# Patient Record
Sex: Female | Born: 1980 | Race: White | Hispanic: No | Marital: Married | Smoking: Never smoker
Health system: Southern US, Community
[De-identification: ages and names within clinical notes are randomized; demographics above are authoritative.]

## PROBLEM LIST (undated history)

## (undated) DIAGNOSIS — Z9289 Personal history of other medical treatment: Secondary | ICD-10-CM

## (undated) DIAGNOSIS — E785 Hyperlipidemia, unspecified: Secondary | ICD-10-CM

## (undated) DIAGNOSIS — L309 Dermatitis, unspecified: Secondary | ICD-10-CM

## (undated) DIAGNOSIS — F419 Anxiety disorder, unspecified: Secondary | ICD-10-CM

## (undated) DIAGNOSIS — R21 Rash and other nonspecific skin eruption: Secondary | ICD-10-CM

## (undated) DIAGNOSIS — D219 Benign neoplasm of connective and other soft tissue, unspecified: Secondary | ICD-10-CM

## (undated) DIAGNOSIS — R000 Tachycardia, unspecified: Secondary | ICD-10-CM

## (undated) DIAGNOSIS — R0602 Shortness of breath: Secondary | ICD-10-CM

## (undated) DIAGNOSIS — N979 Female infertility, unspecified: Secondary | ICD-10-CM

## (undated) DIAGNOSIS — R5383 Other fatigue: Secondary | ICD-10-CM

## (undated) DIAGNOSIS — E559 Vitamin D deficiency, unspecified: Secondary | ICD-10-CM

## (undated) DIAGNOSIS — K219 Gastro-esophageal reflux disease without esophagitis: Secondary | ICD-10-CM

## (undated) DIAGNOSIS — L853 Xerosis cutis: Secondary | ICD-10-CM

## (undated) DIAGNOSIS — H04123 Dry eye syndrome of bilateral lacrimal glands: Secondary | ICD-10-CM

## (undated) DIAGNOSIS — R002 Palpitations: Secondary | ICD-10-CM

## (undated) DIAGNOSIS — R062 Wheezing: Secondary | ICD-10-CM

## (undated) DIAGNOSIS — R7303 Prediabetes: Secondary | ICD-10-CM

## (undated) DIAGNOSIS — R12 Heartburn: Secondary | ICD-10-CM

## (undated) DIAGNOSIS — E119 Type 2 diabetes mellitus without complications: Secondary | ICD-10-CM

## (undated) DIAGNOSIS — F439 Reaction to severe stress, unspecified: Secondary | ICD-10-CM

## (undated) DIAGNOSIS — I10 Essential (primary) hypertension: Secondary | ICD-10-CM

## (undated) DIAGNOSIS — J45909 Unspecified asthma, uncomplicated: Secondary | ICD-10-CM

## (undated) HISTORY — DX: Tachycardia, unspecified: R00.0

## (undated) HISTORY — DX: Vitamin D deficiency, unspecified: E55.9

## (undated) HISTORY — DX: Benign neoplasm of connective and other soft tissue, unspecified: D21.9

## (undated) HISTORY — DX: Rash and other nonspecific skin eruption: R21

## (undated) HISTORY — DX: Dermatitis, unspecified: L30.9

## (undated) HISTORY — DX: Reaction to severe stress, unspecified: F43.9

## (undated) HISTORY — DX: Essential (primary) hypertension: I10

## (undated) HISTORY — DX: Dry eye syndrome of bilateral lacrimal glands: H04.123

## (undated) HISTORY — DX: Heartburn: R12

## (undated) HISTORY — DX: Wheezing: R06.2

## (undated) HISTORY — DX: Palpitations: R00.2

## (undated) HISTORY — DX: Shortness of breath: R06.02

## (undated) HISTORY — DX: Type 2 diabetes mellitus without complications: E11.9

## (undated) HISTORY — DX: Hyperlipidemia, unspecified: E78.5

## (undated) HISTORY — DX: Xerosis cutis: L85.3

## (undated) HISTORY — DX: Female infertility, unspecified: N97.9

## (undated) HISTORY — DX: Unspecified asthma, uncomplicated: J45.909

## (undated) HISTORY — DX: Other fatigue: R53.83

## (undated) HISTORY — DX: Anxiety disorder, unspecified: F41.9

---

## 2004-06-05 HISTORY — PX: EYE SURGERY: SHX253

## 2015-03-03 ENCOUNTER — Telehealth: Payer: Self-pay | Admitting: Behavioral Health

## 2015-03-03 ENCOUNTER — Telehealth: Payer: Self-pay

## 2015-03-03 NOTE — Telephone Encounter (Signed)
Unable to reach patient at time of Pre-Visit Call.  Left message for patient to return call when available.    

## 2015-03-03 NOTE — Telephone Encounter (Signed)
Unable to reach patient for Pre-visit information.

## 2015-03-04 ENCOUNTER — Ambulatory Visit (INDEPENDENT_AMBULATORY_CARE_PROVIDER_SITE_OTHER): Payer: BC Managed Care – PPO | Admitting: Family Medicine

## 2015-03-04 ENCOUNTER — Encounter: Payer: Self-pay | Admitting: Family Medicine

## 2015-03-04 ENCOUNTER — Telehealth: Payer: Self-pay | Admitting: Family Medicine

## 2015-03-04 VITALS — BP 122/88 | HR 100 | Temp 98.3°F | Ht 62.0 in | Wt 275.4 lb

## 2015-03-04 DIAGNOSIS — L309 Dermatitis, unspecified: Secondary | ICD-10-CM | POA: Insufficient documentation

## 2015-03-04 DIAGNOSIS — Z0000 Encounter for general adult medical examination without abnormal findings: Secondary | ICD-10-CM

## 2015-03-04 LAB — POCT URINALYSIS DIPSTICK
Bilirubin, UA: NEGATIVE
GLUCOSE UA: NEGATIVE
Ketones, UA: NEGATIVE
Leukocytes, UA: NEGATIVE
NITRITE UA: NEGATIVE
PH UA: 6.5
PROTEIN UA: NEGATIVE
RBC UA: NEGATIVE
Spec Grav, UA: 1.02
UROBILINOGEN UA: 0.2

## 2015-03-04 MED ORDER — NORGESTIMATE-ETH ESTRADIOL 0.25-35 MG-MCG PO TABS: 1.0000 | ORAL_TABLET | Freq: Every day | ORAL | Status: DC

## 2015-03-04 MED ORDER — PAROXETINE HCL 10 MG PO TABS: 10.0000 mg | ORAL_TABLET | Freq: Every day | ORAL | Status: DC

## 2015-03-04 MED ORDER — TRIAMCINOLONE ACETONIDE 0.1 % EX CREA: 1.0000 "application " | TOPICAL_CREAM | Freq: Two times a day (BID) | CUTANEOUS | Status: DC

## 2015-03-04 MED ORDER — ALBUTEROL SULFATE 108 (90 BASE) MCG/ACT IN AEPB: 2.0000 | INHALATION_SPRAY | Freq: Four times a day (QID) | RESPIRATORY_TRACT | Status: DC | PRN

## 2015-03-04 NOTE — Telephone Encounter (Signed)
Pt said she forgot to tell you she is taking OTC Vitamin D supplement. Takes 1/day. She didn't remember the mg. She started taking a couple years ago based on previous labs.

## 2015-03-04 NOTE — Progress Notes (Signed)
Subjective:     Jeanne [name] is a 34 y.o. female and is here for a comprehensive physical exam. The patient reports no problems.  Social History   Social History  . Marital Status: Married    Spouse Name: N/A  . Number of Children: N/A  . Years of Education: N/A   Occupational History  . Not on file.   Social History Main Topics  . Smoking status: Never Smoker   . Smokeless tobacco: Not on file  . Alcohol Use: No  . Drug Use: No  . Sexual Activity: Not on file   Other Topics Concern  . Not on file   Social History Narrative  . No narrative on file   Health Maintenance  Topic Date Due  . HIV Screening  11/20/1997  . TETANUS/TDAP  11/20/2001  . INFLUENZA VACCINE  01/04/2015  . PAP SMEAR  06/05/2016    The following portions of the patient's history were reviewed and updated as appropriate:  She  has no past medical history on file. She  does not have any pertinent problems on file. She  has past surgical history that includes Eye surgery (Bilateral, 2006). Her family history includes Cancer in her maternal grandfather, maternal grandmother, and paternal grandfather; Deep vein thrombosis in her mother; Fibroids in her mother; Thyroid disease in her mother. She  reports that she has never smoked. She does not have any smokeless tobacco history on file. She reports that she does not drink alcohol or use illicit drugs. She has a current medication list which includes the following prescription(s): albuterol sulfate, cholecalciferol, norgestimate-ethinyl estradiol, paroxetine, and triamcinolone cream. No current outpatient prescriptions on file prior to visit.   No current facility-administered medications on file prior to visit.   She has No Known Allergies..  Review of Systems Review of Systems  Constitutional: Negative for activity change, appetite change and fatigue.  HENT: Negative for hearing loss, congestion, tinnitus and ear discharge.  dentist q53m Eyes:  Negative for visual disturbance (see optho q1y -- vision corrected to 20/20 with glasses).  Respiratory: Negative for cough, chest tightness and shortness of breath.   Cardiovascular: Negative for chest pain, palpitations and leg swelling.  Gastrointestinal: Negative for abdominal pain, diarrhea, constipation and abdominal distention.  Genitourinary: Negative for urgency, frequency, decreased urine volume and difficulty urinating.  Musculoskeletal: Negative for back pain, arthralgias and gait problem.  Skin: Negative for color change, pallor and rash.  Neurological: Negative for dizziness, light-headedness, numbness and headaches.  Hematological: Negative for adenopathy. Does not bruise/bleed easily.  Psychiatric/Behavioral: Negative for suicidal ideas, confusion, sleep disturbance, self-injury, dysphoric mood, decreased concentration and agitation.       Objective:    BP 122/88 mmHg  Pulse 100  Temp(Src) 98.3 F (36.8 C) (Oral)  Ht 5\' 2"  (1.575 m)  Wt 275 lb 6.4 oz (124.921 kg)  BMI 50.36 kg/m2  SpO2 98%  LMP 02/24/2015 General appearance: alert, cooperative, appears stated age and no distress Head: Normocephalic, without obvious abnormality, atraumatic Eyes: conjunctivae/corneas clear. PERRL, EOM's intact. Fundi benign. Ears: normal TM's and external ear canals both ears Nose: Nares normal. Septum midline. Mucosa normal. No drainage or sinus tenderness. Throat: lips, mucosa, and tongue normal; teeth and gums normal Neck: no adenopathy, no carotid bruit, no JVD, supple, symmetrical, trachea midline and thyroid not enlarged, symmetric, no tenderness/mass/nodules Back: symmetric, no curvature. ROM normal. No CVA tenderness. Lungs: clear to auscultation bilaterally Breasts: normal appearance, no masses or tenderness Heart: regular rate and rhythm, S1,  S2 normal, no murmur, click, rub or gallop Abdomen: soft, non-tender; bowel sounds normal; no masses,  no organomegaly Pelvic:  cervix normal in appearance, external genitalia normal, no adnexal masses or tenderness, no cervical motion tenderness, rectovaginal septum normal, uterus normal size, shape, and consistency and vagina normal without discharge Extremities: extremities normal, atraumatic, no cyanosis or edema Pulses: 2+ and symmetric Skin: Skin color, texture, turgor normal. No rashes or lesions Lymph nodes: Cervical, supraclavicular, and axillary nodes normal. Neurologic: Alert and oriented X 3, normal strength and tone. Normal symmetric reflexes. Normal coordination and gait Psych-- no depression, noanxiety      Assessment:    Healthy female exam.      Plan:    ghm utd Check labs See After Visit Summary for Counseling Recommendations    1. Preventative health care See above - CBC with Differential/Platelet - Comprehensive metabolic panel - Lipid panel - TSH - POCT urinalysis dipstick

## 2015-03-04 NOTE — Telephone Encounter (Signed)
Vitamin D added to medication list.

## 2015-03-04 NOTE — Progress Notes (Signed)
Pre visit review using our clinic review tool, if applicable. No additional management support is needed unless otherwise documented below in the visit note. 

## 2015-03-04 NOTE — Patient Instructions (Signed)
Preventive Care for Adults A healthy lifestyle and preventive care can promote health and wellness. Preventive health guidelines for women include the following key practices.  A routine yearly physical is a good way to check with your health care provider about your health and preventive screening. It is a chance to share any concerns and updates on your health and to receive a thorough exam.  Visit your dentist for a routine exam and preventive care every 6 months. Brush your teeth twice a day and floss once a day. Good oral hygiene prevents tooth decay and gum disease.  The frequency of eye exams is based on your age, health, family medical history, use of contact lenses, and other factors. Follow your health care provider's recommendations for frequency of eye exams.  Eat a healthy diet. Foods like vegetables, fruits, whole grains, low-fat dairy products, and lean protein foods contain the nutrients you need without too many calories. Decrease your intake of foods high in solid fats, added sugars, and salt. Eat the right amount of calories for you.Get information about a proper diet from your health care provider, if necessary.  Regular physical exercise is one of the most important things you can do for your health. Most adults should get at least 150 minutes of moderate-intensity exercise (any activity that increases your heart rate and causes you to sweat) each week. In addition, most adults need muscle-strengthening exercises on 2 or more days a week.  Maintain a healthy weight. The body mass index (BMI) is a screening tool to identify possible weight problems. It provides an estimate of body fat based on height and weight. Your health care provider can find your BMI and can help you achieve or maintain a healthy weight.For adults 20 years and older:  A BMI below 18.5 is considered underweight.  A BMI of 18.5 to 24.9 is normal.  A BMI of 25 to 29.9 is considered overweight.  A BMI of  30 and above is considered obese.  Maintain normal blood lipids and cholesterol levels by exercising and minimizing your intake of saturated fat. Eat a balanced diet with plenty of fruit and vegetables. Blood tests for lipids and cholesterol should begin at age 76 and be repeated every 5 years. If your lipid or cholesterol levels are high, you are over 50, or you are at high risk for heart disease, you may need your cholesterol levels checked more frequently.Ongoing high lipid and cholesterol levels should be treated with medicines if diet and exercise are not working.  If you smoke, find out from your health care provider how to quit. If you do not use tobacco, do not start.  Lung cancer screening is recommended for adults aged 22-80 years who are at high risk for developing lung cancer because of a history of smoking. A yearly low-dose CT scan of the lungs is recommended for people who have at least a 30-pack-year history of smoking and are a current smoker or have quit within the past 15 years. A pack year of smoking is smoking an average of 1 pack of cigarettes a day for 1 year (for example: 1 pack a day for 30 years or 2 packs a day for 15 years). Yearly screening should continue until the smoker has stopped smoking for at least 15 years. Yearly screening should be stopped for people who develop a health problem that would prevent them from having lung cancer treatment.  If you are pregnant, do not drink alcohol. If you are breastfeeding,  be very cautious about drinking alcohol. If you are not pregnant and choose to drink alcohol, do not have more than 1 drink per day. One drink is considered to be 12 ounces (355 mL) of beer, 5 ounces (148 mL) of wine, or 1.5 ounces (44 mL) of liquor.  Avoid use of street drugs. Do not share needles with anyone. Ask for help if you need support or instructions about stopping the use of drugs.  High blood pressure causes heart disease and increases the risk of  stroke. Your blood pressure should be checked at least every 1 to 2 years. Ongoing high blood pressure should be treated with medicines if weight loss and exercise do not work.  If you are 75-52 years old, ask your health care provider if you should take aspirin to prevent strokes.  Diabetes screening involves taking a blood sample to check your fasting blood sugar level. This should be done once every 3 years, after age 15, if you are within normal weight and without risk factors for diabetes. Testing should be considered at a younger age or be carried out more frequently if you are overweight and have at least 1 risk factor for diabetes.  Breast cancer screening is essential preventive care for women. You should practice "breast self-awareness." This means understanding the normal appearance and feel of your breasts and may include breast self-examination. Any changes detected, no matter how small, should be reported to a health care provider. Women in their 58s and 30s should have a clinical breast exam (CBE) by a health care provider as part of a regular health exam every 1 to 3 years. After age 16, women should have a CBE every year. Starting at age 53, women should consider having a mammogram (breast X-ray test) every year. Women who have a family history of breast cancer should talk to their health care provider about genetic screening. Women at a high risk of breast cancer should talk to their health care providers about having an MRI and a mammogram every year.  Breast cancer gene (BRCA)-related cancer risk assessment is recommended for women who have family members with BRCA-related cancers. BRCA-related cancers include breast, ovarian, tubal, and peritoneal cancers. Having family members with these cancers may be associated with an increased risk for harmful changes (mutations) in the breast cancer genes BRCA1 and BRCA2. Results of the assessment will determine the need for genetic counseling and  BRCA1 and BRCA2 testing.  Routine pelvic exams to screen for cancer are no longer recommended for nonpregnant women who are considered low risk for cancer of the pelvic organs (ovaries, uterus, and vagina) and who do not have symptoms. Ask your health care provider if a screening pelvic exam is right for you.  If you have had past treatment for cervical cancer or a condition that could lead to cancer, you need Pap tests and screening for cancer for at least 20 years after your treatment. If Pap tests have been discontinued, your risk factors (such as having a new sexual partner) need to be reassessed to determine if screening should be resumed. Some women have medical problems that increase the chance of getting cervical cancer. In these cases, your health care provider may recommend more frequent screening and Pap tests.  The HPV test is an additional test that may be used for cervical cancer screening. The HPV test looks for the virus that can cause the cell changes on the cervix. The cells collected during the Pap test can be  tested for HPV. The HPV test could be used to screen women aged 30 years and older, and should be used in women of any age who have unclear Pap test results. After the age of 30, women should have HPV testing at the same frequency as a Pap test.  Colorectal cancer can be detected and often prevented. Most routine colorectal cancer screening begins at the age of 50 years and continues through age 75 years. However, your health care provider may recommend screening at an earlier age if you have risk factors for colon cancer. On a yearly basis, your health care provider may provide home test kits to check for hidden blood in the stool. Use of a small camera at the end of a tube, to directly examine the colon (sigmoidoscopy or colonoscopy), can detect the earliest forms of colorectal cancer. Talk to your health care provider about this at age 50, when routine screening begins. Direct  exam of the colon should be repeated every 5-10 years through age 75 years, unless early forms of pre-cancerous polyps or small growths are found.  People who are at an increased risk for hepatitis B should be screened for this virus. You are considered at high risk for hepatitis B if:  You were born in a country where hepatitis B occurs often. Talk with your health care provider about which countries are considered high risk.  Your parents were born in a high-risk country and you have not received a shot to protect against hepatitis B (hepatitis B vaccine).  You have HIV or AIDS.  You use needles to inject street drugs.  You live with, or have sex with, someone who has hepatitis B.  You get hemodialysis treatment.  You take certain medicines for conditions like cancer, organ transplantation, and autoimmune conditions.  Hepatitis C blood testing is recommended for all people born from 1945 through 1965 and any individual with known risks for hepatitis C.  Practice safe sex. Use condoms and avoid high-risk sexual practices to reduce the spread of sexually transmitted infections (STIs). STIs include gonorrhea, chlamydia, syphilis, trichomonas, herpes, HPV, and human immunodeficiency virus (HIV). Herpes, HIV, and HPV are viral illnesses that have no cure. They can result in disability, cancer, and death.  You should be screened for sexually transmitted illnesses (STIs) including gonorrhea and chlamydia if:  You are sexually active and are younger than 24 years.  You are older than 24 years and your health care provider tells you that you are at risk for this type of infection.  Your sexual activity has changed since you were last screened and you are at an increased risk for chlamydia or gonorrhea. Ask your health care provider if you are at risk.  If you are at risk of being infected with HIV, it is recommended that you take a prescription medicine daily to prevent HIV infection. This is  called preexposure prophylaxis (PrEP). You are considered at risk if:  You are a heterosexual woman, are sexually active, and are at increased risk for HIV infection.  You take drugs by injection.  You are sexually active with a partner who has HIV.  Talk with your health care provider about whether you are at high risk of being infected with HIV. If you choose to begin PrEP, you should first be tested for HIV. You should then be tested every 3 months for as long as you are taking PrEP.  Osteoporosis is a disease in which the bones lose minerals and strength   with aging. This can result in serious bone fractures or breaks. The risk of osteoporosis can be identified using a bone density scan. Women ages 65 years and over and women at risk for fractures or osteoporosis should discuss screening with their health care providers. Ask your health care provider whether you should take a calcium supplement or vitamin D to reduce the rate of osteoporosis.  Menopause can be associated with physical symptoms and risks. Hormone replacement therapy is available to decrease symptoms and risks. You should talk to your health care provider about whether hormone replacement therapy is right for you.  Use sunscreen. Apply sunscreen liberally and repeatedly throughout the day. You should seek shade when your shadow is shorter than you. Protect yourself by wearing long sleeves, pants, a wide-brimmed hat, and sunglasses year round, whenever you are outdoors.  Once a month, do a whole body skin exam, using a mirror to look at the skin on your back. Tell your health care provider of new moles, moles that have irregular borders, moles that are larger than a pencil eraser, or moles that have changed in shape or color.  Stay current with required vaccines (immunizations).  Influenza vaccine. All adults should be immunized every year.  Tetanus, diphtheria, and acellular pertussis (Td, Tdap) vaccine. Pregnant women should  receive 1 dose of Tdap vaccine during each pregnancy. The dose should be obtained regardless of the length of time since the last dose. Immunization is preferred during the 27th-36th week of gestation. An adult who has not previously received Tdap or who does not know her vaccine status should receive 1 dose of Tdap. This initial dose should be followed by tetanus and diphtheria toxoids (Td) booster doses every 10 years. Adults with an unknown or incomplete history of completing a 3-dose immunization series with Td-containing vaccines should begin or complete a primary immunization series including a Tdap dose. Adults should receive a Td booster every 10 years.  Varicella vaccine. An adult without evidence of immunity to varicella should receive 2 doses or a second dose if she has previously received 1 dose. Pregnant females who do not have evidence of immunity should receive the first dose after pregnancy. This first dose should be obtained before leaving the health care facility. The second dose should be obtained 4-8 weeks after the first dose.  Human papillomavirus (HPV) vaccine. Females aged 13-26 years who have not received the vaccine previously should obtain the 3-dose series. The vaccine is not recommended for use in pregnant females. However, pregnancy testing is not needed before receiving a dose. If a female is found to be pregnant after receiving a dose, no treatment is needed. In that case, the remaining doses should be delayed until after the pregnancy. Immunization is recommended for any person with an immunocompromised condition through the age of 26 years if she did not get any or all doses earlier. During the 3-dose series, the second dose should be obtained 4-8 weeks after the first dose. The third dose should be obtained 24 weeks after the first dose and 16 weeks after the second dose.  Zoster vaccine. One dose is recommended for adults aged 60 years or older unless certain conditions are  present.  Measles, mumps, and rubella (MMR) vaccine. Adults born before 1957 generally are considered immune to measles and mumps. Adults born in 1957 or later should have 1 or more doses of MMR vaccine unless there is a contraindication to the vaccine or there is laboratory evidence of immunity to   each of the three diseases. A routine second dose of MMR vaccine should be obtained at least 28 days after the first dose for students attending postsecondary schools, health care workers, or international travelers. People who received inactivated measles vaccine or an unknown type of measles vaccine during 1963-1967 should receive 2 doses of MMR vaccine. People who received inactivated mumps vaccine or an unknown type of mumps vaccine before 1979 and are at high risk for mumps infection should consider immunization with 2 doses of MMR vaccine. For females of childbearing age, rubella immunity should be determined. If there is no evidence of immunity, females who are not pregnant should be vaccinated. If there is no evidence of immunity, females who are pregnant should delay immunization until after pregnancy. Unvaccinated health care workers born before 1957 who lack laboratory evidence of measles, mumps, or rubella immunity or laboratory confirmation of disease should consider measles and mumps immunization with 2 doses of MMR vaccine or rubella immunization with 1 dose of MMR vaccine.  Pneumococcal 13-valent conjugate (PCV13) vaccine. When indicated, a person who is uncertain of her immunization history and has no record of immunization should receive the PCV13 vaccine. An adult aged 19 years or older who has certain medical conditions and has not been previously immunized should receive 1 dose of PCV13 vaccine. This PCV13 should be followed with a dose of pneumococcal polysaccharide (PPSV23) vaccine. The PPSV23 vaccine dose should be obtained at least 8 weeks after the dose of PCV13 vaccine. An adult aged 19  years or older who has certain medical conditions and previously received 1 or more doses of PPSV23 vaccine should receive 1 dose of PCV13. The PCV13 vaccine dose should be obtained 1 or more years after the last PPSV23 vaccine dose.  Pneumococcal polysaccharide (PPSV23) vaccine. When PCV13 is also indicated, PCV13 should be obtained first. All adults aged 65 years and older should be immunized. An adult younger than age 65 years who has certain medical conditions should be immunized. Any person who resides in a nursing home or long-term care facility should be immunized. An adult smoker should be immunized. People with an immunocompromised condition and certain other conditions should receive both PCV13 and PPSV23 vaccines. People with human immunodeficiency virus (HIV) infection should be immunized as soon as possible after diagnosis. Immunization during chemotherapy or radiation therapy should be avoided. Routine use of PPSV23 vaccine is not recommended for American Indians, [place] Natives, or people younger than 65 years unless there are medical conditions that require PPSV23 vaccine. When indicated, people who have unknown immunization and have no record of immunization should receive PPSV23 vaccine. One-time revaccination 5 years after the first dose of PPSV23 is recommended for people aged 19-64 years who have chronic kidney failure, nephrotic syndrome, asplenia, or immunocompromised conditions. People who received 1-2 doses of PPSV23 before age 65 years should receive another dose of PPSV23 vaccine at age 65 years or later if at least 5 years have passed since the previous dose. Doses of PPSV23 are not needed for people immunized with PPSV23 at or after age 65 years.  Meningococcal vaccine. Adults with asplenia or persistent complement component deficiencies should receive 2 doses of quadrivalent meningococcal conjugate (MenACWY-D) vaccine. The doses should be obtained at least 2 months apart.  Microbiologists working with certain meningococcal bacteria, military recruits, people at risk during an outbreak, and people who travel to or live in countries with a high rate of meningitis should be immunized. A first-year college student up through age   21 years who is living in a residence hall should receive a dose if she did not receive a dose on or after her 16th birthday. Adults who have certain high-risk conditions should receive one or more doses of vaccine.  Hepatitis A vaccine. Adults who wish to be protected from this disease, have certain high-risk conditions, work with hepatitis A-infected animals, work in hepatitis A research labs, or travel to or work in countries with a high rate of hepatitis A should be immunized. Adults who were previously unvaccinated and who anticipate close contact with an international adoptee during the first 60 days after arrival in the [place] from a country with a high rate of hepatitis A should be immunized.  Hepatitis B vaccine. Adults who wish to be protected from this disease, have certain high-risk conditions, may be exposed to blood or other infectious body fluids, are household contacts or sex partners of hepatitis B positive people, are clients or workers in certain care facilities, or travel to or work in countries with a high rate of hepatitis B should be immunized.  Haemophilus influenzae type b (Hib) vaccine. A previously unvaccinated person with asplenia or sickle cell disease or having a scheduled splenectomy should receive 1 dose of Hib vaccine. Regardless of previous immunization, a recipient of a hematopoietic stem cell transplant should receive a 3-dose series 6-12 months after her successful transplant. Hib vaccine is not recommended for adults with HIV infection. Preventive Services / Frequency Ages 64 to 68 years  Blood pressure check.** / Every 1 to 2 years.  Lipid and cholesterol check.** / Every 5 years beginning at age  22.  Clinical breast exam.** / Every 3 years for women in their 88s and 53s.  BRCA-related cancer risk assessment.** / For women who have family members with a BRCA-related cancer (breast, ovarian, tubal, or peritoneal cancers).  Pap test.** / Every 2 years from ages 90 through 51. Every 3 years starting at age 21 through age 56 or 3 with a history of 3 consecutive normal Pap tests.  HPV screening.** / Every 3 years from ages 24 through ages 1 to 46 with a history of 3 consecutive normal Pap tests.  Hepatitis C blood test.** / For any individual with known risks for hepatitis C.  Skin self-exam. / Monthly.  Influenza vaccine. / Every year.  Tetanus, diphtheria, and acellular pertussis (Tdap, Td) vaccine.** / Consult your health care provider. Pregnant women should receive 1 dose of Tdap vaccine during each pregnancy. 1 dose of Td every 10 years.  Varicella vaccine.** / Consult your health care provider. Pregnant females who do not have evidence of immunity should receive the first dose after pregnancy.  HPV vaccine. / 3 doses over 6 months, if 72 and younger. The vaccine is not recommended for use in pregnant females. However, pregnancy testing is not needed before receiving a dose.  Measles, mumps, rubella (MMR) vaccine.** / You need at least 1 dose of MMR if you were born in 1957 or later. You may also need a 2nd dose. For females of childbearing age, rubella immunity should be determined. If there is no evidence of immunity, females who are not pregnant should be vaccinated. If there is no evidence of immunity, females who are pregnant should delay immunization until after pregnancy.  Pneumococcal 13-valent conjugate (PCV13) vaccine.** / Consult your health care provider.  Pneumococcal polysaccharide (PPSV23) vaccine.** / 1 to 2 doses if you smoke cigarettes or if you have certain conditions.  Meningococcal vaccine.** /  1 dose if you are age 19 to 21 years and a first-year college  student living in a residence hall, or have one of several medical conditions, you need to get vaccinated against meningococcal disease. You may also need additional booster doses.  Hepatitis A vaccine.** / Consult your health care provider.  Hepatitis B vaccine.** / Consult your health care provider.  Haemophilus influenzae type b (Hib) vaccine.** / Consult your health care provider. Ages 40 to 64 years  Blood pressure check.** / Every 1 to 2 years.  Lipid and cholesterol check.** / Every 5 years beginning at age 20 years.  Lung cancer screening. / Every year if you are aged 55-80 years and have a 30-pack-year history of smoking and currently smoke or have quit within the past 15 years. Yearly screening is stopped once you have quit smoking for at least 15 years or develop a health problem that would prevent you from having lung cancer treatment.  Clinical breast exam.** / Every year after age 40 years.  BRCA-related cancer risk assessment.** / For women who have family members with a BRCA-related cancer (breast, ovarian, tubal, or peritoneal cancers).  Mammogram.** / Every year beginning at age 40 years and continuing for as long as you are in good health. Consult with your health care provider.  Pap test.** / Every 3 years starting at age 30 years through age 65 or 70 years with a history of 3 consecutive normal Pap tests.  HPV screening.** / Every 3 years from ages 30 years through ages 65 to 70 years with a history of 3 consecutive normal Pap tests.  Fecal occult blood test (FOBT) of stool. / Every year beginning at age 50 years and continuing until age 75 years. You may not need to do this test if you get a colonoscopy every 10 years.  Flexible sigmoidoscopy or colonoscopy.** / Every 5 years for a flexible sigmoidoscopy or every 10 years for a colonoscopy beginning at age 50 years and continuing until age 75 years.  Hepatitis C blood test.** / For all people born from 1945 through  1965 and any individual with known risks for hepatitis C.  Skin self-exam. / Monthly.  Influenza vaccine. / Every year.  Tetanus, diphtheria, and acellular pertussis (Tdap/Td) vaccine.** / Consult your health care provider. Pregnant women should receive 1 dose of Tdap vaccine during each pregnancy. 1 dose of Td every 10 years.  Varicella vaccine.** / Consult your health care provider. Pregnant females who do not have evidence of immunity should receive the first dose after pregnancy.  Zoster vaccine.** / 1 dose for adults aged 60 years or older.  Measles, mumps, rubella (MMR) vaccine.** / You need at least 1 dose of MMR if you were born in 1957 or later. You may also need a 2nd dose. For females of childbearing age, rubella immunity should be determined. If there is no evidence of immunity, females who are not pregnant should be vaccinated. If there is no evidence of immunity, females who are pregnant should delay immunization until after pregnancy.  Pneumococcal 13-valent conjugate (PCV13) vaccine.** / Consult your health care provider.  Pneumococcal polysaccharide (PPSV23) vaccine.** / 1 to 2 doses if you smoke cigarettes or if you have certain conditions.  Meningococcal vaccine.** / Consult your health care provider.  Hepatitis A vaccine.** / Consult your health care provider.  Hepatitis B vaccine.** / Consult your health care provider.  Haemophilus influenzae type b (Hib) vaccine.** / Consult your health care provider. Ages 65   years and over  Blood pressure check.** / Every 1 to 2 years.  Lipid and cholesterol check.** / Every 5 years beginning at age 22 years.  Lung cancer screening. / Every year if you are aged 73-80 years and have a 30-pack-year history of smoking and currently smoke or have quit within the past 15 years. Yearly screening is stopped once you have quit smoking for at least 15 years or develop a health problem that would prevent you from having lung cancer  treatment.  Clinical breast exam.** / Every year after age 4 years.  BRCA-related cancer risk assessment.** / For women who have family members with a BRCA-related cancer (breast, ovarian, tubal, or peritoneal cancers).  Mammogram.** / Every year beginning at age 40 years and continuing for as long as you are in good health. Consult with your health care provider.  Pap test.** / Every 3 years starting at age 9 years through age 34 or 91 years with 3 consecutive normal Pap tests. Testing can be stopped between 65 and 70 years with 3 consecutive normal Pap tests and no abnormal Pap or HPV tests in the past 10 years.  HPV screening.** / Every 3 years from ages 57 years through ages 64 or 45 years with a history of 3 consecutive normal Pap tests. Testing can be stopped between 65 and 70 years with 3 consecutive normal Pap tests and no abnormal Pap or HPV tests in the past 10 years.  Fecal occult blood test (FOBT) of stool. / Every year beginning at age 15 years and continuing until age 17 years. You may not need to do this test if you get a colonoscopy every 10 years.  Flexible sigmoidoscopy or colonoscopy.** / Every 5 years for a flexible sigmoidoscopy or every 10 years for a colonoscopy beginning at age 86 years and continuing until age 71 years.  Hepatitis C blood test.** / For all people born from [birth year] through 1963 and any individual with known risks for hepatitis C.  Osteoporosis screening.** / A one-time screening for women ages 83 years and over and women at risk for fractures or osteoporosis.  Skin self-exam. / Monthly.  Influenza vaccine. / Every year.  Tetanus, diphtheria, and acellular pertussis (Tdap/Td) vaccine.** / 1 dose of Td every 10 years.  Varicella vaccine.** / Consult your health care provider.  Zoster vaccine.** / 1 dose for adults aged 61 years or older.  Pneumococcal 13-valent conjugate (PCV13) vaccine.** / Consult your health care provider.  Pneumococcal  polysaccharide (PPSV23) vaccine.** / 1 dose for all adults aged 28 years and older.  Meningococcal vaccine.** / Consult your health care provider.  Hepatitis A vaccine.** / Consult your health care provider.  Hepatitis B vaccine.** / Consult your health care provider.  Haemophilus influenzae type b (Hib) vaccine.** / Consult your health care provider. ** Family history and personal history of risk and conditions may change your health care provider's recommendations. Document Released: 07/18/2001 Document Revised: 10/06/2013 Document Reviewed: 10/17/2010 [facility] Patient Information 2015 [place], [place]. This information is not intended to replace advice given to you by your health care provider. Make sure you discuss any questions you have with your health care provider.

## 2015-03-05 LAB — LIPID PANEL
Cholesterol: 204 mg/dL — ABNORMAL HIGH (ref 0–200)
HDL: 58.4 mg/dL (ref >39.00)
LDL Cholesterol: 129 mg/dL — ABNORMAL HIGH (ref 0–99)
NONHDL: 145.38
Total CHOL/HDL Ratio: 3
Triglycerides: 83 mg/dL (ref 0.0–149.0)
VLDL: 16.6 mg/dL (ref 0.0–40.0)

## 2015-03-05 LAB — COMPREHENSIVE METABOLIC PANEL
ALK PHOS: 66 U/L (ref 39–117)
ALT: 13 U/L (ref 0–35)
AST: 14 U/L (ref 0–37)
Albumin: 3.9 g/dL (ref 3.5–5.2)
BUN: 9 mg/dL (ref 6–23)
CO2: 24 meq/L (ref 19–32)
Calcium: 9.1 mg/dL (ref 8.4–10.5)
Chloride: 104 mEq/L (ref 96–112)
Creatinine, Ser: 0.61 mg/dL (ref 0.40–1.20)
GFR: 120.59 mL/min (ref >60.00)
GLUCOSE: 75 mg/dL (ref 70–99)
POTASSIUM: 3.9 meq/L (ref 3.5–5.1)
Sodium: 138 mEq/L (ref 135–145)
TOTAL PROTEIN: 7 g/dL (ref 6.0–8.3)
Total Bilirubin: 0.4 mg/dL (ref 0.2–1.2)

## 2015-03-05 LAB — CBC WITH DIFFERENTIAL/PLATELET
BASOS PCT: 0.8 % (ref 0.0–3.0)
Basophils Absolute: 0.1 10*3/uL (ref 0.0–0.1)
EOS PCT: 6.2 % — AB (ref 0.0–5.0)
Eosinophils Absolute: 0.6 10*3/uL (ref 0.0–0.7)
HCT: 42.4 % (ref 36.0–46.0)
Hemoglobin: 14.3 g/dL (ref 12.0–15.0)
LYMPHS ABS: 2.9 10*3/uL (ref 0.7–4.0)
Lymphocytes Relative: 32.8 % (ref 12.0–46.0)
MCHC: 33.6 g/dL (ref 30.0–36.0)
MCV: 93.1 fl (ref 78.0–100.0)
MONOS PCT: 5.7 % (ref 3.0–12.0)
Monocytes Absolute: 0.5 10*3/uL (ref 0.1–1.0)
NEUTROS ABS: 4.9 10*3/uL (ref 1.4–7.7)
NEUTROS PCT: 54.5 % (ref 43.0–77.0)
PLATELETS: 360 10*3/uL (ref 150.0–400.0)
RBC: 4.56 Mil/uL (ref 3.87–5.11)
RDW: 13.1 % (ref 11.5–15.5)
WBC: 8.9 10*3/uL (ref 4.0–10.5)

## 2015-03-05 LAB — TSH: TSH: 2.62 u[IU]/mL (ref 0.35–4.50)

## 2015-06-08 ENCOUNTER — Encounter: Payer: Self-pay | Admitting: Family Medicine

## 2015-06-09 ENCOUNTER — Other Ambulatory Visit: Payer: Self-pay | Admitting: Family Medicine

## 2015-06-09 DIAGNOSIS — J4520 Mild intermittent asthma, uncomplicated: Secondary | ICD-10-CM

## 2015-06-09 MED ORDER — ALBUTEROL SULFATE HFA 108 (90 BASE) MCG/ACT IN AERS: 2.0000 | INHALATION_SPRAY | Freq: Four times a day (QID) | RESPIRATORY_TRACT | Status: DC | PRN

## 2015-06-09 NOTE — Telephone Encounter (Signed)
Please advise if it is ok to switch.      KP 

## 2015-06-21 ENCOUNTER — Telehealth: Payer: Self-pay | Admitting: Family Medicine

## 2015-06-21 ENCOUNTER — Encounter: Payer: Self-pay | Admitting: Medical

## 2015-06-21 ENCOUNTER — Ambulatory Visit (INDEPENDENT_AMBULATORY_CARE_PROVIDER_SITE_OTHER): Payer: BC Managed Care – PPO | Admitting: Medical

## 2015-06-21 VITALS — BP 124/86 | HR 78 | Temp 98.1°F | Ht 62.0 in | Wt 288.0 lb

## 2015-06-21 DIAGNOSIS — R05 Cough: Secondary | ICD-10-CM

## 2015-06-21 DIAGNOSIS — J4531 Mild persistent asthma with (acute) exacerbation: Secondary | ICD-10-CM

## 2015-06-21 DIAGNOSIS — R059 Cough, unspecified: Secondary | ICD-10-CM

## 2015-06-21 MED ORDER — BECLOMETHASONE DIPROPIONATE 40 MCG/ACT IN AERS: 2.0000 | INHALATION_SPRAY | Freq: Two times a day (BID) | RESPIRATORY_TRACT | Status: DC

## 2015-06-21 MED ORDER — METHYLPREDNISOLONE ACETATE 40 MG/ML IJ SUSP
40.0000 mg | Freq: Once | INTRAMUSCULAR | Status: AC
  Administered 2015-06-21: 40 mg via INTRAMUSCULAR

## 2015-06-21 MED ORDER — PREDNISONE 20 MG PO TABS: ORAL_TABLET | ORAL | Status: DC

## 2015-06-21 NOTE — Telephone Encounter (Signed)
Patient called in stating that she was having problems breathing. Transferred to [facility] @ [phone]

## 2015-06-21 NOTE — Progress Notes (Signed)
Pre visit review using our clinic review tool, if applicable. No additional management support is needed unless otherwise documented below in the visit note. 

## 2015-06-21 NOTE — Patient Instructions (Addendum)
For recent asthma flare we gave you depomedrol 40 mg im and 3 day taper oral prednisone.   Start qvar inhaler and use albuterol inhaler if needed.  If you get infectious type symptoms this week notify me and would call in azithromycin antibiotic.  I would consider that cat exposure may be factor in asthma flare.   Follow up in 3 wks or as needed.

## 2015-06-21 NOTE — Progress Notes (Signed)
Subjective:    Patient ID: Jeanne [name], female    DOB: June 21, 1980, 35 y.o.   MRN: [id number]  HPI  Pt in dry cough. More at night. Pt states past 3-4 weeks a lot more coughing and wheezing. Using albuterol 3-4 times a day. Maybe even 4-5 times. Recently mild faint mucous when she coughs. She thinks she does not need antibiotic.  In the fall pt was just using albuterol just one time at night.  No fever, no chills or sweats.   LMP- currently.  Pt states first time every wheezed was couple of years ago.   Recently pt has no sneezing or itching eyes.  Allergies to cat. Now in small apartment.     Review of Systems  Constitutional: Negative for fever, chills and fatigue.  HENT: Positive for congestion. Negative for ear discharge, facial swelling, postnasal drip, rhinorrhea, sinus pressure, sneezing and sore throat.   Respiratory: Positive for cough, shortness of breath and wheezing. Negative for chest tightness.   Cardiovascular: Negative for chest pain and palpitations.  Gastrointestinal: Negative for abdominal pain.  Neurological: Negative for dizziness and headaches.  Hematological: Negative for adenopathy. Does not bruise/bleed easily.  Psychiatric/Behavioral: Negative for behavioral problems and confusion.    No past medical history on file.  Social History   Social History  . Marital Status: Married    Spouse Name: N/A  . Number of Children: N/A  . Years of Education: N/A   Occupational History  .  [employer]    4 th grade   Social History Main Topics  . Smoking status: Never Smoker   . Smokeless tobacco: Not on file  . Alcohol Use: No  . Drug Use: No  . Sexual Activity:    Partners: Male   Other Topics Concern  . Not on file   Social History Narrative   Exercise--- walk in summer--- its been difficult during school year    Past Surgical History  Procedure Laterality Date  . Eye surgery Bilateral 2006    Family History  Problem  Relation Age of Onset  . Thyroid disease Mother   . Fibroids Mother   . Deep vein thrombosis Mother   . Cancer Maternal Grandmother     liver   . Cancer Maternal Grandfather     pancreatic  . Cancer Paternal Grandfather     prostate    No Known Allergies  Current Outpatient Prescriptions on File Prior to Visit  Medication Sig Dispense Refill  . albuterol (PROAIR HFA) 108 (90 Base) MCG/ACT inhaler Inhale 2 puffs into the lungs every 6 (six) hours as needed for wheezing or shortness of breath. 1 Inhaler 5  . Albuterol Sulfate (PROAIR RESPICLICK) 123XX123 (90 BASE) MCG/ACT AEPB Inhale 2 puffs into the lungs 4 (four) times daily as needed. 1 each 3  . Cholecalciferol (VITAMIN D PO) Take 1 tablet by mouth daily.    . norgestimate-ethinyl estradiol (ORTHO-CYCLEN,SPRINTEC,PREVIFEM) 0.25-35 MG-MCG tablet Take 1 tablet by mouth daily. 1 Package 11  . PARoxetine (PAXIL) 10 MG tablet Take 1 tablet (10 mg total) by mouth daily. 30 tablet 5  . triamcinolone cream (KENALOG) 0.1 % Apply 1 application topically 2 (two) times daily. 15 g 3   No current facility-administered medications on file prior to visit.    BP 124/86 mmHg  Pulse 78  Temp(Src) 98.1 F (36.7 C) (Oral)  Ht 5\' 2"  (1.575 m)  Wt 288 lb (130.636 kg)  BMI 52.66 kg/m2  SpO2 96%  Objective:   Physical Exam  General  Mental Status - Alert. General Appearance - Well groomed. Not in acute distress.  Skin Rashes- No Rashes.  HEENT Head- Normal. Ear Auditory Canal - Left- Normal. Right - Normal.Tympanic Membrane- Left- Normal. Right- Normal. Eye Sclera/Conjunctiva- Left- Normal. Right- Normal. Nose & Sinuses Nasal Mucosa- Left-   Mild Boggy and Congested. Right-  Mild Boggy and  Congested.Bilateral no  maxillary and no  frontal sinus pressure. Mouth & Throat Lips: Upper Lip- Normal: no dryness, cracking, pallor, cyanosis, or vesicular eruption. Lower Lip-Normal: no dryness, cracking, pallor, cyanosis or vesicular  eruption. Buccal Mucosa- Bilateral- No Aphthous ulcers. Oropharynx- No Discharge or Erythema. Tonsils: Characteristics- Bilateral- No Erythema or Congestion. Size/Enlargement- Bilateral- No enlargement. Discharge- bilateral-None.  Neck Neck- Supple. No Masses.   Chest and Lung Exam Auscultation: Breath Sounds:- even and unlabored. Mild shallow respirations.  Cardiovascular Auscultation:Rythm- Regular, rate and rhythm. Murmurs & Other Heart Sounds:Ausculatation of the heart reveal- No Murmurs.  Lymphatic Head & Neck General Head & Neck Lymphatics: Bilateral: Description- No Localized lymphadenopathy.       Assessment & Plan:  For recent asthma flare we gave you depomedrol 40 mg im and 3 day taper oral prednisone.   Start qvar inhaler and use albuterol inhaler if needed.  If you get infectious type symptoms this week notify me and would call in azithromycin antibiotic.  I would consider that cat exposure may be factor in asthma flare.    Follow up in 3 wks or as needed.

## 2015-06-29 ENCOUNTER — Encounter: Payer: Self-pay | Admitting: Medical

## 2015-07-12 ENCOUNTER — Ambulatory Visit: Payer: BC Managed Care – PPO | Admitting: Family Medicine

## 2015-07-12 ENCOUNTER — Telehealth: Payer: Self-pay | Admitting: Family Medicine

## 2015-07-12 NOTE — Telephone Encounter (Signed)
No charge---  Block 4 15

## 2015-07-12 NOTE — Telephone Encounter (Signed)
Patient LVM cancelling 4:15pm appointment for today due to work conflict. Charge or no charge

## 2015-07-23 ENCOUNTER — Other Ambulatory Visit: Payer: Self-pay | Admitting: Family Medicine

## 2015-07-23 ENCOUNTER — Encounter: Payer: Self-pay | Admitting: Family Medicine

## 2015-07-23 DIAGNOSIS — H10023 Other mucopurulent conjunctivitis, bilateral: Secondary | ICD-10-CM

## 2015-07-23 MED ORDER — MOXIFLOXACIN HCL 0.5 % OP SOLN: 1.0000 [drp] | Freq: Three times a day (TID) | OPHTHALMIC | Status: DC

## 2015-07-23 NOTE — Telephone Encounter (Signed)
Patient was seen 06/21/15 by Percell Miller with Asthma. Please advise    KP

## 2015-07-23 NOTE — Telephone Encounter (Signed)
Patient calling back checking on her my chart message

## 2015-09-30 ENCOUNTER — Encounter: Payer: Self-pay | Admitting: Medical

## 2015-09-30 ENCOUNTER — Ambulatory Visit (INDEPENDENT_AMBULATORY_CARE_PROVIDER_SITE_OTHER): Payer: BC Managed Care – PPO | Admitting: Medical

## 2015-09-30 VITALS — BP 122/84 | HR 90 | Temp 98.1°F | Ht 62.0 in | Wt 285.0 lb

## 2015-09-30 DIAGNOSIS — H109 Unspecified conjunctivitis: Secondary | ICD-10-CM

## 2015-09-30 DIAGNOSIS — H10023 Other mucopurulent conjunctivitis, bilateral: Secondary | ICD-10-CM | POA: Diagnosis not present

## 2015-09-30 MED ORDER — MOXIFLOXACIN HCL 0.5 % OP SOLN: 1.0000 [drp] | Freq: Three times a day (TID) | OPHTHALMIC | Status: DC

## 2015-09-30 NOTE — Progress Notes (Signed)
Pre visit review using our clinic review tool, if applicable. No additional management support is needed unless otherwise documented below in the visit note. 

## 2015-09-30 NOTE — Progress Notes (Signed)
Subjective:    Patient ID: Jeanne [name], female    DOB: 1980/08/18, 35 y.o.   MRN: [id number]  HPI  Pt woke up at 2:30 in morning. She had matting to rt eye. Lids stuck together. Rt eye red this morning . 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 Pt has had pink eye several times through out her life. Pt has no eye pain. No visual changes.   Pt does not wear contacts.  No eye trauma.  Pt states took while to clean the matting/dc from her eye   Review of Systems  Eyes: Positive for discharge and redness. Negative for photophobia and visual disturbance.  Respiratory: Negative for cough.   Cardiovascular: Negative for chest pain and palpitations.   No past medical history on file.   Social History   Social History  . Marital Status: Married    Spouse Name: N/A  . Number of Children: N/A  . Years of Education: N/A   Occupational History  .  [name]    4 th grade   Social History Main Topics  . Smoking status: Never Smoker   . Smokeless tobacco: Not on file  . Alcohol Use: No  . Drug Use: No  . Sexual Activity:    Partners: Male   Other Topics Concern  . Not on file   Social History Narrative   Exercise--- walk in summer--- its been difficult during school year    Past Surgical History  Procedure Laterality Date  . Eye surgery Bilateral 2006    Family History  Problem Relation Age of Onset  . Thyroid disease Mother   . Fibroids Mother   . Deep vein thrombosis Mother   . Cancer Maternal Grandmother     liver   . Cancer Maternal Grandfather     pancreatic  . Cancer Paternal Grandfather     prostate    No Known Allergies  Current Outpatient Prescriptions on File Prior to Visit  Medication Sig Dispense Refill  . albuterol (PROAIR HFA) 108 (90 Base) MCG/ACT inhaler Inhale 2 puffs into the lungs every 6 (six) hours as needed for wheezing or shortness of breath. 1 Inhaler 5  . Albuterol Sulfate (PROAIR RESPICLICK) 123XX123 (90 BASE) MCG/ACT AEPB Inhale 2 puffs  into the lungs 4 (four) times daily as needed. 1 each 3  . beclomethasone (QVAR) 40 MCG/ACT inhaler Inhale 2 puffs into the lungs 2 (two) times daily. 1 Inhaler 1  . Cholecalciferol (VITAMIN D PO) Take 1 tablet by mouth daily.    [name] moxifloxacin (VIGAMOX) 0.5 % ophthalmic solution Place 1 drop into both eyes 3 (three) times daily. 3 mL 0  . norgestimate-ethinyl estradiol (ORTHO-CYCLEN,SPRINTEC,PREVIFEM) 0.25-35 MG-MCG tablet Take 1 tablet by mouth daily. 1 Package 11  . PARoxetine (PAXIL) 10 MG tablet Take 1 tablet (10 mg total) by mouth daily. 30 tablet 5  . triamcinolone cream (KENALOG) 0.1 % Apply 1 application topically 2 (two) times daily. 15 g 3   No current facility-administered medications on file prior to visit.    BP 122/84 mmHg  Pulse 90  Temp(Src) 98.1 F (36.7 C) (Oral)  Ht 5\' 2"  (1.575 m)  Wt 285 lb (129.275 kg)  BMI 52.11 kg/m2  SpO2 98%  LMP 09/09/2015       Objective:   Physical Exam   General  Mental Status - Alert. General Appearance - Well groomed. Not in acute distress.  Skin Rashes- No Rashes.  Eye Sclera/Conjunctiva- Left- injected red conjunctiva.(no discharge presently) Right-  Normal.  Neck Neck- Supple. No Masses.   Chest and Lung Exam Auscultation: Breath Sounds:-Clear even and unlabored.  Cardiovascular Auscultation:Rythm- Regular, rate and rhythm. Murmurs & Other Heart Sounds:Ausculatation of the heart reveal- No Murmurs.  Lymphatic Head & Neck General Head & Neck Lymphatics: Bilateral: Description- No Localized lymphadenopathy.      Assessment & Plan:  You do appear to have conjunctivitis by exam and by your history of thick matting. Will rx vigamox. If eye worsens by tomorrow then would try to get you in with optometrist before the weekend.  Work not until this coming Monday. If eye clear then can return to work.  Follow up in 4-5 days or as needed  Jeanne [name], Jeanne [name], Jeanne [name]

## 2015-09-30 NOTE — Patient Instructions (Signed)
You do appear to have conjunctivitis by exam and by your history of thick matting. Will rx vigamox. If eye worsens by tomorrow then would try to get you in with optometrist before the weekend.  Work not until this coming Monday. If eye clear then can return to work.  Follow up in 4-5 days or as needed

## 2015-10-08 ENCOUNTER — Other Ambulatory Visit: Payer: Self-pay | Admitting: Family Medicine

## 2016-01-07 ENCOUNTER — Telehealth: Payer: Self-pay | Admitting: Family Medicine

## 2016-01-07 NOTE — Telephone Encounter (Signed)
Pt has appt w/ Mackie Pai, PA-C on Monday 01/10/16.

## 2016-01-07 NOTE — Telephone Encounter (Signed)
[facility]  Patient Name: Jeanne [name]  DOB: 1980-07-21    Initial Comment Caller states she was unable to get filling at dentist due to high bp, 161/108, wants to know if she needs to be seen immediately or if appt can wait   Nurse Assessment  Nurse: Harlow Mares, RN, Suanne Marker Date/Time (Eastern Time): 01/07/2016 1:51:39 PM  Confirm and document reason for call. If symptomatic, describe symptoms. You must click the next button to save text entered. ---Caller states she was unable to get filling at dentist due to high bp, 161/108, wants to know if she needs to be seen immediately or if appt can wait. Reports that she used to take something for her bp and she stopped taking. Reports that she has no symptoms today but had headaches last week.  Has the patient traveled out of the country within the last 30 days? ---Not Applicable  Does the patient have any new or worsening symptoms? ---Yes  Will a triage be completed? ---Yes  Related visit to physician within the last 2 weeks? ---No  Does the PT have any chronic conditions? (i.e. diabetes, asthma, etc.) ---Yes  List chronic conditions. ---HTN, asthma  Is the patient pregnant or possibly pregnant? (Ask all females between the ages of 27-55) ---No  Is this a behavioral health or substance abuse call? ---No     Guidelines    Guideline Title Affirmed Question Affirmed Notes  High Blood Pressure [1] BP ? 140/90 AND [2] not taking BP medications    Final Disposition User   See PCP within [place], RN, Suanne Marker    Comments  Caller scheduled for MD appt on 01/10/16 at 10am with Mackie Pai, PA at the [facility] office. Caller voiced understanding.   Referrals  REFERRED TO PCP OFFICE   Disagree/Comply: Comply

## 2016-01-10 ENCOUNTER — Encounter: Payer: Self-pay | Admitting: Medical

## 2016-01-10 ENCOUNTER — Ambulatory Visit (INDEPENDENT_AMBULATORY_CARE_PROVIDER_SITE_OTHER): Payer: BC Managed Care – PPO | Admitting: Medical

## 2016-01-10 VITALS — BP 130/86 | HR 77 | Temp 98.3°F | Ht 62.0 in | Wt 289.4 lb

## 2016-01-10 DIAGNOSIS — I10 Essential (primary) hypertension: Secondary | ICD-10-CM

## 2016-01-10 LAB — COMPREHENSIVE METABOLIC PANEL
ALBUMIN: 3.8 g/dL (ref 3.5–5.2)
ALK PHOS: 62 U/L (ref 39–117)
ALT: 13 U/L (ref 0–35)
AST: 13 U/L (ref 0–37)
BUN: 10 mg/dL (ref 6–23)
CALCIUM: 9.3 mg/dL (ref 8.4–10.5)
CHLORIDE: 105 meq/L (ref 96–112)
CO2: 25 mEq/L (ref 19–32)
Creatinine, Ser: 0.61 mg/dL (ref 0.40–1.20)
GFR: 119.96 mL/min (ref >60.00)
Glucose, Bld: 100 mg/dL — ABNORMAL HIGH (ref 70–99)
POTASSIUM: 4 meq/L (ref 3.5–5.1)
SODIUM: 138 meq/L (ref 135–145)
TOTAL PROTEIN: 7 g/dL (ref 6.0–8.3)
Total Bilirubin: 0.3 mg/dL (ref 0.2–1.2)

## 2016-01-10 LAB — CBC WITH DIFFERENTIAL/PLATELET
BASOS ABS: 0.1 10*3/uL (ref 0.0–0.1)
Basophils Relative: 0.6 % (ref 0.0–3.0)
EOS ABS: 0.8 10*3/uL — AB (ref 0.0–0.7)
Eosinophils Relative: 8.3 % — ABNORMAL HIGH (ref 0.0–5.0)
HCT: 42.1 % (ref 36.0–46.0)
Hemoglobin: 14.5 g/dL (ref 12.0–15.0)
LYMPHS ABS: 2.5 10*3/uL (ref 0.7–4.0)
Lymphocytes Relative: 25.8 % (ref 12.0–46.0)
MCHC: 34.3 g/dL (ref 30.0–36.0)
MCV: 90.5 fl (ref 78.0–100.0)
MONO ABS: 0.6 10*3/uL (ref 0.1–1.0)
MONOS PCT: 5.9 % (ref 3.0–12.0)
NEUTROS ABS: 5.9 10*3/uL (ref 1.4–7.7)
NEUTROS PCT: 59.4 % (ref 43.0–77.0)
PLATELETS: 310 10*3/uL (ref 150.0–400.0)
RBC: 4.65 Mil/uL (ref 3.87–5.11)
RDW: 13.4 % (ref 11.5–15.5)
WBC: 9.9 10*3/uL (ref 4.0–10.5)

## 2016-01-10 MED ORDER — HYDROCHLOROTHIAZIDE 12.5 MG PO TABS: 12.5000 mg | ORAL_TABLET | Freq: Every day | ORAL | 1 refills | Status: DC

## 2016-01-10 NOTE — Progress Notes (Signed)
   Subjective:    Patient ID: Jeanne [name], female    DOB: 1980-10-24, 35 y.o.   MRN: [id number]  HPI  Pt in for follow up on her bp check. Pt states on bp check from dentist she had mild high bp check(was about 130/90). But then at dentist before filling was 173/117. Then later 161/108.   In the past pt bp was high in 2010. Pt states in past with stress reduction and weight loss her bp came down and she got off med. But recently a lot of stress at work. And her weight has gone back up over the years.  LMP- about 2 weeks. (pt is on ocp)  Pt was with different doctor in [place].  Pt just 2 weeks ago. Started exercising and joined gym. She is eating  Better recently.   Review of Systems  Constitutional: Negative for chills, fatigue and fever.  HENT: Negative for congestion and ear discharge.   Respiratory: Negative for cough, choking, shortness of breath and wheezing.   Cardiovascular: Negative for chest pain and palpitations.  Gastrointestinal: Negative for abdominal pain.  Skin: Negative for rash.  Allergic/Immunologic: Negative for environmental allergies and immunocompromised state.  Neurological: Negative for dizziness and headaches.  Hematological: Negative for adenopathy. Does not bruise/bleed easily.  Psychiatric/Behavioral: Negative for behavioral problems and confusion.       Objective:   Physical Exam  General Mental Status- Alert. General Appearance- Not in acute distress.   Skin General: Color- Normal Color. Moisture- Normal Moisture.  Neck Carotid Arteries- Normal color. Moisture- Normal Moisture. No carotid bruits. No JVD.  Chest and Lung Exam Auscultation: Breath Sounds:-Normal.  Cardiovascular Auscultation:Rythm- Regular. Murmurs & Other Heart Sounds:Auscultation of the heart reveals- No Murmurs.   Neurologic Cranial Nerve exam:- CN III-XII intact(No nystagmus), symmetric smile. Drift Test:- No drift. Finger to Nose:- Normal/Intact Strength:- 5/5  equal and symmetric strength both upper and lower extremities.      Assessment & Plan:  Your bp is borderline today but you have hx of htn. Recent highs at dentist.   Please get cbc and cmp today.  Get otc wrist monitor and check bp daily. If your readings are over 140/90 then start hctz.  You just started diet and exercise program. Reminder low salt diet as well.  Follow up in 2 weeks or as needed K[place]anda [place], Percell Miller, [employer]

## 2016-01-10 NOTE — Patient Instructions (Addendum)
Your bp is borderline today but you have hx of htn. Recent highs at dentist.   Please get cbc and cmp today.  Get otc wrist monitor and check bp daily. If your readings are over 140/90 then start hctz.  You just started diet and exercise program. Reminder low salt diet as well.  Follow up in 2 weeks or as needed  DASH Eating Plan DASH stands for "Dietary Approaches to Stop Hypertension." The DASH eating plan is a healthy eating plan that has been shown to reduce high blood pressure (hypertension). Additional health benefits may include reducing the risk of type 2 diabetes mellitus, heart disease, and stroke. The DASH eating plan may also help with weight loss. WHAT DO I NEED TO KNOW ABOUT THE DASH EATING PLAN? For the DASH eating plan, you will follow these general guidelines:  Choose foods with a percent daily value for sodium of less than 5% (as listed on the food label).  Use salt-free seasonings or herbs instead of table salt or sea salt.  Check with your health care provider or pharmacist before using salt substitutes.  Eat lower-sodium products, often labeled as "lower sodium" or "no salt added."  Eat fresh foods.  Eat more vegetables, fruits, and low-fat dairy products.  Choose whole grains. Look for the word "whole" as the first word in the ingredient list.  Choose fish and skinless chicken or [place] more often than red meat. Limit fish, poultry, and meat to 6 oz (170 g) each day.  Limit sweets, desserts, sugars, and sugary drinks.  Choose heart-healthy fats.  Limit cheese to 1 oz (28 g) per day.  Eat more home-cooked food and less restaurant, buffet, and fast food.  Limit fried foods.  Cook foods using methods other than frying.  Limit canned vegetables. If you do use them, rinse them well to decrease the sodium.  When eating at a restaurant, ask that your food be prepared with less salt, or no salt if possible. WHAT FOODS CAN I EAT? Seek help from a dietitian  for individual calorie needs. Grains Whole grain or whole wheat bread. Brown rice. Whole grain or whole wheat pasta. Quinoa, bulgur, and whole grain cereals. Low-sodium cereals. Corn or whole wheat flour tortillas. Whole grain cornbread. Whole grain crackers. Low-sodium crackers. Vegetables Fresh or frozen vegetables (raw, steamed, roasted, or grilled). Low-sodium or reduced-sodium tomato and vegetable juices. Low-sodium or reduced-sodium tomato sauce and paste. Low-sodium or reduced-sodium canned vegetables.  Fruits All fresh, canned (in natural juice), or frozen fruits. Meat and Other Protein Products Ground beef (85% or leaner), grass-fed beef, or beef trimmed of fat. Skinless chicken or [place]. Ground chicken or [place]. Pork trimmed of fat. All fish and seafood. Eggs. Dried beans, peas, or lentils. Unsalted nuts and seeds. Unsalted canned beans. Dairy Low-fat dairy products, such as skim or 1% milk, 2% or reduced-fat cheeses, low-fat ricotta or cottage cheese, or plain low-fat yogurt. Low-sodium or reduced-sodium cheeses. Fats and Oils Tub margarines without trans fats. Light or reduced-fat mayonnaise and salad dressings (reduced sodium). Avocado. Safflower, olive, or canola oils. Natural peanut or almond butter. Other Unsalted popcorn and pretzels. The items listed above may not be a complete list of recommended foods or beverages. Contact your dietitian for more options. WHAT FOODS ARE NOT RECOMMENDED? Grains White bread. White pasta. White rice. Refined cornbread. Bagels and croissants. Crackers that contain trans fat. Vegetables Creamed or fried vegetables. Vegetables in a cheese sauce. Regular canned vegetables. Regular canned tomato sauce and paste.  Regular tomato and vegetable juices. Fruits Dried fruits. Canned fruit in light or heavy syrup. Fruit juice. Meat and Other Protein Products Fatty cuts of meat. Ribs, chicken wings, bacon, sausage, bologna, salami, chitterlings, fatback,  hot dogs, bratwurst, and packaged luncheon meats. Salted nuts and seeds. Canned beans with salt. Dairy Whole or 2% milk, cream, half-and-half, and cream cheese. Whole-fat or sweetened yogurt. Full-fat cheeses or blue cheese. Nondairy creamers and whipped toppings. Processed cheese, cheese spreads, or cheese curds. Condiments Onion and garlic salt, seasoned salt, table salt, and sea salt. Canned and packaged gravies. Worcestershire sauce. Tartar sauce. Barbecue sauce. Teriyaki sauce. Soy sauce, including reduced sodium. Steak sauce. Fish sauce. Oyster sauce. Cocktail sauce. Horseradish. Ketchup and mustard. Meat flavorings and tenderizers. Bouillon cubes. Hot sauce. Tabasco sauce. Marinades. Taco seasonings. Relishes. Fats and Oils Butter, stick margarine, lard, shortening, ghee, and bacon fat. Coconut, palm kernel, or palm oils. Regular salad dressings. Other Pickles and olives. Salted popcorn and pretzels. The items listed above may not be a complete list of foods and beverages to avoid. Contact your dietitian for more information. WHERE CAN I FIND MORE INFORMATION? National Heart, Lung, and Blood Institute: travelstabloid.com   This information is not intended to replace advice given to you by your health care provider. Make sure you discuss any questions you have with your health care provider.   Document Released: 05/11/2011 Document Revised: 06/12/2014 Document Reviewed: 03/26/2013 Elsevier Interactive Patient Education [employer].

## 2016-01-10 NOTE — Progress Notes (Signed)
Pre visit review using our clinic review tool, if applicable. No additional management support is needed unless otherwise documented below in the visit note./[facility]SM  

## 2016-01-11 NOTE — Progress Notes (Signed)
Pt has seen results on MyChart and message also sent for patient to call back if any questions.

## 2016-01-25 ENCOUNTER — Ambulatory Visit (INDEPENDENT_AMBULATORY_CARE_PROVIDER_SITE_OTHER): Payer: BC Managed Care – PPO | Admitting: Family Medicine

## 2016-01-25 ENCOUNTER — Encounter: Payer: Self-pay | Admitting: Family Medicine

## 2016-01-25 VITALS — BP 140/90 | HR 87 | Temp 99.0°F | Wt 289.0 lb

## 2016-01-25 DIAGNOSIS — I10 Essential (primary) hypertension: Secondary | ICD-10-CM | POA: Insufficient documentation

## 2016-01-25 MED ORDER — HYDROCHLOROTHIAZIDE 25 MG PO TABS: 25.0000 mg | ORAL_TABLET | Freq: Every day | ORAL | 11 refills | Status: DC

## 2016-01-25 MED ORDER — TRIAMCINOLONE ACETONIDE 0.1 % EX CREA: 1.0000 "application " | TOPICAL_CREAM | Freq: Two times a day (BID) | CUTANEOUS | 3 refills | Status: DC

## 2016-01-25 NOTE — Patient Instructions (Signed)
Hypertension Hypertension, commonly called high blood pressure, is when the force of blood pumping through your arteries is too strong. Your arteries are the blood vessels that carry blood from your heart throughout your body. A blood pressure reading consists of a higher number over a lower number, such as 110/72. The higher number (systolic) is the pressure inside your arteries when your heart pumps. The lower number (diastolic) is the pressure inside your arteries when your heart relaxes. Ideally you want your blood pressure below 120/80. Hypertension forces your heart to work harder to pump blood. Your arteries may become narrow or stiff. Having untreated or uncontrolled hypertension can cause heart attack, stroke, kidney disease, and other problems. RISK FACTORS Some risk factors for high blood pressure are controllable. Others are not.  Risk factors you cannot control include:   Race. You may be at higher risk if you are African American.  Age. Risk increases with age.  Gender. Men are at higher risk than women before age 45 years. After age 65, women are at higher risk than men. Risk factors you can control include:  Not getting enough exercise or physical activity.  Being overweight.  Getting too much fat, sugar, calories, or salt in your diet.  Drinking too much alcohol. SIGNS AND SYMPTOMS Hypertension does not usually cause signs or symptoms. Extremely high blood pressure (hypertensive crisis) may cause headache, anxiety, shortness of breath, and nosebleed. DIAGNOSIS To check if you have hypertension, your health care provider will measure your blood pressure while you are seated, with your arm held at the level of your heart. It should be measured at least twice using the same arm. Certain conditions can cause a difference in blood pressure between your right and left arms. A blood pressure reading that is higher than normal on one occasion does not mean that you need treatment. If  it is not clear whether you have high blood pressure, you may be asked to return on a different day to have your blood pressure checked again. Or, you may be asked to monitor your blood pressure at home for 1 or more weeks. TREATMENT Treating high blood pressure includes making lifestyle changes and possibly taking medicine. Living a healthy lifestyle can help lower high blood pressure. You may need to change some of your habits. Lifestyle changes may include:  Following the DASH diet. This diet is high in fruits, vegetables, and whole grains. It is low in salt, red meat, and added sugars.  Keep your sodium intake below 2,300 mg per day.  Getting at least 30-45 minutes of aerobic exercise at least 4 times per week.  Losing weight if necessary.  Not smoking.  Limiting alcoholic beverages.  Learning ways to reduce stress. Your health care provider may prescribe medicine if lifestyle changes are not enough to get your blood pressure under control, and if one of the following is true:  You are 18-59 years of age and your systolic blood pressure is above 140.  You are 60 years of age or older, and your systolic blood pressure is above 150.  Your diastolic blood pressure is above 90.  You have diabetes, and your systolic blood pressure is over 140 or your diastolic blood pressure is over 90.  You have kidney disease and your blood pressure is above 140/90.  You have heart disease and your blood pressure is above 140/90. Your personal target blood pressure may vary depending on your medical conditions, your age, and other factors. HOME CARE INSTRUCTIONS    Have your blood pressure rechecked as directed by your health care provider.   Take medicines only as directed by your health care provider. Follow the directions carefully. Blood pressure medicines must be taken as prescribed. The medicine does not work as well when you skip doses. Skipping doses also puts you at risk for  problems.  Do not smoke.   Monitor your blood pressure at home as directed by your health care provider. SEEK MEDICAL CARE IF:   You think you are having a reaction to medicines taken.  You have recurrent headaches or feel dizzy.  You have swelling in your ankles.  You have trouble with your vision. SEEK IMMEDIATE MEDICAL CARE IF:  You develop a severe headache or confusion.  You have unusual weakness, numbness, or feel faint.  You have severe chest or abdominal pain.  You vomit repeatedly.  You have trouble breathing. MAKE SURE YOU:   Understand these instructions.  Will watch your condition.  Will get help right away if you are not doing well or get worse.   This information is not intended to replace advice given to you by your health care provider. Make sure you discuss any questions you have with your health care provider.   Document Released: 05/22/2005 Document Revised: 10/06/2014 Document Reviewed: 03/14/2013 Elsevier Interactive Patient Education 2016 [employer]  

## 2016-01-25 NOTE — Progress Notes (Signed)
Patient ID: Jeanne [name], female    DOB: Oct 25, 1980  Age: 35 y.o. MRN: [id number]    Subjective:  Subjective  HPI Jeanne [name] presents for f/u bp-- she started hctz 1 week ago.    Review of Systems  Constitutional: Negative for appetite change, diaphoresis, fatigue and unexpected weight change.  Eyes: Negative for pain, redness and visual disturbance.  Respiratory: Negative for cough, chest tightness, shortness of breath and wheezing.   Cardiovascular: Negative for chest pain, palpitations and leg swelling.  Endocrine: Negative for cold intolerance, heat intolerance, polydipsia, polyphagia and polyuria.  Genitourinary: Negative for difficulty urinating, dysuria and frequency.  Neurological: Negative for dizziness, light-headedness, numbness and headaches.    History No past medical history on file.  She has a past surgical history that includes Eye surgery (Bilateral, 2006).   Her family history includes Cancer in her maternal grandfather, maternal grandmother, and paternal grandfather; Deep vein thrombosis in her mother; Fibroids in her mother; Thyroid disease in her mother.She reports that she has never smoked. She does not have any smokeless tobacco history on file. She reports that she does not drink alcohol or use drugs.  Current Outpatient Prescriptions on File Prior to Visit  Medication Sig Dispense Refill  . albuterol (PROAIR HFA) 108 (90 Base) MCG/ACT inhaler Inhale 2 puffs into the lungs every 6 (six) hours as needed for wheezing or shortness of breath. 1 Inhaler 5  . Albuterol Sulfate (PROAIR RESPICLICK) 123XX123 (90 BASE) MCG/ACT AEPB Inhale 2 puffs into the lungs 4 (four) times daily as needed. 1 each 3  . Cholecalciferol (VITAMIN D PO) Take 1 tablet by mouth daily.    [name] moxifloxacin (VIGAMOX) 0.5 % ophthalmic solution Place 1 drop into both eyes 3 (three) times daily. 3 mL 0  . norgestimate-ethinyl estradiol (ORTHO-CYCLEN,SPRINTEC,PREVIFEM) 0.25-35 MG-MCG tablet Take 1  tablet by mouth daily. 1 Package 11  . PARoxetine (PAXIL) 10 MG tablet TAKE 1 TABLET (10 MG TOTAL) BY MOUTH DAILY. 30 tablet 3   No current facility-administered medications on file prior to visit.      Objective:  Objective  Physical Exam  Constitutional: She is oriented to person, place, and time. She appears well-developed and well-nourished.  HENT:  Head: Normocephalic and atraumatic.  Eyes: Conjunctivae and EOM are normal.  Neck: Normal range of motion. Neck supple. No JVD present. Carotid bruit is not present. No thyromegaly present.  Cardiovascular: Normal rate, regular rhythm and normal heart sounds.   No murmur heard. Pulmonary/Chest: Effort normal and breath sounds normal. No respiratory distress. She has no wheezes. She has no rales. She exhibits no tenderness.  Musculoskeletal: She exhibits no edema.  Neurological: She is alert and oriented to person, place, and time.  Psychiatric: She has a normal mood and affect.  Nursing note and vitals reviewed.  BP 140/90 (BP Location: Left Arm, Patient Position: Sitting, Cuff Size: Large)   Pulse 87   Temp 99 F (37.2 C) (Oral)   Wt 289 lb (131.1 kg)   LMP 12/27/2015   SpO2 96%   BMI 52.86 kg/m  Wt Readings from Last 3 Encounters:  01/25/16 289 lb (131.1 kg)  01/10/16 289 lb 6.4 oz (131.3 kg)  09/30/15 285 lb (129.3 kg)     Lab Results  Component Value Date   WBC 9.9 01/10/2016   HGB 14.5 01/10/2016   HCT 42.1 01/10/2016   PLT 310.0 01/10/2016   GLUCOSE 100 (H) 01/10/2016   CHOL 204 (H) 03/04/2015   TRIG 83.0 03/04/2015  HDL 58.40 03/04/2015   LDLCALC 129 (H) 03/04/2015   ALT 13 01/10/2016   AST 13 01/10/2016   NA 138 01/10/2016   K 4.0 01/10/2016   CL 105 01/10/2016   CREATININE 0.61 01/10/2016   BUN 10 01/10/2016   CO2 25 01/10/2016   TSH 2.62 03/04/2015    Patient was never admitted.   Assessment & Plan:  Plan  I have discontinued Jeanne [name]'s beclomethasone and hydrochlorothiazide. I am also  having her start on hydrochlorothiazide. Additionally, I am having her maintain her norgestimate-ethinyl estradiol, Albuterol Sulfate, Cholecalciferol (VITAMIN D PO), albuterol, moxifloxacin, PARoxetine, and triamcinolone cream.  Meds ordered this encounter  Medications  . triamcinolone cream (KENALOG) 0.1 %    Sig: Apply 1 application topically 2 (two) times daily.    Dispense:  15 g    Refill:  3  . hydrochlorothiazide (HYDRODIURIL) 25 MG tablet    Sig: Take 1 tablet (25 mg total) by mouth daily. 1 po qd prn    Dispense:  30 tablet    Refill:  11    Problem List Items Addressed This Visit    None    Visit Diagnoses    Essential hypertension    -  Primary   Relevant Medications   hydrochlorothiazide (HYDRODIURIL) 25 MG tablet   Other Relevant Orders   Basic metabolic panel    dash diet ,  rto 3-4 weeks  Follow-up: Return in about 3 weeks (around 02/15/2016) for hypertension.  Ann Held, DO

## 2016-01-25 NOTE — Progress Notes (Signed)
Pre visit review using our clinic review tool, if applicable. No additional management support is needed unless otherwise documented below in the visit note. 

## 2016-02-12 ENCOUNTER — Other Ambulatory Visit: Payer: Self-pay | Admitting: Family Medicine

## 2016-02-15 ENCOUNTER — Other Ambulatory Visit: Payer: Self-pay

## 2016-02-15 ENCOUNTER — Ambulatory Visit: Payer: Self-pay | Admitting: Family Medicine

## 2016-02-16 ENCOUNTER — Encounter: Payer: Self-pay | Admitting: Family Medicine

## 2016-03-06 ENCOUNTER — Encounter: Payer: Self-pay | Admitting: Family Medicine

## 2016-03-16 ENCOUNTER — Telehealth: Payer: Self-pay | Admitting: Family Medicine

## 2016-03-16 NOTE — Telephone Encounter (Signed)
Patient received a no show letter for 02/15/16. 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 Charge or no charge?

## 2016-03-17 NOTE — Telephone Encounter (Signed)
No charge. 

## 2016-03-17 NOTE — Telephone Encounter (Signed)
Patient aware.

## 2016-03-17 NOTE — Telephone Encounter (Signed)
Please advise      KP 

## 2016-03-22 ENCOUNTER — Other Ambulatory Visit: Payer: Self-pay | Admitting: Family Medicine

## 2016-03-31 ENCOUNTER — Other Ambulatory Visit (HOSPITAL_COMMUNITY)
Admission: RE | Admit: 2016-03-31 | Discharge: 2016-03-31 | Disposition: A | Discharge status: Home / Self Care | Payer: BC Managed Care – PPO | Source: Ambulatory Visit | Attending: Family Medicine | Admitting: Family Medicine

## 2016-03-31 ENCOUNTER — Encounter: Payer: Self-pay | Admitting: Family Medicine

## 2016-03-31 ENCOUNTER — Ambulatory Visit (INDEPENDENT_AMBULATORY_CARE_PROVIDER_SITE_OTHER): Payer: BC Managed Care – PPO | Admitting: Family Medicine

## 2016-03-31 VITALS — BP 130/78 | HR 96 | Temp 98.5°F | Resp 16 | Ht 62.0 in | Wt 276.6 lb

## 2016-03-31 DIAGNOSIS — Z0000 Encounter for general adult medical examination without abnormal findings: Secondary | ICD-10-CM

## 2016-03-31 DIAGNOSIS — Z01419 Encounter for gynecological examination (general) (routine) without abnormal findings: Secondary | ICD-10-CM | POA: Diagnosis not present

## 2016-03-31 NOTE — Patient Instructions (Signed)
Preventive Care for Adults, Female A healthy lifestyle and preventive care can promote health and wellness. Preventive health guidelines for women include the following key practices.  A routine yearly physical is a good way to check with your health care provider about your health and preventive screening. It is a chance to share any concerns and updates on your health and to receive a thorough exam.  Visit your dentist for a routine exam and preventive care every 6 months. Brush your teeth twice a day and floss once a day. Good oral hygiene prevents tooth decay and gum disease.  The frequency of eye exams is based on your age, health, family medical history, use of contact lenses, and other factors. Follow your health care provider's recommendations for frequency of eye exams.  Eat a healthy diet. Foods like vegetables, fruits, whole grains, low-fat dairy products, and lean protein foods contain the nutrients you need without too many calories. Decrease your intake of foods high in solid fats, added sugars, and salt. Eat the right amount of calories for you.Get information about a proper diet from your health care provider, if necessary.  Regular physical exercise is one of the most important things you can do for your health. Most adults should get at least 150 minutes of moderate-intensity exercise (any activity that increases your heart rate and causes you to sweat) each week. In addition, most adults need muscle-strengthening exercises on 2 or more days a week.  Maintain a healthy weight. The body mass index (BMI) is a screening tool to identify possible weight problems. It provides an estimate of body fat based on height and weight. Your health care provider can find your BMI and can help you achieve or maintain a healthy weight.For adults 20 years and older:  A BMI below 18.5 is considered underweight.  A BMI of 18.5 to 24.9 is normal.  A BMI of 25 to 29.9 is considered overweight.  A  BMI of 30 and above is considered obese.  Maintain normal blood lipids and cholesterol levels by exercising and minimizing your intake of saturated fat. Eat a balanced diet with plenty of fruit and vegetables. Blood tests for lipids and cholesterol should begin at age 45 and be repeated every 5 years. If your lipid or cholesterol levels are high, you are over 50, or you are at high risk for heart disease, you may need your cholesterol levels checked more frequently.Ongoing high lipid and cholesterol levels should be treated with medicines if diet and exercise are not working.  If you smoke, find out from your health care provider how to quit. If you do not use tobacco, do not start.  Lung cancer screening is recommended for adults aged 45-80 years who are at high risk for developing lung cancer because of a history of smoking. A yearly low-dose CT scan of the lungs is recommended for people who have at least a 30-pack-year history of smoking and are a current smoker or have quit within the past 15 years. A pack year of smoking is smoking an average of 1 pack of cigarettes a day for 1 year (for example: 1 pack a day for 30 years or 2 packs a day for 15 years). Yearly screening should continue until the smoker has stopped smoking for at least 15 years. Yearly screening should be stopped for people who develop a health problem that would prevent them from having lung cancer treatment.  If you are pregnant, do not drink alcohol. If you are  breastfeeding, be very cautious about drinking alcohol. If you are not pregnant and choose to drink alcohol, do not have more than 1 drink per day. One drink is considered to be 12 ounces (355 mL) of beer, 5 ounces (148 mL) of wine, or 1.5 ounces (44 mL) of liquor.  Avoid use of street drugs. Do not share needles with anyone. Ask for help if you need support or instructions about stopping the use of drugs.  High blood pressure causes heart disease and increases the risk  of stroke. Your blood pressure should be checked at least every 1 to 2 years. Ongoing high blood pressure should be treated with medicines if weight loss and exercise do not work.  If you are 55-79 years old, ask your health care provider if you should take aspirin to prevent strokes.  Diabetes screening is done by taking a blood sample to check your blood glucose level after you have not eaten for a certain period of time (fasting). If you are not overweight and you do not have risk factors for diabetes, you should be screened once every 3 years starting at age 45. If you are overweight or obese and you are 40-70 years of age, you should be screened for diabetes every year as part of your cardiovascular risk assessment.  Breast cancer screening is essential preventive care for women. You should practice "breast self-awareness." This means understanding the normal appearance and feel of your breasts and may include breast self-examination. Any changes detected, no matter how small, should be reported to a health care provider. Women in their 20s and 30s should have a clinical breast exam (CBE) by a health care provider as part of a regular health exam every 1 to 3 years. After age 40, women should have a CBE every year. Starting at age 40, women should consider having a mammogram (breast X-ray test) every year. Women who have a family history of breast cancer should talk to their health care provider about genetic screening. Women at a high risk of breast cancer should talk to their health care providers about having an MRI and a mammogram every year.  Breast cancer gene (BRCA)-related cancer risk assessment is recommended for women who have family members with BRCA-related cancers. BRCA-related cancers include breast, ovarian, tubal, and peritoneal cancers. Having family members with these cancers may be associated with an increased risk for harmful changes (mutations) in the breast cancer genes BRCA1 and  BRCA2. Results of the assessment will determine the need for genetic counseling and BRCA1 and BRCA2 testing.  Your health care provider may recommend that you be screened regularly for cancer of the pelvic organs (ovaries, uterus, and vagina). This screening involves a pelvic examination, including checking for microscopic changes to the surface of your cervix (Pap test). You may be encouraged to have this screening done every 3 years, beginning at age 21.  For women ages 30-65, health care providers may recommend pelvic exams and Pap testing every 3 years, or they may recommend the Pap and pelvic exam, combined with testing for human papilloma virus (HPV), every 5 years. Some types of HPV increase your risk of cervical cancer. Testing for HPV may also be done on women of any age with unclear Pap test results.  Other health care providers may not recommend any screening for nonpregnant women who are considered low risk for pelvic cancer and who do not have symptoms. Ask your health care provider if a screening pelvic exam is right for   you.  If you have had past treatment for cervical cancer or a condition that could lead to cancer, you need Pap tests and screening for cancer for at least 20 years after your treatment. If Pap tests have been discontinued, your risk factors (such as having a new sexual partner) need to be reassessed to determine if screening should resume. Some women have medical problems that increase the chance of getting cervical cancer. In these cases, your health care provider may recommend more frequent screening and Pap tests.  Colorectal cancer can be detected and often prevented. Most routine colorectal cancer screening begins at the age of 50 years and continues through age 75 years. However, your health care provider may recommend screening at an earlier age if you have risk factors for colon cancer. On a yearly basis, your health care provider may provide home test kits to check  for hidden blood in the stool. Use of a small camera at the end of a tube, to directly examine the colon (sigmoidoscopy or colonoscopy), can detect the earliest forms of colorectal cancer. Talk to your health care provider about this at age 50, when routine screening begins. Direct exam of the colon should be repeated every 5-10 years through age 75 years, unless early forms of precancerous polyps or small growths are found.  People who are at an increased risk for hepatitis B should be screened for this virus. You are considered at high risk for hepatitis B if:  You were born in a country where hepatitis B occurs often. Talk with your health care provider about which countries are considered high risk.  Your parents were born in a high-risk country and you have not received a shot to protect against hepatitis B (hepatitis B vaccine).  You have HIV or AIDS.  You use needles to inject street drugs.  You live with, or have sex with, someone who has hepatitis B.  You get hemodialysis treatment.  You take certain medicines for conditions like cancer, organ transplantation, and autoimmune conditions.  Hepatitis C blood testing is recommended for all people born from 1945 through 1965 and any individual with known risks for hepatitis C.  Practice safe sex. Use condoms and avoid high-risk sexual practices to reduce the spread of sexually transmitted infections (STIs). STIs include gonorrhea, chlamydia, syphilis, trichomonas, herpes, HPV, and human immunodeficiency virus (HIV). Herpes, HIV, and HPV are viral illnesses that have no cure. They can result in disability, cancer, and death.  You should be screened for sexually transmitted illnesses (STIs) including gonorrhea and chlamydia if:  You are sexually active and are younger than 24 years.  You are older than 24 years and your health care provider tells you that you are at risk for this type of infection.  Your sexual activity has changed  since you were last screened and you are at an increased risk for chlamydia or gonorrhea. Ask your health care provider if you are at risk.  If you are at risk of being infected with HIV, it is recommended that you take a prescription medicine daily to prevent HIV infection. This is called preexposure prophylaxis (PrEP). You are considered at risk if:  You are sexually active and do not regularly use condoms or know the HIV status of your partner(s).  You take drugs by injection.  You are sexually active with a partner who has HIV.  Talk with your health care provider about whether you are at high risk of being infected with HIV. If   you choose to begin PrEP, you should first be tested for HIV. You should then be tested every 3 months for as long as you are taking PrEP.  Osteoporosis is a disease in which the bones lose minerals and strength with aging. This can result in serious bone fractures or breaks. The risk of osteoporosis can be identified using a bone density scan. Women ages 67 years and over and women at risk for fractures or osteoporosis should discuss screening with their health care providers. Ask your health care provider whether you should take a calcium supplement or vitamin D to reduce the rate of osteoporosis.  Menopause can be associated with physical symptoms and risks. Hormone replacement therapy is available to decrease symptoms and risks. You should talk to your health care provider about whether hormone replacement therapy is right for you.  Use sunscreen. Apply sunscreen liberally and repeatedly throughout the day. You should seek shade when your shadow is shorter than you. Protect yourself by wearing long sleeves, pants, a wide-brimmed hat, and sunglasses year round, whenever you are outdoors.  Once a month, do a whole body skin exam, using a mirror to look at the skin on your back. Tell your health care provider of new moles, moles that have irregular borders, moles that  are larger than a pencil eraser, or moles that have changed in shape or color.  Stay current with required vaccines (immunizations).  Influenza vaccine. All adults should be immunized every year.  Tetanus, diphtheria, and acellular pertussis (Td, Tdap) vaccine. Pregnant women should receive 1 dose of Tdap vaccine during each pregnancy. The dose should be obtained regardless of the length of time since the last dose. Immunization is preferred during the 27th-36th week of gestation. An adult who has not previously received Tdap or who does not know her vaccine status should receive 1 dose of Tdap. This initial dose should be followed by tetanus and diphtheria toxoids (Td) booster doses every 10 years. Adults with an unknown or incomplete history of completing a 3-dose immunization series with Td-containing vaccines should begin or complete a primary immunization series including a Tdap dose. Adults should receive a Td booster every 10 years.  Varicella vaccine. An adult without evidence of immunity to varicella should receive 2 doses or a second dose if she has previously received 1 dose. Pregnant females who do not have evidence of immunity should receive the first dose after pregnancy. This first dose should be obtained before leaving the health care facility. The second dose should be obtained 4-8 weeks after the first dose.  Human papillomavirus (HPV) vaccine. Females aged 13-26 years who have not received the vaccine previously should obtain the 3-dose series. The vaccine is not recommended for use in pregnant females. However, pregnancy testing is not needed before receiving a dose. If a female is found to be pregnant after receiving a dose, no treatment is needed. In that case, the remaining doses should be delayed until after the pregnancy. Immunization is recommended for any person with an immunocompromised condition through the age of 61 years if she did not get any or all doses earlier. During the  3-dose series, the second dose should be obtained 4-8 weeks after the first dose. The third dose should be obtained 24 weeks after the first dose and 16 weeks after the second dose.  Zoster vaccine. One dose is recommended for adults aged 30 years or older unless certain conditions are present.  Measles, mumps, and rubella (MMR) vaccine. Adults born  before 1957 generally are considered immune to measles and mumps. Adults born in 1957 or later should have 1 or more doses of MMR vaccine unless there is a contraindication to the vaccine or there is laboratory evidence of immunity to each of the three diseases. A routine second dose of MMR vaccine should be obtained at least 28 days after the first dose for students attending postsecondary schools, health care workers, or international travelers. People who received inactivated measles vaccine or an unknown type of measles vaccine during 1963-1967 should receive 2 doses of MMR vaccine. People who received inactivated mumps vaccine or an unknown type of mumps vaccine before 1979 and are at high risk for mumps infection should consider immunization with 2 doses of MMR vaccine. For females of childbearing age, rubella immunity should be determined. If there is no evidence of immunity, females who are not pregnant should be vaccinated. If there is no evidence of immunity, females who are pregnant should delay immunization until after pregnancy. Unvaccinated health care workers born before 1957 who lack laboratory evidence of measles, mumps, or rubella immunity or laboratory confirmation of disease should consider measles and mumps immunization with 2 doses of MMR vaccine or rubella immunization with 1 dose of MMR vaccine.  Pneumococcal 13-valent conjugate (PCV13) vaccine. When indicated, a person who is uncertain of his immunization history and has no record of immunization should receive the PCV13 vaccine. All adults 65 years of age and older should receive this  vaccine. An adult aged 19 years or older who has certain medical conditions and has not been previously immunized should receive 1 dose of PCV13 vaccine. This PCV13 should be followed with a dose of pneumococcal polysaccharide (PPSV23) vaccine. Adults who are at high risk for pneumococcal disease should obtain the PPSV23 vaccine at least 8 weeks after the dose of PCV13 vaccine. Adults older than 33 years of age who have normal immune system function should obtain the PPSV23 vaccine dose at least 1 year after the dose of PCV13 vaccine.  Pneumococcal polysaccharide (PPSV23) vaccine. When PCV13 is also indicated, PCV13 should be obtained first. All adults aged 65 years and older should be immunized. An adult younger than age 65 years who has certain medical conditions should be immunized. Any person who resides in a nursing home or long-term care facility should be immunized. An adult smoker should be immunized. People with an immunocompromised condition and certain other conditions should receive both PCV13 and PPSV23 vaccines. People with human immunodeficiency virus (HIV) infection should be immunized as soon as possible after diagnosis. Immunization during chemotherapy or radiation therapy should be avoided. Routine use of PPSV23 vaccine is not recommended for American Indians, [place] Natives, or people younger than 65 years unless there are medical conditions that require PPSV23 vaccine. When indicated, people who have unknown immunization and have no record of immunization should receive PPSV23 vaccine. One-time revaccination 5 years after the first dose of PPSV23 is recommended for people aged 19-64 years who have chronic kidney failure, nephrotic syndrome, asplenia, or immunocompromised conditions. People who received 1-2 doses of PPSV23 before age 65 years should receive another dose of PPSV23 vaccine at age 65 years or later if at least 5 years have passed since the previous dose. Doses of PPSV23 are not  needed for people immunized with PPSV23 at or after age 65 years.  Meningococcal vaccine. Adults with asplenia or persistent complement component deficiencies should receive 2 doses of quadrivalent meningococcal conjugate (MenACWY-D) vaccine. The doses should be obtained   at least 2 months apart. Microbiologists working with certain meningococcal bacteria, [place] recruits, people at risk during an outbreak, and people who travel to or live in countries with a high rate of meningitis should be immunized. A first-year college student up through age 34 years who is living in a residence hall should receive a dose if she did not receive a dose on or after her 16th birthday. Adults who have certain high-risk conditions should receive one or more doses of vaccine.  Hepatitis A vaccine. Adults who wish to be protected from this disease, have certain high-risk conditions, work with hepatitis A-infected animals, work in hepatitis A research labs, or travel to or work in countries with a high rate of hepatitis A should be immunized. Adults who were previously unvaccinated and who anticipate close contact with an international adoptee during the first 60 days after arrival in the [place] from a country with a high rate of hepatitis A should be immunized.  Hepatitis B vaccine. Adults who wish to be protected from this disease, have certain high-risk conditions, may be exposed to blood or other infectious body fluids, are household contacts or sex partners of hepatitis B positive people, are clients or workers in certain care facilities, or travel to or work in countries with a high rate of hepatitis B should be immunized.  Haemophilus influenzae type b (Hib) vaccine. A previously unvaccinated person with asplenia or sickle cell disease or having a scheduled splenectomy should receive 1 dose of Hib vaccine. Regardless of previous immunization, a recipient of a hematopoietic stem cell transplant should receive a  3-dose series 6-12 months after her successful transplant. Hib vaccine is not recommended for adults with HIV infection. Preventive Services / Frequency Ages 35 to 4 years  Blood pressure check.** / Every 3-5 years.  Lipid and cholesterol check.** / Every 5 years beginning at age 60.  Clinical breast exam.** / Every 3 years for women in their 71s and 10s.  BRCA-related cancer risk assessment.** / For women who have family members with a BRCA-related cancer (breast, ovarian, tubal, or peritoneal cancers).  Pap test.** / Every 2 years from ages 76 through 26. Every 3 years starting at age 61 through age 76 or 93 with a history of 3 consecutive normal Pap tests.  HPV screening.** / Every 3 years from ages 37 through ages 60 to 51 with a history of 3 consecutive normal Pap tests.  Hepatitis C blood test.** / For any individual with known risks for hepatitis C.  Skin self-exam. / Monthly.  Influenza vaccine. / Every year.  Tetanus, diphtheria, and acellular pertussis (Tdap, Td) vaccine.** / Consult your health care provider. Pregnant women should receive 1 dose of Tdap vaccine during each pregnancy. 1 dose of Td every 10 years.  Varicella vaccine.** / Consult your health care provider. Pregnant females who do not have evidence of immunity should receive the first dose after pregnancy.  HPV vaccine. / 3 doses over 6 months, if 93 and younger. The vaccine is not recommended for use in pregnant females. However, pregnancy testing is not needed before receiving a dose.  Measles, mumps, rubella (MMR) vaccine.** / You need at least 1 dose of MMR if you were born in 1957 or later. You may also need a 2nd dose. For females of childbearing age, rubella immunity should be determined. If there is no evidence of immunity, females who are not pregnant should be vaccinated. If there is no evidence of immunity, females who are  pregnant should delay immunization until after pregnancy.  Pneumococcal  13-valent conjugate (PCV13) vaccine.** / Consult your health care provider.  Pneumococcal polysaccharide (PPSV23) vaccine.** / 1 to 2 doses if you smoke cigarettes or if you have certain conditions.  Meningococcal vaccine.** / 1 dose if you are age 68 to 8 years and a Market researcher living in a residence hall, or have one of several medical conditions, you need to get vaccinated against meningococcal disease. You may also need additional booster doses.  Hepatitis A vaccine.** / Consult your health care provider.  Hepatitis B vaccine.** / Consult your health care provider.  Haemophilus influenzae type b (Hib) vaccine.** / Consult your health care provider. Ages 7 to 53 years  Blood pressure check.** / Every year.  Lipid and cholesterol check.** / Every 5 years beginning at age 25 years.  Lung cancer screening. / Every year if you are aged 11-80 years and have a 30-pack-year history of smoking and currently smoke or have quit within the past 15 years. Yearly screening is stopped once you have quit smoking for at least 15 years or develop a health problem that would prevent you from having lung cancer treatment.  Clinical breast exam.** / Every year after age 48 years.  BRCA-related cancer risk assessment.** / For women who have family members with a BRCA-related cancer (breast, ovarian, tubal, or peritoneal cancers).  Mammogram.** / Every year beginning at age 41 years and continuing for as long as you are in good health. Consult with your health care provider.  Pap test.** / Every 3 years starting at age 65 years through age 37 or 70 years with a history of 3 consecutive normal Pap tests.  HPV screening.** / Every 3 years from ages 72 years through ages 60 to 40 years with a history of 3 consecutive normal Pap tests.  Fecal occult blood test (FOBT) of stool. / Every year beginning at age 21 years and continuing until age 5 years. You may not need to do this test if you get  a colonoscopy every 10 years.  Flexible sigmoidoscopy or colonoscopy.** / Every 5 years for a flexible sigmoidoscopy or every 10 years for a colonoscopy beginning at age 35 years and continuing until age 48 years.  Hepatitis C blood test.** / For all people born from [birth year] through 1965 and any individual with known risks for hepatitis C.  Skin self-exam. / Monthly.  Influenza vaccine. / Every year.  Tetanus, diphtheria, and acellular pertussis (Tdap/Td) vaccine.** / Consult your health care provider. Pregnant women should receive 1 dose of Tdap vaccine during each pregnancy. 1 dose of Td every 10 years.  Varicella vaccine.** / Consult your health care provider. Pregnant females who do not have evidence of immunity should receive the first dose after pregnancy.  Zoster vaccine.** / 1 dose for adults aged 30 years or older.  Measles, mumps, rubella (MMR) vaccine.** / You need at least 1 dose of MMR if you were born in 1957 or later. You may also need a second dose. For females of childbearing age, rubella immunity should be determined. If there is no evidence of immunity, females who are not pregnant should be vaccinated. If there is no evidence of immunity, females who are pregnant should delay immunization until after pregnancy.  Pneumococcal 13-valent conjugate (PCV13) vaccine.** / Consult your health care provider.  Pneumococcal polysaccharide (PPSV23) vaccine.** / 1 to 2 doses if you smoke cigarettes or if you have certain conditions.  Meningococcal vaccine.** /  Consult your health care provider.  Hepatitis A vaccine.** / Consult your health care provider.  Hepatitis B vaccine.** / Consult your health care provider.  Haemophilus influenzae type b (Hib) vaccine.** / Consult your health care provider. Ages 64 years and over  Blood pressure check.** / Every year.  Lipid and cholesterol check.** / Every 5 years beginning at age 23 years.  Lung cancer screening. / Every year if you  are aged 16-80 years and have a 30-pack-year history of smoking and currently smoke or have quit within the past 15 years. Yearly screening is stopped once you have quit smoking for at least 15 years or develop a health problem that would prevent you from having lung cancer treatment.  Clinical breast exam.** / Every year after age 74 years.  BRCA-related cancer risk assessment.** / For women who have family members with a BRCA-related cancer (breast, ovarian, tubal, or peritoneal cancers).  Mammogram.** / Every year beginning at age 44 years and continuing for as long as you are in good health. Consult with your health care provider.  Pap test.** / Every 3 years starting at age 58 years through age 22 or 39 years with 3 consecutive normal Pap tests. Testing can be stopped between 65 and 70 years with 3 consecutive normal Pap tests and no abnormal Pap or HPV tests in the past 10 years.  HPV screening.** / Every 3 years from ages 64 years through ages 70 or 61 years with a history of 3 consecutive normal Pap tests. Testing can be stopped between 65 and 70 years with 3 consecutive normal Pap tests and no abnormal Pap or HPV tests in the past 10 years.  Fecal occult blood test (FOBT) of stool. / Every year beginning at age 40 years and continuing until age 27 years. You may not need to do this test if you get a colonoscopy every 10 years.  Flexible sigmoidoscopy or colonoscopy.** / Every 5 years for a flexible sigmoidoscopy or every 10 years for a colonoscopy beginning at age 7 years and continuing until age 32 years.  Hepatitis C blood test.** / For all people born from [birth year] through 1963 and any individual with known risks for hepatitis C.  Osteoporosis screening.** / A one-time screening for women ages 30 years and over and women at risk for fractures or osteoporosis.  Skin self-exam. / Monthly.  Influenza vaccine. / Every year.  Tetanus, diphtheria, and acellular pertussis (Tdap/Td)  vaccine.** / 1 dose of Td every 10 years.  Varicella vaccine.** / Consult your health care provider.  Zoster vaccine.** / 1 dose for adults aged 35 years or older.  Pneumococcal 13-valent conjugate (PCV13) vaccine.** / Consult your health care provider.  Pneumococcal polysaccharide (PPSV23) vaccine.** / 1 dose for all adults aged 46 years and older.  Meningococcal vaccine.** / Consult your health care provider.  Hepatitis A vaccine.** / Consult your health care provider.  Hepatitis B vaccine.** / Consult your health care provider.  Haemophilus influenzae type b (Hib) vaccine.** / Consult your health care provider. ** Family history and personal history of risk and conditions may change your health care provider's recommendations.   This information is not intended to replace advice given to you by your health care provider. Make sure you discuss any questions you have with your health care provider.   Document Released: 07/18/2001 Document Revised: 06/12/2014 Document Reviewed: 10/17/2010 Elsevier Interactive Patient Education [employer].

## 2016-03-31 NOTE — Progress Notes (Signed)
Pre visit review using our clinic review tool, if applicable. No additional management support is needed unless otherwise documented below in the visit note. 

## 2016-03-31 NOTE — Progress Notes (Signed)
Subjective:     Jeanne [name] is a 35 y.o. female and is here for a comprehensive physical exam. The patient reports no problems.  Social History   Social History  . Marital status: Married    Spouse name: N/A  . Number of children: N/A  . Years of education: N/A   Occupational History  .  [employer]    4 th grade   Social History Main Topics  . Smoking status: Never Smoker  . Smokeless tobacco: Not on file  . Alcohol use No  . Drug use: No  . Sexual activity: Yes    Partners: Male   Other Topics Concern  . Not on file   Social History Narrative   Exercise--- walk in summer--- its been difficult during school year   Health Maintenance  Topic Date Due  . HIV Screening  11/20/1997  . PAP SMEAR  06/05/2016  . TETANUS/TDAP  10/20/2024  . INFLUENZA VACCINE  Addressed    The following portions of the patient's history were reviewed and updated as appropriate:  She  has no past medical history on file. She  does not have any pertinent problems on file. She  has a past surgical history that includes Eye surgery (Bilateral, 2006). Her family history includes Cancer in her maternal grandfather, maternal grandmother, and paternal grandfather; Deep vein thrombosis in her mother; Fibroids in her mother; Thyroid disease in her mother. She  reports that she has never smoked. She has never used smokeless tobacco. She reports that she does not drink alcohol or use drugs. She has a current medication list which includes the following prescription(s): albuterol, albuterol sulfate, cholecalciferol, hydrochlorothiazide, moxifloxacin, potassium, sprintec 28, triamcinolone cream, paroxetine, and sertraline. Current Outpatient Prescriptions on File Prior to Visit  Medication Sig Dispense Refill  . albuterol (PROAIR HFA) 108 (90 Base) MCG/ACT inhaler Inhale 2 puffs into the lungs every 6 (six) hours as needed for wheezing or shortness of breath. 1 Inhaler 5  . Albuterol Sulfate  (PROAIR RESPICLICK) 123XX123 (90 BASE) MCG/ACT AEPB Inhale 2 puffs into the lungs 4 (four) times daily as needed. 1 each 3  . Cholecalciferol (VITAMIN D PO) Take 1 tablet by mouth daily.    . hydrochlorothiazide (HYDRODIURIL) 25 MG tablet Take 1 tablet (25 mg total) by mouth daily. 1 po qd prn 30 tablet 11  . moxifloxacin (VIGAMOX) 0.5 % ophthalmic solution Place 1 drop into both eyes 3 (three) times daily. 3 mL 0  . SPRINTEC 28 0.25-35 MG-MCG tablet TAKE 1 TABLET BY MOUTH DAILY. 28 tablet 11  . triamcinolone cream (KENALOG) 0.1 % Apply 1 application topically 2 (two) times daily. 15 g 3  . PARoxetine (PAXIL) 10 MG tablet TAKE 1 TABLET (10 MG TOTAL) BY MOUTH DAILY. (Patient not taking: Reported on 03/31/2016) 30 tablet 5   No current facility-administered medications on file prior to visit.    She has No Known Allergies..  Review of Systems Review of Systems  Constitutional: Negative for activity change, appetite change and fatigue.  HENT: Negative for hearing loss, congestion, tinnitus and ear discharge.  dentist q18m Eyes: Negative for visual disturbance  Respiratory: Negative for cough, chest tightness and shortness of breath.   Cardiovascular: Negative for chest pain, palpitations and leg swelling.  Gastrointestinal: Negative for abdominal pain, diarrhea, constipation and abdominal distention.  Genitourinary: Negative for urgency, frequency, decreased urine volume and difficulty urinating.  Musculoskeletal: Negative for back pain, arthralgias and gait problem.  Skin: Negative for color change,  pallor and rash.  Neurological: Negative for dizziness, light-headedness, numbness and headaches.  Hematological: Negative for adenopathy. Does not bruise/bleed easily.  Psychiatric/Behavioral: Negative for suicidal ideas, confusion, sleep disturbance, self-injury, dysphoric mood, decreased concentration and agitation.       Objective:    BP 130/78 (BP Location: Left Arm, Patient Position:  Sitting, Cuff Size: Large)   Pulse 96   Temp 98.5 F (36.9 C) (Oral)   Resp 16   Ht 5\' 2"  (1.575 m)   Wt 276 lb 9.6 oz (125.5 kg)   LMP 03/17/2016 (Approximate)   SpO2 95%   BMI 50.59 kg/m  General appearance: alert, cooperative, appears stated age and no distress Head: Normocephalic, without obvious abnormality, atraumatic Eyes: conjunctivae/corneas clear. PERRL, EOM's intact. Fundi benign. Ears: normal TM's and external ear canals both ears Nose: Nares normal. Septum midline. Mucosa normal. No drainage or sinus tenderness. Throat: lips, mucosa, and tongue normal; teeth and gums normal Neck: no adenopathy, no carotid bruit, no JVD, supple, symmetrical, trachea midline and thyroid not enlarged, symmetric, no tenderness/mass/nodules Back: symmetric, no curvature. ROM normal. No CVA tenderness. Lungs: clear to auscultation bilaterally Breasts: normal appearance, no masses or tenderness Heart: regular rate and rhythm, S1, S2 normal, no murmur, click, rub or gallop Abdomen: soft, non-tender; bowel sounds normal; no masses,  no organomegaly Pelvic: cervix normal in appearance, external genitalia normal, no adnexal masses or tenderness, no cervical motion tenderness, rectovaginal septum normal, uterus normal size, shape, and consistency, vagina normal without discharge and pap done Extremities: extremities normal, atraumatic, no cyanosis or edema Pulses: 2+ and symmetric Skin: Skin color, texture, turgor normal. No rashes or lesions Lymph nodes: Cervical, supraclavicular, and axillary nodes normal. Neurologic: Alert and oriented X 3, normal strength and tone. Normal symmetric reflexes. Normal coordination and gait    Assessment:    Healthy female exam.      Plan:    ghm utd Check labs See After Visit Summary for Counseling Recommendations    1. Preventative health care See above - Comprehensive metabolic panel - Lipid panel - POCT urinalysis dipstick - TSH - CBC with  Differential/Platelet - Cytology - PAP

## 2016-04-01 ENCOUNTER — Encounter: Payer: Self-pay | Admitting: Family Medicine

## 2016-04-03 ENCOUNTER — Other Ambulatory Visit (INDEPENDENT_AMBULATORY_CARE_PROVIDER_SITE_OTHER): Payer: BC Managed Care – PPO

## 2016-04-03 DIAGNOSIS — I10 Essential (primary) hypertension: Secondary | ICD-10-CM | POA: Diagnosis not present

## 2016-04-03 LAB — BASIC METABOLIC PANEL
BUN: 14 mg/dL (ref 6–23)
CHLORIDE: 104 meq/L (ref 96–112)
CO2: 25 meq/L (ref 19–32)
CREATININE: 0.65 mg/dL (ref 0.40–1.20)
Calcium: 9.2 mg/dL (ref 8.4–10.5)
GFR: 111.32 mL/min (ref >60.00)
Glucose, Bld: 104 mg/dL — ABNORMAL HIGH (ref 70–99)
POTASSIUM: 3.6 meq/L (ref 3.5–5.1)
Sodium: 139 mEq/L (ref 135–145)

## 2016-04-05 LAB — CYTOLOGY - PAP
Adequacy: ABSENT
Diagnosis: NEGATIVE

## 2016-04-13 ENCOUNTER — Encounter: Payer: Self-pay | Admitting: Family Medicine

## 2016-04-21 ENCOUNTER — Ambulatory Visit: Payer: Self-pay | Admitting: Family Medicine

## 2016-04-21 ENCOUNTER — Telehealth: Payer: Self-pay | Admitting: *Deleted

## 2016-04-21 NOTE — Telephone Encounter (Signed)
Called and [facility] @ 8:43am [phone]) asking the pt to RTC regarding medications.//AB/CMA

## 2016-04-21 NOTE — Telephone Encounter (Signed)
Spoke with the pt and informed her that we received a letter from [facility] stating that she was taking both Paxil and Zoloft.  So Dr. Etter Sjogren wanted to make sure that she was not taking both of them.   Pt stated that she was not taking both the Paxil and Zoloft.  She stated that she went to see a therapist and he want her to stop the Paxil and start the Zoloft.//AB/CMA

## 2016-06-29 ENCOUNTER — Ambulatory Visit: Payer: Self-pay | Admitting: Medical

## 2016-07-24 ENCOUNTER — Encounter: Payer: Self-pay | Admitting: *Deleted

## 2016-07-24 NOTE — Telephone Encounter (Signed)
Opened in error

## 2016-07-25 ENCOUNTER — Ambulatory Visit (INDEPENDENT_AMBULATORY_CARE_PROVIDER_SITE_OTHER): Payer: BC Managed Care – PPO | Admitting: Family Medicine

## 2016-07-25 ENCOUNTER — Encounter: Payer: Self-pay | Admitting: Family Medicine

## 2016-07-25 DIAGNOSIS — I10 Essential (primary) hypertension: Secondary | ICD-10-CM | POA: Diagnosis not present

## 2016-07-25 NOTE — Assessment & Plan Note (Signed)
Controlled today off meds F/u 2-3 weeks  Watch salt

## 2016-07-25 NOTE — Progress Notes (Signed)
Pre visit review using our clinic review tool, if applicable. No additional management support is needed unless otherwise documented below in the visit note. 

## 2016-07-25 NOTE — Patient Instructions (Signed)
DASH Eating Plan DASH stands for "Dietary Approaches to Stop Hypertension." The DASH eating plan is a healthy eating plan that has been shown to reduce high blood pressure (hypertension). Additional health benefits may include reducing the risk of type 2 diabetes mellitus, heart disease, and stroke. The DASH eating plan may also help with weight loss. What do I need to know about the DASH eating plan? For the DASH eating plan, you will follow these general guidelines:  Choose foods with less than 150 milligrams of sodium per serving (as listed on the food label).  Use salt-free seasonings or herbs instead of table salt or sea salt.  Check with your health care provider or pharmacist before using salt substitutes.  Eat lower-sodium products. These are often labeled as "low-sodium" or "no salt added."  Eat fresh foods. Avoid eating a lot of canned foods.  Eat more vegetables, fruits, and low-fat dairy products.  Choose whole grains. Look for the word "whole" as the first word in the ingredient list.  Choose fish and skinless chicken or turkey more often than red meat. Limit fish, poultry, and meat to 6 oz (170 g) each day.  Limit sweets, desserts, sugars, and sugary drinks.  Choose heart-healthy fats.  Eat more home-cooked food and less restaurant, buffet, and fast food.  Limit fried foods.  Do not fry foods. Cook foods using methods such as baking, boiling, grilling, and broiling instead.  When eating at a restaurant, ask that your food be prepared with less salt, or no salt if possible. What foods can I eat? Seek help from a dietitian for individual calorie needs. Grains  Whole grain or whole wheat bread. Brown rice. Whole grain or whole wheat pasta. Quinoa, bulgur, and whole grain cereals. Low-sodium cereals. Corn or whole wheat flour tortillas. Whole grain cornbread. Whole grain crackers. Low-sodium crackers. Vegetables  Fresh or frozen vegetables (raw, steamed, roasted, or  grilled). Low-sodium or reduced-sodium tomato and vegetable juices. Low-sodium or reduced-sodium tomato sauce and paste. Low-sodium or reduced-sodium canned vegetables. Fruits  All fresh, canned (in natural juice), or frozen fruits. Meat and Other Protein Products  Ground beef (85% or leaner), grass-fed beef, or beef trimmed of fat. Skinless chicken or turkey. Ground chicken or turkey. Pork trimmed of fat. All fish and seafood. Eggs. Dried beans, peas, or lentils. Unsalted nuts and seeds. Unsalted canned beans. Dairy  Low-fat dairy products, such as skim or 1% milk, 2% or reduced-fat cheeses, low-fat ricotta or cottage cheese, or plain low-fat yogurt. Low-sodium or reduced-sodium cheeses. Fats and Oils  Tub margarines without trans fats. Light or reduced-fat mayonnaise and salad dressings (reduced sodium). Avocado. Safflower, olive, or canola oils. Natural peanut or almond butter. Other  Unsalted popcorn and pretzels. The items listed above may not be a complete list of recommended foods or beverages. Contact your dietitian for more options.  What foods are not recommended? Grains  White bread. White pasta. White rice. Refined cornbread. Bagels and croissants. Crackers that contain trans fat. Vegetables  Creamed or fried vegetables. Vegetables in a cheese sauce. Regular canned vegetables. Regular canned tomato sauce and paste. Regular tomato and vegetable juices. Fruits  Canned fruit in light or heavy syrup. Fruit juice. Meat and Other Protein Products  Fatty cuts of meat. Ribs, chicken wings, bacon, sausage, bologna, salami, chitterlings, fatback, hot dogs, bratwurst, and packaged luncheon meats. Salted nuts and seeds. Canned beans with salt. Dairy  Whole or 2% milk, cream, half-and-half, and cream cheese. Whole-fat or sweetened yogurt. Full-fat cheeses   or blue cheese. Nondairy creamers and whipped toppings. Processed cheese, cheese spreads, or cheese curds. Condiments  Onion and garlic  salt, seasoned salt, table salt, and sea salt. Canned and packaged gravies. Worcestershire sauce. Tartar sauce. Barbecue sauce. Teriyaki sauce. Soy sauce, including reduced sodium. Steak sauce. Fish sauce. Oyster sauce. Cocktail sauce. Horseradish. Ketchup and mustard. Meat flavorings and tenderizers. Bouillon cubes. Hot sauce. Tabasco sauce. Marinades. Taco seasonings. Relishes. Fats and Oils  Butter, stick margarine, lard, shortening, ghee, and bacon fat. Coconut, palm kernel, or palm oils. Regular salad dressings. Other  Pickles and olives. Salted popcorn and pretzels. The items listed above may not be a complete list of foods and beverages to avoid. Contact your dietitian for more information.  Where can I find more information? National Heart, Lung, and Blood Institute: www.nhlbi.nih.gov/health/health-topics/topics/dash/ This information is not intended to replace advice given to you by your health care provider. Make sure you discuss any questions you have with your health care provider. Document Released: 05/11/2011 Document Revised: 10/28/2015 Document Reviewed: 03/26/2013 Elsevier Interactive Patient Education  2017 Elsevier Inc.  

## 2016-07-25 NOTE — Progress Notes (Signed)
Subjective:    Patient ID: Jeanne [name], female    DOB: 07/17/1980, 36 y.o.   MRN: [id number]  Chief Complaint  Patient presents with  . Hypertension    had high blood pressure at dentist yesterday.    HPI Patient is in today for acute visit for hypertension.  Had a high blood pressure at dentist yesterday.  Also been having a headache for the past 2 weeks.  Thinks it may be related to work.  They usually start in the afternoon. Pt went to UC with fever and sore throat and bp was high and then she went to the dentist yesterday and it was 170/119.   No past medical history on file.  Past Surgical History:  Procedure Laterality Date  . EYE SURGERY Bilateral 2006    Family History  Problem Relation Age of Onset  . Thyroid disease Mother   . Fibroids Mother   . Deep vein thrombosis Mother   . Cancer Maternal Grandmother     liver   . Cancer Maternal Grandfather     pancreatic  . Cancer Paternal Grandfather     prostate    Social History   Social History  . Marital status: Married    Spouse name: N/A  . Number of children: N/A  . Years of education: N/A   Occupational History  .  [employer]    4 th grade   Social History Main Topics  . Smoking status: Never Smoker  . Smokeless tobacco: Never Used  . Alcohol use No  . Drug use: No  . Sexual activity: Yes    Partners: Male   Other Topics Concern  . Not on file   Social History Narrative   Exercise--- walk in summer--- its been difficult during school year    Outpatient Medications Prior to Visit  Medication Sig Dispense Refill  . albuterol (PROAIR HFA) 108 (90 Base) MCG/ACT inhaler Inhale 2 puffs into the lungs every 6 (six) hours as needed for wheezing or shortness of breath. 1 Inhaler 5  . Albuterol Sulfate (PROAIR RESPICLICK) 123XX123 (90 BASE) MCG/ACT AEPB Inhale 2 puffs into the lungs 4 (four) times daily as needed. 1 each 3  . Cholecalciferol (VITAMIN D PO) Take 1 tablet by mouth daily.      [name] moxifloxacin (VIGAMOX) 0.5 % ophthalmic solution Place 1 drop into both eyes 3 (three) times daily. 3 mL 0  . Potassium 99 MG TABS Take 99 mg by mouth daily.    . sertraline (ZOLOFT) 50 MG tablet Take 75 mg by mouth daily.    [name] triamcinolone cream (KENALOG) 0.1 % Apply 1 application topically 2 (two) times daily. 15 g 3  . hydrochlorothiazide (HYDRODIURIL) 25 MG tablet Take 1 tablet (25 mg total) by mouth daily. 1 po qd prn 30 tablet 11  . SPRINTEC 28 0.25-35 MG-MCG tablet TAKE 1 TABLET BY MOUTH DAILY. 28 tablet 11   No facility-administered medications prior to visit.     No Known Allergies  Review of Systems  Constitutional: Negative for chills, fever and malaise/fatigue.  HENT: Negative for congestion and hearing loss.   Eyes: Negative for discharge.  Respiratory: Negative for cough, sputum production and shortness of breath.   Cardiovascular: Negative for chest pain, palpitations and leg swelling.  Gastrointestinal: Negative for abdominal pain, blood in stool, constipation, diarrhea, heartburn, nausea and vomiting.  Genitourinary: Negative for dysuria, frequency, hematuria and urgency.  Musculoskeletal: Negative for back pain, falls and myalgias.  Skin:  Negative for rash.  Neurological: Positive for headaches. Negative for dizziness, sensory change, loss of consciousness and weakness.  Endo/Heme/Allergies: Negative for environmental allergies. Does not bruise/bleed easily.  Psychiatric/Behavioral: Negative for depression and suicidal ideas. The patient is not nervous/anxious and does not have insomnia.        Objective:    Physical Exam  Constitutional: She is oriented to person, place, and time. She appears well-developed and well-nourished.  HENT:  Head: Normocephalic and atraumatic.  Eyes: Conjunctivae and EOM are normal.  Neck: Normal range of motion. Neck supple. No JVD present. Carotid bruit is not present. No thyromegaly present.  Cardiovascular: Normal rate,  regular rhythm and normal heart sounds.   No murmur heard. Pulmonary/Chest: Effort normal and breath sounds normal. No respiratory distress. She has no wheezes. She has no rales. She exhibits no tenderness.  Musculoskeletal: She exhibits no edema.  Neurological: She is alert and oriented to person, place, and time.  Psychiatric: She has a normal mood and affect.  Nursing note reviewed.   BP 120/82   Pulse 98   Temp 98.2 F (36.8 C) (Oral)   Resp 16   Ht 5\' 2"  (1.575 m)   Wt 274 lb 12.8 oz (124.6 kg)   LMP 07/05/2016   SpO2 96%   BMI 50.26 kg/m  Wt Readings from Last 3 Encounters:  07/25/16 274 lb 12.8 oz (124.6 kg)  03/31/16 276 lb 9.6 oz (125.5 kg)  01/25/16 289 lb (131.1 kg)     Lab Results  Component Value Date   WBC 9.9 01/10/2016   HGB 14.5 01/10/2016   HCT 42.1 01/10/2016   PLT 310.0 01/10/2016   GLUCOSE 104 (H) 04/03/2016   CHOL 204 (H) 03/04/2015   TRIG 83.0 03/04/2015   HDL 58.40 03/04/2015   LDLCALC 129 (H) 03/04/2015   ALT 13 01/10/2016   AST 13 01/10/2016   NA 139 04/03/2016   K 3.6 04/03/2016   CL 104 04/03/2016   CREATININE 0.65 04/03/2016   BUN 14 04/03/2016   CO2 25 04/03/2016   TSH 2.62 03/04/2015    Lab Results  Component Value Date   TSH 2.62 03/04/2015   Lab Results  Component Value Date   WBC 9.9 01/10/2016   HGB 14.5 01/10/2016   HCT 42.1 01/10/2016   MCV 90.5 01/10/2016   PLT 310.0 01/10/2016   Lab Results  Component Value Date   NA 139 04/03/2016   K 3.6 04/03/2016   CO2 25 04/03/2016   GLUCOSE 104 (H) 04/03/2016   BUN 14 04/03/2016   CREATININE 0.65 04/03/2016   BILITOT 0.3 01/10/2016   ALKPHOS 62 01/10/2016   AST 13 01/10/2016   ALT 13 01/10/2016   PROT 7.0 01/10/2016   ALBUMIN 3.8 01/10/2016   CALCIUM 9.2 04/03/2016   GFR 111.32 04/03/2016   Lab Results  Component Value Date   CHOL 204 (H) 03/04/2015   Lab Results  Component Value Date   HDL 58.40 03/04/2015   Lab Results  Component Value Date   LDLCALC  129 (H) 03/04/2015   Lab Results  Component Value Date   TRIG 83.0 03/04/2015   Lab Results  Component Value Date   CHOLHDL 3 03/04/2015   No results found for: HGBA1C     Assessment & Plan:   Problem List Items Addressed This Visit      Unprioritized   HTN (hypertension)    Controlled today off meds F/u 2-3 weeks  Watch salt  I have discontinued Ms. Mizuno's hydrochlorothiazide and SPRINTEC 28. I am also having her maintain her Albuterol Sulfate, Cholecalciferol (VITAMIN D PO), albuterol, moxifloxacin, triamcinolone cream, sertraline, and Potassium.  No orders of the defined types were placed in this encounter.   CMA served as Education administrator during this visit. History, Physical and Plan performed by medical provider. Documentation and orders reviewed and attested to.  Ann Held, DO

## 2016-07-31 ENCOUNTER — Ambulatory Visit: Payer: Self-pay | Admitting: Family Medicine

## 2016-08-17 ENCOUNTER — Encounter: Payer: Self-pay | Admitting: Family Medicine

## 2016-08-17 ENCOUNTER — Telehealth: Payer: Self-pay | Admitting: Family Medicine

## 2016-08-17 ENCOUNTER — Ambulatory Visit (INDEPENDENT_AMBULATORY_CARE_PROVIDER_SITE_OTHER): Payer: BC Managed Care – PPO | Admitting: Family Medicine

## 2016-08-17 VITALS — BP 112/82 | HR 108 | Temp 99.0°F | Ht 62.0 in | Wt 277.5 lb

## 2016-08-17 DIAGNOSIS — R000 Tachycardia, unspecified: Secondary | ICD-10-CM | POA: Diagnosis not present

## 2016-08-17 MED ORDER — METOPROLOL SUCCINATE ER 25 MG PO TB24: 25.0000 mg | ORAL_TABLET | Freq: Every day | ORAL | 3 refills | Status: DC

## 2016-08-17 NOTE — Progress Notes (Signed)
Pre visit review using our clinic review tool, if applicable. No additional management support is needed unless otherwise documented below in the visit note. 

## 2016-08-17 NOTE — Progress Notes (Signed)
Subjective:  I acted as a Education administrator for Dr. Royden Purl, LPN    Patient ID: Jeanne [name], female    DOB: 29-Jun-1980, 36 y.o.   MRN: [id number]  Chief Complaint  Patient presents with  . Follow-up    Blood pressure    Hypertension  This is a chronic problem. The current episode started more than 1 year ago. The problem is controlled. Pertinent negatives include no blurred vision, chest pain, headaches or palpitations.    Patient is in today for blood pressure check due to having a high blood pressure reading at the dentist about 1 month ago. Patient report she came into the office the day after her dentist visit and had stable blood pressure reading x 3.  Patient Care Team: Ann Held, DO as PCP - General (Family Medicine)   No past medical history on file.  Past Surgical History:  Procedure Laterality Date  . EYE SURGERY Bilateral 2006    Family History  Problem Relation Age of Onset  . Thyroid disease Mother   . Fibroids Mother   . Deep vein thrombosis Mother   . Cancer Maternal Grandmother     liver   . Cancer Maternal Grandfather     pancreatic  . Cancer Paternal Grandfather     prostate    Social History   Social History  . Marital status: Married    Spouse name: N/A  . Number of children: N/A  . Years of education: N/A   Occupational History  .  [employer]    4 th grade   Social History Main Topics  . Smoking status: Never Smoker  . Smokeless tobacco: Never Used  . Alcohol use No  . Drug use: No  . Sexual activity: Yes    Partners: Male   Other Topics Concern  . Not on file   Social History Narrative   Exercise--- walk in summer--- its been difficult during school year    Outpatient Medications Prior to Visit  Medication Sig Dispense Refill  . albuterol (PROAIR HFA) 108 (90 Base) MCG/ACT inhaler Inhale 2 puffs into the lungs every 6 (six) hours as needed for wheezing or shortness of breath. 1 Inhaler 5  . Albuterol  Sulfate (PROAIR RESPICLICK) 782 (90 BASE) MCG/ACT AEPB Inhale 2 puffs into the lungs 4 (four) times daily as needed. 1 each 3  . Cholecalciferol (VITAMIN D PO) Take 1 tablet by mouth daily.    [name] moxifloxacin (VIGAMOX) 0.5 % ophthalmic solution Place 1 drop into both eyes 3 (three) times daily. 3 mL 0  . Potassium 99 MG TABS Take 99 mg by mouth daily.    . sertraline (ZOLOFT) 50 MG tablet Take 75 mg by mouth daily.    [name] triamcinolone cream (KENALOG) 0.1 % Apply 1 application topically 2 (two) times daily. 15 g 3   No facility-administered medications prior to visit.     No Known Allergies  Review of Systems  Constitutional: Negative for fever.  HENT: Negative for congestion.   Eyes: Negative for blurred vision.  Respiratory: Negative for cough.   Cardiovascular: Negative for chest pain and palpitations.  Gastrointestinal: Negative for vomiting.  Musculoskeletal: Negative for back pain.  Skin: Negative for rash.  Neurological: Negative for loss of consciousness and headaches.       Objective:    Physical Exam  Constitutional: She is oriented to person, place, and time. She appears well-developed and well-nourished. No distress.  HENT:  Head: Normocephalic  and atraumatic.  Eyes: Conjunctivae are normal. Pupils are equal, round, and reactive to light.  Neck: Normal range of motion. No thyromegaly present.  Cardiovascular: Normal rate and regular rhythm.   Pulmonary/Chest: Effort normal and breath sounds normal. She has no wheezes.  Abdominal: Soft. Bowel sounds are normal. There is no tenderness.  Musculoskeletal: Normal range of motion. She exhibits no edema or deformity.  Neurological: She is alert and oriented to person, place, and time.  Skin: Skin is warm and dry. She is not diaphoretic.  Psychiatric: She has a normal mood and affect.  ekg --- sinus tachy  BP 112/82 (BP Location: Left Arm, Patient Position: Sitting, Cuff Size: Large)   Pulse (!) 108   Temp 99 F (37.2 C)  (Oral)   Ht 5\' 2"  (1.575 m)   Wt 277 lb 8 oz (125.9 kg)   SpO2 95%   BMI 50.76 kg/m  Wt Readings from Last 3 Encounters:  08/17/16 277 lb 8 oz (125.9 kg)  07/25/16 274 lb 12.8 oz (124.6 kg)  03/31/16 276 lb 9.6 oz (125.5 kg)      Immunization History  Administered Date(s) Administered  . Influenza-Unspecified 02/27/2016    Health Maintenance  Topic Date Due  . HIV Screening  11/20/1997  . PAP SMEAR  03/31/2017  . TETANUS/TDAP  10/20/2024  . INFLUENZA VACCINE  Addressed    Lab Results  Component Value Date   WBC 9.9 01/10/2016   HGB 14.5 01/10/2016   HCT 42.1 01/10/2016   PLT 310.0 01/10/2016   GLUCOSE 104 (H) 04/03/2016   CHOL 204 (H) 03/04/2015   TRIG 83.0 03/04/2015   HDL 58.40 03/04/2015   LDLCALC 129 (H) 03/04/2015   ALT 13 01/10/2016   AST 13 01/10/2016   NA 139 04/03/2016   K 3.6 04/03/2016   CL 104 04/03/2016   CREATININE 0.65 04/03/2016   BUN 14 04/03/2016   CO2 25 04/03/2016   TSH 2.62 03/04/2015    Lab Results  Component Value Date   TSH 2.62 03/04/2015   Lab Results  Component Value Date   WBC 9.9 01/10/2016   HGB 14.5 01/10/2016   HCT 42.1 01/10/2016   MCV 90.5 01/10/2016   PLT 310.0 01/10/2016   Lab Results  Component Value Date   NA 139 04/03/2016   K 3.6 04/03/2016   CO2 25 04/03/2016   GLUCOSE 104 (H) 04/03/2016   BUN 14 04/03/2016   CREATININE 0.65 04/03/2016   BILITOT 0.3 01/10/2016   ALKPHOS 62 01/10/2016   AST 13 01/10/2016   ALT 13 01/10/2016   PROT 7.0 01/10/2016   ALBUMIN 3.8 01/10/2016   CALCIUM 9.2 04/03/2016   GFR 111.32 04/03/2016   Lab Results  Component Value Date   CHOL 204 (H) 03/04/2015   Lab Results  Component Value Date   HDL 58.40 03/04/2015   Lab Results  Component Value Date   LDLCALC 129 (H) 03/04/2015   Lab Results  Component Value Date   TRIG 83.0 03/04/2015   Lab Results  Component Value Date   CHOLHDL 3 03/04/2015   No results found for: HGBA1C       Assessment & Plan:    Problem List Items Addressed This Visit    None    Visit Diagnoses    Tachycardia    -  Primary   Relevant Medications   metoprolol succinate (TOPROL-XL) 25 MG 24 hr tablet   Other Relevant Orders   EKG 12-Lead (Completed)   CBC  with Differential/Platelet   Comprehensive metabolic panel   T3, free   T4, free   TSH   ECHOCARDIOGRAM COMPLETE   Cardiac event monitor    if any chest pain or sob --- rto or go to ER   I am having Ms. Gronewold start on metoprolol succinate. I am also having her maintain her Albuterol Sulfate, Cholecalciferol (VITAMIN D PO), albuterol, moxifloxacin, triamcinolone cream, sertraline, and Potassium.  Meds ordered this encounter  Medications  . metoprolol succinate (TOPROL-XL) 25 MG 24 hr tablet    Sig: Take 1 tablet (25 mg total) by mouth daily.    Dispense:  90 tablet    Refill:  3    CMA served as scribe during this visit. History, Physical and Plan performed by medical provider. Documentation and orders reviewed and attested to.  Ann Held, DO   Patient ID: Jeanne [name], female   DOB: September 15, 1980, 36 y.o.   MRN: [id number]

## 2016-08-17 NOTE — Telephone Encounter (Signed)
Patient request a call @ [phone] when it is decided if she needs an [place].

## 2016-08-17 NOTE — Patient Instructions (Signed)

## 2016-08-18 LAB — COMPREHENSIVE METABOLIC PANEL
ALT: 26 U/L (ref 0–35)
AST: 23 U/L (ref 0–37)
Albumin: 4.3 g/dL (ref 3.5–5.2)
Alkaline Phosphatase: 79 U/L (ref 39–117)
BUN: 11 mg/dL (ref 6–23)
CALCIUM: 9.6 mg/dL (ref 8.4–10.5)
CHLORIDE: 100 meq/L (ref 96–112)
CO2: 23 meq/L (ref 19–32)
Creatinine, Ser: 0.59 mg/dL (ref 0.40–1.20)
GFR: 124.2 mL/min (ref >60.00)
GLUCOSE: 84 mg/dL (ref 70–99)
Potassium: 3.8 mEq/L (ref 3.5–5.1)
SODIUM: 133 meq/L — AB (ref 135–145)
Total Bilirubin: 0.5 mg/dL (ref 0.2–1.2)
Total Protein: 7.3 g/dL (ref 6.0–8.3)

## 2016-08-18 LAB — CBC WITH DIFFERENTIAL/PLATELET
BASOS PCT: 1.2 % (ref 0.0–3.0)
Basophils Absolute: 0.1 10*3/uL (ref 0.0–0.1)
EOS ABS: 0.8 10*3/uL — AB (ref 0.0–0.7)
Eosinophils Relative: 8.6 % — ABNORMAL HIGH (ref 0.0–5.0)
HEMATOCRIT: 44.2 % (ref 36.0–46.0)
Hemoglobin: 15 g/dL (ref 12.0–15.0)
LYMPHS ABS: 2.6 10*3/uL (ref 0.7–4.0)
Lymphocytes Relative: 28.1 % (ref 12.0–46.0)
MCHC: 34 g/dL (ref 30.0–36.0)
MCV: 92.9 fl (ref 78.0–100.0)
MONO ABS: 0.6 10*3/uL (ref 0.1–1.0)
Monocytes Relative: 6.6 % (ref 3.0–12.0)
NEUTROS ABS: 5.2 10*3/uL (ref 1.4–7.7)
NEUTROS PCT: 55.5 % (ref 43.0–77.0)
PLATELETS: 369 10*3/uL (ref 150.0–400.0)
RBC: 4.76 Mil/uL (ref 3.87–5.11)
RDW: 13.3 % (ref 11.5–15.5)
WBC: 9.4 10*3/uL (ref 4.0–10.5)

## 2016-08-18 LAB — T3, FREE: T3, Free: 3.1 pg/mL (ref 2.3–4.2)

## 2016-08-18 LAB — T4, FREE: FREE T4: 0.9 ng/dL (ref 0.60–1.60)

## 2016-08-18 LAB — TSH: TSH: 2.28 u[IU]/mL (ref 0.35–4.50)

## 2016-08-23 NOTE — Telephone Encounter (Signed)
See 08/18/16 patient email.

## 2016-08-30 ENCOUNTER — Telehealth: Payer: Self-pay | Admitting: Family Medicine

## 2016-08-30 NOTE — Telephone Encounter (Signed)
-----   Message from Jeanie Sewer sent at 08/30/2016  9:35 AM EDT ----- 08/30/2016 tried to call pt no answer left a 3rd message on pt machine to call and schedule the monitor appt. stpegram 08/22/2016 LMOM for pt to call our office to schedule monitor appt. stpegram 08/21/2016 LMOM for pt to call and schedule monitor appt. stpegram  I have not got a response from your patient to get the monitor scheduled.  I have removed the request from my basket.   Thanks  Davy Pique   ----- Message ----- From: Margot Ables Sent: 08/18/2016  10:21 AM To: Nestor Ramp Pegram  Please call to schedule pt for cardiac event monitoring. Thank you.

## 2016-08-31 ENCOUNTER — Telehealth (HOSPITAL_COMMUNITY): Payer: Self-pay | Admitting: Family Medicine

## 2016-08-31 ENCOUNTER — Ambulatory Visit (HOSPITAL_COMMUNITY): Payer: BC Managed Care – PPO | Attending: Family Medicine

## 2016-10-01 ENCOUNTER — Other Ambulatory Visit: Payer: Self-pay | Admitting: Family Medicine

## 2016-10-01 DIAGNOSIS — J4520 Mild intermittent asthma, uncomplicated: Secondary | ICD-10-CM

## 2016-11-03 ENCOUNTER — Telehealth: Payer: Self-pay | Admitting: Family Medicine

## 2016-11-03 MED ORDER — BECLOMETHASONE DIPROPIONATE 40 MCG/ACT IN AERS: 2.0000 | INHALATION_SPRAY | Freq: Two times a day (BID) | RESPIRATORY_TRACT | 3 refills | Status: DC

## 2016-11-03 NOTE — Telephone Encounter (Signed)
Sent in qvar

## 2016-11-03 NOTE — Telephone Encounter (Signed)
PHarmacy states the patient has been using her rescue inhaler every night and has expressed interest in starting a controller therapy if PCP feels this is appropriate In the past she used QVAR---last filled jan. 2017 Pharmacy [facility] [address]

## 2016-11-03 NOTE — Telephone Encounter (Signed)
Ok to send in qvar 40 mg 2 puffs bid  3 refills Needs ov if symptoms persist

## 2016-11-23 ENCOUNTER — Encounter: Payer: Self-pay | Admitting: Family Medicine

## 2016-11-23 ENCOUNTER — Ambulatory Visit: Payer: Self-pay | Admitting: Family Medicine

## 2016-11-28 ENCOUNTER — Telehealth: Payer: Self-pay | Admitting: *Deleted

## 2016-11-28 NOTE — Telephone Encounter (Signed)
Received Physician Orders from [facility] from 07/24/16, forwarded to provider/SLS [phone]

## 2016-12-11 ENCOUNTER — Other Ambulatory Visit: Payer: Self-pay

## 2016-12-11 ENCOUNTER — Ambulatory Visit (HOSPITAL_COMMUNITY): Payer: BC Managed Care – PPO | Attending: Cardiovascular Disease

## 2016-12-11 DIAGNOSIS — R000 Tachycardia, unspecified: Secondary | ICD-10-CM | POA: Diagnosis not present

## 2016-12-11 DIAGNOSIS — I10 Essential (primary) hypertension: Secondary | ICD-10-CM | POA: Diagnosis not present

## 2016-12-15 ENCOUNTER — Ambulatory Visit (INDEPENDENT_AMBULATORY_CARE_PROVIDER_SITE_OTHER): Payer: BC Managed Care – PPO | Admitting: Family Medicine

## 2016-12-15 ENCOUNTER — Encounter: Payer: Self-pay | Admitting: Family Medicine

## 2016-12-15 DIAGNOSIS — I10 Essential (primary) hypertension: Secondary | ICD-10-CM

## 2016-12-15 DIAGNOSIS — J45909 Unspecified asthma, uncomplicated: Secondary | ICD-10-CM | POA: Insufficient documentation

## 2016-12-15 DIAGNOSIS — R000 Tachycardia, unspecified: Secondary | ICD-10-CM | POA: Diagnosis not present

## 2016-12-15 DIAGNOSIS — J4530 Mild persistent asthma, uncomplicated: Secondary | ICD-10-CM | POA: Diagnosis not present

## 2016-12-15 MED ORDER — METOPROLOL SUCCINATE ER 25 MG PO TB24: 25.0000 mg | ORAL_TABLET | Freq: Every day | ORAL | 3 refills | Status: DC

## 2016-12-15 NOTE — Patient Instructions (Signed)
Sinus Tachycardia °Sinus tachycardia is a kind of fast heartbeat. In sinus tachycardia, the heart beats more than 100 times a minute. Sinus tachycardia starts in a part of the heart called the sinus node. Sinus tachycardia may be harmless, or it may be a sign of a serious condition. °What are the causes? °This condition may be caused by: °· Exercise or exertion. °· A fever. °· Pain. °· Loss of body fluids (dehydration). °· Severe bleeding (hemorrhage). °· Anxiety and stress. °· Certain substances, including: °? Alcohol. °? Caffeine. °? Tobacco and nicotine products. °? Diet pills. °? Illegal drugs. °· Medical conditions including: °? Heart disease. °? An infection. °? An overactive thyroid (hyperthyroidism). °? A lack of red blood cells (anemia). ° °What are the signs or symptoms? °Symptoms of this condition include: °· A feeling that the heart is beating quickly (palpitations). °· Suddenly noticing your heartbeat (cardiac awareness). °· Dizziness. °· Tiredness (fatigue). °· Shortness of breath. °· Chest pain. °· Nausea. °· Fainting. ° °How is this diagnosed? °This condition is diagnosed with: °· A physical exam. °· Other tests, such as: °? Blood tests. °? An electrocardiogram (ECG). This test measures the electrical activity of the heart. °? Holter monitoring. For this test, you wear a device that records your heartbeat for one or more days. ° °You may be referred to a heart specialist (cardiologist). °How is this treated? °Treatment for this condition depends on the cause or underlying condition. Treatment may involve: °· Treating the underlying condition. °· Taking new medicines or changing your current medicines as told by your health care provider. °· Making changes to your diet or lifestyle. °· Practicing relaxation methods. ° °Follow these instructions at home: °Lifestyle °· Do not use any products that contain nicotine or tobacco, such as cigarettes and e-cigarettes. If you need help quitting, ask your  health care provider. °· Learn relaxation methods, like deep breathing, to help you when you get stressed or anxious. °· Do not use illegal drugs, such as cocaine. °· Do not abuse alcohol. Limit alcohol intake to no more than 1 drink a day for non-pregnant women and 2 drinks a day for men. One drink equals 12 oz of beer, 5 oz of wine, or 1½ oz of hard liquor. °· Find time to rest and relax often. This reduces stress. °· Avoid: °? Caffeine. °? Stimulants such as over-the-counter diet pills or pills that help you to stay awake. °? Situations that cause anxiety or stress. °General instructions °· Drink enough fluids to keep your urine clear or pale yellow. °· Take over-the-counter and prescription medicines only as told by your health care provider. °· Keep all follow-up visits as told by your health care provider. This is important. °Contact a health care provider if: °· You have a fever. °· You have vomiting or diarrhea that keeps happening (is persistent). °Get help right away if: °· You have pain in your chest, upper arms, jaw, or neck. °· You become weak or dizzy. °· You feel faint. °· You have palpitations that do not go away. °This information is not intended to replace advice given to you by your health care provider. Make sure you discuss any questions you have with your health care provider. °Document Released: 06/29/2004 Document Revised: 12/18/2015 Document Reviewed: 12/04/2014 °Elsevier Interactive Patient Education © 2018 [employer] Inc[employer] ° °

## 2016-12-15 NOTE — Assessment & Plan Note (Signed)
con't qvar and albuterol prn

## 2016-12-15 NOTE — Progress Notes (Signed)
Patient ID: Jeanne [name], female   DOB: 02-Jul-1980, 36 y.o.   MRN: [id number]     Subjective:  I acted as a Education administrator for Dr. Carollee Herter.  Guerry Bruin, [place]   Patient ID: Jeanne [name], female    DOB: 1981-01-05, 36 y.o.   MRN: [id number]  Chief Complaint  Patient presents with  . Tachycardia    follow up  . Asthma    HPI  Patient is in today for follow up tachycardia and to discuss asthma.  She wants to know if she should be taking the QVAR every day.  Patient Care Team: Carollee Herter, Alferd Apa, DO as PCP - General (Family Medicine)   No past medical history on file.  Past Surgical History:  Procedure Laterality Date  . EYE SURGERY Bilateral 2006    Family History  Problem Relation Age of Onset  . Thyroid disease Mother   . Fibroids Mother   . Deep vein thrombosis Mother   . Cancer Maternal Grandmother        liver   . Cancer Maternal Grandfather        pancreatic  . Cancer Paternal Grandfather        prostate    Social History   Social History  . Marital status: Married    Spouse name: N/A  . Number of children: N/A  . Years of education: N/A   Occupational History  .  [employer]    4 th grade   Social History Main Topics  . Smoking status: Never Smoker  . Smokeless tobacco: Never Used  . Alcohol use No  . Drug use: No  . Sexual activity: Yes    Partners: Male   Other Topics Concern  . Not on file   Social History Narrative   Exercise--- walk in summer--- its been difficult during school year    Outpatient Medications Prior to Visit  Medication Sig Dispense Refill  . PROAIR HFA 108 (90 Base) MCG/ACT inhaler INHALE 2 PUFFS INTO THE LUNGS EVERY 6 (SIX) HOURS AS NEEDED FOR WHEEZING OR SHORTNESS OF BREATH. 8.5 Inhaler 1  . sertraline (ZOLOFT) 50 MG tablet Take 100 mg by mouth daily.     [name] triamcinolone cream (KENALOG) 0.1 % Apply 1 application topically 2 (two) times daily. 15 g 3  . beclomethasone (QVAR) 40 MCG/ACT inhaler Inhale 2 puffs  into the lungs 2 (two) times daily. (Patient not taking: Reported on 12/15/2016) 1 Inhaler 3  . Albuterol Sulfate (PROAIR RESPICLICK) 254 (90 BASE) MCG/ACT AEPB Inhale 2 puffs into the lungs 4 (four) times daily as needed. 1 each 3  . Cholecalciferol (VITAMIN D PO) Take 1 tablet by mouth daily.    . metoprolol succinate (TOPROL-XL) 25 MG 24 hr tablet Take 1 tablet (25 mg total) by mouth daily. 90 tablet 3  . moxifloxacin (VIGAMOX) 0.5 % ophthalmic solution Place 1 drop into both eyes 3 (three) times daily. 3 mL 0  . Potassium 99 MG TABS Take 99 mg by mouth daily.     No facility-administered medications prior to visit.     No Known Allergies  Review of Systems  Constitutional: Negative for fever and malaise/fatigue.  HENT: Negative for congestion.   Eyes: Negative for blurred vision.  Respiratory: Negative for cough and shortness of breath.   Cardiovascular: Negative for chest pain, palpitations and leg swelling.       Tachycardia   Gastrointestinal: Negative for vomiting.  Musculoskeletal: Negative for back pain.  Skin: Negative for  rash.  Neurological: Negative for loss of consciousness and headaches.       Objective:    Physical Exam  Constitutional: She is oriented to person, place, and time. She appears well-developed and well-nourished.  HENT:  Head: Normocephalic and atraumatic.  Eyes: Conjunctivae and EOM are normal.  Neck: Normal range of motion. Neck supple. No JVD present. Carotid bruit is not present. No thyromegaly present.  Cardiovascular: Normal rate, regular rhythm and normal heart sounds.   No murmur heard. Pulmonary/Chest: Effort normal and breath sounds normal. No respiratory distress. She has no wheezes. She has no rales. She exhibits no tenderness.  Abdominal: Soft. She exhibits no distension. There is no tenderness. There is no rebound, no guarding and no CVA tenderness.  Genitourinary: Pelvic exam was performed with patient supine. There is no rash,  tenderness, lesion or injury on the right labia. There is no rash, tenderness, lesion or injury on the left labia. No erythema or tenderness in the vagina. No vaginal discharge found.  Musculoskeletal: She exhibits no edema.  Neurological: She is alert and oriented to person, place, and time.  Psychiatric: She has a normal mood and affect.  Nursing note and vitals reviewed.   BP (!) 130/99   Pulse (!) 104   Temp 98.1 F (36.7 C) (Oral)   Resp 16   Ht 5\' 2"  (1.575 m)   Wt 280 lb (127 kg)   LMP 12/01/2016   SpO2 95%   BMI 51.21 kg/m  Wt Readings from Last 3 Encounters:  12/15/16 280 lb (127 kg)  08/17/16 277 lb 8 oz (125.9 kg)  07/25/16 274 lb 12.8 oz (124.6 kg)   BP Readings from Last 3 Encounters:  12/15/16 (!) 130/99  08/17/16 112/82  07/25/16 120/82     Immunization History  Administered Date(s) Administered  . Influenza-Unspecified 02/27/2016    Health Maintenance  Topic Date Due  . HIV Screening  11/20/1997  . INFLUENZA VACCINE  01/03/2017  . PAP SMEAR  03/31/2017  . TETANUS/TDAP  10/20/2024    Lab Results  Component Value Date   WBC 9.4 08/17/2016   HGB 15.0 08/17/2016   HCT 44.2 08/17/2016   PLT 369.0 08/17/2016   GLUCOSE 84 08/17/2016   CHOL 204 (H) 03/04/2015   TRIG 83.0 03/04/2015   HDL 58.40 03/04/2015   LDLCALC 129 (H) 03/04/2015   ALT 26 08/17/2016   AST 23 08/17/2016   NA 133 (L) 08/17/2016   K 3.8 08/17/2016   CL 100 08/17/2016   CREATININE 0.59 08/17/2016   BUN 11 08/17/2016   CO2 23 08/17/2016   TSH 2.28 08/17/2016    Lab Results  Component Value Date   TSH 2.28 08/17/2016   Lab Results  Component Value Date   WBC 9.4 08/17/2016   HGB 15.0 08/17/2016   HCT 44.2 08/17/2016   MCV 92.9 08/17/2016   PLT 369.0 08/17/2016   Lab Results  Component Value Date   NA 133 (L) 08/17/2016   K 3.8 08/17/2016   CO2 23 08/17/2016   GLUCOSE 84 08/17/2016   BUN 11 08/17/2016   CREATININE 0.59 08/17/2016   BILITOT 0.5 08/17/2016    ALKPHOS 79 08/17/2016   AST 23 08/17/2016   ALT 26 08/17/2016   PROT 7.3 08/17/2016   ALBUMIN 4.3 08/17/2016   CALCIUM 9.6 08/17/2016   GFR 124.20 08/17/2016   Lab Results  Component Value Date   CHOL 204 (H) 03/04/2015   Lab Results  Component Value Date  HDL 58.40 03/04/2015   Lab Results  Component Value Date   LDLCALC 129 (H) 03/04/2015   Lab Results  Component Value Date   TRIG 83.0 03/04/2015   Lab Results  Component Value Date   CHOLHDL 3 03/04/2015   No results found for: HGBA1C       Assessment & Plan:   Problem List Items Addressed This Visit      Unprioritized   Asthma    con't qvar and albuterol prn       HTN (hypertension)    Restart metolprolol Dash diet  rto 3 months      Relevant Medications   metoprolol succinate (TOPROL-XL) 25 MG 24 hr tablet    Other Visit Diagnoses    Tachycardia       Relevant Medications   metoprolol succinate (TOPROL-XL) 25 MG 24 hr tablet   Other Relevant Orders   Cardiac event monitor      I have discontinued Ms. Eblin's Albuterol Sulfate, Cholecalciferol (VITAMIN D PO), moxifloxacin, and Potassium. I am also having her maintain her triamcinolone cream, sertraline, PROAIR HFA, beclomethasone, Prenatal Vit-Fe Fumarate-FA (PRENATAL VITAMIN PO), and metoprolol succinate.  Meds ordered this encounter  Medications  . Prenatal Vit-Fe Fumarate-FA (PRENATAL VITAMIN PO)    Sig: Take 1 tablet by mouth daily.  . metoprolol succinate (TOPROL-XL) 25 MG 24 hr tablet    Sig: Take 1 tablet (25 mg total) by mouth daily.    Dispense:  90 tablet    Refill:  3    CMA served as scribe during this visit. History, Physical and Plan performed by medical provider. Documentation and orders reviewed and attested to.  Ann Held, DO

## 2016-12-15 NOTE — Assessment & Plan Note (Signed)
Restart metolprolol Dash diet  rto 3 months

## 2017-01-16 ENCOUNTER — Ambulatory Visit (INDEPENDENT_AMBULATORY_CARE_PROVIDER_SITE_OTHER): Payer: BC Managed Care – PPO

## 2017-01-16 DIAGNOSIS — R000 Tachycardia, unspecified: Secondary | ICD-10-CM | POA: Diagnosis not present

## 2017-02-13 ENCOUNTER — Encounter: Payer: Self-pay | Admitting: Family Medicine

## 2017-02-13 NOTE — Telephone Encounter (Signed)
If she has been using drops for a week it should be better--- sounds like she needs to see eye dr

## 2017-02-19 ENCOUNTER — Other Ambulatory Visit: Payer: Self-pay | Admitting: Family Medicine

## 2017-03-09 ENCOUNTER — Ambulatory Visit: Payer: Self-pay | Admitting: Family Medicine

## 2017-03-09 DIAGNOSIS — Z0289 Encounter for other administrative examinations: Secondary | ICD-10-CM

## 2017-03-29 ENCOUNTER — Encounter: Payer: Self-pay | Admitting: Family Medicine

## 2017-03-30 ENCOUNTER — Ambulatory Visit: Payer: Self-pay | Admitting: Family Medicine

## 2017-04-05 ENCOUNTER — Encounter: Payer: Self-pay | Admitting: Family Medicine

## 2017-04-12 ENCOUNTER — Encounter: Payer: Self-pay | Admitting: Family Medicine

## 2017-04-20 ENCOUNTER — Encounter: Payer: Self-pay | Admitting: Family Medicine

## 2017-04-20 MED ORDER — METOPROLOL SUCCINATE ER 50 MG PO TB24: 50.0000 mg | ORAL_TABLET | Freq: Every day | ORAL | 3 refills | Status: DC

## 2017-04-20 NOTE — Telephone Encounter (Signed)
Ok to send in toprol xl 50 mg #90 1 po qd , 3 refills

## 2017-07-02 ENCOUNTER — Ambulatory Visit: Payer: Self-pay | Admitting: Family Medicine

## 2017-07-03 ENCOUNTER — Ambulatory Visit: Payer: BC Managed Care – PPO | Admitting: Family Medicine

## 2017-07-03 ENCOUNTER — Encounter: Payer: Self-pay | Admitting: Family Medicine

## 2017-07-03 VITALS — BP 116/88 | HR 93 | Temp 98.0°F | Resp 16 | Ht 62.0 in | Wt 293.8 lb

## 2017-07-03 DIAGNOSIS — I10 Essential (primary) hypertension: Secondary | ICD-10-CM | POA: Diagnosis not present

## 2017-07-03 DIAGNOSIS — L309 Dermatitis, unspecified: Secondary | ICD-10-CM | POA: Diagnosis not present

## 2017-07-03 DIAGNOSIS — R000 Tachycardia, unspecified: Secondary | ICD-10-CM | POA: Diagnosis not present

## 2017-07-03 MED ORDER — TRIAMCINOLONE ACETONIDE 0.1 % EX CREA: 1.0000 "application " | TOPICAL_CREAM | Freq: Two times a day (BID) | CUTANEOUS | 3 refills | Status: DC

## 2017-07-03 MED ORDER — METOPROLOL SUCCINATE ER 50 MG PO TB24: 50.0000 mg | ORAL_TABLET | Freq: Every day | ORAL | 3 refills | Status: DC

## 2017-07-03 NOTE — Patient Instructions (Signed)
DASH Eating Plan DASH stands for "Dietary Approaches to Stop Hypertension." The DASH eating plan is a healthy eating plan that has been shown to reduce high blood pressure (hypertension). It may also reduce your risk for type 2 diabetes, heart disease, and stroke. The DASH eating plan may also help with weight loss. What are tips for following this plan? General guidelines  Avoid eating more than 2,300 mg (milligrams) of salt (sodium) a day. If you have hypertension, you may need to reduce your sodium intake to 1,500 mg a day.  Limit alcohol intake to no more than 1 drink a day for nonpregnant women and 2 drinks a day for men. One drink equals 12 oz of beer, 5 oz of wine, or 1 oz of hard liquor.  Work with your health care provider to maintain a healthy body weight or to lose weight. Ask what an ideal weight is for you.  Get at least 30 minutes of exercise that causes your heart to beat faster (aerobic exercise) most days of the week. Activities may include walking, swimming, or biking.  Work with your health care provider or diet and nutrition specialist (dietitian) to adjust your eating plan to your individual calorie needs. Reading food labels  Check food labels for the amount of sodium per serving. Choose foods with less than 5 percent of the Daily Value of sodium. Generally, foods with less than 300 mg of sodium per serving fit into this eating plan.  To find whole grains, look for the word "whole" as the first word in the ingredient list. Shopping  Buy products labeled as "low-sodium" or "no salt added."  Buy fresh foods. Avoid canned foods and premade or frozen meals. Cooking  Avoid adding salt when cooking. Use salt-free seasonings or herbs instead of table salt or sea salt. Check with your health care provider or pharmacist before using salt substitutes.  Do not fry foods. Cook foods using healthy methods such as baking, boiling, grilling, and broiling instead.  Cook with  heart-healthy oils, such as olive, canola, soybean, or sunflower oil. Meal planning   Eat a balanced diet that includes: ? 5 or more servings of fruits and vegetables each day. At each meal, try to fill half of your plate with fruits and vegetables. ? Up to 6-8 servings of whole grains each day. ? Less than 6 oz of lean meat, poultry, or fish each day. A 3-oz serving of meat is about the same size as a deck of cards. One egg equals 1 oz. ? 2 servings of low-fat dairy each day. ? A serving of nuts, seeds, or beans 5 times each week. ? Heart-healthy fats. Healthy fats called Omega-3 fatty acids are found in foods such as flaxseeds and coldwater fish, like sardines, salmon, and mackerel.  Limit how much you eat of the following: ? Canned or prepackaged foods. ? Food that is high in trans fat, such as fried foods. ? Food that is high in saturated fat, such as fatty meat. ? Sweets, desserts, sugary drinks, and other foods with added sugar. ? Full-fat dairy products.  Do not salt foods before eating.  Try to eat at least 2 vegetarian meals each week.  Eat more home-cooked food and less restaurant, buffet, and fast food.  When eating at a restaurant, ask that your food be prepared with less salt or no salt, if possible. What foods are recommended? The items listed may not be a complete list. Talk with your dietitian about what   dietary choices are best for you. Grains Whole-grain or whole-wheat bread. Whole-grain or whole-wheat pasta. Brown rice. Oatmeal. Quinoa. Bulgur. Whole-grain and low-sodium cereals. Pita bread. Low-fat, low-sodium crackers. Whole-wheat flour tortillas. Vegetables Fresh or frozen vegetables (raw, steamed, roasted, or grilled). Low-sodium or reduced-sodium tomato and vegetable juice. Low-sodium or reduced-sodium tomato sauce and tomato paste. Low-sodium or reduced-sodium canned vegetables. Fruits All fresh, dried, or frozen fruit. Canned fruit in natural juice (without  added sugar). Meat and other protein foods Skinless chicken or turkey. Ground chicken or turkey. Pork with fat trimmed off. Fish and seafood. Egg whites. Dried beans, peas, or lentils. Unsalted nuts, nut butters, and seeds. Unsalted canned beans. Lean cuts of beef with fat trimmed off. Low-sodium, lean deli meat. Dairy Low-fat (1%) or fat-free (skim) milk. Fat-free, low-fat, or reduced-fat cheeses. Nonfat, low-sodium ricotta or cottage cheese. Low-fat or nonfat yogurt. Low-fat, low-sodium cheese. Fats and oils Soft margarine without trans fats. Vegetable oil. Low-fat, reduced-fat, or light mayonnaise and salad dressings (reduced-sodium). Canola, safflower, olive, soybean, and sunflower oils. Avocado. Seasoning and other foods Herbs. Spices. Seasoning mixes without salt. Unsalted popcorn and pretzels. Fat-free sweets. What foods are not recommended? The items listed may not be a complete list. Talk with your dietitian about what dietary choices are best for you. Grains Baked goods made with fat, such as croissants, muffins, or some breads. Dry pasta or rice meal packs. Vegetables Creamed or fried vegetables. Vegetables in a cheese sauce. Regular canned vegetables (not low-sodium or reduced-sodium). Regular canned tomato sauce and paste (not low-sodium or reduced-sodium). Regular tomato and vegetable juice (not low-sodium or reduced-sodium). Pickles. Olives. Fruits Canned fruit in a light or heavy syrup. Fried fruit. Fruit in cream or butter sauce. Meat and other protein foods Fatty cuts of meat. Ribs. Fried meat. Bacon. Sausage. Bologna and other processed lunch meats. Salami. Fatback. Hotdogs. Bratwurst. Salted nuts and seeds. Canned beans with added salt. Canned or smoked fish. Whole eggs or egg yolks. Chicken or turkey with skin. Dairy Whole or 2% milk, cream, and half-and-half. Whole or full-fat cream cheese. Whole-fat or sweetened yogurt. Full-fat cheese. Nondairy creamers. Whipped toppings.  Processed cheese and cheese spreads. Fats and oils Butter. Stick margarine. Lard. Shortening. Ghee. Bacon fat. Tropical oils, such as coconut, palm kernel, or palm oil. Seasoning and other foods Salted popcorn and pretzels. Onion salt, garlic salt, seasoned salt, table salt, and sea salt. Worcestershire sauce. Tartar sauce. Barbecue sauce. Teriyaki sauce. Soy sauce, including reduced-sodium. Steak sauce. Canned and packaged gravies. Fish sauce. Oyster sauce. Cocktail sauce. Horseradish that you find on the shelf. Ketchup. Mustard. Meat flavorings and tenderizers. Bouillon cubes. Hot sauce and Tabasco sauce. Premade or packaged marinades. Premade or packaged taco seasonings. Relishes. Regular salad dressings. Where to find more information:  National Heart, Lung, and Blood Institute: www.nhlbi.nih.gov  American Heart Association: www.heart.org Summary  The DASH eating plan is a healthy eating plan that has been shown to reduce high blood pressure (hypertension). It may also reduce your risk for type 2 diabetes, heart disease, and stroke.  With the DASH eating plan, you should limit salt (sodium) intake to 2,300 mg a day. If you have hypertension, you may need to reduce your sodium intake to 1,500 mg a day.  When on the DASH eating plan, aim to eat more fresh fruits and vegetables, whole grains, lean proteins, low-fat dairy, and heart-healthy fats.  Work with your health care provider or diet and nutrition specialist (dietitian) to adjust your eating plan to your individual   calorie needs. This information is not intended to replace advice given to you by your health care provider. Make sure you discuss any questions you have with your health care provider. Document Released: 05/11/2011 Document Revised: 05/15/2016 Document Reviewed: 05/15/2016 Elsevier Interactive Patient Education  2018 Elsevier Inc.  

## 2017-07-03 NOTE — Assessment & Plan Note (Signed)
Stable on toprol

## 2017-07-03 NOTE — Progress Notes (Signed)
Patient ID: Jeanne [name], female   DOB: 07-12-1980, 37 y.o.   MRN: [id number]    Subjective:  I acted as a Education administrator for Dr. Carollee Herter.  Jeanne [name], [place]   Patient ID: Jeanne [name], female    DOB: [birth year], 37 y.o.   MRN: [id number]  Chief Complaint  Patient presents with  . heart monitor    go over results    HPI  Patient is in today to go over heart monitor results.  Pt has been feeling much better since increasing the toprol.  No complaints  Patient Care Team: Carollee Herter, Alferd Apa, DO as PCP - General (Family Medicine)   No past medical history on file.  Past Surgical History:  Procedure Laterality Date  . EYE SURGERY Bilateral 2006    Family History  Problem Relation Age of Onset  . Thyroid disease Mother   . Fibroids Mother   . Deep vein thrombosis Mother   . Cancer Maternal Grandmother        liver   . Cancer Maternal Grandfather        pancreatic  . Cancer Paternal Grandfather        prostate    Social History   Socioeconomic History  . Marital status: Married    Spouse name: Not on file  . Number of children: Not on file  . Years of education: Not on file  . Highest education level: Not on file  Social Needs  . Financial resource strain: Not on file  . Food insecurity - worry: Not on file  . Food insecurity - inability: Not on file  . Transportation needs - medical: Not on file  . Transportation needs - non-medical: Not on file  Occupational History    Employer: [employer]    Comment: 4 th grade  Tobacco Use  . Smoking status: Never Smoker  . Smokeless tobacco: Never Used  Substance and Sexual Activity  . Alcohol use: No    Alcohol/week: 0.0 oz  . Drug use: No  . Sexual activity: Yes    Partners: Male  Other Topics Concern  . Not on file  Social History Narrative   Exercise--- walk in summer--- its been difficult during school year    Outpatient Medications Prior to Visit  Medication Sig Dispense Refill  . beclomethasone  (QVAR) 40 MCG/ACT inhaler Inhale 2 puffs into the lungs 2 (two) times daily. 1 Inhaler 3  . Prenatal Vit-Fe Fumarate-FA (PRENATAL VITAMIN PO) Take 1 tablet by mouth daily.    [name] PROAIR HFA 108 (90 Base) MCG/ACT inhaler INHALE 2 PUFFS INTO THE LUNGS EVERY 6 (SIX) HOURS AS NEEDED FOR WHEEZING OR SHORTNESS OF BREATH. 8.5 Inhaler 1  . sertraline (ZOLOFT) 50 MG tablet Take 100 mg by mouth daily.     . metoprolol succinate (TOPROL-XL) 50 MG 24 hr tablet Take 1 tablet (50 mg total) daily by mouth. Take with or immediately following a meal. 90 tablet 3  . triamcinolone cream (KENALOG) 0.1 % Apply 1 application topically 2 (two) times daily. 15 g 3   No facility-administered medications prior to visit.     No Known Allergies  Review of Systems  Constitutional: Negative for fever and malaise/fatigue.  HENT: Negative for congestion.   Eyes: Negative for blurred vision.  Respiratory: Negative for cough and shortness of breath.   Cardiovascular: Negative for chest pain, palpitations and leg swelling.  Gastrointestinal: Negative for vomiting.  Musculoskeletal: Negative for back pain.  Skin: Negative for rash.  Neurological: Negative for loss of consciousness and headaches.       Objective:    Physical Exam  Constitutional: She is oriented to person, place, and time. She appears well-developed and well-nourished.  HENT:  Head: Normocephalic and atraumatic.  Eyes: Conjunctivae and EOM are normal.  Neck: Normal range of motion. Neck supple. No JVD present. Carotid bruit is not present. No thyromegaly present.  Cardiovascular: Normal rate, regular rhythm and normal heart sounds.  No murmur heard. Pulmonary/Chest: Effort normal and breath sounds normal. No respiratory distress. She has no wheezes. She has no rales. She exhibits no tenderness.  Musculoskeletal: She exhibits no edema.  Neurological: She is alert and oriented to person, place, and time.  Psychiatric: She has a normal mood and affect.  Her behavior is normal. Judgment and thought content normal.  Nursing note and vitals reviewed.   BP 116/88 (BP Location: Right Arm, Cuff Size: Large)   Pulse 93   Temp 98 F (36.7 C) (Oral)   Resp 16   Ht 5\' 2"  (1.575 m)   Wt 293 lb 12.8 oz (133.3 kg)   LMP 06/26/2017   SpO2 97%   BMI 53.74 kg/m  Wt Readings from Last 3 Encounters:  07/03/17 293 lb 12.8 oz (133.3 kg)  12/15/16 280 lb (127 kg)  08/17/16 277 lb 8 oz (125.9 kg)   BP Readings from Last 3 Encounters:  07/03/17 116/88  12/15/16 (!) 130/99  08/17/16 112/82     Immunization History  Administered Date(s) Administered  . Influenza-Unspecified 02/27/2016, 02/04/2017    Health Maintenance  Topic Date Due  . HIV Screening  11/20/1997  . PAP SMEAR  03/31/2017  . TETANUS/TDAP  10/20/2024  . INFLUENZA VACCINE  Completed    Lab Results  Component Value Date   WBC 9.4 08/17/2016   HGB 15.0 08/17/2016   HCT 44.2 08/17/2016   PLT 369.0 08/17/2016   GLUCOSE 84 08/17/2016   CHOL 204 (H) 03/04/2015   TRIG 83.0 03/04/2015   HDL 58.40 03/04/2015   LDLCALC 129 (H) 03/04/2015   ALT 26 08/17/2016   AST 23 08/17/2016   NA 133 (L) 08/17/2016   K 3.8 08/17/2016   CL 100 08/17/2016   CREATININE 0.59 08/17/2016   BUN 11 08/17/2016   CO2 23 08/17/2016   TSH 2.28 08/17/2016    Lab Results  Component Value Date   TSH 2.28 08/17/2016   Lab Results  Component Value Date   WBC 9.4 08/17/2016   HGB 15.0 08/17/2016   HCT 44.2 08/17/2016   MCV 92.9 08/17/2016   PLT 369.0 08/17/2016   Lab Results  Component Value Date   NA 133 (L) 08/17/2016   K 3.8 08/17/2016   CO2 23 08/17/2016   GLUCOSE 84 08/17/2016   BUN 11 08/17/2016   CREATININE 0.59 08/17/2016   BILITOT 0.5 08/17/2016   ALKPHOS 79 08/17/2016   AST 23 08/17/2016   ALT 26 08/17/2016   PROT 7.3 08/17/2016   ALBUMIN 4.3 08/17/2016   CALCIUM 9.6 08/17/2016   GFR 124.20 08/17/2016   Lab Results  Component Value Date   CHOL 204 (H) 03/04/2015   Lab  Results  Component Value Date   HDL 58.40 03/04/2015   Lab Results  Component Value Date   LDLCALC 129 (H) 03/04/2015   Lab Results  Component Value Date   TRIG 83.0 03/04/2015   Lab Results  Component Value Date   CHOLHDL 3 03/04/2015   No results found for: HGBA1C  Assessment & Plan:   Problem List Items Addressed This Visit      Unprioritized   Eczema of both hands - Primary   Relevant Medications   triamcinolone cream (KENALOG) 0.1 %   HTN (hypertension)    Well controlled, no changes to meds. Encouraged heart healthy diet such as the DASH diet and exercise as tolerated.       Relevant Medications   metoprolol succinate (TOPROL-XL) 50 MG 24 hr tablet   Other Relevant Orders   CBC with Differential/Platelet   Lipid panel   Comprehensive metabolic panel   Sinus tachycardia    Stable on toprol      Relevant Medications   metoprolol succinate (TOPROL-XL) 50 MG 24 hr tablet   Other Relevant Orders   CBC with Differential/Platelet   Lipid panel   Comprehensive metabolic panel      I have changed Frederick Mower's metoprolol succinate. I am also having her maintain her sertraline, PROAIR HFA, beclomethasone, Prenatal Vit-Fe Fumarate-FA (PRENATAL VITAMIN PO), and triamcinolone cream.  Meds ordered this encounter  Medications  . triamcinolone cream (KENALOG) 0.1 %    Sig: Apply 1 application topically 2 (two) times daily.    Dispense:  15 g    Refill:  3  . metoprolol succinate (TOPROL-XL) 50 MG 24 hr tablet    Sig: Take 1 tablet (50 mg total) by mouth daily. Take with or immediately following a meal.    Dispense:  90 tablet    Refill:  3    CMA served as scribe during this visit. History, Physical and Plan performed by medical provider. Documentation and orders reviewed and attested to.  Ann Held, DO

## 2017-07-03 NOTE — Assessment & Plan Note (Signed)
Well controlled, no changes to meds. Encouraged heart healthy diet such as the DASH diet and exercise as tolerated.  °

## 2017-07-04 LAB — CBC WITH DIFFERENTIAL/PLATELET
BASOS ABS: 0.1 10*3/uL (ref 0.0–0.1)
Basophils Relative: 1.3 % (ref 0.0–3.0)
EOS PCT: 6 % — AB (ref 0.0–5.0)
Eosinophils Absolute: 0.5 10*3/uL (ref 0.0–0.7)
HEMATOCRIT: 43.2 % (ref 36.0–46.0)
Hemoglobin: 14.6 g/dL (ref 12.0–15.0)
LYMPHS PCT: 30.9 % (ref 12.0–46.0)
Lymphs Abs: 2.6 10*3/uL (ref 0.7–4.0)
MCHC: 33.9 g/dL (ref 30.0–36.0)
MCV: 90.9 fl (ref 78.0–100.0)
MONOS PCT: 6.6 % (ref 3.0–12.0)
Monocytes Absolute: 0.5 10*3/uL (ref 0.1–1.0)
Neutro Abs: 4.6 10*3/uL (ref 1.4–7.7)
Neutrophils Relative %: 55.2 % (ref 43.0–77.0)
Platelets: 354 10*3/uL (ref 150.0–400.0)
RBC: 4.75 Mil/uL (ref 3.87–5.11)
RDW: 13.5 % (ref 11.5–15.5)
WBC: 8.3 10*3/uL (ref 4.0–10.5)

## 2017-07-04 LAB — COMPREHENSIVE METABOLIC PANEL
ALT: 22 U/L (ref 0–35)
AST: 18 U/L (ref 0–37)
Albumin: 4.3 g/dL (ref 3.5–5.2)
Alkaline Phosphatase: 72 U/L (ref 39–117)
BUN: 11 mg/dL (ref 6–23)
CO2: 25 meq/L (ref 19–32)
Calcium: 9 mg/dL (ref 8.4–10.5)
Chloride: 105 mEq/L (ref 96–112)
Creatinine, Ser: 0.6 mg/dL (ref 0.40–1.20)
GFR: 121.19 mL/min (ref >60.00)
GLUCOSE: 83 mg/dL (ref 70–99)
POTASSIUM: 4 meq/L (ref 3.5–5.1)
SODIUM: 138 meq/L (ref 135–145)
Total Bilirubin: 0.4 mg/dL (ref 0.2–1.2)
Total Protein: 7.4 g/dL (ref 6.0–8.3)

## 2017-07-04 LAB — LIPID PANEL
CHOL/HDL RATIO: 4
Cholesterol: 187 mg/dL (ref 0–200)
HDL: 45.3 mg/dL (ref >39.00)
LDL Cholesterol: 122 mg/dL — ABNORMAL HIGH (ref 0–99)
NONHDL: 141.97
Triglycerides: 98 mg/dL (ref 0.0–149.0)
VLDL: 19.6 mg/dL (ref 0.0–40.0)

## 2017-07-06 ENCOUNTER — Encounter: Payer: Self-pay | Admitting: Family Medicine

## 2017-07-30 ENCOUNTER — Ambulatory Visit: Payer: BC Managed Care – PPO | Admitting: Family Medicine

## 2017-07-30 ENCOUNTER — Encounter: Payer: Self-pay | Admitting: Family Medicine

## 2017-07-30 VITALS — BP 126/88 | HR 95 | Temp 99.5°F | Resp 16 | Ht 62.0 in | Wt 301.4 lb

## 2017-07-30 DIAGNOSIS — N920 Excessive and frequent menstruation with regular cycle: Secondary | ICD-10-CM | POA: Diagnosis not present

## 2017-07-30 DIAGNOSIS — R1030 Lower abdominal pain, unspecified: Secondary | ICD-10-CM | POA: Diagnosis not present

## 2017-07-30 LAB — POC URINALSYSI DIPSTICK (AUTOMATED)
Bilirubin, UA: NEGATIVE
GLUCOSE UA: NEGATIVE
Ketones, UA: NEGATIVE
Leukocytes, UA: NEGATIVE
NITRITE UA: NEGATIVE
PROTEIN UA: NEGATIVE
UROBILINOGEN UA: 0.2 U/dL
pH, UA: 6 (ref 5.0–8.0)

## 2017-07-30 LAB — POCT URINE PREGNANCY: Preg Test, Ur: NEGATIVE

## 2017-07-30 MED ORDER — NAPROXEN 500 MG PO TABS: 500.0000 mg | ORAL_TABLET | Freq: Two times a day (BID) | ORAL | 0 refills | Status: DC

## 2017-07-30 NOTE — Addendum Note (Signed)
Addended by: Caffie Pinto on: 07/30/2017 04:32 PM   Modules accepted: Orders

## 2017-07-30 NOTE — Patient Instructions (Signed)

## 2017-07-30 NOTE — Progress Notes (Signed)
Patient ID: Jeanne [name], female   DOB: Jul 21, 1980, 37 y.o.   MRN: [id number]    Subjective:  I acted as a Education administrator for Dr. Carollee Herter.  Guerry Bruin, [place]   Patient ID: Jeanne [name], female    DOB: 1980-08-20, 37 y.o.   MRN: [id number]  Chief Complaint  Patient presents with  . Abdominal Cramping    Abdominal Cramping  This is a new problem. Episode onset: saturday. The pain is located in the generalized abdominal region. The quality of the pain is cramping and aching. The abdominal pain does not radiate. Associated symptoms include diarrhea and headaches. Pertinent negatives include no belching, constipation, dysuria, fever, flatus, frequency, hematochezia, hematuria, melena, nausea or vomiting. Relieved by: stretching. She has tried acetaminophen for the symptoms. The treatment provided mild relief.   pt also c/o bloating in abd.   Patient is in today for abdominal cramping.  Patient Care Team: Carollee Herter, Alferd Apa, DO as PCP - General (Family Medicine)   No past medical history on file.  Past Surgical History:  Procedure Laterality Date  . EYE SURGERY Bilateral 2006    Family History  Problem Relation Age of Onset  . Thyroid disease Mother   . Fibroids Mother   . Deep vein thrombosis Mother   . Cancer Maternal Grandmother        liver   . Cancer Maternal Grandfather        pancreatic  . Cancer Paternal Grandfather        prostate    Social History   Socioeconomic History  . Marital status: Married    Spouse name: Not on file  . Number of children: Not on file  . Years of education: Not on file  . Highest education level: Not on file  Social Needs  . Financial resource strain: Not on file  . Food insecurity - worry: Not on file  . Food insecurity - inability: Not on file  . Transportation needs - medical: Not on file  . Transportation needs - non-medical: Not on file  Occupational History    Employer: [employer]    Comment: 4 th grade  Tobacco  Use  . Smoking status: Never Smoker  . Smokeless tobacco: Never Used  Substance and Sexual Activity  . Alcohol use: No    Alcohol/week: 0.0 oz  . Drug use: No  . Sexual activity: Yes    Partners: Male  Other Topics Concern  . Not on file  Social History Narrative   Exercise--- walk in summer--- its been difficult during school year    Outpatient Medications Prior to Visit  Medication Sig Dispense Refill  . beclomethasone (QVAR) 40 MCG/ACT inhaler Inhale 2 puffs into the lungs 2 (two) times daily. 1 Inhaler 3  . metoprolol succinate (TOPROL-XL) 50 MG 24 hr tablet Take 1 tablet (50 mg total) by mouth daily. Take with or immediately following a meal. 90 tablet 3  . Prenatal Vit-Fe Fumarate-FA (PRENATAL VITAMIN PO) Take 1 tablet by mouth daily.    [name] PROAIR HFA 108 (90 Base) MCG/ACT inhaler INHALE 2 PUFFS INTO THE LUNGS EVERY 6 (SIX) HOURS AS NEEDED FOR WHEEZING OR SHORTNESS OF BREATH. 8.5 Inhaler 1  . sertraline (ZOLOFT) 50 MG tablet Take 100 mg by mouth daily.     [name] triamcinolone cream (KENALOG) 0.1 % Apply 1 application topically 2 (two) times daily. 15 g 3   No facility-administered medications prior to visit.     No Known Allergies  Review of  Systems  Constitutional: Negative for fever and malaise/fatigue.  HENT: Negative for congestion.   Eyes: Negative for blurred vision.  Respiratory: Negative for cough and shortness of breath.   Cardiovascular: Negative for chest pain, palpitations and leg swelling.  Gastrointestinal: Positive for abdominal pain and diarrhea. Negative for constipation, flatus, hematochezia, melena, nausea and vomiting.  Genitourinary: Negative for dysuria, frequency and hematuria.  Musculoskeletal: Positive for back pain.  Skin: Negative for rash.  Neurological: Positive for headaches. Negative for loss of consciousness.       Objective:    Physical Exam  Constitutional: She is oriented to person, place, and time. She appears well-developed and  well-nourished.  HENT:  Head: Normocephalic and atraumatic.  Eyes: Conjunctivae and EOM are normal.  Neck: Normal range of motion. Neck supple. No JVD present. Carotid bruit is not present. No thyromegaly present.  Cardiovascular: Normal rate, regular rhythm and normal heart sounds.  No murmur heard. Pulmonary/Chest: Effort normal and breath sounds normal. No respiratory distress. She has no wheezes. She has no rales. She exhibits no tenderness.  Abdominal: She exhibits no distension and no mass. There is tenderness in the suprapubic area. There is no rebound and no guarding.  Musculoskeletal: She exhibits no edema.  Neurological: She is alert and oriented to person, place, and time.  Psychiatric: She has a normal mood and affect.  Nursing note and vitals reviewed.   BP 126/88 (BP Location: Right Arm, Cuff Size: Large)   Pulse 95   Temp 99.5 F (37.5 C) (Oral)   Resp 16   Ht 5\' 2"  (1.575 m)   Wt (!) 301 lb 6.4 oz (136.7 kg)   LMP 07/27/2017   SpO2 97%   BMI 55.13 kg/m  Wt Readings from Last 3 Encounters:  07/30/17 (!) 301 lb 6.4 oz (136.7 kg)  07/03/17 293 lb 12.8 oz (133.3 kg)  12/15/16 280 lb (127 kg)   BP Readings from Last 3 Encounters:  07/30/17 126/88  07/03/17 116/88  12/15/16 (!) 130/99     Immunization History  Administered Date(s) Administered  . Influenza-Unspecified 02/27/2016, 02/04/2017    Health Maintenance  Topic Date Due  . HIV Screening  11/20/1997  . PAP SMEAR  03/31/2017  . TETANUS/TDAP  10/20/2024  . INFLUENZA VACCINE  Completed    Lab Results  Component Value Date   WBC 8.3 07/03/2017   HGB 14.6 07/03/2017   HCT 43.2 07/03/2017   PLT 354.0 07/03/2017   GLUCOSE 83 07/03/2017   CHOL 187 07/03/2017   TRIG 98.0 07/03/2017   HDL 45.30 07/03/2017   LDLCALC 122 (H) 07/03/2017   ALT 22 07/03/2017   AST 18 07/03/2017   NA 138 07/03/2017   K 4.0 07/03/2017   CL 105 07/03/2017   CREATININE 0.60 07/03/2017   BUN 11 07/03/2017   CO2 25  07/03/2017   TSH 2.28 08/17/2016    Lab Results  Component Value Date   TSH 2.28 08/17/2016   Lab Results  Component Value Date   WBC 8.3 07/03/2017   HGB 14.6 07/03/2017   HCT 43.2 07/03/2017   MCV 90.9 07/03/2017   PLT 354.0 07/03/2017   Lab Results  Component Value Date   NA 138 07/03/2017   K 4.0 07/03/2017   CO2 25 07/03/2017   GLUCOSE 83 07/03/2017   BUN 11 07/03/2017   CREATININE 0.60 07/03/2017   BILITOT 0.4 07/03/2017   ALKPHOS 72 07/03/2017   AST 18 07/03/2017   ALT 22 07/03/2017   PROT  7.4 07/03/2017   ALBUMIN 4.3 07/03/2017   CALCIUM 9.0 07/03/2017   GFR 121.19 07/03/2017   Lab Results  Component Value Date   CHOL 187 07/03/2017   Lab Results  Component Value Date   HDL 45.30 07/03/2017   Lab Results  Component Value Date   LDLCALC 122 (H) 07/03/2017   Lab Results  Component Value Date   TRIG 98.0 07/03/2017   Lab Results  Component Value Date   CHOLHDL 4 07/03/2017   No results found for: HGBA1C       Assessment & Plan:   Problem List Items Addressed This Visit    None    Visit Diagnoses    Lower abdominal pain    -  Primary   Relevant Orders   POCT urine pregnancy (Completed)   POCT Urinalysis Dipstick (Automated) (Completed)   US PELVIS (TRANSABDOMINAL ONLY)   US PELVIS (TRANSABDOMINAL ONLY)   CBC with Differential/Platelet   TSH   Comprehensive metabolic panel    if pain worsens go to ER Suspect pain are menstrual cramps--- pt concerned because pain is worse than usual   I am having [name] maintain her sertraline, PROAIR HFA, beclomethasone, Prenatal Vit-Fe Fumarate-FA (PRENATAL VITAMIN PO), triamcinolone cream, and metoprolol succinate.  No orders of the defined types were placed in this encounter.   CMA served as Education administrator during this visit. History, Physical and Plan performed by medical provider. Documentation and orders reviewed and attested to.  Ann Held, DO

## 2017-07-31 ENCOUNTER — Ambulatory Visit (HOSPITAL_BASED_OUTPATIENT_CLINIC_OR_DEPARTMENT_OTHER)
Admission: RE | Admit: 2017-07-31 | Discharge: 2017-07-31 | Disposition: A | Discharge status: Home / Self Care | Payer: BC Managed Care – PPO | Source: Ambulatory Visit | Attending: Family Medicine | Admitting: Family Medicine

## 2017-07-31 ENCOUNTER — Other Ambulatory Visit: Payer: Self-pay | Admitting: Family Medicine

## 2017-07-31 DIAGNOSIS — N852 Hypertrophy of uterus: Secondary | ICD-10-CM | POA: Insufficient documentation

## 2017-07-31 DIAGNOSIS — D259 Leiomyoma of uterus, unspecified: Secondary | ICD-10-CM | POA: Insufficient documentation

## 2017-07-31 DIAGNOSIS — N858 Other specified noninflammatory disorders of uterus: Secondary | ICD-10-CM | POA: Diagnosis not present

## 2017-07-31 DIAGNOSIS — R1030 Lower abdominal pain, unspecified: Secondary | ICD-10-CM | POA: Diagnosis not present

## 2017-07-31 DIAGNOSIS — D219 Benign neoplasm of connective and other soft tissue, unspecified: Secondary | ICD-10-CM

## 2017-07-31 LAB — COMPREHENSIVE METABOLIC PANEL
ALBUMIN: 3.9 g/dL (ref 3.5–5.2)
ALK PHOS: 79 U/L (ref 39–117)
ALT: 15 U/L (ref 0–35)
AST: 14 U/L (ref 0–37)
BUN: 16 mg/dL (ref 6–23)
CALCIUM: 9.5 mg/dL (ref 8.4–10.5)
CO2: 26 mEq/L (ref 19–32)
Chloride: 105 mEq/L (ref 96–112)
Creatinine, Ser: 0.66 mg/dL (ref 0.40–1.20)
GFR: 108.52 mL/min (ref >60.00)
Glucose, Bld: 110 mg/dL — ABNORMAL HIGH (ref 70–99)
POTASSIUM: 4.3 meq/L (ref 3.5–5.1)
SODIUM: 139 meq/L (ref 135–145)
TOTAL PROTEIN: 7 g/dL (ref 6.0–8.3)
Total Bilirubin: 0.2 mg/dL (ref 0.2–1.2)

## 2017-07-31 LAB — CBC WITH DIFFERENTIAL/PLATELET
BASOS ABS: 0.1 10*3/uL (ref 0.0–0.1)
Basophils Relative: 1.1 % (ref 0.0–3.0)
EOS PCT: 7.7 % — AB (ref 0.0–5.0)
Eosinophils Absolute: 0.7 10*3/uL (ref 0.0–0.7)
HEMATOCRIT: 42.2 % (ref 36.0–46.0)
HEMOGLOBIN: 14.1 g/dL (ref 12.0–15.0)
LYMPHS ABS: 2.4 10*3/uL (ref 0.7–4.0)
LYMPHS PCT: 27.2 % (ref 12.0–46.0)
MCHC: 33.4 g/dL (ref 30.0–36.0)
MCV: 91.8 fl (ref 78.0–100.0)
MONOS PCT: 5.5 % (ref 3.0–12.0)
Monocytes Absolute: 0.5 10*3/uL (ref 0.1–1.0)
NEUTROS PCT: 58.5 % (ref 43.0–77.0)
Neutro Abs: 5.1 10*3/uL (ref 1.4–7.7)
Platelets: 297 10*3/uL (ref 150.0–400.0)
RBC: 4.6 Mil/uL (ref 3.87–5.11)
RDW: 13.4 % (ref 11.5–15.5)
WBC: 8.8 10*3/uL (ref 4.0–10.5)

## 2017-07-31 LAB — TSH: TSH: 3.55 u[IU]/mL (ref 0.35–4.50)

## 2017-08-01 ENCOUNTER — Encounter: Payer: Self-pay | Admitting: Family Medicine

## 2017-08-01 ENCOUNTER — Other Ambulatory Visit: Payer: Self-pay | Admitting: *Deleted

## 2017-08-01 DIAGNOSIS — D219 Benign neoplasm of connective and other soft tissue, unspecified: Secondary | ICD-10-CM

## 2017-08-15 ENCOUNTER — Ambulatory Visit (INDEPENDENT_AMBULATORY_CARE_PROVIDER_SITE_OTHER): Payer: BC Managed Care – PPO | Admitting: Obstetrics & Gynecology

## 2017-08-15 ENCOUNTER — Encounter: Payer: Self-pay | Admitting: Obstetrics & Gynecology

## 2017-08-15 VITALS — Ht 62.0 in | Wt 303.0 lb

## 2017-08-15 DIAGNOSIS — N939 Abnormal uterine and vaginal bleeding, unspecified: Secondary | ICD-10-CM

## 2017-08-15 DIAGNOSIS — D219 Benign neoplasm of connective and other soft tissue, unspecified: Secondary | ICD-10-CM | POA: Diagnosis not present

## 2017-08-15 DIAGNOSIS — R102 Pelvic and perineal pain: Secondary | ICD-10-CM

## 2017-08-15 MED ORDER — NORETHIN ACE-ETH ESTRAD-FE 1-20 MG-MCG(24) PO TABS: 1.0000 | ORAL_TABLET | Freq: Every day | ORAL | 11 refills | Status: DC

## 2017-08-15 NOTE — Progress Notes (Signed)
Subjective:     Jeanne [name] is a 37 y.o. female here for a routine exam. She is a G0. Her cyles are 28-30. Pt bleeds around 4 days. The first 3 days are heavy then then last day is light. She soaks through a pad. She wears a pad and a tampon at the same time. She soaks through a pad in 2 hours. Pt denies bleeding between cycels. Pt has been married x 4 years. Pt has been sexually active for 1 year and a few months with no contraception . Pt was on OCPs for 8 years. Pt was prev mioarried was on contraception the whole time.   Current complaints: AUB for >1 year and pain for a few months.Pt c/o and bloating.  Pts morther had fibroids. She is s/p a h/o fibroids that were so big that they wre pressing on her veins. Both of her sisters have 'problems with their uteruss'  .   Pt reports that the full feeling in her abd is her worse sx.   That started the past few cycels.    Gynecologic History Patient's last menstrual period was 07/27/2017. Contraception: none Last Pap: 03/2016. Results were: normal   Obstetric History OB History  No data available    The following portions of the patient's history were reviewed and updated as appropriate: allergies, current medications, past family history, past medical history, past social history, past surgical history and problem list.  Review of [employer] of 6th drages   Objective:  Ht 5\' 2"  (1.575 m)   Wt (!) 303 lb (137.4 kg)   LMP 07/27/2017   BMI 55.42 kg/m   CONSTITUTIONAL: Well-developed, well-nourished female in no acute distress.  HENT:  Normocephalic, atraumatic EYES: Conjunctivae and EOM are normal. No scleral icterus.  NECK: Normal range of motion SKIN: Skin is warm and dry. No rash noted. Not diaphoretic.No pallor. [place]: Alert and oriented to person, place, and time. Normal coordination.  Abd: mass palpable to umbilicus; mobile  GU: large uterine mass consisted with >24 week sized fibroids.     07/31/2017 CLINICAL  DATA:  Abdominal pain with heavy menses  EXAM: TRANSABDOMINAL AND TRANSVAGINAL ULTRASOUND OF PELVIS  TECHNIQUE: Both transabdominal and transvaginal ultrasound examinations of the pelvis were performed. Transabdominal technique was performed for global imaging of the pelvis including uterus, ovaries, adnexal regions, and pelvic cul-de-sac. It was necessary to proceed with endovaginal exam following the transabdominal exam to visualize the endometrium and ovaries.  COMPARISON:  None  FINDINGS: Uterus  Measurements: 22.8 x 11.3 x 23 cm. Multiple large masses within the uterus. Right fundal fibroid measures 11.5 x 8.1 x 11.3 cm. Left fundal fibroid measures 11.9 x 7.6 x 10.7 cm. Left uterine wall mass measures 7 x 4.1 x 6.2 cm. Lower uterine segment anterior mass measures 5.9 x 5.5 x 6.9 cm. Right lower uterine segment mass measures 4.4 x 4.1 x 5.3 cm  Endometrium  Thickness: 9.4 mm.  Difficult visualization due to multiple masses  Right ovary  Measurements: 4.1 x 1.6 x 3.4 cm. Normal appearance/no adnexal mass.  Left ovary  Measurements: 3.4 x 1.3 x 2.9 cm. Normal appearance/no adnexal mass.  Other findings  No abnormal free fluid.  IMPRESSION: 1. Markedly enlarged uterus containing multiple hypoechoic solid masses, likely fibroids. 2. Poor visualization of the endometrium due to the presence of multiple fibroids. Endometrial thickness about 9.4 mm. If bleeding remains unresponsive to hormonal or medical therapy, sonohysterogram should be considered for focal lesion work-up. (Ref:  Radiological Reasoning: Algorithmic Workup of Abnormal Vaginal Bleeding with Endovaginal Sonography and Sonohysterography. AJR 2008; [id number]:[id number])   Assessment:   Symptomatic uterine fibroids   Plan:    I reviewed with pt her management options. Her option are limited to myomectomy if she plans to maintain her fertilety. Pt wants to discuss with her husband as they have  also considered adoption . I also discussed [place] and hysterectomy in the event that she wants all of her options but stressed that these are not viable options  if she desires to conceive.   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 She will f/u to discuss further.   F/u in 6 weeks or sooner prn . Pt encouraged to call if she wants ot get on the scheduled even before her f/u appt.    Total face-to-face time with patient was 30 min.  Greater than 50% was spent in counseling and coordination of care with the patient.   Saveah Bahar L. Harraway-Smith, M.D., Cherlynn June

## 2017-08-15 NOTE — Progress Notes (Signed)
Patient went off birth control in Jan 2018 desires pregnancy. [name] RNBSN

## 2017-08-15 NOTE — Patient Instructions (Signed)
Uterine Fibroids Uterine fibroids are tissue masses (tumors) that can develop in the womb (uterus). They are also called leiomyomas. This type of tumor is not cancerous (benign) and does not spread to other parts of the body outside of the pelvic area, which is between the hip bones. Occasionally, fibroids may develop in the fallopian tubes, in the cervix, or on the support structures (ligaments) that surround the uterus. You can have one or many fibroids. Fibroids can vary in size, weight, and where they grow in the uterus. Some can become quite large. Most fibroids do not require medical treatment. What are the causes? A fibroid can develop when a single uterine cell keeps growing (replicating). Most cells in the human body have a control mechanism that keeps them from replicating without control. What are the signs or symptoms? Symptoms may include:  Heavy bleeding during your period.  Bleeding or spotting between periods.  Pelvic pain and pressure.  Bladder problems, such as needing to urinate more often (urinary frequency) or urgently.  Inability to reproduce offspring (infertility).  Miscarriages.  How is this diagnosed? Uterine fibroids are diagnosed through a physical exam. Your health care provider may feel the lumpy tumors during a pelvic exam. Ultrasonography and an MRI may be done to determine the size, location, and number of fibroids. How is this treated? Treatment may include:  Watchful waiting. This involves getting the fibroid checked by your health care provider to see if it grows or shrinks. Follow your health care provider's recommendations for how often to have this checked.  Hormone medicines. These can be taken by mouth or given through an intrauterine device (IUD).  Surgery. ? Removing the fibroids (myomectomy) or the uterus (hysterectomy). ? Removing blood supply to the fibroids (uterine artery embolization).  If fibroids interfere with your fertility and you  want to become pregnant, your health care provider may recommend having the fibroids removed. Follow these instructions at home:  Keep all follow-up visits as directed by your health care provider. This is important.  Take over-the-counter and prescription medicines only as told by your health care provider. ? If you were prescribed a hormone treatment, take the hormone medicines exactly as directed.  Ask your health care provider about taking iron pills and increasing the amount of dark green, leafy vegetables in your diet. These actions can help to boost your blood iron levels, which may be affected by heavy menstrual bleeding.  Pay close attention to your period and tell your health care provider about any changes, such as: ? Increased blood flow that requires you to use more pads or tampons than usual per month. ? A change in the number of days that your period lasts per month. ? A change in symptoms that are associated with your period, such as abdominal cramping or back pain. Contact a health care provider if:  You have pelvic pain, back pain, or abdominal cramps that cannot be controlled with medicines.  You have an increase in bleeding between and during periods.  You soak tampons or pads in a half hour or less.  You feel lightheaded, extra tired, or weak. Get help right away if:  You faint.  You have a sudden increase in pelvic pain. This information is not intended to replace advice given to you by your health care provider. Make sure you discuss any questions you have with your health care provider. Document Released: 05/19/2000 Document Revised: 01/20/2016 Document Reviewed: 11/18/2013 Elsevier Interactive Patient Education  2018 [place]  Myomectomy is surgery to remove a noncancerous tumor (myoma) from the uterus. Myomas are tumors made up of fibrous tissue. They are often called fibroid tumors. Fibroid tumors can range from the size of a pea to the size  of a grapefruit. In a myomectomy, the fibroid tumor is removed without removing the uterus. Because these tumors are rarely cancerous, this surgery is usually done only if the tumor is growing or causing symptoms such as pain, pressure, bleeding, or pain with intercourse. LET [facility] CARE PROVIDER KNOW ABOUT:  Any allergies you have.  All medicines you are taking, including vitamins, herbs, eye drops, creams, and over-the-counter medicines.  Previous problems you or members of your family have had with the use of anesthetics.  Any blood disorders you have.  Previous surgeries you have had.  Medical conditions you have. RISKS AND COMPLICATIONS Generally, this is a safe procedure. However, as with any procedure, complications can occur. Possible complications include:  Excessive bleeding.  Infection.  Injury to nearby organs.  Blood clots in the legs, chest, or brain.  Scar tissue on other organs and in the pelvis. This may require another surgery to remove the scar tissue.  BEFORE THE PROCEDURE  Ask your health care provider about changing or stopping your regular medicines. Avoid taking aspirin or blood thinners as directed by your health care provider.  Do not  eat or drink anything after midnight on the night before surgery.  If you smoke, do not  smoke for 2 weeks before the surgery.  Do not  drink alcohol the day before the surgery.  Arrange for someone to drive you home after the procedure or after your hospital stay. Also arrange for someone to help you with activities during your recovery. PROCEDURE You will be given medicine to make you sleep through the procedure (general anesthetic). Any of the following methods may be used to perform a myomectomy:  Small monitors will be put on your body. They are used to check your heart, blood pressure, and oxygen level.  An IV access tube will be put into one of your veins. Medicine will be able to flow directly into your  body through this IV tube.  You might be given a medicine to help you relax (sedative).  You will be given a medicine to make you sleep (general anesthetic). A breathing tube will be placed into your lungs during the procedure.  A thin, flexible tube (catheter) will be inserted into your bladder to collect urine.  Any of the following methods may be used to perform a myomectomy: ? Hysteroscopic myomectomy-This method may be used when the fibroid tumor is inside the cavity of the uterus. A long, thin tube that is like a telescope (hysteroscope) is inserted inside the uterus. A saline solution is put into your uterus. This expands the uterus and allows the surgeon to see the fibroids. Tools are passed through the hysteroscope to remove the fibroid tumor in pieces. ? Laparoscopic myomectomy-A few small cuts (incisions) are made in the lower abdomen. A thin, lighted tube with a tiny camera on the end (laparoscope) is inserted through one of the incisions. This gives the surgeon a good view of the area. The fibroid tumor is removed through the other incisions. The incisions are then closed with stitches (sutures) or staples. ? Abdominal myomectomy-This method is used when the fibroid tumor cannot be removed with a hysteroscope or laparoscope. The surgery is performed through a larger surgical incision in the abdomen. The  fibroid tumor is removed through this incision. The incision is closed with sutures or staples.  What to expect after the procedure  If you had a laparoscopic or hysteroscopic myomectomy, you may be able to go home the same day, or you may need to stay in the hospital overnight.  If you had an abdominal myomectomy, you may need to stay in the hospital for a few days.  Your IV access tube and catheter will be removed in 1-2 days.  You may be given medicine for pain or to help you sleep.  You may be given an antibiotic medicine, if needed. This information is not intended to replace  advice given to you by your health care provider. Make sure you discuss any questions you have with your health care provider. Document Released: 03/19/2007 Document Revised: 10/28/2015 Document Reviewed: 01/01/2013 Elsevier Interactive Patient Education  2017 [employer].

## 2017-08-17 ENCOUNTER — Encounter: Payer: Self-pay | Admitting: Obstetrics & Gynecology

## 2017-09-06 ENCOUNTER — Encounter (INDEPENDENT_AMBULATORY_CARE_PROVIDER_SITE_OTHER): Payer: BC Managed Care – PPO

## 2017-09-10 ENCOUNTER — Ambulatory Visit (INDEPENDENT_AMBULATORY_CARE_PROVIDER_SITE_OTHER): Payer: BC Managed Care – PPO | Admitting: Family Medicine

## 2017-09-10 ENCOUNTER — Encounter (INDEPENDENT_AMBULATORY_CARE_PROVIDER_SITE_OTHER): Payer: Self-pay | Admitting: Family Medicine

## 2017-09-10 VITALS — BP 138/86 | HR 83 | Temp 98.6°F | Ht 62.0 in | Wt 297.0 lb

## 2017-09-10 DIAGNOSIS — R0602 Shortness of breath: Secondary | ICD-10-CM

## 2017-09-10 DIAGNOSIS — Z9189 Other specified personal risk factors, not elsewhere classified: Secondary | ICD-10-CM

## 2017-09-10 DIAGNOSIS — R5383 Other fatigue: Secondary | ICD-10-CM | POA: Diagnosis not present

## 2017-09-10 DIAGNOSIS — Z0289 Encounter for other administrative examinations: Secondary | ICD-10-CM

## 2017-09-10 DIAGNOSIS — Z1331 Encounter for screening for depression: Secondary | ICD-10-CM

## 2017-09-10 DIAGNOSIS — E559 Vitamin D deficiency, unspecified: Secondary | ICD-10-CM

## 2017-09-10 DIAGNOSIS — Z6841 Body Mass Index (BMI) 40.0 and over, adult: Secondary | ICD-10-CM | POA: Diagnosis not present

## 2017-09-10 NOTE — Progress Notes (Signed)
Office: [phone]  /  Fax: [phone]   Dear Dr. Carollee Herter,   Thank you for referring Jeanne [name] to our clinic. The following note includes my evaluation and treatment recommendations.  HPI:   Chief Complaint: OBESITY    Jeanne [name] has been referred by Jeanne Held, DO for consultation regarding her obesity and obesity related comorbidities.    Jeanne [name] (MR# [id number]) is a 37 y.o. female who presents on 09/10/2017 for obesity evaluation and treatment. Current BMI is Body mass index is 54.32 kg/m.Jeanne [name] Kitchen Jeanne [name] has been struggling with her weight for many years and has been unsuccessful in either losing weight, maintaining weight loss, or reaching her healthy weight goal.     Jeanne [name] attended our information session and states she is currently in the action stage of change and ready to dedicate time achieving and maintaining a healthier weight. Jeanne [name] is interested in becoming our patient and working on intensive lifestyle modifications including (but not limited to) diet, exercise and weight loss.    Jeanne [name] states her family eats meals together she thinks her family will eat healthier with  her she struggles with family and or coworkers weight loss sabotage she has been heavy most of  her life she started gaining weight around 3rd and 4th grade her heaviest weight ever was approximately 300 lbs. she has significant food cravings issues  she is frequently drinking liquids with calories she frequently makes poor food choices she has problems with excessive hunger  she frequently eats larger portions than normal  she has binge eating behaviors she struggles with emotional eating    Fatigue Jeanne [name] feels her energy is lower than it should be. This has worsened with weight gain and has not worsened recently. Jeanne [name] admits to daytime somnolence and admits to waking up still tired. Patient is at risk for obstructive sleep apnea. Jeanne [name] has a history of symptoms of  daytime fatigue and morning fatigue. Patient generally gets 6 hours of sleep per night, and states they generally have restless sleep. Snoring is not present. Apneic episodes are not present. Epworth Sleepiness Score is 11  EKG was ordered today and show low voltage in precordial leads. Echocardiogram from 2018 was within normal limits.  Dyspnea on exertion Jeanne [name] notes increasing shortness of breath with exercising and seems to be worsening over time with weight gain. She notes getting out of breath sooner with activity than she used to. This has not gotten worse recently. EKG was ordered today and show low voltage in precordial leads. Echocardiogram from 2018 was within normal limits. Jeanne [name] denies orthopnea.  Vitamin D deficiency Jeanne [name] has a previous diagnosis of vitamin D deficiency but she is not on Vit D supplement currently. She denies nausea, vomiting or muscle weakness.  At risk for osteopenia and osteoporosis Jeanne [name] is at higher risk of osteopenia and osteoporosis due to vitamin D deficiency.   Depression Screen Jeanne [name] Food and Mood (modified PHQ-9) score was  Depression screen PHQ 2/9 09/10/2017  Decreased Interest 3  Down, Depressed, Hopeless 2  PHQ - 2 Score 5  Altered sleeping 2  Tired, decreased energy 3  Change in appetite 3  Feeling bad or failure about yourself  3  Trouble concentrating 1  Moving slowly or fidgety/restless 1  Suicidal thoughts 0  PHQ-9 Score 18  Difficult doing work/chores Very difficult    ALLERGIES: No Known Allergies  MEDICATIONS: Current Outpatient Medications on File Prior to Visit  Medication Sig Dispense Refill  . acetaminophen (TYLENOL) 500 MG  tablet Take 500 mg by mouth every 6 (six) hours as needed.    . beclomethasone (QVAR) 40 MCG/ACT inhaler Inhale 2 puffs into the lungs 2 (two) times daily. 1 Inhaler 3  . calcium carbonate (TUMS - DOSED IN MG ELEMENTAL CALCIUM) 500 MG chewable tablet Chew 1 tablet by mouth daily.    .  diphenhydrAMINE HCl, Sleep, (ZZZQUIL PO) Take by mouth.    . metoprolol succinate (TOPROL-XL) 50 MG 24 hr tablet Take 1 tablet (50 mg total) by mouth daily. Take with or immediately following a meal. 90 tablet 3  . naproxen (NAPROSYN) 500 MG tablet Take 1 tablet (500 mg total) by mouth 2 (two) times daily with a meal. 30 tablet 0  . Norethindrone Acetate-Ethinyl Estrad-FE (LOESTRIN 24 FE) 1-20 MG-MCG(24) tablet Take 1 tablet by mouth daily. 1 Package 11  . Prenatal Vit-Fe Fumarate-FA (PRENATAL VITAMIN PO) Take 1 tablet by mouth daily.    Jeanne [name] Kitchen PROAIR HFA 108 (90 Base) MCG/ACT inhaler INHALE 2 PUFFS INTO THE LUNGS EVERY 6 (SIX) HOURS AS NEEDED FOR WHEEZING OR SHORTNESS OF BREATH. 8.5 Inhaler 1  . sertraline (ZOLOFT) 50 MG tablet Take 100 mg by mouth daily.      No current facility-administered medications on file prior to visit.     PAST MEDICAL HISTORY: Past Medical History:  Diagnosis Date  . Anxiety   . Asthma   . Dry eyes   . Dry skin   . Eczema   . Fatigue   . Fibroids   . Heartburn   . HTN (hypertension)   . Hyperlipidemia   . Palpitations   . Rash in adult   . Shortness of breath   . Stress   . Tachycardia   . Vitamin D deficiency   . Wheezing     PAST SURGICAL HISTORY: Past Surgical History:  Procedure Laterality Date  . EYE SURGERY Bilateral 2006    SOCIAL HISTORY: Social History   Tobacco Use  . Smoking status: Never Smoker  . Smokeless tobacco: Never Used  Substance Use Topics  . Alcohol use: No    Alcohol/week: 0.0 oz  . Drug use: No    FAMILY HISTORY: Family History  Problem Relation Age of Onset  . Thyroid disease Mother   . Fibroids Mother   . Deep vein thrombosis Mother   . Cancer Maternal Grandmother        liver   . Cancer Maternal Grandfather        pancreatic  . Hyperlipidemia Father   . Hypertension Father   . Sleep apnea Father   . Obesity Father   . Cancer Paternal Grandfather        prostate    ROS: Review of Systems    Constitutional: Positive for malaise/fatigue.  Respiratory: Positive for shortness of breath (on exertion) and wheezing.   Cardiovascular: Negative for orthopnea.  Gastrointestinal: Positive for heartburn. Negative for nausea and vomiting.  Musculoskeletal:       Negative for muscle weakness  Skin: Positive for itching and rash.       Dryness   Psychiatric/Behavioral: The patient has insomnia.     PHYSICAL EXAM: Blood pressure 138/86, pulse 83, temperature 98.6 F (37 C), temperature source Oral, height 5\' 2"  (1.575 m), weight 297 lb (134.7 kg), SpO2 96 %. Body mass index is 54.32 kg/m. Physical Exam  Constitutional: She is oriented to person, place, and time. She appears well-developed and well-nourished.  HENT:  Head: Normocephalic and atraumatic.  Nose: Nose  normal.  Eyes: EOM are normal. No scleral icterus.  Neck: Normal range of motion. Neck supple. No thyromegaly present.  Cardiovascular: Normal rate and regular rhythm.  Pulmonary/Chest: Effort normal. No respiratory distress.  Abdominal: Soft. There is no tenderness.  + obesity  Musculoskeletal: Normal range of motion.  Range of Motion normal in all 4 extremities  Neurological: She is alert and oriented to person, place, and time. Coordination normal.  Skin: Skin is warm and dry.  Psychiatric: She has a normal mood and affect. Her behavior is normal.  Vitals reviewed.   RECENT LABS AND TESTS: BMET    Component Value Date/Time   NA 139 07/30/2017 1631   K 4.3 07/30/2017 1631   CL 105 07/30/2017 1631   CO2 26 07/30/2017 1631   GLUCOSE 110 (H) 07/30/2017 1631   BUN 16 07/30/2017 1631   CREATININE 0.66 07/30/2017 1631   CALCIUM 9.5 07/30/2017 1631   No results found for: HGBA1C No results found for: INSULIN CBC    Component Value Date/Time   WBC 8.8 07/30/2017 1631   RBC 4.60 07/30/2017 1631   HGB 14.1 07/30/2017 1631   HCT 42.2 07/30/2017 1631   PLT 297.0 07/30/2017 1631   MCV 91.8 07/30/2017 1631    MCHC 33.4 07/30/2017 1631   RDW 13.4 07/30/2017 1631   LYMPHSABS 2.4 07/30/2017 1631   MONOABS 0.5 07/30/2017 1631   EOSABS 0.7 07/30/2017 1631   BASOSABS 0.1 07/30/2017 1631   Iron/TIBC/Ferritin/ %Sat No results found for: IRON, TIBC, FERRITIN, IRONPCTSAT Lipid Panel     Component Value Date/Time   CHOL 187 07/03/2017 1609   TRIG 98.0 07/03/2017 1609   HDL 45.30 07/03/2017 1609   CHOLHDL 4 07/03/2017 1609   VLDL 19.6 07/03/2017 1609   LDLCALC 122 (H) 07/03/2017 1609   Hepatic Function Panel     Component Value Date/Time   PROT 7.0 07/30/2017 1631   ALBUMIN 3.9 07/30/2017 1631   AST 14 07/30/2017 1631   ALT 15 07/30/2017 1631   ALKPHOS 79 07/30/2017 1631   BILITOT 0.2 07/30/2017 1631      Component Value Date/Time   TSH 3.55 07/30/2017 1631   TSH 2.28 08/17/2016 1707   TSH 2.62 03/04/2015 1559    ECG  shows NSR with a rate of 89 BPM INDIRECT CALORIMETER done today shows a VO2 of 363 and a REE of 2530.  Her calculated basal metabolic rate is 9528 thus her basal metabolic rate is better than expected.    ASSESSMENT AND PLAN: Other fatigue - Plan: EKG 12-Lead, Hemoglobin A1c, Insulin, random, Lipid panel, Vitamin B12, Folate, T3, T4, free, TSH  Shortness of breath on exertion  Vitamin D deficiency - Plan: Lipid panel, VITAMIN D 25 Hydroxy (Vit-D Deficiency, Fractures)  Depression screening  At risk for osteopenia  Class 3 severe obesity with serious comorbidity and body mass index (BMI) of 50.0 to 59.9 in adult, unspecified obesity type (HCC)  PLAN: Fatigue Jeanne [name] was informed that her fatigue may be related to obesity, depression or many other causes. Labs will be ordered, and in the meanwhile Jeanne [name] has agreed to work on diet, exercise and weight loss to help with fatigue. Proper sleep hygiene was discussed including the need for 7-8 hours of quality sleep each night. A sleep study was not ordered based on symptoms and Epworth score. We will order indirect  calorimetry.  Dyspnea on exertion Jeanne [name]'s shortness of breath appears to be obesity related and exercise induced. She has agreed to work  on weight loss and gradually increase exercise to treat her exercise induced shortness of breath. If Ramesha follows our instructions and loses weight without improvement of her shortness of breath, we will plan to refer to pulmonology. We will order labs and indirect calorimetry and will monitor this condition regularly. Jeanne [name] agrees to this plan.  Vitamin D Deficiency Jeanne [name] was informed that low vitamin D levels contributes to fatigue and are associated with obesity, breast, and colon cancer. We will check vitamin D level today and she will follow up for routine testing of vitamin D, at least 2-3 times per year. Jeanne [name] agrees to follow up with our clinic in 2 weeks.  At risk for osteopenia and osteoporosis Jeanne [name] is at risk for osteopenia and osteoporosis due to her vitamin D deficiency. She was encouraged to take her vitamin D and follow her higher calcium diet and increase strengthening exercise to help strengthen her bones and decrease her risk of osteopenia and osteoporosis.  Depression Screen Jeanne [name] had a strongly positive depression screening. Depression is commonly associated with obesity and often results in emotional eating behaviors. We will monitor this closely and work on CBT to help improve the non-hunger eating patterns. Referral to Psychology may be required if no improvement is seen as she continues in our clinic.  Obesity Jeanne [name] is currently in the action stage of change and her goal is to continue with weight loss efforts. I recommend Bena begin the structured treatment plan as follows:  She has agreed to follow the Category 4 plan Bella has been instructed to eventually work up to a goal of 150 minutes of combined cardio and strengthening exercise per week for weight loss and overall health benefits. We discussed the following  Behavioral Modification Strategies today: better snacking choices, planning for success, increasing lean protein intake, decreasing simple carbohydrates  and work on meal planning and easy cooking plans   She was informed of the importance of frequent follow up visits to maximize her success with intensive lifestyle modifications for her multiple health conditions. She was informed we would discuss her lab results at her next visit unless there is a critical issue that needs to be addressed sooner. Dionicia agreed to keep her next visit at the agreed upon time to discuss these results.    OBESITY BEHAVIORAL INTERVENTION VISIT  Today's visit was # 1 out of 22.  Starting weight: 297 lbs Starting date: 09/10/17 Today's weight : 297 lbs  Today's date: 09/10/2017 Total lbs lost to date: 0 (Patients must lose 7 lbs in the first 6 months to continue with counseling)   ASK: We discussed the diagnosis of obesity with [name] today and Edeline agreed to give [place] permission to discuss obesity behavioral modification therapy today.  ASSESS: Lokelani has the diagnosis of obesity and her BMI today is 54.31 Zarea is in the action stage of change   ADVISE: Dashauna was educated on the multiple health risks of obesity as well as the benefit of weight loss to improve her health. She was advised of the need for long term treatment and the importance of lifestyle modifications.  AGREE: Multiple dietary modification options and treatment options were discussed and  Helen agreed to the above obesity treatment plan.   I, Doreene Nest, am acting as transcriptionist for  Eber Jones, MD  I have reviewed the above documentation for accuracy and completeness, and I agree with the above. - Ilene Qua, MD

## 2017-09-11 ENCOUNTER — Encounter (INDEPENDENT_AMBULATORY_CARE_PROVIDER_SITE_OTHER): Payer: Self-pay | Admitting: Family Medicine

## 2017-09-11 LAB — VITAMIN B12: Vitamin B-12: 467 pg/mL (ref 232–1245)

## 2017-09-11 LAB — INSULIN, RANDOM: INSULIN: 12.3 u[IU]/mL (ref 2.6–24.9)

## 2017-09-11 LAB — HEMOGLOBIN A1C
ESTIMATED AVERAGE GLUCOSE: 123 mg/dL
Hgb A1c MFr Bld: 5.9 % — ABNORMAL HIGH (ref 4.8–5.6)

## 2017-09-11 LAB — LIPID PANEL
CHOL/HDL RATIO: 3.6 ratio (ref 0.0–4.4)
Cholesterol, Total: 181 mg/dL (ref 100–199)
HDL: 50 mg/dL (ref >39)
LDL CALC: 113 mg/dL — AB (ref 0–99)
TRIGLYCERIDES: 92 mg/dL (ref 0–149)
VLDL CHOLESTEROL CAL: 18 mg/dL (ref 5–40)

## 2017-09-11 LAB — T3: T3, Total: 120 ng/dL (ref 71–180)

## 2017-09-11 LAB — T4, FREE: Free T4: 1.07 ng/dL (ref 0.82–1.77)

## 2017-09-11 LAB — TSH: TSH: 3.34 u[IU]/mL (ref 0.450–4.500)

## 2017-09-11 LAB — FOLATE: Folate: 19.4 ng/mL (ref >3.0)

## 2017-09-11 LAB — VITAMIN D 25 HYDROXY (VIT D DEFICIENCY, FRACTURES): Vit D, 25-Hydroxy: 20.3 ng/mL — ABNORMAL LOW (ref 30.0–100.0)

## 2017-09-26 ENCOUNTER — Ambulatory Visit: Payer: Self-pay | Admitting: Obstetrics & Gynecology

## 2017-09-27 ENCOUNTER — Encounter (HOSPITAL_COMMUNITY): Payer: Self-pay

## 2017-09-27 ENCOUNTER — Ambulatory Visit: Payer: BC Managed Care – PPO | Admitting: Obstetrics & Gynecology

## 2017-09-27 ENCOUNTER — Encounter: Payer: Self-pay | Admitting: Obstetrics & Gynecology

## 2017-09-27 ENCOUNTER — Ambulatory Visit (INDEPENDENT_AMBULATORY_CARE_PROVIDER_SITE_OTHER): Payer: BC Managed Care – PPO | Admitting: Family Medicine

## 2017-09-27 VITALS — BP 125/82 | HR 72 | Ht 62.0 in | Wt 298.0 lb

## 2017-09-27 VITALS — BP 109/76 | HR 84 | Temp 97.5°F | Ht 62.0 in | Wt 292.0 lb

## 2017-09-27 DIAGNOSIS — D219 Benign neoplasm of connective and other soft tissue, unspecified: Secondary | ICD-10-CM | POA: Diagnosis not present

## 2017-09-27 DIAGNOSIS — Z9189 Other specified personal risk factors, not elsewhere classified: Secondary | ICD-10-CM

## 2017-09-27 DIAGNOSIS — N920 Excessive and frequent menstruation with regular cycle: Secondary | ICD-10-CM | POA: Diagnosis not present

## 2017-09-27 DIAGNOSIS — E559 Vitamin D deficiency, unspecified: Secondary | ICD-10-CM | POA: Diagnosis not present

## 2017-09-27 DIAGNOSIS — E66813 Obesity, class 3: Secondary | ICD-10-CM

## 2017-09-27 DIAGNOSIS — Z6841 Body Mass Index (BMI) 40.0 and over, adult: Secondary | ICD-10-CM

## 2017-09-27 DIAGNOSIS — R7303 Prediabetes: Secondary | ICD-10-CM | POA: Diagnosis not present

## 2017-09-27 MED ORDER — VITAMIN D (ERGOCALCIFEROL) 1.25 MG (50000 UNIT) PO CAPS: 50000.0000 [IU] | ORAL_CAPSULE | ORAL | 0 refills | Status: DC

## 2017-09-27 MED ORDER — METFORMIN HCL 500 MG PO TABS: 500.0000 mg | ORAL_TABLET | Freq: Every day | ORAL | 0 refills | Status: DC

## 2017-09-27 NOTE — Patient Instructions (Signed)
Myomectomy Myomectomy is surgery to remove a noncancerous tumor (myoma) from the uterus. Myomas are tumors made up of fibrous tissue. They are often called fibroid tumors. Fibroid tumors can range from the size of a pea to the size of a grapefruit. In a myomectomy, the fibroid tumor is removed without removing the uterus. Because these tumors are rarely cancerous, this surgery is usually done only if the tumor is growing or causing symptoms such as pain, pressure, bleeding, or pain with intercourse. LET YOUR HEALTH CARE PROVIDER KNOW ABOUT:  Any allergies you have.  All medicines you are taking, including vitamins, herbs, eye drops, creams, and over-the-counter medicines.  Previous problems you or members of your family have had with the use of anesthetics.  Any blood disorders you have.  Previous surgeries you have had.  Medical conditions you have. RISKS AND COMPLICATIONS Generally, this is a safe procedure. However, as with any procedure, complications can occur. Possible complications include:  Excessive bleeding.  Infection.  Injury to nearby organs.  Blood clots in the legs, chest, or brain.  Scar tissue on other organs and in the pelvis. This may require another surgery to remove the scar tissue.  BEFORE THE PROCEDURE  Ask your health care provider about changing or stopping your regular medicines. Avoid taking aspirin or blood thinners as directed by your health care provider.  Do not  eat or drink anything after midnight on the night before surgery.  If you smoke, do not  smoke for 2 weeks before the surgery.  Do not  drink alcohol the day before the surgery.  Arrange for someone to drive you home after the procedure or after your hospital stay. Also arrange for someone to help you with activities during your recovery. PROCEDURE You will be given medicine to make you sleep through the procedure (general anesthetic). Any of the following methods may be used to perform  a myomectomy:  Small monitors will be put on your body. They are used to check your heart, blood pressure, and oxygen level.  An IV access tube will be put into one of your veins. Medicine will be able to flow directly into your body through this IV tube.  You might be given a medicine to help you relax (sedative).  You will be given a medicine to make you sleep (general anesthetic). A breathing tube will be placed into your lungs during the procedure.  A thin, flexible tube (catheter) will be inserted into your bladder to collect urine.  Any of the following methods may be used to perform a myomectomy: ? Hysteroscopic myomectomy-This method may be used when the fibroid tumor is inside the cavity of the uterus. A long, thin tube that is like a telescope (hysteroscope) is inserted inside the uterus. A saline solution is put into your uterus. This expands the uterus and allows the surgeon to see the fibroids. Tools are passed through the hysteroscope to remove the fibroid tumor in pieces. ? Laparoscopic myomectomy-A few small cuts (incisions) are made in the lower abdomen. A thin, lighted tube with a tiny camera on the end (laparoscope) is inserted through one of the incisions. This gives the surgeon a good view of the area. The fibroid tumor is removed through the other incisions. The incisions are then closed with stitches (sutures) or staples. ? Abdominal myomectomy-This method is used when the fibroid tumor cannot be removed with a hysteroscope or laparoscope. The surgery is performed through a larger surgical incision in the abdomen. The   fibroid tumor is removed through this incision. The incision is closed with sutures or staples.  What to expect after the procedure  If you had a laparoscopic or hysteroscopic myomectomy, you may be able to go home the same day, or you may need to stay in the hospital overnight.  If you had an abdominal myomectomy, you may need to stay in the hospital for a  few days.  Your IV access tube and catheter will be removed in 1-2 days.  You may be given medicine for pain or to help you sleep.  You may be given an antibiotic medicine, if needed. This information is not intended to replace advice given to you by your health care provider. Make sure you discuss any questions you have with your health care provider. Document Released: 03/19/2007 Document Revised: 10/28/2015 Document Reviewed: 01/01/2013 Elsevier Interactive Patient Education  2017 [employer]. Myomectomy, Care After Refer to this sheet in the next few weeks. These instructions provide you with information on caring for yourself after your procedure. Your health care provider may also give you more specific instructions. Your treatment has been planned according to current medical practices, but problems sometimes occur. Call your health care provider if you have any problems or questions after your procedure. What can I expect after the procedure? After your procedure, it is typical to have the following:  Pain in your abdomen, especially at any incision sites. You will be given pain medicine to control the pain.  Tiredness. This is a normal part of the recovery process. Your energy level will return to normal over the next several weeks.  Constipation.  Vaginal bleeding. This is normal and should stop after 1-2 weeks.  Follow these instructions at home:  Only take over-the-counter or prescription medicines as directed by your health care provider. Avoid aspirin because it can cause bleeding.  Do not douche, use tampons, or have sexual intercourse until given permission by your health care provider.  Remove or change any bandages (dressings) as directed by your health care provider.  Take showers instead of baths as directed by your health care provider.  You will probably be able to go back to your normal routine after a few days. Do not do anything that requires extra effort  until your health care provider says it is okay. Do not lift anything heavier than 15 pounds (6.8 kg) until your health care provider approves.  Walk daily but take frequent rest breaks if you tire easily.  Continue to practice deep breathing and coughing. If it hurts to cough, try holding a pillow against your belly as you cough.  If you become constipated, you may: ? Use a mild laxative if your health care provider approves. ? Add more fruit and bran to your diet. ? Drink enough fluids to keep your urine clear or pale yellow.  Take your temperature twice a day and write it down.  Do not drink alcohol.  Do not drive until your health care provider approves.  Have someone help you at home for 1 week or until you can do your own household activities.  Follow up with your health care provider as directed. Contact a health care provider if:  You have a fever.  You have increasing abdominal pain that is not relieved with medicine.  You have nausea, vomiting, or diarrhea.  You have pain when you urinate, or you have blood in your urine.  You have a rash on your body.  You have pain  or redness where your IV access tube was inserted.  You have redness, swelling, or any kind of drainage from an incision. Get help right away if:  You have weakness or lightheadedness.  You have pain, swelling, or redness in your legs.  You have chest pain.  You faint.  You have shortness of breath.  You have heavy vaginal bleeding.  Your incision is opening up. This information is not intended to replace advice given to you by your health care provider. Make sure you discuss any questions you have with your health care provider. Document Released: 10/12/2010 Document Revised: 10/28/2015 Document Reviewed: 01/01/2013 Elsevier Interactive Patient Education  2017 [employer].

## 2017-09-27 NOTE — Progress Notes (Signed)
History:  37 y.o. G0P0000 here today for f/u of eval of uterine fibroids.  She is here with her husband to discuss the surgical management of fibroids. She has been on OCPs since her last visit and reports that her sx have improved. Although the couple is amenable to adoption the pt wants to proceed with a myomectomy to see if she can maintain her fertility and assist with management of her sx.       The following portions of the patient's history were reviewed and updated as appropriate: allergies, current medications, past family history, past medical history, past social history, past surgical history and problem list.  Review of Systems:  Pertinent items are noted in HPI.    Objective:  Physical Exam Blood pressure 125/82, pulse 72, height 5\' 2"  (1.575 m), weight 298 lb (135.2 kg), last menstrual period 09/13/2017.  CONSTITUTIONAL: Well-developed, well-nourished female in no acute distress.  HENT:  Normocephalic, atraumatic EYES: Conjunctivae and EOM are normal. No scleral icterus.  NECK: Normal range of motion SKIN: Skin is warm and dry. No rash noted. Not diaphoretic.No pallor. [place]: Alert and oriented to person, place, and time. Normal coordination.   Labs and Imaging 2/26/ CLINICAL DATA:  Abdominal pain with heavy menses  EXAM: TRANSABDOMINAL AND TRANSVAGINAL ULTRASOUND OF PELVIS  TECHNIQUE: Both transabdominal and transvaginal ultrasound examinations of the pelvis were performed. Transabdominal technique was performed for global imaging of the pelvis including uterus, ovaries, adnexal regions, and pelvic cul-de-sac. It was necessary to proceed with endovaginal exam following the transabdominal exam to visualize the endometrium and ovaries.  COMPARISON:  None  FINDINGS: Uterus  Measurements: 22.8 x 11.3 x 23 cm. Multiple large masses within the uterus. Right fundal fibroid measures 11.5 x 8.1 x 11.3 cm. Left fundal fibroid measures 11.9 x 7.6 x 10.7 cm.  Left uterine wall mass measures 7 x 4.1 x 6.2 cm. Lower uterine segment anterior mass measures 5.9 x 5.5 x 6.9 cm. Right lower uterine segment mass measures 4.4 x 4.1 x 5.3 cm  Endometrium  Thickness: 9.4 mm.  Difficult visualization due to multiple masses  Right ovary  Measurements: 4.1 x 1.6 x 3.4 cm. Normal appearance/no adnexal mass.  Left ovary  Measurements: 3.4 x 1.3 x 2.9 cm. Normal appearance/no adnexal mass.  Other findings  No abnormal free fluid.  IMPRESSION: 1. Markedly enlarged uterus containing multiple hypoechoic solid masses, likely fibroids. 2. Poor visualization of the endometrium due to the presence of multiple fibroids. Endometrial thickness about 9.4 mm. If bleeding remains unresponsive to hormonal or medical therapy, sonohysterogram should be considered for focal lesion work-up. (Ref: Radiological Reasoning: Algorithmic Workup of Abnormal Vaginal Bleeding with Endovaginal Sonography and Sonohysterography. AJR 2008; 756:E33-29)  A//ssessment & Plan:  Symptomatic uterine fibroids- pt desires to maintain her fertility.  I have reviewed with pt and her spouse the tx option for fibroids at size and number such as hers. This includes [place], myomectomy and hysterectomy.    I have explained that there is no guarantee that she will be able to conceive after a myomectomy.  Both she and her husband asked questions and expressed understanding after the discussion.   Patient desires surgical management with a abd myomectomy.  The risks of surgery were discussed in detail with the patient including but not limited to: bleeding which may require transfusion or reoperation; infection which may require prolonged hospitalization or re-hospitalization and antibiotic therapy; injury to bowel, bladder, ureters and major vessels or other surrounding organs; need for additional  procedures including laparotomy; thromboembolic phenomenon, incisional problems and other  postoperative or anesthesia complications.  Patient was told that the likelihood that her condition and symptoms will be treated effectively with this surgical management was very high; the postoperative expectations were also discussed in detail. The patient also understands the alternative treatment options which were discussed in full. All questions were answered.  She was told that she will be contacted by our surgical scheduler regarding the time and date of her surgery; routine preoperative instructions of having nothing to eat or drink after midnight on the day prior to surgery and also coming to the hospital 1 1/2 hours prior to her time of surgery were also emphasized.  She was told she may be called for a preoperative appointment about a week prior to surgery and will be given further preoperative instructions at that visit. Printed patient education handouts about the procedure were given to the patient to review at home.  Total face-to-face time with patient was 25 min.  Greater than 50% was spent in counseling and coordination of care with the patient.   Aleksander Edmiston L. Harraway-Smith, M.D., Cherlynn June

## 2017-09-27 NOTE — Progress Notes (Signed)
Pt states periods have improved since starting OCP- less cramping/discomfort and lighter bleeding. However, bleeding lasted 5 days instead of her normal 3. Pt is happy with OCP.

## 2017-10-01 NOTE — Progress Notes (Signed)
Office: [phone]  /  Fax: [phone]   HPI:   Chief Complaint: OBESITY Donalyn is here to discuss her progress with her obesity treatment plan. She is on the Category 4 plan and is following her eating plan approximately 50 % of the time. She states she is exercising 0 minutes 0 times per week. Natali followed the meal plan fairly strictly the first week, then she went on vacation and struggled to get food in. Her weight is 292 lb (132.5 kg) today and has had a weight loss of 5 pounds over a period of 2 weeks since her last visit. She has lost 5 lbs since starting treatment with [place].  Vitamin D deficiency Folasade has a diagnosis of vitamin D deficiency. She is not currently taking vit D and denies nausea, vomiting or muscle weakness.  Pre-Diabetes Lianna has a diagnosis of pre-diabetes based on her elevated Hgb A1c of 5.9 and fasting insulin of 12.3 and was informed this puts her at greater risk of developing diabetes. She is not taking metformin currently and continues to work on diet and exercise to decrease risk of diabetes. She denies nausea or hypoglycemia.  At risk for diabetes Karlisa is at higher than average risk for developing diabetes due to her obesity and pre-diabetes. She currently denies polyuria or polydipsia.  ALLERGIES: No Known Allergies  MEDICATIONS: Current Outpatient Medications on File Prior to Visit  Medication Sig Dispense Refill  . acetaminophen (TYLENOL) 500 MG tablet Take 500 mg by mouth every 6 (six) hours as needed.    . beclomethasone (QVAR) 40 MCG/ACT inhaler Inhale 2 puffs into the lungs 2 (two) times daily. 1 Inhaler 3  . calcium carbonate (TUMS - DOSED IN MG ELEMENTAL CALCIUM) 500 MG chewable tablet Chew 1 tablet by mouth daily.    . diphenhydrAMINE HCl, Sleep, (ZZZQUIL PO) Take by mouth.    . metoprolol succinate (TOPROL-XL) 50 MG 24 hr tablet Take 1 tablet (50 mg total) by mouth daily. Take with or immediately following a meal. 90 tablet 3  .  naproxen (NAPROSYN) 500 MG tablet Take 1 tablet (500 mg total) by mouth 2 (two) times daily with a meal. 30 tablet 0  . Norethindrone Acetate-Ethinyl Estrad-FE (LOESTRIN 24 FE) 1-20 MG-MCG(24) tablet Take 1 tablet by mouth daily. 1 Package 11  . PROAIR HFA 108 (90 Base) MCG/ACT inhaler INHALE 2 PUFFS INTO THE LUNGS EVERY 6 (SIX) HOURS AS NEEDED FOR WHEEZING OR SHORTNESS OF BREATH. 8.5 Inhaler 1  . sertraline (ZOLOFT) 50 MG tablet Take 100 mg by mouth daily.      No current facility-administered medications on file prior to visit.     PAST MEDICAL HISTORY: Past Medical History:  Diagnosis Date  . Anxiety   . Asthma   . Dry eyes   . Dry skin   . Eczema   . Fatigue   . Fibroids   . Heartburn   . HTN (hypertension)   . Hyperlipidemia   . Palpitations   . Rash in adult   . Shortness of breath   . Stress   . Tachycardia   . Vitamin D deficiency   . Wheezing     PAST SURGICAL HISTORY: Past Surgical History:  Procedure Laterality Date  . EYE SURGERY Bilateral 2006    SOCIAL HISTORY: Social History   Tobacco Use  . Smoking status: Never Smoker  . Smokeless tobacco: Never Used  Substance Use Topics  . Alcohol use: No    Alcohol/week: 0.0 oz  .  Drug use: No    FAMILY HISTORY: Family History  Problem Relation Age of Onset  . Thyroid disease Mother   . Fibroids Mother   . Deep vein thrombosis Mother   . Cancer Maternal Grandmother        liver   . Cancer Maternal Grandfather        pancreatic  . Hyperlipidemia Father   . Hypertension Father   . Sleep apnea Father   . Obesity Father   . Cancer Paternal Grandfather        prostate    ROS: Review of Systems  Constitutional: Positive for weight loss.  Gastrointestinal: Negative for nausea and vomiting.  Genitourinary: Negative for frequency.  Musculoskeletal:       Negative for muscle weakness  Endo/Heme/Allergies: Negative for polydipsia.       Negative for hypoglycemia    PHYSICAL EXAM: Blood pressure  109/76, pulse 84, temperature (!) 97.5 F (36.4 C), temperature source Oral, height 5\' 2"  (1.575 m), weight 292 lb (132.5 kg), last menstrual period 09/13/2017, SpO2 96 %. Body mass index is 53.41 kg/m. Physical Exam  Constitutional: She is oriented to person, place, and time. She appears well-developed and well-nourished.  Cardiovascular: Normal rate.  Pulmonary/Chest: Effort normal.  Musculoskeletal: Normal range of motion.  Neurological: She is oriented to person, place, and time.  Skin: Skin is warm and dry.  Psychiatric: She has a normal mood and affect. Her behavior is normal.  Vitals reviewed.   RECENT LABS AND TESTS: BMET    Component Value Date/Time   NA 139 07/30/2017 1631   K 4.3 07/30/2017 1631   CL 105 07/30/2017 1631   CO2 26 07/30/2017 1631   GLUCOSE 110 (H) 07/30/2017 1631   BUN 16 07/30/2017 1631   CREATININE 0.66 07/30/2017 1631   CALCIUM 9.5 07/30/2017 1631   Lab Results  Component Value Date   HGBA1C 5.9 (H) 09/10/2017   Lab Results  Component Value Date   INSULIN 12.3 09/10/2017   CBC    Component Value Date/Time   WBC 8.8 07/30/2017 1631   RBC 4.60 07/30/2017 1631   HGB 14.1 07/30/2017 1631   HCT 42.2 07/30/2017 1631   PLT 297.0 07/30/2017 1631   MCV 91.8 07/30/2017 1631   MCHC 33.4 07/30/2017 1631   RDW 13.4 07/30/2017 1631   LYMPHSABS 2.4 07/30/2017 1631   MONOABS 0.5 07/30/2017 1631   EOSABS 0.7 07/30/2017 1631   BASOSABS 0.1 07/30/2017 1631   Iron/TIBC/Ferritin/ %Sat No results found for: IRON, TIBC, FERRITIN, IRONPCTSAT Lipid Panel     Component Value Date/Time   CHOL 181 09/10/2017 1058   TRIG 92 09/10/2017 1058   HDL 50 09/10/2017 1058   CHOLHDL 3.6 09/10/2017 1058   CHOLHDL 4 07/03/2017 1609   VLDL 19.6 07/03/2017 1609   LDLCALC 113 (H) 09/10/2017 1058   Hepatic Function Panel     Component Value Date/Time   PROT 7.0 07/30/2017 1631   ALBUMIN 3.9 07/30/2017 1631   AST 14 07/30/2017 1631   ALT 15 07/30/2017 1631    ALKPHOS 79 07/30/2017 1631   BILITOT 0.2 07/30/2017 1631      Component Value Date/Time   TSH 3.340 09/10/2017 1058   TSH 3.55 07/30/2017 1631   TSH 2.28 08/17/2016 1707   Results for MYESHA, STILLION (MRN [id number]) as of 10/01/2017 15:26  Ref. Range 09/10/2017 10:58  Vitamin D, 25-Hydroxy Latest Ref Range: 30.0 - 100.0 ng/mL 20.3 (L)   ASSESSMENT AND PLAN: Vitamin D deficiency -  Plan: Vitamin D, Ergocalciferol, (DRISDOL) 50000 units CAPS capsule  Prediabetes - Plan: metFORMIN (GLUCOPHAGE) 500 MG tablet  At risk for diabetes mellitus  Class 3 severe obesity with serious comorbidity and body mass index (BMI) of 50.0 to 59.9 in adult, unspecified obesity type ([place])  PLAN:  Vitamin D Deficiency Sundeep was informed that low vitamin D levels contributes to fatigue and are associated with obesity, breast, and colon cancer. She agrees to start to take prescription Vit D @50 ,000 IU every week #4 with no refills and will follow up for routine testing of vitamin D, at least 2-3 times per year. She was informed of the risk of over-replacement of vitamin D and agrees to not increase her dose unless she discusses this with [place] first. Larue agrees to follow up with our clinic in 2 weeks.  Pre-Diabetes Tuana will continue to work on weight loss, exercise, and decreasing simple carbohydrates in her diet to help decrease the risk of diabetes. We dicussed metformin including benefits and risks. She was informed that eating too many simple carbohydrates or too many calories at one sitting increases the likelihood of GI side effects. Nekesha agreed to start metformin 500 mg qAM #30 with no refills and follow up with [place] as directed to monitor her progress.  Diabetes risk counseling Takara was given extended (30 minutes) diabetes prevention counseling today. She is 37 y.o. female and has risk factors for diabetes including obesity and pre-diabetes. We discussed intensive lifestyle modifications today with an  emphasis on weight loss as well as increasing exercise and decreasing simple carbohydrates in her diet.  Obesity Robecca is currently in the action stage of change. As such, her goal is to continue with weight loss efforts She has agreed to follow the Category 4 plan Lokelani has been instructed to work up to a goal of 150 minutes of combined cardio and strengthening exercise per week for weight loss and overall health benefits. We discussed the following Behavioral Modification Strategies today: planning for success, better snacking choices, increasing lean protein intake, decreasing simple carbohydrates and work on meal planning and easy cooking plans  Berry has agreed to follow up with our clinic in 2 weeks. She was informed of the importance of frequent follow up visits to maximize her success with intensive lifestyle modifications for her multiple health conditions.   OBESITY BEHAVIORAL INTERVENTION VISIT  Today's visit was # 2 out of 22.  Starting weight: 297 lbs Starting date: 09/10/17 Today's weight : 292 lbs Today's date: 09/27/2017 Total lbs lost to date: 5 (Patients must lose 7 lbs in the first 6 months to continue with counseling)   ASK: We discussed the diagnosis of obesity with [name] today and Arietta agreed to give [place] permission to discuss obesity behavioral modification therapy today.  ASSESS: Dawana has the diagnosis of obesity and her BMI today is 53.39 Abiha is in the action stage of change   ADVISE: Jentri was educated on the multiple health risks of obesity as well as the benefit of weight loss to improve her health. She was advised of the need for long term treatment and the importance of lifestyle modifications.  AGREE: Multiple dietary modification options and treatment options were discussed and  Itati agreed to the above obesity treatment plan.  I, Doreene Nest, am acting as transcriptionist for Eber Jones, MD  I have reviewed the  above documentation for accuracy and completeness, and I agree with the above. - Ilene Qua, MD

## 2017-10-02 ENCOUNTER — Encounter: Payer: Self-pay | Admitting: Obstetrics & Gynecology

## 2017-10-07 ENCOUNTER — Encounter (INDEPENDENT_AMBULATORY_CARE_PROVIDER_SITE_OTHER): Payer: Self-pay | Admitting: Family Medicine

## 2017-10-15 ENCOUNTER — Ambulatory Visit (INDEPENDENT_AMBULATORY_CARE_PROVIDER_SITE_OTHER): Payer: BC Managed Care – PPO | Admitting: Family Medicine

## 2017-10-15 VITALS — BP 138/92 | HR 86 | Temp 98.0°F | Ht 62.0 in | Wt 287.0 lb

## 2017-10-15 DIAGNOSIS — R7303 Prediabetes: Secondary | ICD-10-CM | POA: Diagnosis not present

## 2017-10-15 DIAGNOSIS — E559 Vitamin D deficiency, unspecified: Secondary | ICD-10-CM | POA: Diagnosis not present

## 2017-10-15 DIAGNOSIS — Z9189 Other specified personal risk factors, not elsewhere classified: Secondary | ICD-10-CM

## 2017-10-15 DIAGNOSIS — Z6841 Body Mass Index (BMI) 40.0 and over, adult: Secondary | ICD-10-CM

## 2017-10-15 DIAGNOSIS — E66813 Obesity, class 3: Secondary | ICD-10-CM

## 2017-10-15 MED ORDER — METFORMIN HCL 500 MG PO TABS: 500.0000 mg | ORAL_TABLET | Freq: Every day | ORAL | 0 refills | Status: DC

## 2017-10-15 MED ORDER — VITAMIN D (ERGOCALCIFEROL) 1.25 MG (50000 UNIT) PO CAPS: 50000.0000 [IU] | ORAL_CAPSULE | ORAL | 0 refills | Status: DC

## 2017-10-16 NOTE — Progress Notes (Signed)
Office: [phone]  /  Fax: [phone]   HPI:   Chief Complaint: OBESITY Jeanne [name] is here to discuss her progress with her obesity treatment plan. She is on the Category  plan and is following her eating plan approximately 65 % of the time. She states she is exercising 0 minutes 0 times per week. Jeanne [name] did well following the meal plan for the most part. She is planning to have surgery on May 28th for fibroids and is worried about getting all her food in. Her weight is 287 lb (130.2 kg) today and has had a weight loss of 5 pounds over a period of 2 weeks since her last visit. She has lost 10 lbs since starting treatment with [place].  Vitamin D deficiency Jeanne [name] has a diagnosis of vitamin D deficiency. She is currently taking vit D and denies nausea, vomiting or muscle weakness.  Pre-Diabetes Jeanne [name] has a diagnosis of prediabetes based on her elevated Hgb A1c and was informed this puts her at greater risk of developing diabetes. She is taking metformin currently and continues to work on diet and exercise to decrease risk of diabetes. She denies any hypoglycemic events.  At risk for diabetes Jeanne [name] is at higher than average risk for developing diabetes due to her obesity and pre-diabetes. She currently denies polyuria or polydipsia.  ALLERGIES: No Known Allergies  MEDICATIONS: Current Outpatient Medications on File Prior to Visit  Medication Sig Dispense Refill  . acetaminophen (TYLENOL) 500 MG tablet Take 1,000 mg by mouth every 6 (six) hours as needed (for pain/headaches.).     [name] beclomethasone (QVAR) 40 MCG/ACT inhaler Inhale 2 puffs into the lungs 2 (two) times daily. (Patient taking differently: Inhale 2 puffs into the lungs 2 (two) times daily as needed (for breathing difficulties due to seasonal allergies.). ) 1 Inhaler 3  . calcium carbonate (TUMS - DOSED IN MG ELEMENTAL CALCIUM) 500 MG chewable tablet Chew 1 tablet by mouth 3 (three) times daily as needed for indigestion or  heartburn.     . diphenhydrAMINE HCl, Sleep, (ZZZQUIL) 25 MG CAPS Take 25 mg by mouth at bedtime as needed (for sleep.).    [name] metoprolol succinate (TOPROL-XL) 50 MG 24 hr tablet Take 1 tablet (50 mg total) by mouth daily. Take with or immediately following a meal. 90 tablet 3  . naproxen (NAPROSYN) 500 MG tablet Take 1 tablet (500 mg total) by mouth 2 (two) times daily with a meal. 30 tablet 0  . Norethindrone Acetate-Ethinyl Estrad-FE (LOESTRIN 24 FE) 1-20 MG-MCG(24) tablet Take 1 tablet by mouth daily. (Patient taking differently: Take 1 tablet by mouth at bedtime. ) 1 Package 11  . Polyethyl Glycol-Propyl Glycol (LUBRICANT EYE DROPS) 0.4-0.3 % SOLN Place 1 drop into both eyes 3 (three) times daily as needed (for dry/allergy eyes.).    [name] PROAIR HFA 108 (90 Base) MCG/ACT inhaler INHALE 2 PUFFS INTO THE LUNGS EVERY 6 (SIX) HOURS AS NEEDED FOR WHEEZING OR SHORTNESS OF BREATH. 8.5 Inhaler 1  . sertraline (ZOLOFT) 100 MG tablet Take 100 mg by mouth daily.    [name] triamcinolone cream (KENALOG) 0.1 % Apply 1 application topically 2 (two) times daily as needed (for eczema on hands.).     No current facility-administered medications on file prior to visit.     PAST MEDICAL HISTORY: Past Medical History:  Diagnosis Date  . Anxiety   . Asthma   . Dry eyes   . Dry skin   . Eczema   . Fatigue   .  Fibroids   . Heartburn   . HTN (hypertension)   . Hyperlipidemia   . Palpitations   . Rash in adult   . Shortness of breath   . Stress   . Tachycardia   . Vitamin D deficiency   . Wheezing     PAST SURGICAL HISTORY: Past Surgical History:  Procedure Laterality Date  . EYE SURGERY Bilateral 2006    SOCIAL HISTORY: Social History   Tobacco Use  . Smoking status: Never Smoker  . Smokeless tobacco: Never Used  Substance Use Topics  . Alcohol use: No    Alcohol/week: 0.0 oz  . Drug use: No    FAMILY HISTORY: Family History  Problem Relation Age of Onset  . Thyroid disease Mother   .  Fibroids Mother   . Deep vein thrombosis Mother   . Cancer Maternal Grandmother        liver   . Cancer Maternal Grandfather        pancreatic  . Hyperlipidemia Father   . Hypertension Father   . Sleep apnea Father   . Obesity Father   . Cancer Paternal Grandfather        prostate    ROS: Review of Systems  Constitutional: Positive for weight loss.  Gastrointestinal: Negative for nausea and vomiting.  Genitourinary: Negative for frequency.  Musculoskeletal:       Negative for muscle weakness  Endo/Heme/Allergies: Negative for polydipsia.       Negative for hypoglycemia    PHYSICAL EXAM: Blood pressure (!) 138/92, pulse 86, temperature 98 F (36.7 C), temperature source Oral, height 5\' 2"  (1.575 m), weight 287 lb (130.2 kg), SpO2 96 %. Body mass index is 52.49 kg/m. Physical Exam  Constitutional: She is oriented to person, place, and time. She appears well-developed and well-nourished.  Cardiovascular: Normal rate.  Pulmonary/Chest: Effort normal.  Musculoskeletal: Normal range of motion.  Neurological: She is oriented to person, place, and time.  Skin: Skin is warm and dry.  Psychiatric: She has a normal mood and affect. Her behavior is normal.  Vitals reviewed.   RECENT LABS AND TESTS: BMET    Component Value Date/Time   NA 139 07/30/2017 1631   K 4.3 07/30/2017 1631   CL 105 07/30/2017 1631   CO2 26 07/30/2017 1631   GLUCOSE 110 (H) 07/30/2017 1631   BUN 16 07/30/2017 1631   CREATININE 0.66 07/30/2017 1631   CALCIUM 9.5 07/30/2017 1631   Lab Results  Component Value Date   HGBA1C 5.9 (H) 09/10/2017   Lab Results  Component Value Date   INSULIN 12.3 09/10/2017   CBC    Component Value Date/Time   WBC 8.8 07/30/2017 1631   RBC 4.60 07/30/2017 1631   HGB 14.1 07/30/2017 1631   HCT 42.2 07/30/2017 1631   PLT 297.0 07/30/2017 1631   MCV 91.8 07/30/2017 1631   MCHC 33.4 07/30/2017 1631   RDW 13.4 07/30/2017 1631   LYMPHSABS 2.4 07/30/2017 1631    MONOABS 0.5 07/30/2017 1631   EOSABS 0.7 07/30/2017 1631   BASOSABS 0.1 07/30/2017 1631   Iron/TIBC/Ferritin/ %Sat No results found for: IRON, TIBC, FERRITIN, IRONPCTSAT Lipid Panel     Component Value Date/Time   CHOL 181 09/10/2017 1058   TRIG 92 09/10/2017 1058   HDL 50 09/10/2017 1058   CHOLHDL 3.6 09/10/2017 1058   CHOLHDL 4 07/03/2017 1609   VLDL 19.6 07/03/2017 1609   LDLCALC 113 (H) 09/10/2017 1058   Hepatic Function Panel  Component Value Date/Time   PROT 7.0 07/30/2017 1631   ALBUMIN 3.9 07/30/2017 1631   AST 14 07/30/2017 1631   ALT 15 07/30/2017 1631   ALKPHOS 79 07/30/2017 1631   BILITOT 0.2 07/30/2017 1631      Component Value Date/Time   TSH 3.340 09/10/2017 1058   TSH 3.55 07/30/2017 1631   TSH 2.28 08/17/2016 1707   Results for TEALA, DAFFRON (MRN [id number]) as of 10/16/2017 15:45  Ref. Range 09/10/2017 10:58  Vitamin D, 25-Hydroxy Latest Ref Range: 30.0 - 100.0 ng/mL 20.3 (L)   ASSESSMENT AND PLAN: Vitamin D deficiency - Plan: Vitamin D, Ergocalciferol, (DRISDOL) 50000 units CAPS capsule  Prediabetes - Plan: metFORMIN (GLUCOPHAGE) 500 MG tablet  At risk for diabetes mellitus  Class 3 severe obesity with serious comorbidity and body mass index (BMI) of 50.0 to 59.9 in adult, unspecified obesity type ([place])  PLAN:  Vitamin D Deficiency Jeanne [name] was informed that low vitamin D levels contributes to fatigue and are associated with obesity, breast, and colon cancer. She agrees to continue to take prescription Vit D @50 ,000 IU every week #4 with no refills and will follow up for routine testing of vitamin D, at least 2-3 times per year. She was informed of the risk of over-replacement of vitamin D and agrees to not increase her dose unless she discusses this with [place] first. Jeanne [name] agrees to follow up with our clinic in 2 weeks.  Pre-Diabetes Jeanne [name] will continue to work on weight loss, exercise, and decreasing simple carbohydrates in her diet to help  decrease the risk of diabetes. We dicussed metformin including benefits and risks. She was informed that eating too many simple carbohydrates or too many calories at one sitting increases the likelihood of GI side effects. Jeanne [name] requested metformin for now and a prescription was written today for 1 month refills. Jeanne [name] agreed to follow up with [place] as directed to monitor her progress.  Diabetes risk counseling Jeanne [name] was given extended (15 minutes) diabetes prevention counseling today. She is 37 y.o. female and has risk factors for diabetes including obesity and pre-diabetes. We discussed intensive lifestyle modifications today with an emphasis on weight loss as well as increasing exercise and decreasing simple carbohydrates in her diet.  Obesity Jeanne [name] is currently in the action stage of change. As such, her goal is to continue with weight loss efforts She has agreed to keep a food journal with 1750 to 1900 calories and 100 grams of protein daily or follow the Category 3 plan Jeanne [name] has been instructed to work up to a goal of 150 minutes of combined cardio and strengthening exercise per week for weight loss and overall health benefits. We discussed the following Behavioral Modification Strategies today: better snacking choices, planning for success, keep a strict food journal, increasing lean protein intake and work on meal planning and easy cooking plans  Jeanne [name] has agreed to follow up with our clinic in 2 weeks. She was informed of the importance of frequent follow up visits to maximize her success with intensive lifestyle modifications for her multiple health conditions.   OBESITY BEHAVIORAL INTERVENTION VISIT  Today's visit was # 3 out of 22.  Starting weight: 297 lbs Starting date: 09/10/17 Today's weight : 287 lbs Today's date: 10/15/2017 Total lbs lost to date: 10 (Patients must lose 7 lbs in the first 6 months to continue with counseling)   ASK: We discussed the diagnosis of obesity  with [name] today and Tearra agreed to give [place] permission to  discuss obesity behavioral modification therapy today.  ASSESS: Margarie has the diagnosis of obesity and her BMI today is 52.48 Kadey is in the action stage of change   ADVISE: Brittne was educated on the multiple health risks of obesity as well as the benefit of weight loss to improve her health. She was advised of the need for long term treatment and the importance of lifestyle modifications.  AGREE: Multiple dietary modification options and treatment options were discussed and  Tekeshia agreed to the above obesity treatment plan.  I, Doreene Nest, am acting as transcriptionist for Eber Jones, MD  I have reviewed the above documentation for accuracy and completeness, and I agree with the above. - Ilene Qua, MD

## 2017-10-17 ENCOUNTER — Encounter (HOSPITAL_COMMUNITY)
Admission: RE | Admit: 2017-10-17 | Discharge: 2017-10-17 | Disposition: A | Discharge status: Home / Self Care | Payer: BC Managed Care – PPO | Source: Ambulatory Visit | Attending: Obstetrics & Gynecology | Admitting: Obstetrics & Gynecology

## 2017-10-17 ENCOUNTER — Other Ambulatory Visit: Payer: Self-pay

## 2017-10-17 ENCOUNTER — Encounter (HOSPITAL_COMMUNITY): Payer: Self-pay

## 2017-10-17 DIAGNOSIS — Z01812 Encounter for preprocedural laboratory examination: Secondary | ICD-10-CM | POA: Insufficient documentation

## 2017-10-17 HISTORY — DX: Prediabetes: R73.03

## 2017-10-17 HISTORY — DX: Personal history of other medical treatment: Z92.89

## 2017-10-17 LAB — BASIC METABOLIC PANEL
Anion gap: 10 (ref 5–15)
BUN: 18 mg/dL (ref 6–20)
CHLORIDE: 107 mmol/L (ref 101–111)
CO2: 21 mmol/L — ABNORMAL LOW (ref 22–32)
Calcium: 9.4 mg/dL (ref 8.9–10.3)
Creatinine, Ser: 0.51 mg/dL (ref 0.44–1.00)
GFR calc Af Amer: >60 mL/min (ref >60)
GFR calc non Af Amer: >60 mL/min (ref >60)
GLUCOSE: 93 mg/dL (ref 65–99)
Potassium: 4.1 mmol/L (ref 3.5–5.1)
SODIUM: 138 mmol/L (ref 135–145)

## 2017-10-17 LAB — CBC
HEMATOCRIT: 43.2 % (ref 36.0–46.0)
Hemoglobin: 14.5 g/dL (ref 12.0–15.0)
MCH: 30.9 pg (ref 26.0–34.0)
MCHC: 33.6 g/dL (ref 30.0–36.0)
MCV: 92.1 fL (ref 78.0–100.0)
Platelets: 326 10*3/uL (ref 150–400)
RBC: 4.69 MIL/uL (ref 3.87–5.11)
RDW: 13.6 % (ref 11.5–15.5)
WBC: 9 10*3/uL (ref 4.0–10.5)

## 2017-10-17 LAB — PREPARE RBC (CROSSMATCH)

## 2017-10-17 LAB — ABO/RH: ABO/RH(D): A POS

## 2017-10-17 NOTE — Patient Instructions (Addendum)
Your procedure is scheduled on: Tuesday Oct 30, 2017 at 1:45 pm  Enter through the Main Entrance of [facility] at: 12:15 pm  Pick up the phone at the desk and dial [phone].  Call this number if you have problems the morning of surgery: [phone].  Remember: Do NOT eat food: after Midnight on Monday May 27 Do NOT drink clear liquids after: 7:45 am day of surgery Take these medicines the morning of surgery with a SIP OF WATER: Metoprolol, Sertraline.  Q-Var inhaler, Pro air inhaler if needed   Hold metformin for 24 hours prior to surgery  STOP ALL VITAMINS, HERBAL MEDICATIONS, SUPPLEMENTS 1 WEEK PRIOR TO SURGERY  DO NOT SMOKE DAY OF SURGERY  Do NOT wear jewelry (body piercing), metal hair clips/bobby pins, make-up, or nail polish. Do NOT wear lotions, powders, or perfumes.  You may wear deoderant. Do NOT shave for 48 hours prior to surgery. Do NOT bring valuables to the hospital. Contacts, dentures, or bridgework may not be worn into surgery. Leave suitcase in car.  After surgery it may be brought to your room.    For patients admitted to the hospital, checkout time is 11:00 AM the day of discharge.

## 2017-10-22 ENCOUNTER — Encounter: Payer: Self-pay | Admitting: Obstetrics & Gynecology

## 2017-10-27 LAB — BPAM RBC
Blood Product Expiration Date: 201906052359
Blood Product Expiration Date: 201906052359
Blood Product Expiration Date: 201906072359
Blood Product Expiration Date: 201906082359
ISSUE DATE / TIME: 201905170810
ISSUE DATE / TIME: 201905201447
Unit Type and Rh: 5100
Unit Type and Rh: 5100
Unit Type and Rh: 6200
Unit Type and Rh: 6200

## 2017-10-27 LAB — TYPE AND SCREEN
ABO/RH(D): A POS
Antibody Screen: NEGATIVE
Unit division: 0
Unit division: 0
Unit division: 0
Unit division: 0

## 2017-10-30 ENCOUNTER — Inpatient Hospital Stay (HOSPITAL_COMMUNITY): Payer: BC Managed Care – PPO | Admitting: Anesthesiology

## 2017-10-30 ENCOUNTER — Encounter (HOSPITAL_COMMUNITY)
Admission: RE | Disposition: A | Discharge status: Home / Self Care | Payer: Self-pay | Source: Ambulatory Visit | Attending: Obstetrics & Gynecology

## 2017-10-30 ENCOUNTER — Other Ambulatory Visit: Payer: Self-pay

## 2017-10-30 ENCOUNTER — Inpatient Hospital Stay (HOSPITAL_COMMUNITY)
Admission: RE | Admit: 2017-10-30 | Discharge: 2017-10-31 | DRG: 742 | Disposition: A | Discharge status: Home / Self Care | Payer: BC Managed Care – PPO | Source: Ambulatory Visit | Attending: Obstetrics & Gynecology | Admitting: Obstetrics & Gynecology

## 2017-10-30 ENCOUNTER — Encounter (HOSPITAL_COMMUNITY): Payer: Self-pay | Admitting: *Deleted

## 2017-10-30 DIAGNOSIS — R000 Tachycardia, unspecified: Secondary | ICD-10-CM | POA: Diagnosis present

## 2017-10-30 DIAGNOSIS — R12 Heartburn: Secondary | ICD-10-CM | POA: Diagnosis present

## 2017-10-30 DIAGNOSIS — J45909 Unspecified asthma, uncomplicated: Secondary | ICD-10-CM | POA: Diagnosis present

## 2017-10-30 DIAGNOSIS — I10 Essential (primary) hypertension: Secondary | ICD-10-CM | POA: Diagnosis present

## 2017-10-30 DIAGNOSIS — D219 Benign neoplasm of connective and other soft tissue, unspecified: Secondary | ICD-10-CM

## 2017-10-30 DIAGNOSIS — D259 Leiomyoma of uterus, unspecified: Secondary | ICD-10-CM | POA: Diagnosis present

## 2017-10-30 DIAGNOSIS — D250 Submucous leiomyoma of uterus: Secondary | ICD-10-CM | POA: Diagnosis not present

## 2017-10-30 DIAGNOSIS — Z9889 Other specified postprocedural states: Secondary | ICD-10-CM

## 2017-10-30 DIAGNOSIS — Z6841 Body Mass Index (BMI) 40.0 and over, adult: Secondary | ICD-10-CM | POA: Diagnosis not present

## 2017-10-30 DIAGNOSIS — Z23 Encounter for immunization: Secondary | ICD-10-CM | POA: Diagnosis not present

## 2017-10-30 DIAGNOSIS — R7303 Prediabetes: Secondary | ICD-10-CM | POA: Diagnosis present

## 2017-10-30 HISTORY — PX: MYOMECTOMY: SHX85

## 2017-10-30 LAB — CREATININE, SERUM
CREATININE: 0.73 mg/dL (ref 0.44–1.00)
GFR calc Af Amer: >60 mL/min (ref >60)
GFR calc non Af Amer: >60 mL/min (ref >60)

## 2017-10-30 LAB — CBC
HCT: 43.2 % (ref 36.0–46.0)
Hemoglobin: 13.9 g/dL (ref 12.0–15.0)
MCH: 30.5 pg (ref 26.0–34.0)
MCHC: 32.2 g/dL (ref 30.0–36.0)
MCV: 94.9 fL (ref 78.0–100.0)
PLATELETS: 252 10*3/uL (ref 150–400)
RBC: 4.55 MIL/uL (ref 3.87–5.11)
RDW: 13.7 % (ref 11.5–15.5)
WBC: 13.7 10*3/uL — ABNORMAL HIGH (ref 4.0–10.5)

## 2017-10-30 LAB — PREGNANCY, URINE: Preg Test, Ur: NEGATIVE

## 2017-10-30 SURGERY — MYOMECTOMY, ABDOMINAL APPROACH
Anesthesia: General | Site: Abdomen

## 2017-10-30 MED ORDER — BISACODYL 10 MG RE SUPP: 10.0000 mg | Freq: Every day | RECTAL | Status: DC | PRN

## 2017-10-30 MED ORDER — NALOXONE HCL 0.4 MG/ML IJ SOLN: 0.4000 mg | INTRAMUSCULAR | Status: DC | PRN

## 2017-10-30 MED ORDER — LIDOCAINE HCL (CARDIAC) PF 100 MG/5ML IV SOSY
PREFILLED_SYRINGE | INTRAVENOUS | Status: AC
  Filled 2017-10-30: qty 5

## 2017-10-30 MED ORDER — ACETAMINOPHEN 160 MG/5ML PO SOLN: 960.0000 mg | Freq: Once | ORAL | Status: DC

## 2017-10-30 MED ORDER — FENTANYL CITRATE (PF) 250 MCG/5ML IJ SOLN
INTRAMUSCULAR | Status: AC
  Filled 2017-10-30: qty 5

## 2017-10-30 MED ORDER — PROPOFOL 10 MG/ML IV BOLUS
INTRAVENOUS | Status: DC | PRN
  Administered 2017-10-30: 200 mg via INTRAVENOUS

## 2017-10-30 MED ORDER — CELECOXIB 200 MG PO CAPS: 200.0000 mg | ORAL_CAPSULE | Freq: Once | ORAL | Status: DC | PRN

## 2017-10-30 MED ORDER — ONDANSETRON HCL 4 MG/2ML IJ SOLN: 4.0000 mg | Freq: Four times a day (QID) | INTRAMUSCULAR | Status: DC | PRN

## 2017-10-30 MED ORDER — FENTANYL CITRATE (PF) 250 MCG/5ML IJ SOLN
INTRAMUSCULAR | Status: AC
Start: 2017-10-30 — End: ?
  Filled 2017-10-30: qty 5

## 2017-10-30 MED ORDER — ALBUTEROL SULFATE (2.5 MG/3ML) 0.083% IN NEBU: 3.0000 mL | INHALATION_SOLUTION | Freq: Four times a day (QID) | RESPIRATORY_TRACT | Status: DC | PRN

## 2017-10-30 MED ORDER — VASOPRESSIN 20 UNIT/ML IV SOLN
INTRAVENOUS | Status: AC
  Filled 2017-10-30: qty 1

## 2017-10-30 MED ORDER — DOCUSATE SODIUM 100 MG PO CAPS
100.0000 mg | ORAL_CAPSULE | Freq: Two times a day (BID) | ORAL | Status: DC
  Administered 2017-10-30 – 2017-10-31 (×2): 100 mg via ORAL
  Filled 2017-10-30 (×2): qty 1

## 2017-10-30 MED ORDER — CELECOXIB 200 MG PO CAPS
ORAL_CAPSULE | ORAL | Status: AC
  Administered 2017-10-30: 200 mg
  Filled 2017-10-30: qty 1

## 2017-10-30 MED ORDER — OXYCODONE-ACETAMINOPHEN 5-325 MG PO TABS
1.0000 | ORAL_TABLET | ORAL | Status: DC | PRN
  Administered 2017-10-31 (×2): 1 via ORAL
  Filled 2017-10-30 (×2): qty 1

## 2017-10-30 MED ORDER — OXYCODONE HCL 5 MG/5ML PO SOLN: 5.0000 mg | Freq: Once | ORAL | Status: DC | PRN

## 2017-10-30 MED ORDER — FAMOTIDINE 20 MG PO TABS: 20.0000 mg | ORAL_TABLET | Freq: Once | ORAL | Status: DC

## 2017-10-30 MED ORDER — METOPROLOL SUCCINATE ER 50 MG PO TB24
50.0000 mg | ORAL_TABLET | Freq: Every day | ORAL | Status: DC
  Administered 2017-10-31: 50 mg via ORAL
  Filled 2017-10-30 (×2): qty 1

## 2017-10-30 MED ORDER — METFORMIN HCL 500 MG PO TABS
500.0000 mg | ORAL_TABLET | Freq: Every day | ORAL | Status: DC
  Administered 2017-10-31: 500 mg via ORAL
  Filled 2017-10-30 (×2): qty 1

## 2017-10-30 MED ORDER — ACETAMINOPHEN 500 MG PO TABS
ORAL_TABLET | ORAL | Status: AC
  Administered 2017-10-30: 1000 mg
  Filled 2017-10-30: qty 2

## 2017-10-30 MED ORDER — ZOLPIDEM TARTRATE 5 MG PO TABS: 5.0000 mg | ORAL_TABLET | Freq: Every evening | ORAL | Status: DC | PRN

## 2017-10-30 MED ORDER — DIPHENHYDRAMINE HCL 12.5 MG/5ML PO ELIX: 12.5000 mg | ORAL_SOLUTION | Freq: Four times a day (QID) | ORAL | Status: DC | PRN

## 2017-10-30 MED ORDER — LACTATED RINGERS IV SOLN
INTRAVENOUS | Status: DC
  Administered 2017-10-30 (×3): via INTRAVENOUS

## 2017-10-30 MED ORDER — HYDROMORPHONE HCL 1 MG/ML IJ SOLN
INTRAMUSCULAR | Status: DC | PRN
  Administered 2017-10-30: 2 mg via INTRAVENOUS

## 2017-10-30 MED ORDER — ONDANSETRON HCL 4 MG/2ML IJ SOLN
INTRAMUSCULAR | Status: AC
  Filled 2017-10-30: qty 4

## 2017-10-30 MED ORDER — SUGAMMADEX SODIUM 200 MG/2ML IV SOLN
INTRAVENOUS | Status: DC | PRN
  Administered 2017-10-30: 500 mg via INTRAVENOUS

## 2017-10-30 MED ORDER — DIPHENHYDRAMINE HCL 50 MG/ML IJ SOLN: 12.5000 mg | Freq: Four times a day (QID) | INTRAMUSCULAR | Status: DC | PRN

## 2017-10-30 MED ORDER — PANTOPRAZOLE SODIUM 40 MG PO TBEC
40.0000 mg | DELAYED_RELEASE_TABLET | Freq: Every day | ORAL | Status: DC
  Administered 2017-10-31: 40 mg via ORAL
  Filled 2017-10-30: qty 1

## 2017-10-30 MED ORDER — HYDROMORPHONE HCL 1 MG/ML IJ SOLN
INTRAMUSCULAR | Status: AC
  Filled 2017-10-30: qty 1

## 2017-10-30 MED ORDER — FAMOTIDINE 20 MG PO TABS
ORAL_TABLET | ORAL | Status: AC
  Administered 2017-10-30: 20 mg
  Filled 2017-10-30: qty 1

## 2017-10-30 MED ORDER — SCOPOLAMINE 1 MG/3DAYS TD PT72
1.0000 | MEDICATED_PATCH | Freq: Once | TRANSDERMAL | Status: DC
  Administered 2017-10-30: 1.5 mg via TRANSDERMAL

## 2017-10-30 MED ORDER — GLYCOPYRROLATE 0.2 MG/ML IJ SOLN
INTRAMUSCULAR | Status: DC | PRN
  Administered 2017-10-30: 0.2 mg via INTRAVENOUS

## 2017-10-30 MED ORDER — STERILE WATER FOR IRRIGATION IR SOLN
Freq: Once | Status: DC
  Filled 2017-10-30: qty 5

## 2017-10-30 MED ORDER — PROPOFOL 10 MG/ML IV BOLUS
INTRAVENOUS | Status: AC
  Filled 2017-10-30: qty 20

## 2017-10-30 MED ORDER — ONDANSETRON HCL 4 MG PO TABS: 4.0000 mg | ORAL_TABLET | Freq: Four times a day (QID) | ORAL | Status: DC | PRN

## 2017-10-30 MED ORDER — ROCURONIUM BROMIDE 100 MG/10ML IV SOLN
INTRAVENOUS | Status: AC
  Filled 2017-10-30: qty 1

## 2017-10-30 MED ORDER — PROMETHAZINE HCL 25 MG/ML IJ SOLN: 6.2500 mg | INTRAMUSCULAR | Status: DC | PRN

## 2017-10-30 MED ORDER — HYDROMORPHONE HCL 1 MG/ML IJ SOLN: 0.2500 mg | INTRAMUSCULAR | Status: DC | PRN

## 2017-10-30 MED ORDER — SCOPOLAMINE 1 MG/3DAYS TD PT72
MEDICATED_PATCH | TRANSDERMAL | Status: AC
  Filled 2017-10-30: qty 1

## 2017-10-30 MED ORDER — BUPIVACAINE HCL (PF) 0.5 % IJ SOLN
INTRAMUSCULAR | Status: AC
  Filled 2017-10-30: qty 30

## 2017-10-30 MED ORDER — MENTHOL 3 MG MT LOZG: 1.0000 | LOZENGE | OROMUCOSAL | Status: DC | PRN

## 2017-10-30 MED ORDER — BUPIVACAINE HCL (PF) 0.5 % IJ SOLN
INTRAMUSCULAR | Status: DC | PRN
  Administered 2017-10-30: 30 mL

## 2017-10-30 MED ORDER — IBUPROFEN 600 MG PO TABS
600.0000 mg | ORAL_TABLET | Freq: Four times a day (QID) | ORAL | Status: DC | PRN
  Administered 2017-10-31 (×2): 600 mg via ORAL
  Filled 2017-10-30 (×2): qty 1

## 2017-10-30 MED ORDER — DEXTROSE 5 % IV SOLN
3.0000 g | INTRAVENOUS | Status: AC
  Administered 2017-10-30: 3 g via INTRAVENOUS
  Filled 2017-10-30: qty 3000

## 2017-10-30 MED ORDER — DEXTROSE-NACL 5-0.45 % IV SOLN
INTRAVENOUS | Status: DC
  Administered 2017-10-30 – 2017-10-31 (×2): via INTRAVENOUS

## 2017-10-30 MED ORDER — POLYETHYLENE GLYCOL 3350 17 G PO PACK
17.0000 g | PACK | Freq: Every day | ORAL | Status: DC | PRN
  Administered 2017-10-31: 17 g via ORAL
  Filled 2017-10-30: qty 1

## 2017-10-30 MED ORDER — GLYCOPYRROLATE 0.2 MG/ML IJ SOLN
INTRAMUSCULAR | Status: AC
  Filled 2017-10-30: qty 1

## 2017-10-30 MED ORDER — MIDAZOLAM HCL 2 MG/2ML IJ SOLN
INTRAMUSCULAR | Status: AC
  Filled 2017-10-30: qty 2

## 2017-10-30 MED ORDER — SERTRALINE HCL 100 MG PO TABS
100.0000 mg | ORAL_TABLET | Freq: Every day | ORAL | Status: DC
  Administered 2017-10-31: 100 mg via ORAL
  Filled 2017-10-30 (×2): qty 1

## 2017-10-30 MED ORDER — BUDESONIDE 0.25 MG/2ML IN SUSP
0.2500 mg | Freq: Two times a day (BID) | RESPIRATORY_TRACT | Status: DC
  Filled 2017-10-30 (×4): qty 2

## 2017-10-30 MED ORDER — ACETAMINOPHEN 500 MG PO TABS: 1000.0000 mg | ORAL_TABLET | Freq: Once | ORAL | Status: DC

## 2017-10-30 MED ORDER — FENTANYL CITRATE (PF) 100 MCG/2ML IJ SOLN
INTRAMUSCULAR | Status: DC | PRN
  Administered 2017-10-30: 150 ug via INTRAVENOUS
  Administered 2017-10-30 (×2): 100 ug via INTRAVENOUS

## 2017-10-30 MED ORDER — MIDAZOLAM HCL 2 MG/2ML IJ SOLN
INTRAMUSCULAR | Status: DC | PRN
  Administered 2017-10-30: 2 mg via INTRAVENOUS

## 2017-10-30 MED ORDER — SUGAMMADEX SODIUM 500 MG/5ML IV SOLN
INTRAVENOUS | Status: AC
  Filled 2017-10-30: qty 5

## 2017-10-30 MED ORDER — VASOPRESSIN 20 UNIT/ML IV SOLN
INTRAVENOUS | Status: DC | PRN
  Administered 2017-10-30: 15 mL via INTRAMUSCULAR
  Administered 2017-10-30: 20 mL via INTRAMUSCULAR

## 2017-10-30 MED ORDER — SODIUM CHLORIDE 0.9 % IJ SOLN
INTRAMUSCULAR | Status: AC
  Filled 2017-10-30: qty 100

## 2017-10-30 MED ORDER — DEXAMETHASONE SODIUM PHOSPHATE 10 MG/ML IJ SOLN
INTRAMUSCULAR | Status: AC
  Filled 2017-10-30: qty 1

## 2017-10-30 MED ORDER — LABETALOL HCL 5 MG/ML IV SOLN
INTRAVENOUS | Status: AC
  Filled 2017-10-30: qty 4

## 2017-10-30 MED ORDER — ONDANSETRON HCL 4 MG/2ML IJ SOLN
INTRAMUSCULAR | Status: DC | PRN
  Administered 2017-10-30 (×2): 4 mg via INTRAVENOUS

## 2017-10-30 MED ORDER — SIMETHICONE 80 MG PO CHEW
80.0000 mg | CHEWABLE_TABLET | Freq: Four times a day (QID) | ORAL | Status: DC | PRN
  Administered 2017-10-31 (×2): 80 mg via ORAL
  Filled 2017-10-30 (×2): qty 1

## 2017-10-30 MED ORDER — SODIUM CHLORIDE 0.9% FLUSH: 9.0000 mL | INTRAVENOUS | Status: DC | PRN

## 2017-10-30 MED ORDER — METHYLENE BLUE 0.5 % INJ SOLN
INTRAVENOUS | Status: AC
  Filled 2017-10-30: qty 10

## 2017-10-30 MED ORDER — NEOSTIGMINE METHYLSULFATE 10 MG/10ML IV SOLN
INTRAVENOUS | Status: AC
  Filled 2017-10-30: qty 1

## 2017-10-30 MED ORDER — KETOROLAC TROMETHAMINE 30 MG/ML IJ SOLN
INTRAMUSCULAR | Status: AC
  Filled 2017-10-30: qty 1

## 2017-10-30 MED ORDER — DEXAMETHASONE SODIUM PHOSPHATE 10 MG/ML IJ SOLN
INTRAMUSCULAR | Status: DC | PRN
  Administered 2017-10-30: 10 mg via INTRAVENOUS

## 2017-10-30 MED ORDER — ENOXAPARIN SODIUM 80 MG/0.8ML ~~LOC~~ SOLN
70.0000 mg | SUBCUTANEOUS | Status: DC
  Administered 2017-10-31: 70 mg via SUBCUTANEOUS
  Filled 2017-10-30 (×2): qty 0.8

## 2017-10-30 MED ORDER — PNEUMOCOCCAL VAC POLYVALENT 25 MCG/0.5ML IJ INJ
0.5000 mL | INJECTION | INTRAMUSCULAR | Status: AC
  Administered 2017-10-31: 0.5 mL via INTRAMUSCULAR
  Filled 2017-10-30: qty 0.5

## 2017-10-30 MED ORDER — ROCURONIUM BROMIDE 100 MG/10ML IV SOLN
INTRAVENOUS | Status: DC | PRN
  Administered 2017-10-30 (×2): 20 mg via INTRAVENOUS
  Administered 2017-10-30: 50 mg via INTRAVENOUS

## 2017-10-30 MED ORDER — OXYCODONE HCL 5 MG PO TABS: 5.0000 mg | ORAL_TABLET | Freq: Once | ORAL | Status: DC | PRN

## 2017-10-30 MED ORDER — HYDROMORPHONE 1 MG/ML IV SOLN
INTRAVENOUS | Status: DC
  Administered 2017-10-30: 1.8 mg via INTRAVENOUS
  Administered 2017-10-30: 19:00:00 via INTRAVENOUS
  Administered 2017-10-31: 0.8 mg via INTRAVENOUS
  Administered 2017-10-31: 0.6 mg via INTRAVENOUS
  Filled 2017-10-30: qty 25

## 2017-10-30 MED ORDER — ONDANSETRON HCL 4 MG/2ML IJ SOLN
INTRAMUSCULAR | Status: AC
  Filled 2017-10-30: qty 2

## 2017-10-30 SURGICAL SUPPLY — 39 items
BENZOIN TINCTURE PRP APPL 2/3 (GAUZE/BANDAGES/DRESSINGS) ×2 IMPLANT
CANISTER SUCT 3000ML PPV (MISCELLANEOUS) ×2 IMPLANT
CONT PATH 16OZ SNAP LID 3702 (MISCELLANEOUS) ×2 IMPLANT
DRAIN PENROSE 1/4X12 LTX (DRAIN) IMPLANT
DRAPE CESAREAN BIRTH W POUCH (DRAPES) ×2 IMPLANT
DRSG OPSITE POSTOP 4X10 (GAUZE/BANDAGES/DRESSINGS) ×2 IMPLANT
DURAPREP 26ML APPLICATOR (WOUND CARE) ×2 IMPLANT
GAUZE SPONGE 4X4 12PLY STRL (GAUZE/BANDAGES/DRESSINGS) ×2 IMPLANT
GAUZE SPONGE 4X4 16PLY XRAY LF (GAUZE/BANDAGES/DRESSINGS) ×2 IMPLANT
GLOVE BIO SURGEON STRL SZ7 (GLOVE) ×2 IMPLANT
GLOVE BIOGEL PI IND STRL 7.0 (GLOVE) ×3 IMPLANT
GLOVE BIOGEL PI INDICATOR 7.0 (GLOVE) ×3
GOWN STRL REUS W/TWL LRG LVL3 (GOWN DISPOSABLE) ×4 IMPLANT
GOWN STRL REUS W/TWL XL LVL3 (GOWN DISPOSABLE) ×2 IMPLANT
NEEDLE HYPO 22GX1.5 SAFETY (NEEDLE) ×4 IMPLANT
NEEDLE SPNL 22GX3.5 QUINCKE BK (NEEDLE) ×2 IMPLANT
NS IRRIG 1000ML POUR BTL (IV SOLUTION) ×2 IMPLANT
PACK ABDOMINAL GYN (CUSTOM PROCEDURE TRAY) ×2 IMPLANT
PAD ABD 7.5X8 STRL (GAUZE/BANDAGES/DRESSINGS) ×2 IMPLANT
PAD OB MATERNITY 4.3X12.25 (PERSONAL CARE ITEMS) ×2 IMPLANT
PENCIL SMOKE EVAC W/HOLSTER (ELECTROSURGICAL) ×2 IMPLANT
PROTECTOR NERVE ULNAR (MISCELLANEOUS) ×4 IMPLANT
RTRCTR C-SECT PINK 25CM LRG (MISCELLANEOUS) IMPLANT
SPONGE LAP 18X18 RF (DISPOSABLE) ×6 IMPLANT
SPONGE LAP 18X18 X RAY DECT (DISPOSABLE) ×8 IMPLANT
STRIP CLOSURE SKIN 1/2X4 (GAUZE/BANDAGES/DRESSINGS) ×2 IMPLANT
SUT VIC AB 0 CT1 18XCR BRD8 (SUTURE) ×2 IMPLANT
SUT VIC AB 0 CT1 27 (SUTURE) ×13
SUT VIC AB 0 CT1 27XBRD ANBCTR (SUTURE) ×13 IMPLANT
SUT VIC AB 0 CT1 8-18 (SUTURE) ×2
SUT VIC AB 3-0 CT1 27 (SUTURE) ×2
SUT VIC AB 3-0 CT1 TAPERPNT 27 (SUTURE) ×2 IMPLANT
SUT VIC AB 3-0 SH 27 (SUTURE)
SUT VIC AB 3-0 SH 27X BRD (SUTURE) IMPLANT
SUT VIC AB 4-0 KS 27 (SUTURE) ×2 IMPLANT
SUT VICRYL 0 TIES 12 18 (SUTURE) ×2 IMPLANT
SYR CONTROL 10ML LL (SYRINGE) ×4 IMPLANT
TOWEL OR 17X24 6PK STRL BLUE (TOWEL DISPOSABLE) ×4 IMPLANT
TRAY FOLEY W/BAG SLVR 14FR (SET/KITS/TRAYS/PACK) ×2 IMPLANT

## 2017-10-30 NOTE — Anesthesia Preprocedure Evaluation (Addendum)
Anesthesia Evaluation  Patient identified by MRN, date of birth, ID band Patient awake    Reviewed: Allergy & Precautions, NPO status , Patient's Chart, lab work & pertinent test results, reviewed documented beta blocker date and time   Airway Mallampati: III  TM Distance: >3 FB Neck ROM: Full   Comment: Bilateral eye injection, per patient due to allergies, right greater than left Dental no notable dental hx.    Pulmonary asthma (mild, controlled) ,    Pulmonary exam normal breath sounds clear to auscultation       Cardiovascular hypertension, Pt. on home beta blockers Normal cardiovascular exam Rhythm:Regular Rate:Normal  ECG: SR, rate 89  ECHO: LV EF: 55% - 60%   Neuro/Psych Anxiety negative neurological ROS     GI/Hepatic negative GI ROS, Neg liver ROS,   Endo/Other  Super obese  Renal/GU negative Renal ROS     Musculoskeletal negative musculoskeletal ROS (+)   Abdominal (+) + obese,   Peds  Hematology Hyperlipidemia   Anesthesia Other Findings Uterine Fibroids  Reproductive/Obstetrics hcg negative                           Anesthesia Physical Anesthesia Plan  ASA: IV  Anesthesia Plan: General   Post-op Pain Management:    Induction: Intravenous  PONV Risk Score and Plan: 3 and Ondansetron, Dexamethasone, Midazolam, Scopolamine patch - Pre-op and Treatment may vary due to age or medical condition  Airway Management Planned: Oral ETT  Additional Equipment:   Intra-op Plan:   Post-operative Plan: Extubation in OR  Informed Consent: I have reviewed the patients History and Physical, chart, labs and discussed the procedure including the risks, benefits and alternatives for the proposed anesthesia with the patient or authorized representative who has indicated his/her understanding and acceptance.   Dental advisory given  Plan Discussed with: CRNA  Anesthesia Plan Comments:          Anesthesia Quick Evaluation

## 2017-10-30 NOTE — Op Note (Addendum)
10/30/2017  5:23 PM  PATIENT:  Jeanne [name] Age  37 y.o. female  PRE-OPERATIVE DIAGNOSIS:  Uterine Fibroids  POST-OPERATIVE DIAGNOSIS:  uterine fibroids   PROCEDURE:  Procedure(s): ABDOMINAL MYOMECTOMY (N/A)  SURGEON:  Surgeon(s) and Role:    * Lavonia Drafts, MD - Primary    * Sloan Leiter, MD - Assisting  ANESTHESIA:   general  EBL:  400 mL   BLOOD ADMINISTERED:none  DRAINS: none   LOCAL MEDICATIONS USED:  MARCAINE; dilute vasopressin solution     SPECIMEN:  Source of Specimen:  fibroids  DISPOSITION OF SPECIMEN:  PATHOLOGY  COUNTS:  YES  TOURNIQUET:  * No tourniquets in log *  DICTATION: .Note written in EPIC  PLAN OF CARE: Admit to inpatient   PATIENT DISPOSITION:  PACU - hemodynamically stable.   Delay start of Pharmacological VTE agent (>24hrs) due to surgical blood loss or risk of bleeding: no  Complications: none immediate  Pt here for abdominal myomectomy for symptomatic uterine leiomyomata. The risks of surgery were discussed with the patient including but not limited to: bleeding which may require transfusion or reoperation; infection which may require antibiotics; injury to bowel, bladder, ureters or other surrounding organs; need for additional procedures; thromboembolic phenomenon, incisional problems and other postoperative/anesthesia complications. Written informed consent was obtained.    Findings: 3 large pedunculated fibroids. 1 very large submucosal fibroid. The right pedunculated fibroid was attached to the fallopian tube. The right ovary was normal in appearance. The left fallopian tube and ovary were normal in appearance and appeared to be far from any fibroid involvement.  The cervix was immobile due to the fibroids and chromotubation was not possible.  PROCEDURE IN DETAIL:  The patient received IV preoperative antibiotics approximately 30 minutes prior to procedure.  She was then taken to the operating room and general anesthesia was  administered without difficulty.  .She was placed in dorsal supine position and prepped and draped in a sterile manner.  A Foley catheter was inserted into the bladder and attached to constant drainage.  After an adequate timeout was performed, attention was then turned to the vagina where a speculum was placed. An attempt was made to place a uterine canula to perform a chromotubation without success.   Attention was then directed to the abdomen where a transverse incision was made with a scalpel.  This incision was carried down to the fascia using electrocautery.  The fascia was incised in the midline and this incision was extended bilaterally using the Mayo scissors.  Kocher's were applied to the superior aspect of the fascial incision, and the rectus muscles were dissected off bluntly and sharply using the Mayo scissors.  A similar process was carried out at the inferior aspect of the incision.  The rectus muscles were separated in the midline bluntly and the peritoneum was identified, picked up and incised with the scalpel.  This incision was extended superiorly and inferiorly using the scalpel with good visualization of the bladder and bowel.  Attention was then turned to the uterus where multiple uterine leiomyomata were noted.  A tourniquet was made using a JP drain to decrease intraop blood loss. A towel clamp was used on the uterine fundus for retraction of the uterus.  The uterus was then delivered up through the incision with the towel clamp. Attention was then turned to the uterine surface where the largest leiomyoma was identified. Vasopressin solution was injected over the surface of the leiomyoma to aid with hemostasis.   A vertical incision  was made over the anterior uterine leiomyoma into the leiomyoma, and the capsule was recognized.  Using blunt and sharp methods, the leiomyoma was freed from the surrounding myometrial tissue and removed intact.  The posterior myoma was removed in similar fashion.   After removal of all the leiomyomata which were both on the anterior and posterior aspects of the uterus, the incisions were closed in multiple  layers, initiated by using 0 Vicryl in a running stitch.  The deeper layers were also reapproximated using 0 Vicryl running stitches, and the serosa was reapproximated using 0 Vicryl imbricating stitch. The pedunculated fibroids were all removed using Kelly clamps across the pedical's then transected and suture ligated. The right pedunculated fibroid was under the fallopian tube and it was not possible to remove it without interrupting the isthmic region of the fallopian tube. htou berop Overall good hemostasis was noted.   The uterus was returned to the abdomen.  The laparotomy sponges were then removed from the abdomen.  The pelvic was irrigated with warmed normal saline, and Arista was applied. The muscle and peritoneum were re approximated using 1 figure of eight suture of 0 vicryl.  The fascia was reapproximated with 0 Vicryl running stitch and the subcutaneous layer was reapproximated with 3-0 vicryl in a running suture in 2 layers.  The skin was closed with 3-0 Vicryl subcuticular stitch using a Lanny Hurst needle.  The patient tolerated the procedure well. There were no complications during this case.  Sponge, lap, needle and instrument counts were correct x2.  The patient was taken to the recovery room extubated and in stable condition. Due to the incisions on the uterus, any pregnancy should be DELIVERED VIA CAESAREAN SECTION AT 36 weeks and treated as a classical uterine incision.   An experienced assistant was required given the standard of surgical care given the complexity of the case.  This assistant was needed for exposure, dissection, suctioning, retraction, instrument exchange, assisting with delivery with administration of fundal pressure, and for overall help during the procedure.  Kelissa Merlin L. Harraway-Smith, M.D., Cherlynn June

## 2017-10-30 NOTE — Brief Op Note (Signed)
10/30/2017  5:23 PM  PATIENT:  Jeanne [name]  37 y.o. female  PRE-OPERATIVE DIAGNOSIS:  Uterine Fibroids  POST-OPERATIVE DIAGNOSIS:  uterine fibroids   PROCEDURE:  Procedure(s): ABDOMINAL MYOMECTOMY (N/A)  SURGEON:  Surgeon(s) and Role:    * Lavonia Drafts, MD - Primary    * Sloan Leiter, MD - Assisting  ANESTHESIA:   general  EBL:  400 mL   BLOOD ADMINISTERED:none  DRAINS: none   LOCAL MEDICATIONS USED:  MARCAINE     SPECIMEN:  Source of Specimen:  fibroids  DISPOSITION OF SPECIMEN:  PATHOLOGY  COUNTS:  YES  TOURNIQUET:  * No tourniquets in log *  DICTATION: .Note written in EPIC  PLAN OF CARE: Admit to inpatient   PATIENT DISPOSITION:  PACU - hemodynamically stable.   Delay start of Pharmacological VTE agent (>24hrs) due to surgical blood loss or risk of bleeding: no  Complications: none immediate  Jeanne [name] L. Harraway-Smith, M.D., Cherlynn June

## 2017-10-30 NOTE — H&P (Signed)
Preoperative History and Physical  Jeanne [name] is a 37 y.o. G0P0000 here for surgical management of uterine fibroids.   Proposed surgery: abdominal myomectomy  Past Medical History:  Diagnosis Date  . Anxiety   . Asthma   . Dry eyes   . Dry skin   . Eczema   . Fatigue   . Fibroids   . H/O echocardiogram    only showed tachycardia  . Heartburn   . HTN (hypertension)   . Hyperlipidemia   . Palpitations   . Pre-diabetes     pre diabetes, Metformin ordered from weight loss clinic  . Rash in adult   . Shortness of breath    due to allergies  . Stress   . Tachycardia   . Vitamin D deficiency   . Wheezing    Past Surgical History:  Procedure Laterality Date  . EYE SURGERY Bilateral 2006   OB History    Gravida  0   Para  0   Term  0   Preterm  0   AB  0   Living  0     SAB  0   TAB  0   Ectopic  0   Multiple  0   Live Births  0          Patient denies any cervical dysplasia or STIs. Medications Prior to Admission  Medication Sig Dispense Refill Last Dose  . acetaminophen (TYLENOL) 500 MG tablet Take 1,000 mg by mouth every 6 (six) hours as needed (for pain/headaches.).    Taking  . beclomethasone (QVAR) 40 MCG/ACT inhaler Inhale 2 puffs into the lungs 2 (two) times daily. (Patient taking differently: Inhale 2 puffs into the lungs 2 (two) times daily as needed (for breathing difficulties due to seasonal allergies.). ) 1 Inhaler 3 Taking  . calcium carbonate (TUMS - DOSED IN MG ELEMENTAL CALCIUM) 500 MG chewable tablet Chew 1 tablet by mouth 3 (three) times daily as needed for indigestion or heartburn.    Taking  . diphenhydrAMINE HCl, Sleep, (ZZZQUIL) 25 MG CAPS Take 25 mg by mouth at bedtime as needed (for sleep.).   Taking  . metoprolol succinate (TOPROL-XL) 50 MG 24 hr tablet Take 1 tablet (50 mg total) by mouth daily. Take with or immediately following a meal. 90 tablet 3 Taking  . Norethindrone Acetate-Ethinyl Estrad-FE (LOESTRIN 24 FE) 1-20  MG-MCG(24) tablet Take 1 tablet by mouth daily. (Patient taking differently: Take 1 tablet by mouth at bedtime. ) 1 Package 11 Taking  . Polyethyl Glycol-Propyl Glycol (LUBRICANT EYE DROPS) 0.4-0.3 % SOLN Place 1 drop into both eyes 3 (three) times daily as needed (for dry/allergy eyes.).   Taking  . PROAIR HFA 108 (90 Base) MCG/ACT inhaler INHALE 2 PUFFS INTO THE LUNGS EVERY 6 (SIX) HOURS AS NEEDED FOR WHEEZING OR SHORTNESS OF BREATH. 8.5 Inhaler 1 Taking  . sertraline (ZOLOFT) 100 MG tablet Take 100 mg by mouth daily.   Taking  . triamcinolone cream (KENALOG) 0.1 % Apply 1 application topically 2 (two) times daily as needed (for eczema on hands.).   Taking  . metFORMIN (GLUCOPHAGE) 500 MG tablet Take 1 tablet (500 mg total) by mouth daily with breakfast. 30 tablet 0   . naproxen (NAPROSYN) 500 MG tablet Take 1 tablet (500 mg total) by mouth 2 (two) times daily with a meal. 30 tablet 0 Taking  . Vitamin D, Ergocalciferol, (DRISDOL) 50000 units CAPS capsule Take 1 capsule (50,000 Units total) by mouth every Saturday.  4 capsule 0     No Known Allergies Social History:   reports that she has never smoked. She has never used smokeless tobacco. She reports that she does not drink alcohol or use drugs. Family History  Problem Relation Age of Onset  . Thyroid disease Mother   . Fibroids Mother   . Deep vein thrombosis Mother   . Cancer Maternal Grandmother        liver   . Cancer Maternal Grandfather        pancreatic  . Hyperlipidemia Father   . Hypertension Father   . Sleep apnea Father   . Obesity Father   . Cancer Paternal Grandfather        prostate    Review of Systems: Noncontributory  PHYSICAL EXAM: There were no vitals taken for this visit. General appearance - alert, well appearing, and in no distress Chest - clear to auscultation, no wheezes, rales or rhonchi, symmetric air entry Heart - normal rate and regular rhythm Abdomen - soft, nontender, nondistended, no masses or  organomegaly Pelvic - examination not indicated Extremities - peripheral pulses normal, no pedal edema, no clubbing or cyanosis  Labs: Results for orders placed or performed during the hospital encounter of 10/17/17 (from the past 336 hour(s))  CBC   Collection Time: 10/17/17  3:48 PM  Result Value Ref Range   WBC 9.0 4.0 - 10.5 K/uL   RBC 4.69 3.87 - 5.11 MIL/uL   Hemoglobin 14.5 12.0 - 15.0 g/dL   HCT 43.2 36.0 - 46.0 %   MCV 92.1 78.0 - 100.0 fL   MCH 30.9 26.0 - 34.0 pg   MCHC 33.6 30.0 - 36.0 g/dL   RDW 13.6 11.5 - 15.5 %   Platelets 326 150 - 400 K/uL  Basic metabolic panel   Collection Time: 10/17/17  3:48 PM  Result Value Ref Range   Sodium 138 135 - 145 mmol/L   Potassium 4.1 3.5 - 5.1 mmol/L   Chloride 107 101 - 111 mmol/L   CO2 21 (L) 22 - 32 mmol/L   Glucose, Bld 93 65 - 99 mg/dL   BUN 18 6 - 20 mg/dL   Creatinine, Ser 0.51 0.44 - 1.00 mg/dL   Calcium 9.4 8.9 - 10.3 mg/dL   GFR calc non Af Amer >60 >60 mL/min   GFR calc Af Amer >60 >60 mL/min   Anion gap 10 5 - 15  Prepare RBC (crossmatch)   Collection Time: 10/17/17  3:48 PM  Result Value Ref Range   Order Confirmation      ORDER PROCESSED BY BLOOD BANK Performed at [facility], [address] [place], [place] [address]   Type and screen [place]   Collection Time: 10/17/17  3:48 PM  Result Value Ref Range   ABO/RH(D) A POS    Antibody Screen NEG    Sample Expiration 10/31/2017    Extend sample reason NO TRANSFUSIONS OR PREGNANCY IN THE PAST 3 MONTHS    Unit Number [id number]    Blood Component Type RED CELLS,LR    Unit division 00    Status of Unit REL FROM [facility]    Transfusion Status OK TO TRANSFUSE    Crossmatch Result Compatible    Unit Number [id number]    Blood Component Type RED CELLS,LR    Unit division 00    Status of Unit REL FROM [facility]    Transfusion Status OK TO TRANSFUSE    Crossmatch Result Compatible  Unit Number [id number]    Blood Component  Type RED CELLS,LR    Unit division 00    Status of Unit REL FROM [name]    Transfusion Status OK TO TRANSFUSE    Crossmatch Result      Compatible Performed at [facility], [address], [place], [place] [address]    Unit Number [id number]    Blood Component Type RED CELLS,LR    Unit division 00    Status of Unit REL FROM [facility]    Transfusion Status OK TO TRANSFUSE    Crossmatch Result Compatible   ABO/Rh   Collection Time: 10/17/17  3:48 PM  Result Value Ref Range   ABO/RH(D)      A POS Performed at [facility], [address] [place], [place] [address]   BPAM [facility]   Collection Time: 10/17/17  3:48 PM  Result Value Ref Range   ISSUE DATE / TIME [id number]    Blood Product Unit Number [id number]    PRODUCT CODE N2355D32    Unit Type and Rh 5100    Blood Product Expiration Date 202542706237    ISSUE DATE / TIME [id number]    Blood Product Unit Number [id number]    PRODUCT CODE [id number]    Unit Type and Rh 5100    Blood Product Expiration Date 938182993716    Blood Product Unit Number [id number]    Unit Type and Rh 0258    Blood Product Expiration Date 527782423536    Blood Product Unit Number [id number]    Unit Type and Rh 1950    Blood Product Expiration Date 932671245809     Imaging Studies: No results found.  Assessment: Patient Active Problem List   Diagnosis Date Noted  . Fibroids 10/30/2017  . Morbid obesity ([place]) 08/17/2017  . Eczema of both hands 07/03/2017  . Sinus tachycardia 07/03/2017  . Asthma 12/15/2016  . HTN (hypertension) 01/25/2016  . Eczema 03/04/2015    Plan: Patient will undergo surgical management with abdominal myomectomy.   The risks of surgery were discussed in detail with the patient including but not limited to: bleeding which may require transfusion or reoperation; infection which may require antibiotics; injury to surrounding organs which may involve bowel, bladder, ureters ; need for additional  procedures including laparoscopy or laparotomy; thromboembolic phenomenon, surgical site problems and other postoperative/anesthesia complications. Likelihood of success in alleviating the patient's condition was discussed. Routine postoperative instructions will be reviewed with the patient and her family in detail after surgery.  The patient concurred with the proposed plan, giving informed written consent for the surgery.  Patient has been NPO since last night she will remain NPO for procedure.  Anesthesia and OR aware.  Preoperative prophylactic antibiotics and SCDs ordered on call to the OR.  To OR when ready.  Jazzelle Zhang L. Ihor Dow, M.D., [facility] 10/30/2017 12:24 PM

## 2017-10-30 NOTE — Anesthesia Procedure Notes (Signed)
Procedure Name: Intubation Date/Time: 10/30/2017 2:30 PM Performed by: Murvin Natal, MD Pre-anesthesia Checklist: Patient identified, Patient being monitored, Timeout performed, Emergency Drugs available and Suction available Patient Re-evaluated:Patient Re-evaluated prior to induction Oxygen Delivery Method: Circle System Utilized Preoxygenation: Pre-oxygenation with 100% oxygen Induction Type: IV induction Ventilation: Mask ventilation without difficulty Laryngoscope Size: Miller and 2 Grade View: Grade II Tube type: Oral Tube size: 7.0 mm Number of attempts: 1 Airway Equipment and Method: stylet Placement Confirmation: ETT inserted through vocal cords under direct vision,  positive ETCO2 and breath sounds checked- equal and bilateral Secured at: 21 cm Tube secured with: Tape Dental Injury: Teeth and Oropharynx as per pre-operative assessment

## 2017-10-30 NOTE — Transfer of Care (Signed)
Immediate Anesthesia Transfer of Care Note  Patient: Jeanne [name]  Procedure(s) Performed: ABDOMINAL MYOMECTOMY (N/A Abdomen)  Patient Location: PACU  Anesthesia Type:General  Level of Consciousness: awake, alert  and oriented  Airway & Oxygen Therapy: Patient Spontanous Breathing and Patient connected to nasal cannula oxygen  Post-op Assessment: Report given to RN and Post -op Vital signs reviewed and stable  Post vital signs: Reviewed and stable  Last Vitals:  Vitals Value Taken Time  BP    Temp    Pulse 96 10/30/2017  5:27 PM  Resp 14 10/30/2017  5:27 PM  SpO2 95 % 10/30/2017  5:27 PM  Vitals shown include unvalidated device data.  Last Pain:  Vitals:   10/30/17 1240  TempSrc: Oral  PainSc: 0-No pain      Patients Stated Pain Goal: 3 (86/82/57 4935)  Complications: No apparent anesthesia complications

## 2017-10-31 LAB — CBC
HCT: 40.2 % (ref 36.0–46.0)
Hemoglobin: 13.6 g/dL (ref 12.0–15.0)
MCH: 31.9 pg (ref 26.0–34.0)
MCHC: 33.8 g/dL (ref 30.0–36.0)
MCV: 94.1 fL (ref 78.0–100.0)
Platelets: 268 10*3/uL (ref 150–400)
RBC: 4.27 MIL/uL (ref 3.87–5.11)
RDW: 13.6 % (ref 11.5–15.5)
WBC: 13.3 10*3/uL — AB (ref 4.0–10.5)

## 2017-10-31 LAB — GLUCOSE, CAPILLARY
GLUCOSE-CAPILLARY: 130 mg/dL — AB (ref 65–99)
GLUCOSE-CAPILLARY: 134 mg/dL — AB (ref 65–99)

## 2017-10-31 MED ORDER — DOCUSATE SODIUM 100 MG PO CAPS: 100.0000 mg | ORAL_CAPSULE | Freq: Two times a day (BID) | ORAL | 0 refills | Status: DC

## 2017-10-31 MED ORDER — BISACODYL 10 MG RE SUPP
10.0000 mg | Freq: Once | RECTAL | Status: AC
  Administered 2017-10-31: 10 mg via RECTAL
  Filled 2017-10-31: qty 1

## 2017-10-31 MED ORDER — IBUPROFEN 600 MG PO TABS: 600.0000 mg | ORAL_TABLET | Freq: Four times a day (QID) | ORAL | 0 refills | Status: DC | PRN

## 2017-10-31 MED ORDER — OXYCODONE-ACETAMINOPHEN 5-325 MG PO TABS: 1.0000 | ORAL_TABLET | Freq: Four times a day (QID) | ORAL | 0 refills | Status: DC | PRN

## 2017-10-31 NOTE — Anesthesia Postprocedure Evaluation (Signed)
Anesthesia Post Note  Patient: Jeanne [name]  Procedure(s) Performed: ABDOMINAL MYOMECTOMY (N/A Abdomen)     Patient location during evaluation: PACU Anesthesia Type: General Level of consciousness: awake and alert Pain management: pain level controlled Vital Signs Assessment: post-procedure vital signs reviewed and stable Respiratory status: spontaneous breathing, nonlabored ventilation, respiratory function stable and patient connected to nasal cannula oxygen Cardiovascular status: blood pressure returned to baseline and stable Postop Assessment: no apparent nausea or vomiting Anesthetic complications: no    Last Vitals:  Vitals:   10/31/17 0220 10/31/17 0603  BP:  119/84  Pulse:  81  Resp: 18 17  Temp:    SpO2:  98%    Last Pain:  Vitals:   10/31/17 0637  TempSrc:   PainSc: 0-No pain                 Sheilah Rayos P Icesis Renn

## 2017-10-31 NOTE — Discharge Instructions (Signed)
Myomectomy, Care After °Refer to this sheet in the next few weeks. These instructions provide you with information on caring for yourself after your procedure. Your health care provider may also give you more specific instructions. Your treatment has been planned according to current medical practices, but problems sometimes occur. Call your health care provider if you have any problems or questions after your procedure. °What can I expect after the procedure? °After your procedure, it is typical to have the following: °· Pain in your abdomen, especially at any incision sites. You will be given pain medicine to control the pain. °· Tiredness. This is a normal part of the recovery process. Your energy level will return to normal over the next several weeks. °· Constipation. °· Vaginal bleeding. This is normal and should stop after 1-2 weeks. ° °Follow these instructions at home: °· Only take over-the-counter or prescription medicines as directed by your health care provider. Avoid aspirin because it can cause bleeding. °· Do not douche, use tampons, or have sexual intercourse until given permission by your health care provider. °· Remove or change any bandages (dressings) as directed by your health care provider. °· Take showers instead of baths as directed by your health care provider. °· You will probably be able to go back to your normal routine after a few days. Do not do anything that requires extra effort until your health care provider says it is okay. Do not lift anything heavier than 15 pounds (6.8 kg) until your health care provider approves. °· Walk daily but take frequent rest breaks if you tire easily. °· Continue to practice deep breathing and coughing. If it hurts to cough, try holding a pillow against your belly as you cough. °· If you become constipated, you may: °? Use a mild laxative if your health care provider approves. °? Add more fruit and bran to your diet. °? Drink enough fluids to keep your  urine clear or pale yellow. °· Take your temperature twice a day and write it down. °· Do not drink alcohol. °· Do not drive until your health care provider approves. °· Have someone help you at home for 1 week or until you can do your own household activities. °· Follow up with your health care provider as directed. °Contact a health care provider if: °· You have a fever. °· You have increasing abdominal pain that is not relieved with medicine. °· You have nausea, vomiting, or diarrhea. °· You have pain when you urinate, or you have blood in your urine. °· You have a rash on your body. °· You have pain or redness where your IV access tube was inserted. °· You have redness, swelling, or any kind of drainage from an incision. °Get help right away if: °· You have weakness or lightheadedness. °· You have pain, swelling, or redness in your legs. °· You have chest pain. °· You faint. °· You have shortness of breath. °· You have heavy vaginal bleeding. °· Your incision is opening up. °This information is not intended to replace advice given to you by your health care provider. Make sure you discuss any questions you have with your health care provider. °Document Released: 10/12/2010 Document Revised: 10/28/2015 Document Reviewed: 01/01/2013 °Elsevier Interactive Patient Education © 2017 [employer] ° °

## 2017-10-31 NOTE — Progress Notes (Signed)
Discharge teaching complete with pt. Pt understood all information and did not have any questions. Pt discharged home to family. 

## 2017-10-31 NOTE — Anesthesia Postprocedure Evaluation (Signed)
Anesthesia Post Note  Patient: Jeanne [name]  Procedure(s) Performed: ABDOMINAL MYOMECTOMY (N/A Abdomen)     Patient location during evaluation: [facility] Anesthesia Type: General Level of consciousness: awake and alert, oriented and patient cooperative Pain management: pain level controlled Vital Signs Assessment: post-procedure vital signs reviewed and stable Respiratory status: spontaneous breathing, nonlabored ventilation, respiratory function stable and patient connected to nasal cannula oxygen Cardiovascular status: stable Postop Assessment: no apparent nausea or vomiting and adequate PO intake Anesthetic complications: no    Last Vitals:  Vitals:   10/31/17 0220 10/31/17 0603  BP:  119/84  Pulse:  81  Resp: 18 17  Temp:    SpO2:  98%    Last Pain:  Vitals:   10/31/17 0637  TempSrc:   PainSc: 0-No pain   Pain Goal: Patients Stated Pain Goal: 3 (10/30/17 2000)               Willa Rough

## 2017-10-31 NOTE — Discharge Summary (Signed)
Physician Discharge Summary  Patient ID: Jeanne [name] MRN: [id number] DOB/AGE: 1980-07-18 37 y.o.  Admit date: 10/30/2017 Discharge date: 10/31/2017  Admission Diagnoses:  Uterine fibroids; symptomatic   Discharge Diagnoses:  Principal Problem:   Fibroids Active Problems:   HTN (hypertension)   Asthma   Sinus tachycardia   Morbid obesity (HCC)   S/P myomectomy   Discharged Condition: good  Hospital Course: Patient had an uncomplicated surgery; for further details of this surgery, please refer to the operative note. Furthermore, the patient had an uncomplicated postoperative course.  By time of discharge, her pain was controlled on oral pain medications; she was ambulating, voiding without difficulty, tolerating regular diet and passing flatus.  She was deemed stable for discharge to home.    Consults: None  Significant Diagnostic Studies: labs: cbc  Treatments: surgery: abdominal myomectomy  Discharge Exam: Blood pressure 106/68, pulse 85, temperature 97.8 F (36.6 C), temperature source Oral, resp. rate 19, height 5\' 2"  (1.575 m), weight 291 lb (132 kg), SpO2 97 %. General appearance: alert and no distress Resp: clear to auscultation bilaterally Cardio: regular rate and rhythm, S1, S2 normal, no murmur, click, rub or gallop GI: soft, non-tender; bowel sounds normal; no masses,  no organomegaly Extremities: extremities normal, atraumatic, no cyanosis or edema Incision/Wound:dressing clean and dry.  CBC Latest Ref Rng & Units 10/31/2017 10/30/2017 10/17/2017  WBC 4.0 - 10.5 K/uL 13.3(H) 13.7(H) 9.0  Hemoglobin 12.0 - 15.0 g/dL 13.6 13.9 14.5  Hematocrit 36.0 - 46.0 % 40.2 43.2 43.2  Platelets 150 - 400 K/uL 268 252 326    Disposition: Discharge disposition: 01-Home or Self Care       Discharge Instructions    Call MD for:  difficulty breathing, headache or visual disturbances   Complete by:  As directed    Call MD for:  extreme fatigue   Complete by:  As  directed    Call MD for:  hives   Complete by:  As directed    Call MD for:  persistant dizziness or light-headedness   Complete by:  As directed    Call MD for:  persistant nausea and vomiting   Complete by:  As directed    Call MD for:  redness, tenderness, or signs of infection (pain, swelling, redness, odor or green/yellow discharge around incision site)   Complete by:  As directed    Call MD for:  severe uncontrolled pain   Complete by:  As directed    Call MD for:  temperature >100.4   Complete by:  As directed    Diet - low sodium heart healthy   Complete by:  As directed    Discharge wound care:   Complete by:  As directed    Remove outer honeycomb dressing 1 week from surgery   Driving Restrictions   Complete by:  As directed    No driving for 2 weeks   Increase activity slowly   Complete by:  As directed    Lifting restrictions   Complete by:  As directed    No heavy lifting for 4-6 weeks   Sexual Activity Restrictions   Complete by:  As directed    No sexual activity for 6 weeks     Allergies as of 10/31/2017   No Known Allergies     Medication List    STOP taking these medications   acetaminophen 500 MG tablet Commonly known as:  TYLENOL   naproxen 500 MG tablet Commonly known as:  NAPROSYN  Norethindrone Acetate-Ethinyl Estrad-FE 1-20 MG-MCG(24) tablet Commonly known as:  LOESTRIN 24 FE     TAKE these medications   beclomethasone 40 MCG/ACT inhaler Commonly known as:  QVAR Inhale 2 puffs into the lungs 2 (two) times daily. What changed:    when to take this  reasons to take this   calcium carbonate 500 MG chewable tablet Commonly known as:  TUMS - dosed in mg elemental calcium Chew 1 tablet by mouth 3 (three) times daily as needed for indigestion or heartburn.   docusate sodium 100 MG capsule Commonly known as:  COLACE Take 1 capsule (100 mg total) by mouth 2 (two) times daily.   ibuprofen 600 MG tablet Commonly known as:   ADVIL,MOTRIN Take 1 tablet (600 mg total) by mouth every 6 (six) hours as needed (mild pain).   LUBRICANT EYE DROPS 0.4-0.3 % Soln Generic drug:  Polyethyl Glycol-Propyl Glycol Place 1 drop into both eyes 3 (three) times daily as needed (for dry/allergy eyes.).   metFORMIN 500 MG tablet Commonly known as:  GLUCOPHAGE Take 1 tablet (500 mg total) by mouth daily with breakfast.   metoprolol succinate 50 MG 24 hr tablet Commonly known as:  TOPROL-XL Take 1 tablet (50 mg total) by mouth daily. Take with or immediately following a meal.   oxyCODONE-acetaminophen 5-325 MG tablet Commonly known as:  PERCOCET/ROXICET Take 1 tablet by mouth every 6 (six) hours as needed for moderate pain ((when tolerating fluids)).   PROAIR HFA 108 (90 Base) MCG/ACT inhaler Generic drug:  albuterol INHALE 2 PUFFS INTO THE LUNGS EVERY 6 (SIX) HOURS AS NEEDED FOR WHEEZING OR SHORTNESS OF BREATH.   sertraline 100 MG tablet Commonly known as:  ZOLOFT Take 100 mg by mouth daily.   triamcinolone cream 0.1 % Commonly known as:  KENALOG Apply 1 application topically 2 (two) times daily as needed (for eczema on hands.).   Vitamin D (Ergocalciferol) 50000 units Caps capsule Commonly known as:  DRISDOL Take 1 capsule (50,000 Units total) by mouth every Saturday.   ZZZQUIL 25 MG Caps Generic drug:  diphenhydrAMINE HCl (Sleep) Take 25 mg by mouth at bedtime as needed (for sleep.).            Discharge Care Instructions  (From admission, onward)        Start     Ordered   10/31/17 0000  Discharge wound care:    Comments:  Remove outer honeycomb dressing 1 week from surgery   10/31/17 1623       Signed: Lavonia Drafts 10/31/2017, 4:23 PM

## 2017-10-31 NOTE — Addendum Note (Signed)
Addendum  created 10/31/17 0755 by Jonna Munro, CRNA   Sign clinical note

## 2017-11-01 ENCOUNTER — Telehealth: Payer: Self-pay

## 2017-11-01 ENCOUNTER — Encounter (HOSPITAL_COMMUNITY): Payer: Self-pay | Admitting: Obstetrics & Gynecology

## 2017-11-01 NOTE — Telephone Encounter (Signed)
11/01/17  Transition Care Management Follow-up Telephone Call  ADMISSION DATE: 10/30/17  DISCHARGE DATE: 10/31/17  How have you been since you were released from the hospital?  Patient states she feels pretty good just has soreness in lower abdomen.  Do you understand why you were in the hospital? Yes   Do you understand the discharge instrcutions? Yes    Items Reviewed:  Medications reviewed: Yes   Allergies reviewed: NKDA   Dietary changes reviewed: Regular Diet   Referrals reviewed: Appointment scheduled   Functional Questionnaire:   Activities of Daily Living (ADLs): Yes patient independent in all ADL's  Any patient concerns?  Patient would like to keep appointment for physical scheduled with Dr. Carollee Herter.   Confirmed importance and date/time of follow-up visits scheduled: Yes   Confirmed with patient if condition begins to worsen call PCP or go to the ER. Yes    Patient was given the office number and encouragred to call back with questions or concerns.Yes

## 2017-11-06 ENCOUNTER — Ambulatory Visit (INDEPENDENT_AMBULATORY_CARE_PROVIDER_SITE_OTHER): Payer: BC Managed Care – PPO | Admitting: Family Medicine

## 2017-11-06 VITALS — BP 118/83 | HR 78 | Temp 97.9°F | Ht 62.0 in | Wt 283.0 lb

## 2017-11-06 DIAGNOSIS — E559 Vitamin D deficiency, unspecified: Secondary | ICD-10-CM

## 2017-11-06 DIAGNOSIS — Z6841 Body Mass Index (BMI) 40.0 and over, adult: Secondary | ICD-10-CM

## 2017-11-06 DIAGNOSIS — R7303 Prediabetes: Secondary | ICD-10-CM

## 2017-11-06 NOTE — Progress Notes (Signed)
Office: [phone]  /  Fax: [phone]   HPI:   Chief Complaint: OBESITY Jeanne [name] is here to discuss her progress with her obesity treatment plan. She is on the Category 4 plan and is following her eating plan approximately 20 % of the time. She states she is exercising 0 minutes 0 times per week. Jeanne [name] just had fibroid surgery, struggled with following meal plan secondary to hospitalization.  Her weight is 283 lb (128.4 kg) today and has had a weight loss of 4 pounds over a period of 3 weeks since her last visit. She has lost 14 lbs since starting treatment with [place].  Pre-Diabetes Jeanne [name] has a diagnosis of pre-diabetes based on her elevated Hgb A1c and was informed this puts her at greater risk of developing diabetes. She is taking metformin currently and continues to work on diet and exercise to decrease risk of diabetes. She denies carbohydrate cravings or hypoglycemia.  Vitamin D Deficiency Jeanne [name] has a diagnosis of vitamin D deficiency. She is currently taking prescription Vit D and denies nausea, vomiting or muscle weakness.  ALLERGIES: No Known Allergies  MEDICATIONS: Current Outpatient Medications on File Prior to Visit  Medication Sig Dispense Refill  . beclomethasone (QVAR) 40 MCG/ACT inhaler Inhale 2 puffs into the lungs 2 (two) times daily. (Patient taking differently: Inhale 2 puffs into the lungs 2 (two) times daily as needed (for breathing difficulties due to seasonal allergies.). ) 1 Inhaler 3  . calcium carbonate (TUMS - DOSED IN MG ELEMENTAL CALCIUM) 500 MG chewable tablet Chew 1 tablet by mouth 3 (three) times daily as needed for indigestion or heartburn.     . diphenhydrAMINE HCl, Sleep, (ZZZQUIL) 25 MG CAPS Take 25 mg by mouth at bedtime as needed (for sleep.).    [name] ibuprofen (ADVIL,MOTRIN) 600 MG tablet Take 1 tablet (600 mg total) by mouth every 6 (six) hours as needed (mild pain). 30 tablet 0  . metFORMIN (GLUCOPHAGE) 500 MG tablet Take 1 tablet (500 mg total)  by mouth daily with breakfast. 30 tablet 0  . metoprolol succinate (TOPROL-XL) 50 MG 24 hr tablet Take 1 tablet (50 mg total) by mouth daily. Take with or immediately following a meal. 90 tablet 3  . Polyethyl Glycol-Propyl Glycol (LUBRICANT EYE DROPS) 0.4-0.3 % SOLN Place 1 drop into both eyes 3 (three) times daily as needed (for dry/allergy eyes.).    [name] PROAIR HFA 108 (90 Base) MCG/ACT inhaler INHALE 2 PUFFS INTO THE LUNGS EVERY 6 (SIX) HOURS AS NEEDED FOR WHEEZING OR SHORTNESS OF BREATH. 8.5 Inhaler 1  . sertraline (ZOLOFT) 100 MG tablet Take 100 mg by mouth daily.    [name] triamcinolone cream (KENALOG) 0.1 % Apply 1 application topically 2 (two) times daily as needed (for eczema on hands.).    [name] Vitamin D, Ergocalciferol, (DRISDOL) 50000 units CAPS capsule Take 1 capsule (50,000 Units total) by mouth every Saturday. 4 capsule 0   No current facility-administered medications on file prior to visit.     PAST MEDICAL HISTORY: Past Medical History:  Diagnosis Date  . Anxiety   . Asthma   . Dry eyes   . Dry skin   . Eczema   . Fatigue   . Fibroids   . H/O echocardiogram    only showed tachycardia  . Heartburn   . HTN (hypertension)   . Hyperlipidemia   . Palpitations   . Pre-diabetes     pre diabetes, Metformin ordered from weight loss clinic  . Rash in adult   .  Shortness of breath    due to allergies  . Stress   . Tachycardia   . Vitamin D deficiency   . Wheezing     PAST SURGICAL HISTORY: Past Surgical History:  Procedure Laterality Date  . EYE SURGERY Bilateral 2006   Lasik  . MYOMECTOMY N/A 10/30/2017   Procedure: ABDOMINAL MYOMECTOMY;  Surgeon: Lavonia Drafts, MD;  Location: [facility];  Service: Gynecology;  Laterality: N/A;    SOCIAL HISTORY: Social History   Tobacco Use  . Smoking status: Never Smoker  . Smokeless tobacco: Never Used  Substance Use Topics  . Alcohol use: No    Alcohol/week: 0.0 oz  . Drug use: No    FAMILY HISTORY: Family History    Problem Relation [name] of Onset  . Thyroid disease Mother   . Fibroids Mother   . Deep vein thrombosis Mother   . Cancer Maternal Grandmother        liver   . Cancer Maternal Grandfather        pancreatic  . Hyperlipidemia Father   . Hypertension Father   . Sleep apnea Father   . Obesity Father   . Cancer Paternal Grandfather        prostate    ROS: Review of Systems  Constitutional: Positive for weight loss.  Gastrointestinal: Negative for nausea and vomiting.  Musculoskeletal:       Negative muscle weakness  Endo/Heme/Allergies:       Negative hypoglycemia    PHYSICAL EXAM: Blood pressure 118/83, pulse 78, temperature 97.9 F (36.6 C), temperature source Oral, height 5\' 2"  (1.575 m), weight 283 lb (128.4 kg), SpO2 99 %. Body mass index is 51.76 kg/m. Physical Exam  Constitutional: She is oriented to person, place, and time. She appears well-developed and well-nourished.  Cardiovascular: Normal rate.  Pulmonary/Chest: Effort normal.  Musculoskeletal: Normal range of motion.  Neurological: She is oriented to person, place, and time.  Skin: Skin is warm and dry.  Psychiatric: She has a normal mood and affect. Her behavior is normal.  Vitals reviewed.   RECENT LABS AND TESTS: BMET    Component Value Date/Time   NA 138 10/17/2017 1548   K 4.1 10/17/2017 1548   CL 107 10/17/2017 1548   CO2 21 (L) 10/17/2017 1548   GLUCOSE 93 10/17/2017 1548   BUN 18 10/17/2017 1548   CREATININE 0.73 10/30/2017 1819   CALCIUM 9.4 10/17/2017 1548   GFRNONAA >60 10/30/2017 1819   GFRAA >60 10/30/2017 1819   Lab Results  Component Value Date   HGBA1C 5.9 (H) 09/10/2017   Lab Results  Component Value Date   INSULIN 12.3 09/10/2017   CBC    Component Value Date/Time   WBC 13.3 (H) 10/31/2017 0516   RBC 4.27 10/31/2017 0516   HGB 13.6 10/31/2017 0516   HCT 40.2 10/31/2017 0516   PLT 268 10/31/2017 0516   MCV 94.1 10/31/2017 0516   MCH 31.9 10/31/2017 0516   MCHC 33.8  10/31/2017 0516   RDW 13.6 10/31/2017 0516   LYMPHSABS 2.4 07/30/2017 1631   MONOABS 0.5 07/30/2017 1631   EOSABS 0.7 07/30/2017 1631   BASOSABS 0.1 07/30/2017 1631   Iron/TIBC/Ferritin/ %Sat No results found for: IRON, TIBC, FERRITIN, IRONPCTSAT Lipid Panel     Component Value Date/Time   CHOL 181 09/10/2017 1058   TRIG 92 09/10/2017 1058   HDL 50 09/10/2017 1058   CHOLHDL 3.6 09/10/2017 1058   CHOLHDL 4 07/03/2017 1609   VLDL 19.6 07/03/2017 1609  LDLCALC 113 (H) 09/10/2017 1058   Hepatic Function Panel     Component Value Date/Time   PROT 7.0 07/30/2017 1631   ALBUMIN 3.9 07/30/2017 1631   AST 14 07/30/2017 1631   ALT 15 07/30/2017 1631   ALKPHOS 79 07/30/2017 1631   BILITOT 0.2 07/30/2017 1631      Component Value Date/Time   TSH 3.340 09/10/2017 1058   TSH 3.55 07/30/2017 1631   TSH 2.28 08/17/2016 1707    ASSESSMENT AND PLAN: Prediabetes  Vitamin D deficiency  Class 3 severe obesity with serious comorbidity and body mass index (BMI) of 50.0 to 59.9 in adult, unspecified obesity type ([place])  PLAN:  Pre-Diabetes Jeanne [name] will continue to work on weight loss, exercise, and decreasing simple carbohydrates in her diet to help decrease the risk of diabetes. We dicussed metformin including benefits and risks. She was informed that eating too many simple carbohydrates or too many calories at one sitting increases the likelihood of GI side effects. Jeanne [name] agrees to continue taking metformin, no refill needed. Jeanne [name] agrees to follow up with our clinic in 2 weeks as directed to monitor her progress.  Vitamin D Deficiency Jeanne [name] was informed that low vitamin D levels contributes to fatigue and are associated with obesity, breast, and colon cancer. Jeanne [name] agrees to continue taking prescription Vit D @50 ,000 IU every week, no refill needed. She will follow up for routine testing of vitamin D, at least 2-3 times per year. She was informed of the risk of over-replacement of  vitamin D and agrees to not increase her dose unless she discusses this with [place] first. Jeanne [name] agrees to follow up with our clinic in 2 weeks.  We spent > than 50% of the 15 minute visit on the counseling as documented in the note.  Obesity Jeanne [name] is currently in the action stage of change. As such, her goal is to continue with weight loss efforts She has agreed to follow the Category 4 plan Jeanne [name] has been instructed to work up to a goal of 150 minutes of combined cardio and strengthening exercise per week for weight loss and overall health benefits. We discussed the following Behavioral Modification Strategies today: increasing lean protein intake, increasing vegetables, work on meal planning and easy cooking plans, and planning for success   Jeanne [name] has agreed to follow up with our clinic in 2 weeks. She was informed of the importance of frequent follow up visits to maximize her success with intensive lifestyle modifications for her multiple health conditions.   OBESITY BEHAVIORAL INTERVENTION VISIT  Today's visit was # 4 out of 22.  Starting weight: 297 lbs Starting date: 09/10/17 Today's weight : 283 lbs Today's date: 11/06/2017 Total lbs lost to date: 14 (Patients must lose 7 lbs in the first 6 months to continue with counseling)   ASK: We discussed the diagnosis of obesity with Jeanne [name] today and Jeanne [name] agreed to give [place] permission to discuss obesity behavioral modification therapy today.  ASSESS: Jeanne [name] has the diagnosis of obesity and her BMI today is 51.75 Jeanne [name] is in the action stage of change   ADVISE: Jeanne [name] was educated on the multiple health risks of obesity as well as the benefit of weight loss to improve her health. She was advised of the need for long term treatment and the importance of lifestyle modifications.  AGREE: Multiple dietary modification options and treatment options were discussed and  Jeanne [name] agreed to the above obesity treatment plan.  Jeanne [name] Durie, am acting as  transcriptionist for Ilene Qua, MD  I have reviewed the above documentation for accuracy and completeness, and I agree with the above. - Ilene Qua, MD

## 2017-11-12 ENCOUNTER — Encounter: Payer: Self-pay | Admitting: Obstetrics & Gynecology

## 2017-11-12 ENCOUNTER — Ambulatory Visit (INDEPENDENT_AMBULATORY_CARE_PROVIDER_SITE_OTHER): Payer: BC Managed Care – PPO | Admitting: Obstetrics & Gynecology

## 2017-11-12 VITALS — BP 113/82 | HR 101 | Ht 62.0 in | Wt 285.1 lb

## 2017-11-12 DIAGNOSIS — Z9889 Other specified postprocedural states: Secondary | ICD-10-CM

## 2017-11-12 NOTE — Progress Notes (Signed)
History:  37 y.o. G0P0000 here today for 2 week post op check. Pt took off her steristrips when she removed her honeycomb dressing. She is off all pain meds. She is voiding and having normal stools.   The following portions of the patient's history were reviewed and updated as appropriate: allergies, current medications, past family history, past medical history, past social history, past surgical history and problem list.  Review of Systems:  Pertinent items are noted in HPI.    Objective:  Physical Exam Blood pressure 113/82, pulse (!) 101, height 5\' 2"  (1.575 m), weight 285 lb 1.6 oz (129.3 kg).  CONSTITUTIONAL: Well-developed, well-nourished female in no acute distress.  HENT:  Normocephalic, atraumatic EYES: Conjunctivae and EOM are normal. No scleral icterus.  NECK: Normal range of motion SKIN: Skin is warm and dry. No rash noted. Not diaphoretic.No pallor. [place]: Alert and oriented to person, place, and time. Normal coordination.  Abd: Soft, nontender and nondistended; incision edges are separated slightly. No erythema or drainage.   Pelvic: not done  Labs and Imaging 10/31/2017 Diagnosis Uterine fibroid(s) - BENIGN UTERINE LEIOMYOMA(S).  Assessment & Plan:  2 week post op check. Pt is doing well  Pt may drive F/u in 5 weeks Gradual return to walking Reviewed post op instructions Reviewed surgery- needs HSG after she heals in about 3-4 months. If tubal blockage, refer to REI All questions answered  Manette Doto L. Harraway-Smith, M.D., [place]lynn June

## 2017-11-12 NOTE — Patient Instructions (Signed)
Myomectomy, Care After °Refer to this sheet in the next few weeks. These instructions provide you with information on caring for yourself after your procedure. Your health care provider may also give you more specific instructions. Your treatment has been planned according to current medical practices, but problems sometimes occur. Call your health care provider if you have any problems or questions after your procedure. °What can I expect after the procedure? °After your procedure, it is typical to have the following: °· Pain in your abdomen, especially at any incision sites. You will be given pain medicine to control the pain. °· Tiredness. This is a normal part of the recovery process. Your energy level will return to normal over the next several weeks. °· Constipation. °· Vaginal bleeding. This is normal and should stop after 1-2 weeks. ° °Follow these instructions at home: °· Only take over-the-counter or prescription medicines as directed by your health care provider. Avoid aspirin because it can cause bleeding. °· Do not douche, use tampons, or have sexual intercourse until given permission by your health care provider. °· Remove or change any bandages (dressings) as directed by your health care provider. °· Take showers instead of baths as directed by your health care provider. °· You will probably be able to go back to your normal routine after a few days. Do not do anything that requires extra effort until your health care provider says it is okay. Do not lift anything heavier than 15 pounds (6.8 kg) until your health care provider approves. °· Walk daily but take frequent rest breaks if you tire easily. °· Continue to practice deep breathing and coughing. If it hurts to cough, try holding a pillow against your belly as you cough. °· If you become constipated, you may: °? Use a mild laxative if your health care provider approves. °? Add more fruit and bran to your diet. °? Drink enough fluids to keep your  urine clear or pale yellow. °· Take your temperature twice a day and write it down. °· Do not drink alcohol. °· Do not drive until your health care provider approves. °· Have someone help you at home for 1 week or until you can do your own household activities. °· Follow up with your health care provider as directed. °Contact a health care provider if: °· You have a fever. °· You have increasing abdominal pain that is not relieved with medicine. °· You have nausea, vomiting, or diarrhea. °· You have pain when you urinate, or you have blood in your urine. °· You have a rash on your body. °· You have pain or redness where your IV access tube was inserted. °· You have redness, swelling, or any kind of drainage from an incision. °Get help right away if: °· You have weakness or lightheadedness. °· You have pain, swelling, or redness in your legs. °· You have chest pain. °· You faint. °· You have shortness of breath. °· You have heavy vaginal bleeding. °· Your incision is opening up. °This information is not intended to replace advice given to you by your health care provider. Make sure you discuss any questions you have with your health care provider. °Document Released: 10/12/2010 Document Revised: 10/28/2015 Document Reviewed: 01/01/2013 °[employer] Interactive Patient Education © 2017 [employer] ° °

## 2017-11-22 ENCOUNTER — Ambulatory Visit (INDEPENDENT_AMBULATORY_CARE_PROVIDER_SITE_OTHER): Payer: BC Managed Care – PPO | Admitting: Family Medicine

## 2017-11-22 VITALS — BP 120/86 | HR 81 | Temp 98.0°F | Ht 62.0 in | Wt 281.0 lb

## 2017-11-22 DIAGNOSIS — Z9189 Other specified personal risk factors, not elsewhere classified: Secondary | ICD-10-CM

## 2017-11-22 DIAGNOSIS — R7303 Prediabetes: Secondary | ICD-10-CM

## 2017-11-22 DIAGNOSIS — E559 Vitamin D deficiency, unspecified: Secondary | ICD-10-CM | POA: Diagnosis not present

## 2017-11-22 DIAGNOSIS — Z6841 Body Mass Index (BMI) 40.0 and over, adult: Secondary | ICD-10-CM

## 2017-11-22 MED ORDER — METFORMIN HCL 500 MG PO TABS: 500.0000 mg | ORAL_TABLET | Freq: Every day | ORAL | 0 refills | Status: DC

## 2017-11-22 MED ORDER — VITAMIN D (ERGOCALCIFEROL) 1.25 MG (50000 UNIT) PO CAPS: 50000.0000 [IU] | ORAL_CAPSULE | ORAL | 0 refills | Status: DC

## 2017-11-22 NOTE — Progress Notes (Signed)
Office: [phone]  /  Fax: [phone]   HPI:   Chief Complaint: OBESITY Jeanne [name] is here to discuss her progress with her obesity treatment plan. She is on the Category 4 plan and is following her eating plan approximately 46 % of the time. She states she is exercising 0 minutes 0 times per week. Jeanne [name] is feeling better after myomectomy. She has increased snacking at night and is alternating between journaling and the Category 4 plan. Her weight is 281 lb (127.5 kg) today and has had a weight loss of 2 pounds over a period of 2 weeks since her last visit. She has lost 16 lbs since starting treatment with [place].  Vitamin D deficiency Jeanne [name] has a diagnosis of vitamin D deficiency. She is currently taking vit D. Jeanne [name] admits fatigue and denies nausea, vomiting or muscle weakness.  Pre-Diabetes Jeanne [name] has a diagnosis of prediabetes based on her elevated Hgb A1c of 5.9 and was informed this puts her at greater risk of developing diabetes. She is taking metformin currently and continues to work on diet and exercise to decrease risk of diabetes. She denies nausea or hypoglycemia.  At risk for diabetes Jeanne [name] is at higher than average risk for developing diabetes due to her obesity and pre-diabetes. She currently denies polyuria or polydipsia.  ALLERGIES: No Known Allergies  MEDICATIONS: Current Outpatient Medications on File Prior to Visit  Medication Sig Dispense Refill  . beclomethasone (QVAR) 40 MCG/ACT inhaler Inhale 2 puffs into the lungs 2 (two) times daily. (Patient taking differently: Inhale 2 puffs into the lungs 2 (two) times daily as needed (for breathing difficulties due to seasonal allergies.). ) 1 Inhaler 3  . calcium carbonate (TUMS - DOSED IN MG ELEMENTAL CALCIUM) 500 MG chewable tablet Chew 1 tablet by mouth 3 (three) times daily as needed for indigestion or heartburn.     . diphenhydrAMINE HCl, Sleep, (ZZZQUIL) 25 MG CAPS Take 25 mg by mouth at bedtime as needed (for  sleep.).    [name] ibuprofen (ADVIL,MOTRIN) 600 MG tablet Take 1 tablet (600 mg total) by mouth every 6 (six) hours as needed (mild pain). 30 tablet 0  . metoprolol succinate (TOPROL-XL) 50 MG 24 hr tablet Take 1 tablet (50 mg total) by mouth daily. Take with or immediately following a meal. 90 tablet 3  . Polyethyl Glycol-Propyl Glycol (LUBRICANT EYE DROPS) 0.4-0.3 % SOLN Place 1 drop into both eyes 3 (three) times daily as needed (for dry/allergy eyes.).    [name] PROAIR HFA 108 (90 Base) MCG/ACT inhaler INHALE 2 PUFFS INTO THE LUNGS EVERY 6 (SIX) HOURS AS NEEDED FOR WHEEZING OR SHORTNESS OF BREATH. 8.5 Inhaler 1  . sertraline (ZOLOFT) 100 MG tablet Take 100 mg by mouth daily.    [name] triamcinolone cream (KENALOG) 0.1 % Apply 1 application topically 2 (two) times daily as needed (for eczema on hands.).     No current facility-administered medications on file prior to visit.     PAST MEDICAL HISTORY: Past Medical History:  Diagnosis Date  . Anxiety   . Asthma   . Dry eyes   . Dry skin   . Eczema   . Fatigue   . Fibroids   . H/O echocardiogram    only showed tachycardia  . Heartburn   . HTN (hypertension)   . Hyperlipidemia   . Palpitations   . Pre-diabetes     pre diabetes, Metformin ordered from weight loss clinic  . Rash in adult   . Shortness of breath  due to allergies  . Stress   . Tachycardia   . Vitamin D deficiency   . Wheezing     PAST SURGICAL HISTORY: Past Surgical History:  Procedure Laterality Date  . EYE SURGERY Bilateral 2006   Lasik  . MYOMECTOMY N/A 10/30/2017   Procedure: ABDOMINAL MYOMECTOMY;  Surgeon: Lavonia Drafts, MD;  Location: [facility];  Service: Gynecology;  Laterality: N/A;    SOCIAL HISTORY: Social History   Tobacco Use  . Smoking status: Never Smoker  . Smokeless tobacco: Never Used  Substance Use Topics  . Alcohol use: No    Alcohol/week: 0.0 oz  . Drug use: No    FAMILY HISTORY: Family History  Problem Relation [name] of Onset  .  Thyroid disease Mother   . Fibroids Mother   . Deep vein thrombosis Mother   . Cancer Maternal Grandmother        liver   . Cancer Maternal Grandfather        pancreatic  . Hyperlipidemia Father   . Hypertension Father   . Sleep apnea Father   . Obesity Father   . Cancer Paternal Grandfather        prostate    ROS: Review of Systems  Constitutional: Positive for malaise/fatigue and weight loss.  Gastrointestinal: Negative for nausea and vomiting.  Genitourinary: Negative for frequency.  Musculoskeletal:       Negative for muscle weakness  Endo/Heme/Allergies: Negative for polydipsia.       Negative for hypoglycemia    PHYSICAL EXAM: Blood pressure 120/86, pulse 81, temperature 98 F (36.7 C), temperature source Oral, height 5\' 2"  (1.575 m), weight 281 lb (127.5 kg), SpO2 96 %. Body mass index is 51.4 kg/m. Physical Exam  Constitutional: She is oriented to person, place, and time. She appears well-developed and well-nourished.  Cardiovascular: Normal rate.  Pulmonary/Chest: Effort normal.  Musculoskeletal: Normal range of motion.  Neurological: She is oriented to person, place, and time.  Skin: Skin is warm and dry.  Psychiatric: She has a normal mood and affect. Her behavior is normal.  Vitals reviewed.   RECENT LABS AND TESTS: BMET    Component Value Date/Time   NA 138 10/17/2017 1548   K 4.1 10/17/2017 1548   CL 107 10/17/2017 1548   CO2 21 (L) 10/17/2017 1548   GLUCOSE 93 10/17/2017 1548   BUN 18 10/17/2017 1548   CREATININE 0.73 10/30/2017 1819   CALCIUM 9.4 10/17/2017 1548   GFRNONAA >60 10/30/2017 1819   GFRAA >60 10/30/2017 1819   Lab Results  Component Value Date   HGBA1C 5.9 (H) 09/10/2017   Lab Results  Component Value Date   INSULIN 12.3 09/10/2017   CBC    Component Value Date/Time   WBC 13.3 (H) 10/31/2017 0516   RBC 4.27 10/31/2017 0516   HGB 13.6 10/31/2017 0516   HCT 40.2 10/31/2017 0516   PLT 268 10/31/2017 0516   MCV 94.1  10/31/2017 0516   MCH 31.9 10/31/2017 0516   MCHC 33.8 10/31/2017 0516   RDW 13.6 10/31/2017 0516   LYMPHSABS 2.4 07/30/2017 1631   MONOABS 0.5 07/30/2017 1631   EOSABS 0.7 07/30/2017 1631   BASOSABS 0.1 07/30/2017 1631   Iron/TIBC/Ferritin/ %Sat No results found for: IRON, TIBC, FERRITIN, IRONPCTSAT Lipid Panel     Component Value Date/Time   CHOL 181 09/10/2017 1058   TRIG 92 09/10/2017 1058   HDL 50 09/10/2017 1058   CHOLHDL 3.6 09/10/2017 1058   CHOLHDL 4 07/03/2017 1609  VLDL 19.6 07/03/2017 1609   LDLCALC 113 (H) 09/10/2017 1058   Hepatic Function Panel     Component Value Date/Time   PROT 7.0 07/30/2017 1631   ALBUMIN 3.9 07/30/2017 1631   AST 14 07/30/2017 1631   ALT 15 07/30/2017 1631   ALKPHOS 79 07/30/2017 1631   BILITOT 0.2 07/30/2017 1631      Component Value Date/Time   TSH 3.340 09/10/2017 1058   TSH 3.55 07/30/2017 1631   TSH 2.28 08/17/2016 1707   Results for PAYSLIE, MCCAIG (MRN [id number]) as of 11/22/2017 11:58  Ref. Range 09/10/2017 10:58  Vitamin D, 25-Hydroxy Latest Ref Range: 30.0 - 100.0 ng/mL 20.3 (L)   ASSESSMENT AND PLAN: Vitamin D deficiency - Plan: Vitamin D, Ergocalciferol, (DRISDOL) 50000 units CAPS capsule  Prediabetes - Plan: metFORMIN (GLUCOPHAGE) 500 MG tablet  At risk for diabetes mellitus  Class 3 severe obesity with serious comorbidity and body mass index (BMI) of 50.0 to 59.9 in adult, unspecified obesity type ([place])  PLAN:  Vitamin D Deficiency Jeanne [name] was informed that low vitamin D levels contributes to fatigue and are associated with obesity, breast, and colon cancer. She agrees to continue to take prescription Vit D @50 ,000 IU every week #4 with no refills and will follow up for routine testing of vitamin D, at least 2-3 times per year. She was informed of the risk of over-replacement of vitamin D and agrees to not increase her dose unless she discusses this with [place] first. Laityn agrees to follow up as  directed.  Pre-Diabetes Jeanne [name] will continue to work on weight loss, exercise, and decreasing simple carbohydrates in her diet to help decrease the risk of diabetes. We dicussed metformin including benefits and risks. She was informed that eating too many simple carbohydrates or too many calories at one sitting increases the likelihood of GI side effects. Jeanne [name] agreed to continue  metformin for now and a prescription was written today for 1 month refill. Jeanne [name] agreed to follow up with [place] as directed to monitor her progress.  Diabetes risk counseling Jeanne [name] was given extended (15 minutes) diabetes prevention counseling today. She is 37 y.o. female and has risk factors for diabetes including obesity and pre-diabetes. We discussed intensive lifestyle modifications today with an emphasis on weight loss as well as increasing exercise and decreasing simple carbohydrates in her diet.  Obesity Jeanne [name] is currently in the action stage of change. As such, her goal is to continue with weight loss efforts She has agreed to keep a food journal with 1700 to 1900 calories and 100+ grams of protein daily Jeanne [name] has been instructed to work up to a goal of 150 minutes of combined cardio and strengthening exercise per week or start exercising for 10 minutes 2 to 3 times per week at the gym for weight loss and overall health benefits. We discussed the following Behavioral Modification Strategies today: increasing lean protein intake, increasing vegetables and work on meal planning and easy cooking plans  Jeanne [name] has agreed to follow up with our clinic in 2 weeks. She was informed of the importance of frequent follow up visits to maximize her success with intensive lifestyle modifications for her multiple health conditions.   OBESITY BEHAVIORAL INTERVENTION VISIT  Today's visit was # 5 out of 22.  Starting weight: 297 lbs Starting date: 09/10/17 Today's weight : 281 lbs  Today's date: 11/22/2017 Total lbs lost to  date: 16 (Patients must lose 7 lbs in the first 6 months to continue with counseling)  ASK: We discussed the diagnosis of obesity with Jeanne [name] today and Jeanne [name] agreed to give [place] permission to discuss obesity behavioral modification therapy today.  ASSESS: Jeanne [name] has the diagnosis of obesity and her BMI today is 51.38 Jeanne [name] is in the action stage of change   ADVISE: Jeanne [name] was educated on the multiple health risks of obesity as well as the benefit of weight loss to improve her health. She was advised of the need for long term treatment and the importance of lifestyle modifications.  AGREE: Multiple dietary modification options and treatment options were discussed and  Jeanne [name] agreed to the above obesity treatment plan.  I, Doreene Nest, am acting as transcriptionist for Eber Jones, MD  I have reviewed the above documentation for accuracy and completeness, and I agree with the above. - Ilene Qua, MD

## 2017-11-23 ENCOUNTER — Encounter: Payer: Self-pay | Admitting: Obstetrics & Gynecology

## 2017-12-05 ENCOUNTER — Encounter: Payer: Self-pay | Admitting: Obstetrics & Gynecology

## 2017-12-10 ENCOUNTER — Ambulatory Visit (INDEPENDENT_AMBULATORY_CARE_PROVIDER_SITE_OTHER): Payer: BC Managed Care – PPO | Admitting: Obstetrics & Gynecology

## 2017-12-10 ENCOUNTER — Encounter: Payer: Self-pay | Admitting: Obstetrics & Gynecology

## 2017-12-10 ENCOUNTER — Ambulatory Visit (INDEPENDENT_AMBULATORY_CARE_PROVIDER_SITE_OTHER): Payer: BC Managed Care – PPO | Admitting: Physician Assistant

## 2017-12-10 VITALS — BP 113/72 | HR 95 | Ht 62.0 in | Wt 288.1 lb

## 2017-12-10 VITALS — BP 120/83 | HR 89 | Temp 98.3°F | Ht 62.0 in | Wt 285.0 lb

## 2017-12-10 DIAGNOSIS — E559 Vitamin D deficiency, unspecified: Secondary | ICD-10-CM | POA: Diagnosis not present

## 2017-12-10 DIAGNOSIS — Z6841 Body Mass Index (BMI) 40.0 and over, adult: Secondary | ICD-10-CM | POA: Diagnosis not present

## 2017-12-10 DIAGNOSIS — Z9889 Other specified postprocedural states: Secondary | ICD-10-CM

## 2017-12-10 NOTE — Progress Notes (Signed)
Office: [phone]  /  Fax: [phone]   HPI:   Chief Complaint: OBESITY Jeanne [name] is here to discuss her progress with her obesity treatment plan. She is on the keep a food journal with 1700 to 1900 calories and 100+ grams of protein daily and is following her eating plan approximately 20 % of the time. She states she is exercising 0 minutes 0 times per week. Jeanne [name] has had an increase in celebration eating and is not motivated to get back on track. She also states she would like to go back to a structured meal plan. Her weight is 285 lb (129.3 kg) today and has had a weight gain of 4 pounds over a period of 2 weeks since her last visit. She has lost 12 lbs since starting treatment with [place].  Vitamin D deficiency Jeanne [name] has a diagnosis of vitamin D deficiency. She is currently taking vit D and denies nausea, vomiting or muscle weakness.  ALLERGIES: No Known Allergies  MEDICATIONS: Current Outpatient Medications on File Prior to Visit  Medication Sig Dispense Refill  . beclomethasone (QVAR) 40 MCG/ACT inhaler Inhale 2 puffs into the lungs 2 (two) times daily. (Patient taking differently: Inhale 2 puffs into the lungs 2 (two) times daily as needed (for breathing difficulties due to seasonal allergies.). ) 1 Inhaler 3  . calcium carbonate (TUMS - DOSED IN MG ELEMENTAL CALCIUM) 500 MG chewable tablet Chew 1 tablet by mouth 3 (three) times daily as needed for indigestion or heartburn.     . diphenhydrAMINE HCl, Sleep, (ZZZQUIL) 25 MG CAPS Take 25 mg by mouth at bedtime as needed (for sleep.).    [name] ibuprofen (ADVIL,MOTRIN) 600 MG tablet Take 1 tablet (600 mg total) by mouth every 6 (six) hours as needed (mild pain). 30 tablet 0  . metFORMIN (GLUCOPHAGE) 500 MG tablet Take 1 tablet (500 mg total) by mouth daily with breakfast. 30 tablet 0  . metoprolol succinate (TOPROL-XL) 50 MG 24 hr tablet Take 1 tablet (50 mg total) by mouth daily. Take with or immediately following a meal. 90 tablet 3  .  Polyethyl Glycol-Propyl Glycol (LUBRICANT EYE DROPS) 0.4-0.3 % SOLN Place 1 drop into both eyes 3 (three) times daily as needed (for dry/allergy eyes.).    [name] PROAIR HFA 108 (90 Base) MCG/ACT inhaler INHALE 2 PUFFS INTO THE LUNGS EVERY 6 (SIX) HOURS AS NEEDED FOR WHEEZING OR SHORTNESS OF BREATH. 8.5 Inhaler 1  . sertraline (ZOLOFT) 100 MG tablet Take 100 mg by mouth daily.    [name] triamcinolone cream (KENALOG) 0.1 % Apply 1 application topically 2 (two) times daily as needed (for eczema on hands.).    [name] Vitamin D, Ergocalciferol, (DRISDOL) 50000 units CAPS capsule Take 1 capsule (50,000 Units total) by mouth every Saturday. 4 capsule 0   No current facility-administered medications on file prior to visit.     PAST MEDICAL HISTORY: Past Medical History:  Diagnosis Date  . Anxiety   . Asthma   . Dry eyes   . Dry skin   . Eczema   . Fatigue   . Fibroids   . H/O echocardiogram    only showed tachycardia  . Heartburn   . HTN (hypertension)   . Hyperlipidemia   . Palpitations   . Pre-diabetes     pre diabetes, Metformin ordered from weight loss clinic  . Rash in adult   . Shortness of breath    due to allergies  . Stress   . Tachycardia   .  Vitamin D deficiency   . Wheezing     PAST SURGICAL HISTORY: Past Surgical History:  Procedure Laterality Date  . EYE SURGERY Bilateral 2006   Lasik  . MYOMECTOMY N/A 10/30/2017   Procedure: ABDOMINAL MYOMECTOMY;  Surgeon: Lavonia Drafts, MD;  Location: [facility];  Service: Gynecology;  Laterality: N/A;    SOCIAL HISTORY: Social History   Tobacco Use  . Smoking status: Never Smoker  . Smokeless tobacco: Never Used  Substance Use Topics  . Alcohol use: No    Alcohol/week: 0.0 oz  . Drug use: No    FAMILY HISTORY: Family History  Problem Relation [name] of Onset  . Thyroid disease Mother   . Fibroids Mother   . Deep vein thrombosis Mother   . Cancer Maternal Grandmother        liver   . Cancer Maternal Grandfather         pancreatic  . Hyperlipidemia Father   . Hypertension Father   . Sleep apnea Father   . Obesity Father   . Cancer Paternal Grandfather        prostate    ROS: Review of Systems  Constitutional: Negative for weight loss.  Gastrointestinal: Negative for nausea and vomiting.  Musculoskeletal:       Negative for muscle weakness    PHYSICAL EXAM: Blood pressure 120/83, pulse 89, temperature 98.3 F (36.8 C), temperature source Oral, height 5\' 2"  (1.575 m), weight 285 lb (129.3 kg), last menstrual period 12/02/2017, SpO2 99 %. Body mass index is 52.13 kg/m. Physical Exam  Constitutional: She is oriented to person, place, and time. She appears well-developed and well-nourished.  Cardiovascular: Normal rate.  Pulmonary/Chest: Effort normal.  Musculoskeletal: Normal range of motion.  Neurological: She is oriented to person, place, and time.  Skin: Skin is warm and dry.  Psychiatric: She has a normal mood and affect. Her behavior is normal.  Vitals reviewed.   RECENT LABS AND TESTS: BMET    Component Value Date/Time   NA 138 10/17/2017 1548   K 4.1 10/17/2017 1548   CL 107 10/17/2017 1548   CO2 21 (L) 10/17/2017 1548   GLUCOSE 93 10/17/2017 1548   BUN 18 10/17/2017 1548   CREATININE 0.73 10/30/2017 1819   CALCIUM 9.4 10/17/2017 1548   GFRNONAA >60 10/30/2017 1819   GFRAA >60 10/30/2017 1819   Lab Results  Component Value Date   HGBA1C 5.9 (H) 09/10/2017   Lab Results  Component Value Date   INSULIN 12.3 09/10/2017   CBC    Component Value Date/Time   WBC 13.3 (H) 10/31/2017 0516   RBC 4.27 10/31/2017 0516   HGB 13.6 10/31/2017 0516   HCT 40.2 10/31/2017 0516   PLT 268 10/31/2017 0516   MCV 94.1 10/31/2017 0516   MCH 31.9 10/31/2017 0516   MCHC 33.8 10/31/2017 0516   RDW 13.6 10/31/2017 0516   LYMPHSABS 2.4 07/30/2017 1631   MONOABS 0.5 07/30/2017 1631   EOSABS 0.7 07/30/2017 1631   BASOSABS 0.1 07/30/2017 1631   Iron/TIBC/Ferritin/ %Sat No results found  for: IRON, TIBC, FERRITIN, IRONPCTSAT Lipid Panel     Component Value Date/Time   CHOL 181 09/10/2017 1058   TRIG 92 09/10/2017 1058   HDL 50 09/10/2017 1058   CHOLHDL 3.6 09/10/2017 1058   CHOLHDL 4 07/03/2017 1609   VLDL 19.6 07/03/2017 1609   LDLCALC 113 (H) 09/10/2017 1058   Hepatic Function Panel     Component Value Date/Time   PROT 7.0 07/30/2017 1631  ALBUMIN 3.9 07/30/2017 1631   AST 14 07/30/2017 1631   ALT 15 07/30/2017 1631   ALKPHOS 79 07/30/2017 1631   BILITOT 0.2 07/30/2017 1631      Component Value Date/Time   TSH 3.340 09/10/2017 1058   TSH 3.55 07/30/2017 1631   TSH 2.28 08/17/2016 1707   Results for Jeanne, CORRENTI (MRN [id number]) as of 12/10/2017 17:17  Ref. Range 09/10/2017 10:58  Vitamin D, 25-Hydroxy Latest Ref Range: 30.0 - 100.0 ng/mL 20.3 (L)   ASSESSMENT AND PLAN: Vitamin D deficiency  Class 3 severe obesity with serious comorbidity and body mass index (BMI) of 50.0 to 59.9 in adult, unspecified obesity type ([place])  PLAN:  Vitamin D Deficiency Jeanne [name] was informed that low vitamin D levels contributes to fatigue and are associated with obesity, breast, and colon cancer. She agrees to continue to take prescription Vit D @50 ,000 IU every week and will follow up for routine testing of vitamin D, at least 2-3 times per year. She was informed of the risk of over-replacement of vitamin D and agrees to not increase her dose unless she discusses this with [place] first.  We spent > than 50% of the 15 minute visit on the counseling as documented in the note.  Obesity Jeanne [name] is currently in the action stage of change. As such, her goal is to get back to weightloss efforts  She has agreed to follow the Category 4 plan Jeanne [name] has been instructed to work up to a goal of 150 minutes of combined cardio and strengthening exercise per week for weight loss and overall health benefits. We discussed the following Behavioral Modification Strategies today: increasing lean  protein intake and emotional eating strategies  Jeanne [name] has agreed to follow up with our clinic in 2 to 3 weeks. She was informed of the importance of frequent follow up visits to maximize her success with intensive lifestyle modifications for her multiple health conditions.    OBESITY BEHAVIORAL INTERVENTION VISIT  Today's visit was # 6 out of 22.  Starting weight: 297 lbs Starting date: 09/10/17 Today's weight : 285 lbs  Today's date: 12/10/2017 Total lbs lost to date: 12 (Patients must lose 7 lbs in the first 6 months to continue with counseling)   ASK: We discussed the diagnosis of obesity with Jeanne [name] today and Jeanne [name] agreed to give [place] permission to discuss obesity behavioral modification therapy today.  ASSESS: Jeanne [name] has the diagnosis of obesity and her BMI today is 52.11 Jeanne [name] is in the action stage of change   ADVISE: Jeanne [name] was educated on the multiple health risks of obesity as well as the benefit of weight loss to improve her health. She was advised of the need for long term treatment and the importance of lifestyle modifications.  AGREE: Multiple dietary modification options and treatment options were discussed and  Jeanne [name] agreed to the above obesity treatment plan.   Corey Skains, am acting as transcriptionist for [employer], PA-C I, Lacy Duverney [facility], have reviewed this note and agree with its content

## 2017-12-10 NOTE — Progress Notes (Signed)
History:  36 y.o. G0P0000 here today for 6 week post op check following myomectomy. Pt reports good pain control.  She had a cycle after the procedure that was very painful. She reports that on that cycle she the bleeding was delayed and was heavy although it was light than prior to the myomectomy.   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      The following portions of the patient's history were reviewed and updated as appropriate: allergies, current medications, past family history, past medical history, past social history, past surgical history and problem list.  Review of Systems:  Pertinent items are noted in HPI.    Objective:  Physical Exam Blood pressure 113/72, pulse 95, height 5\' 2"  (1.575 m), weight 288 lb 1.6 oz (130.7 kg), last menstrual period 12/02/2017.  CONSTITUTIONAL: Well-developed, well-nourished female in no acute distress.  HENT:  Normocephalic, atraumatic EYES: Conjunctivae and EOM are normal. No scleral icterus.  NECK: Normal range of motion SKIN: Skin is warm and dry. No rash noted. Not diaphoretic.No pallor. [place]: Alert and oriented to person, place, and time. Normal coordination.  Abd: obese; Soft, nontender and nondistended. Transverse incision well healed Pelvic: deferred  Labs and Imaging No results found.  Assessment & Plan:  6 weeks post op check- doing well  Keep OCPs  F/u in  9 months or sooner prn  HSG after 9 months  Verlie Hellenbrand L. Harraway-Smith, M.D., Cherlynn June

## 2017-12-11 ENCOUNTER — Encounter: Payer: Self-pay | Admitting: Obstetrics & Gynecology

## 2017-12-14 ENCOUNTER — Encounter: Payer: Self-pay | Admitting: Family Medicine

## 2017-12-17 NOTE — Telephone Encounter (Signed)
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 Pt scheduled for 03/08/18 @ 3pm.

## 2017-12-27 ENCOUNTER — Other Ambulatory Visit (INDEPENDENT_AMBULATORY_CARE_PROVIDER_SITE_OTHER): Payer: Self-pay | Admitting: Family Medicine

## 2017-12-27 DIAGNOSIS — R7303 Prediabetes: Secondary | ICD-10-CM

## 2017-12-27 MED ORDER — METFORMIN HCL 500 MG PO TABS: 500.0000 mg | ORAL_TABLET | Freq: Every day | ORAL | 0 refills | Status: DC

## 2017-12-31 ENCOUNTER — Encounter: Payer: Self-pay | Admitting: Family Medicine

## 2018-01-01 ENCOUNTER — Ambulatory Visit (INDEPENDENT_AMBULATORY_CARE_PROVIDER_SITE_OTHER): Payer: BC Managed Care – PPO | Admitting: Family Medicine

## 2018-01-01 VITALS — BP 125/80 | HR 82 | Temp 98.4°F | Ht 62.0 in | Wt 288.0 lb

## 2018-01-01 DIAGNOSIS — R7303 Prediabetes: Secondary | ICD-10-CM | POA: Diagnosis not present

## 2018-01-01 DIAGNOSIS — Z9189 Other specified personal risk factors, not elsewhere classified: Secondary | ICD-10-CM | POA: Diagnosis not present

## 2018-01-01 DIAGNOSIS — Z6841 Body Mass Index (BMI) 40.0 and over, adult: Secondary | ICD-10-CM

## 2018-01-01 DIAGNOSIS — E559 Vitamin D deficiency, unspecified: Secondary | ICD-10-CM | POA: Diagnosis not present

## 2018-01-01 MED ORDER — VITAMIN D (ERGOCALCIFEROL) 1.25 MG (50000 UNIT) PO CAPS: 50000.0000 [IU] | ORAL_CAPSULE | ORAL | 0 refills | Status: DC

## 2018-01-02 LAB — HEMOGLOBIN A1C
Est. average glucose Bld gHb Est-mCnc: 114 mg/dL
HEMOGLOBIN A1C: 5.6 % (ref 4.8–5.6)

## 2018-01-02 LAB — LIPID PANEL WITH LDL/HDL RATIO
Cholesterol, Total: 226 mg/dL — ABNORMAL HIGH (ref 100–199)
HDL: 56 mg/dL (ref >39)
LDL CALC: 135 mg/dL — AB (ref 0–99)
LDL/HDL RATIO: 2.4 ratio (ref 0.0–3.2)
Triglycerides: 174 mg/dL — ABNORMAL HIGH (ref 0–149)
VLDL Cholesterol Cal: 35 mg/dL (ref 5–40)

## 2018-01-02 LAB — INSULIN, RANDOM: INSULIN: 19.2 u[IU]/mL (ref 2.6–24.9)

## 2018-01-02 LAB — VITAMIN D 25 HYDROXY (VIT D DEFICIENCY, FRACTURES): Vit D, 25-Hydroxy: 21.8 ng/mL — ABNORMAL LOW (ref 30.0–100.0)

## 2018-01-02 NOTE — Progress Notes (Signed)
Office: [phone]  /  Fax: [phone]   HPI:   Chief Complaint: OBESITY Jeanne [name] is here to discuss her progress with her obesity treatment plan. She is on the Category 4 plan and is following her eating plan approximately 36 % of the time. She states she is doing yoga 20 minutes 3 times per week. Jeanne [name] had a busy few weeks. Her husband got a new job and she went to [employer] She finds structure to be difficult to follow, especially during summer. Jeanne [name] has been trying to be mindful of what she eats when she isn't following the plan. Her weight is 288 lb (130.6 kg) today and has had a weight gain of 3 pounds over a period of 3 weeks since her last visit. She has lost 9 lbs since starting treatment with [place].  Vitamin D deficiency Jeanne [name] has a diagnosis of vitamin D deficiency. She is currently taking vit D and admits fatigue, but denies nausea, vomiting or muscle weakness.  Pre-Diabetes Jeanne [name] has a diagnosis of prediabetes based on her elevated Hgb A1c and was informed this puts her at greater risk of developing diabetes. She is taking metformin currently and continues to work on diet and exercise to decrease risk of diabetes. She admits to carb cravings and denies nausea or hypoglycemia.  At risk for diabetes Jeanne [name] is at higher than average risk for developing diabetes due to her obesity and pre-diabetes. She currently denies polyuria or polydipsia.  ALLERGIES: No Known Allergies  MEDICATIONS: Current Outpatient Medications on File Prior to Visit  Medication Sig Dispense Refill  . beclomethasone (QVAR) 40 MCG/ACT inhaler Inhale 2 puffs into the lungs 2 (two) times daily. (Patient taking differently: Inhale 2 puffs into the lungs 2 (two) times daily as needed (for breathing difficulties due to seasonal allergies.). ) 1 Inhaler 3  . calcium carbonate (TUMS - DOSED IN MG ELEMENTAL CALCIUM) 500 MG chewable tablet Chew 1 tablet by mouth 3 (three) times daily as needed for indigestion  or heartburn.     . diphenhydrAMINE HCl, Sleep, (ZZZQUIL) 25 MG CAPS Take 25 mg by mouth at bedtime as needed (for sleep.).    [name] ibuprofen (ADVIL,MOTRIN) 600 MG tablet Take 1 tablet (600 mg total) by mouth every 6 (six) hours as needed (mild pain). 30 tablet 0  . metFORMIN (GLUCOPHAGE) 500 MG tablet Take 1 tablet (500 mg total) by mouth daily with breakfast. 30 tablet 0  . metoprolol succinate (TOPROL-XL) 50 MG 24 hr tablet Take 1 tablet (50 mg total) by mouth daily. Take with or immediately following a meal. 90 tablet 3  . Polyethyl Glycol-Propyl Glycol (LUBRICANT EYE DROPS) 0.4-0.3 % SOLN Place 1 drop into both eyes 3 (three) times daily as needed (for dry/allergy eyes.).    [name] PROAIR HFA 108 (90 Base) MCG/ACT inhaler INHALE 2 PUFFS INTO THE LUNGS EVERY 6 (SIX) HOURS AS NEEDED FOR WHEEZING OR SHORTNESS OF BREATH. 8.5 Inhaler 1  . sertraline (ZOLOFT) 100 MG tablet Take 100 mg by mouth daily.    [name] triamcinolone cream (KENALOG) 0.1 % Apply 1 application topically 2 (two) times daily as needed (for eczema on hands.).     No current facility-administered medications on file prior to visit.     PAST MEDICAL HISTORY: Past Medical History:  Diagnosis Date  . Anxiety   . Asthma   . Dry eyes   . Dry skin   . Eczema   . Fatigue   . Fibroids   . H/O echocardiogram  only showed tachycardia  . Heartburn   . HTN (hypertension)   . Hyperlipidemia   . Palpitations   . Pre-diabetes     pre diabetes, Metformin ordered from weight loss clinic  . Rash in adult   . Shortness of breath    due to allergies  . Stress   . Tachycardia   . Vitamin D deficiency   . Wheezing     PAST SURGICAL HISTORY: Past Surgical History:  Procedure Laterality Date  . EYE SURGERY Bilateral 2006   Lasik  . MYOMECTOMY N/A 10/30/2017   Procedure: ABDOMINAL MYOMECTOMY;  Surgeon: Lavonia Drafts, MD;  Location: [facility];  Service: Gynecology;  Laterality: N/A;    SOCIAL HISTORY: Social History   Tobacco  Use  . Smoking status: Never Smoker  . Smokeless tobacco: Never Used  Substance Use Topics  . Alcohol use: No    Alcohol/week: 0.0 oz  . Drug use: No    FAMILY HISTORY: Family History  Problem Relation [name] of Onset  . Thyroid disease Mother   . Fibroids Mother   . Deep vein thrombosis Mother   . Cancer Maternal Grandmother        liver   . Cancer Maternal Grandfather        pancreatic  . Hyperlipidemia Father   . Hypertension Father   . Sleep apnea Father   . Obesity Father   . Cancer Paternal Grandfather        prostate    ROS: Review of Systems  Constitutional: Positive for malaise/fatigue. Negative for weight loss.  Gastrointestinal: Negative for nausea and vomiting.  Genitourinary: Negative for frequency.  Musculoskeletal:       Negative for muscle weakness  Endo/Heme/Allergies: Negative for polydipsia.       Positive for carb cravings Negative for hypoglycemia    PHYSICAL EXAM: Blood pressure 125/80, pulse 82, temperature 98.4 F (36.9 C), temperature source Oral, height 5\' 2"  (1.575 m), weight 288 lb (130.6 kg), last menstrual period 12/02/2017, SpO2 96 %. Body mass index is 52.68 kg/m. Physical Exam  Constitutional: She is oriented to person, place, and time. She appears well-developed and well-nourished.  Cardiovascular: Normal rate.  Pulmonary/Chest: Effort normal.  Musculoskeletal: Normal range of motion.  Neurological: She is oriented to person, place, and time.  Skin: Skin is warm and dry.  Psychiatric: She has a normal mood and affect. Her behavior is normal.  Vitals reviewed.   RECENT LABS AND TESTS: BMET    Component Value Date/Time   NA 138 10/17/2017 1548   K 4.1 10/17/2017 1548   CL 107 10/17/2017 1548   CO2 21 (L) 10/17/2017 1548   GLUCOSE 93 10/17/2017 1548   BUN 18 10/17/2017 1548   CREATININE 0.73 10/30/2017 1819   CALCIUM 9.4 10/17/2017 1548   GFRNONAA >60 10/30/2017 1819   GFRAA >60 10/30/2017 1819   Lab Results  Component  Value Date   HGBA1C 5.6 01/01/2018   HGBA1C 5.9 (H) 09/10/2017   Lab Results  Component Value Date   INSULIN 19.2 01/01/2018   INSULIN 12.3 09/10/2017   CBC    Component Value Date/Time   WBC 13.3 (H) 10/31/2017 0516   RBC 4.27 10/31/2017 0516   HGB 13.6 10/31/2017 0516   HCT 40.2 10/31/2017 0516   PLT 268 10/31/2017 0516   MCV 94.1 10/31/2017 0516   MCH 31.9 10/31/2017 0516   MCHC 33.8 10/31/2017 0516   RDW 13.6 10/31/2017 0516   LYMPHSABS 2.4 07/30/2017 1631  MONOABS 0.5 07/30/2017 1631   EOSABS 0.7 07/30/2017 1631   BASOSABS 0.1 07/30/2017 1631   Iron/TIBC/Ferritin/ %Sat No results found for: IRON, TIBC, FERRITIN, IRONPCTSAT Lipid Panel     Component Value Date/Time   CHOL 226 (H) 01/01/2018 0822   TRIG 174 (H) 01/01/2018 0822   HDL 56 01/01/2018 0822   CHOLHDL 3.6 09/10/2017 1058   CHOLHDL 4 07/03/2017 1609   VLDL 19.6 07/03/2017 1609   LDLCALC 135 (H) 01/01/2018 0822   Hepatic Function Panel     Component Value Date/Time   PROT 7.0 07/30/2017 1631   ALBUMIN 3.9 07/30/2017 1631   AST 14 07/30/2017 1631   ALT 15 07/30/2017 1631   ALKPHOS 79 07/30/2017 1631   BILITOT 0.2 07/30/2017 1631      Component Value Date/Time   TSH 3.340 09/10/2017 1058   TSH 3.55 07/30/2017 1631   TSH 2.28 08/17/2016 1707   Results for ACACIA, LATORRE (MRN [id number]) as of 01/02/2018 11:24  Ref. Range 01/01/2018 08:22  Vitamin D, 25-Hydroxy Latest Ref Range: 30.0 - 100.0 ng/mL 21.8 (L)   ASSESSMENT AND PLAN: Vitamin D deficiency - Plan: Lipid Panel With LDL/HDL Ratio, VITAMIN D 25 Hydroxy (Vit-D Deficiency, Fractures), Vitamin D, Ergocalciferol, (DRISDOL) 50000 units CAPS capsule  Prediabetes - Plan: Hemoglobin A1c, Insulin, random  At risk for diabetes mellitus  Class 3 severe obesity with serious comorbidity and body mass index (BMI) of 50.0 to 59.9 in adult, unspecified obesity type (Jeanne [name])  PLAN:  Vitamin D Deficiency Jeanne [name] was informed that low vitamin D levels  contributes to fatigue and are associated with obesity, breast, and colon cancer. She agrees to continue to take prescription Vit D @50 ,000 IU every week #4 with no refills and will follow up for routine testing of vitamin D, at least 2-3 times per year. She was informed of the risk of over-replacement of vitamin D and agrees to not increase her dose unless she discusses this with [place] first. We will check vitamin D level today and Jeanne [name] agrees to follow up as directed.  Pre-Diabetes Jeanne [name] will continue to work on weight loss, exercise, and decreasing simple carbohydrates in her diet to help decrease the risk of diabetes. We dicussed metformin including benefits and risks. She was informed that eating too many simple carbohydrates or too many calories at one sitting increases the likelihood of GI side effects. Jeanne [name] will continue metformin for now and a prescription was not written today. We will check Hgb A1c and insulin level today. Jeanne [name] agreed to follow up with [place] as directed to monitor her progress.  Diabetes risk counseling Jeanne [name] was given extended (15 minutes) diabetes prevention counseling today. She is 37 y.o. female and has risk factors for diabetes including obesity and pre-diabetes. We discussed intensive lifestyle modifications today with an emphasis on weight loss as well as increasing exercise and decreasing simple carbohydrates in her diet.  Obesity Jeanne [name] is currently in the action stage of change. As such, her goal is to continue with weight loss efforts She has agreed to keep a food journal with 1800 calories and 100+ grams of protein daily Meika has been instructed to work up to a goal of 150 minutes of combined cardio and strengthening exercise per week for weight loss and overall health benefits. We discussed the following Behavioral Modification Strategies today: keep a strict food journal, better snacking choices, increasing lean protein intake, increasing vegetables and work  on meal planning and easy cooking plans  Karalina has agreed to  follow up with our clinic in 2 weeks. She was informed of the importance of frequent follow up visits to maximize her success with intensive lifestyle modifications for her multiple health conditions.   OBESITY BEHAVIORAL INTERVENTION VISIT  Today's visit was # 7 out of 22.  Starting weight: 297 lbs Starting date: 09/10/17 Today's weight : 288 lbs  Today's date: 01/01/2018 Total lbs lost to date: 9    ASK: We discussed the diagnosis of obesity with Jeanne [name] today and Jeanne [name] agreed to give [place] permission to discuss obesity behavioral modification therapy today.  ASSESS: Jeanne [name] has the diagnosis of obesity and her BMI today is 52.66 Jeanne [name] is in the action stage of change   ADVISE: Jeanne [name] was educated on the multiple health risks of obesity as well as the benefit of weight loss to improve her health. She was advised of the need for long term treatment and the importance of lifestyle modifications.  AGREE: Multiple dietary modification options and treatment options were discussed and  Lashaundra agreed to the above obesity treatment plan.  I, Doreene Nest, am acting as transcriptionist for Eber Jones, MD  I have reviewed the above documentation for accuracy and completeness, and I agree with the above. - Ilene Qua, MD

## 2018-01-09 ENCOUNTER — Encounter: Payer: Self-pay | Admitting: Obstetrics & Gynecology

## 2018-01-15 ENCOUNTER — Ambulatory Visit (INDEPENDENT_AMBULATORY_CARE_PROVIDER_SITE_OTHER): Payer: BC Managed Care – PPO | Admitting: Family Medicine

## 2018-01-15 VITALS — BP 113/81 | HR 84 | Temp 98.2°F | Ht 62.0 in | Wt 286.0 lb

## 2018-01-15 DIAGNOSIS — R7303 Prediabetes: Secondary | ICD-10-CM

## 2018-01-15 DIAGNOSIS — Z9189 Other specified personal risk factors, not elsewhere classified: Secondary | ICD-10-CM | POA: Diagnosis not present

## 2018-01-15 DIAGNOSIS — Z6841 Body Mass Index (BMI) 40.0 and over, adult: Secondary | ICD-10-CM | POA: Diagnosis not present

## 2018-01-15 NOTE — Progress Notes (Signed)
Office: [phone]  /  Fax: [phone]   HPI:   Chief Complaint: OBESITY Jeanne [name] is here to discuss her progress with her obesity treatment plan. She is on the  keep a food journal with 1800 calories and 100+ grams of protein daily and is following her eating plan approximately 50 % of the time. She states she is doing light yoga 25 minutes 3 times per week. Jeanne [name] has done well with weight loss, even while on vacation. She has started yoga. Her weight is 286 lb (129.7 kg) today and has had a weight loss of 2 pounds over a period of 2 weeks since her last visit. She has lost 11 lbs since starting treatment with [place].  Pre-Diabetes Jeanne [name] has a diagnosis of prediabetes based on her elevated Hgb A1c and was informed this puts her at greater risk of developing diabetes. Her A1c improved from 5.9 to 5.6 on diet and metformin. Jeanne [name] continues to work on diet and exercise to decrease risk of diabetes. She denies nausea or hypoglycemia.  At risk for diabetes Jeanne [name] is at higher than average risk for developing diabetes due to her obesity and prediabetes. She currently denies polyuria or polydipsia.  ALLERGIES: No Known Allergies  MEDICATIONS: Current Outpatient Medications on File Prior to Visit  Medication Sig Dispense Refill  . beclomethasone (QVAR) 40 MCG/ACT inhaler Inhale 2 puffs into the lungs 2 (two) times daily. (Patient taking differently: Inhale 2 puffs into the lungs 2 (two) times daily as needed (for breathing difficulties due to seasonal allergies.). ) 1 Inhaler 3  . calcium carbonate (TUMS - DOSED IN MG ELEMENTAL CALCIUM) 500 MG chewable tablet Chew 1 tablet by mouth 3 (three) times daily as needed for indigestion or heartburn.     . diphenhydrAMINE HCl, Sleep, (ZZZQUIL) 25 MG CAPS Take 25 mg by mouth at bedtime as needed (for sleep.).    [name] ibuprofen (ADVIL,MOTRIN) 600 MG tablet Take 1 tablet (600 mg total) by mouth every 6 (six) hours as needed (mild pain). 30 tablet 0  .  metFORMIN (GLUCOPHAGE) 500 MG tablet Take 1 tablet (500 mg total) by mouth daily with breakfast. 30 tablet 0  . metoprolol succinate (TOPROL-XL) 50 MG 24 hr tablet Take 1 tablet (50 mg total) by mouth daily. Take with or immediately following a meal. 90 tablet 3  . Polyethyl Glycol-Propyl Glycol (LUBRICANT EYE DROPS) 0.4-0.3 % SOLN Place 1 drop into both eyes 3 (three) times daily as needed (for dry/allergy eyes.).    [name] PROAIR HFA 108 (90 Base) MCG/ACT inhaler INHALE 2 PUFFS INTO THE LUNGS EVERY 6 (SIX) HOURS AS NEEDED FOR WHEEZING OR SHORTNESS OF BREATH. 8.5 Inhaler 1  . sertraline (ZOLOFT) 100 MG tablet Take 100 mg by mouth daily.    [name] triamcinolone cream (KENALOG) 0.1 % Apply 1 application topically 2 (two) times daily as needed (for eczema on hands.).    [name] Vitamin D, Ergocalciferol, (DRISDOL) 50000 units CAPS capsule Take 1 capsule (50,000 Units total) by mouth every Saturday. 4 capsule 0   No current facility-administered medications on file prior to visit.     PAST MEDICAL HISTORY: Past Medical History:  Diagnosis Date  . Anxiety   . Asthma   . Dry eyes   . Dry skin   . Eczema   . Fatigue   . Fibroids   . H/O echocardiogram    only showed tachycardia  . Heartburn   . HTN (hypertension)   . Hyperlipidemia   . Palpitations   .  Pre-diabetes     pre diabetes, Metformin ordered from weight loss clinic  . Rash in adult   . Shortness of breath    due to allergies  . Stress   . Tachycardia   . Vitamin D deficiency   . Wheezing     PAST SURGICAL HISTORY: Past Surgical History:  Procedure Laterality Date  . EYE SURGERY Bilateral 2006   Lasik  . MYOMECTOMY N/A 10/30/2017   Procedure: ABDOMINAL MYOMECTOMY;  Surgeon: Lavonia Drafts, MD;  Location: [facility];  Service: Gynecology;  Laterality: N/A;    SOCIAL HISTORY: Social History   Tobacco Use  . Smoking status: Never Smoker  . Smokeless tobacco: Never Used  Substance Use Topics  . Alcohol use: No     Alcohol/week: 0.0 standard drinks  . Drug use: No    FAMILY HISTORY: Family History  Problem Relation [name] of Onset  . Thyroid disease Mother   . Fibroids Mother   . Deep vein thrombosis Mother   . Cancer Maternal Grandmother        liver   . Cancer Maternal Grandfather        pancreatic  . Hyperlipidemia Father   . Hypertension Father   . Sleep apnea Father   . Obesity Father   . Cancer Paternal Grandfather        prostate    ROS: Review of Systems  Constitutional: Positive for malaise/fatigue.  Gastrointestinal: Negative for nausea.  Genitourinary: Negative for frequency.  Endo/Heme/Allergies: Negative for polydipsia.       Negative for hypoglycemia    PHYSICAL EXAM: Blood pressure 113/81, pulse 84, temperature 98.2 F (36.8 C), temperature source Oral, height 5\' 2"  (1.575 m), weight 286 lb (129.7 kg), SpO2 96 %. Body mass index is 52.31 kg/m. Physical Exam  Constitutional: She is oriented to person, place, and time. She appears well-developed and well-nourished.  Cardiovascular: Normal rate.  Pulmonary/Chest: Effort normal.  Musculoskeletal: Normal range of motion.  Neurological: She is oriented to person, place, and time.  Skin: Skin is warm and dry.  Psychiatric: She has a normal mood and affect. Her behavior is normal.  Vitals reviewed.   RECENT LABS AND TESTS: BMET    Component Value Date/Time   NA 138 10/17/2017 1548   K 4.1 10/17/2017 1548   CL 107 10/17/2017 1548   CO2 21 (L) 10/17/2017 1548   GLUCOSE 93 10/17/2017 1548   BUN 18 10/17/2017 1548   CREATININE 0.73 10/30/2017 1819   CALCIUM 9.4 10/17/2017 1548   GFRNONAA >60 10/30/2017 1819   GFRAA >60 10/30/2017 1819   Lab Results  Component Value Date   HGBA1C 5.6 01/01/2018   HGBA1C 5.9 (H) 09/10/2017   Lab Results  Component Value Date   INSULIN 19.2 01/01/2018   INSULIN 12.3 09/10/2017   CBC    Component Value Date/Time   WBC 13.3 (H) 10/31/2017 0516   RBC 4.27 10/31/2017 0516    HGB 13.6 10/31/2017 0516   HCT 40.2 10/31/2017 0516   PLT 268 10/31/2017 0516   MCV 94.1 10/31/2017 0516   MCH 31.9 10/31/2017 0516   MCHC 33.8 10/31/2017 0516   RDW 13.6 10/31/2017 0516   LYMPHSABS 2.4 07/30/2017 1631   MONOABS 0.5 07/30/2017 1631   EOSABS 0.7 07/30/2017 1631   BASOSABS 0.1 07/30/2017 1631   Iron/TIBC/Ferritin/ %Sat No results found for: IRON, TIBC, FERRITIN, IRONPCTSAT Lipid Panel     Component Value Date/Time   CHOL 226 (H) 01/01/2018 0822   TRIG  174 (H) 01/01/2018 0822   HDL 56 01/01/2018 0822   CHOLHDL 3.6 09/10/2017 1058   CHOLHDL 4 07/03/2017 1609   VLDL 19.6 07/03/2017 1609   LDLCALC 135 (H) 01/01/2018 0822   Hepatic Function Panel     Component Value Date/Time   PROT 7.0 07/30/2017 1631   ALBUMIN 3.9 07/30/2017 1631   AST 14 07/30/2017 1631   ALT 15 07/30/2017 1631   ALKPHOS 79 07/30/2017 1631   BILITOT 0.2 07/30/2017 1631      Component Value Date/Time   TSH 3.340 09/10/2017 1058   TSH 3.55 07/30/2017 1631   TSH 2.28 08/17/2016 1707   Results for AVIYA, JARVIE (MRN [id number]) as of 01/15/2018 10:56  Ref. Range 01/01/2018 08:22  Vitamin D, 25-Hydroxy Latest Ref Range: 30.0 - 100.0 ng/mL 21.8 (L)   ASSESSMENT AND PLAN: Prediabetes - Plan: metFORMIN (GLUCOPHAGE) 500 MG tablet  At risk for diabetes mellitus  Class 3 severe obesity with serious comorbidity and body mass index (BMI) of 50.0 to 59.9 in adult, unspecified obesity type [facility])  PLAN:  Pre-Diabetes Jeanne [name] will continue to work on weight loss, exercise, and decreasing simple carbohydrates in her diet to help decrease the risk of diabetes. We dicussed metformin including benefits and risks. She was informed that eating too many simple carbohydrates or too many calories at one sitting increases the likelihood of GI side effects. Jeanne [name] requested metformin for now and a prescription was written today for 1 month refill. Jeanne [name] agreed to follow up with [place] as directed to monitor her  progress.  Diabetes risk counseling Jeanne [name] was given extended (15 minutes) diabetes prevention counseling today. She is 37 y.o. female and has risk factors for diabetes including obesity and prediabetes. We discussed intensive lifestyle modifications today with an emphasis on weight loss as well as increasing exercise and decreasing simple carbohydrates in her diet.  Obesity Jeanne [name] is currently in the action stage of change. As such, her goal is to continue with weight loss efforts She has agreed to keep a food journal with 1500 to 1800 calories and 100 grams of protein daily Jeanne [name] has been instructed to work up to a goal of 150 minutes of combined cardio and strengthening exercise per week for weight loss and overall health benefits. We discussed the following Behavioral Modification Strategies today: keep a strict food journal, increasing lean protein intake and decreasing simple carbohydrates   Jeanne [name] has agreed to follow up with our clinic in 2 weeks. She was informed of the importance of frequent follow up visits to maximize her success with intensive lifestyle modifications for her multiple health conditions.   OBESITY BEHAVIORAL INTERVENTION VISIT  Today's visit was # 8 out of 22.  Starting weight: 297 lbs Starting date: 09/10/17 Today's weight : 286 lbs Today's date: 01/15/2018 Total lbs lost to date: 11    ASK: We discussed the diagnosis of obesity with Jeanne [name] today and Jeanne [name] agreed to give [place] permission to discuss obesity behavioral modification therapy today.  ASSESS: Jeanne [name] has the diagnosis of obesity and her BMI today is 52.3 Jeanne [name] is in the action stage of change   ADVISE: Jeanne [name] was educated on the multiple health risks of obesity as well as the benefit of weight loss to improve her health. She was advised of the need for long term treatment and the importance of lifestyle modifications.  AGREE: Multiple dietary modification options and treatment options  were discussed and  Jeanne [name] agreed to the above obesity treatment plan.  I,  Doreene Nest, am acting as transcriptionist for Dennard Nip, MD  I have reviewed the above documentation for accuracy and completeness, and I agree with the above. -Dennard Nip, MD

## 2018-01-17 MED ORDER — METFORMIN HCL 500 MG PO TABS: 500.0000 mg | ORAL_TABLET | Freq: Every day | ORAL | 0 refills | Status: DC

## 2018-01-19 ENCOUNTER — Other Ambulatory Visit (INDEPENDENT_AMBULATORY_CARE_PROVIDER_SITE_OTHER): Payer: Self-pay | Admitting: Family Medicine

## 2018-01-19 DIAGNOSIS — R7303 Prediabetes: Secondary | ICD-10-CM

## 2018-01-31 ENCOUNTER — Ambulatory Visit (INDEPENDENT_AMBULATORY_CARE_PROVIDER_SITE_OTHER): Payer: BC Managed Care – PPO | Admitting: Family Medicine

## 2018-01-31 VITALS — BP 132/82 | HR 83 | Temp 97.6°F | Ht 62.0 in | Wt 290.0 lb

## 2018-01-31 DIAGNOSIS — R7303 Prediabetes: Secondary | ICD-10-CM | POA: Diagnosis not present

## 2018-01-31 DIAGNOSIS — Z9189 Other specified personal risk factors, not elsewhere classified: Secondary | ICD-10-CM | POA: Diagnosis not present

## 2018-01-31 DIAGNOSIS — E559 Vitamin D deficiency, unspecified: Secondary | ICD-10-CM

## 2018-01-31 DIAGNOSIS — Z6841 Body Mass Index (BMI) 40.0 and over, adult: Secondary | ICD-10-CM

## 2018-01-31 MED ORDER — VITAMIN D (ERGOCALCIFEROL) 1.25 MG (50000 UNIT) PO CAPS: 50000.0000 [IU] | ORAL_CAPSULE | ORAL | 0 refills | Status: DC

## 2018-02-05 NOTE — Progress Notes (Signed)
Office: [phone]  /  Fax: [phone]   HPI:   Chief Complaint: OBESITY Jeanne [name] is here to discuss her progress with her obesity treatment plan. She is on the keep a food journal with 1500-1800 calories and 100 grams of protein daily and is following her eating plan approximately 40 % of the time. She states she is doing yoga for 20 minutes 2-3 times per week. Jeanne [name] has been very busy and has had less money for food recently. She is not super motivated to adhere to the plan. She wants to start breaking up lunch and packing lunch. She thinks the weekends will still be hard to follow a structured plan.  Her weight is 290 lb (131.5 kg) today and has gained 4 pounds since her last visit. She has lost 7 lbs since starting treatment with [place].  Vitamin D Deficiency Jeanne [name] has a diagnosis of vitamin D deficiency. She is currently taking prescription Vit D. She notes fatigue and denies nausea, vomiting or muscle weakness.  At risk for osteopenia and osteoporosis Jeanne [name] is at higher risk of osteopenia and osteoporosis due to vitamin D deficiency.   Pre-Diabetes Jeanne [name] has a diagnosis of pre-diabetes based on her elevated Hgb A1c and was informed this puts her at greater risk of developing diabetes. She is taking metformin currently, she notes carbohydrate cravings and denies GI side effects. She continues to work on diet and exercise to Jeanne [name] risk of diabetes. She denies nausea or hypoglycemia.  ALLERGIES: No Known Allergies  MEDICATIONS: Current Outpatient Medications on File Prior to Visit  Medication Sig Dispense Refill  . beclomethasone (QVAR) 40 MCG/ACT inhaler Inhale 2 puffs into the lungs 2 (two) times daily. (Patient taking differently: Inhale 2 puffs into the lungs 2 (two) times daily as needed (for breathing difficulties due to seasonal allergies.). ) 1 Inhaler 3  . calcium carbonate (TUMS - DOSED IN MG ELEMENTAL CALCIUM) 500 MG chewable tablet Chew 1 tablet by mouth 3 (three)  times daily as needed for indigestion or heartburn.     . diphenhydrAMINE HCl, Sleep, (ZZZQUIL) 25 MG CAPS Take 25 mg by mouth at bedtime as needed (for sleep.).    [name] ibuprofen (ADVIL,MOTRIN) 600 MG tablet Take 1 tablet (600 mg total) by mouth every 6 (six) hours as needed (mild pain). 30 tablet 0  . metFORMIN (GLUCOPHAGE) 500 MG tablet Take 1 tablet (500 mg total) by mouth daily with breakfast. 30 tablet 0  . metoprolol succinate (TOPROL-XL) 50 MG 24 hr tablet Take 1 tablet (50 mg total) by mouth daily. Take with or immediately following a meal. 90 tablet 3  . Polyethyl Glycol-Propyl Glycol (LUBRICANT EYE DROPS) 0.4-0.3 % SOLN Place 1 drop into both eyes 3 (three) times daily as needed (for dry/allergy eyes.).    [name] PROAIR HFA 108 (90 Base) MCG/ACT inhaler INHALE 2 PUFFS INTO THE LUNGS EVERY 6 (SIX) HOURS AS NEEDED FOR WHEEZING OR SHORTNESS OF BREATH. 8.5 Inhaler 1  . sertraline (ZOLOFT) 100 MG tablet Take 100 mg by mouth daily.    [name] triamcinolone cream (KENALOG) 0.1 % Apply 1 application topically 2 (two) times daily as needed (for eczema on hands.).     No current facility-administered medications on file prior to visit.     PAST MEDICAL HISTORY: Past Medical History:  Diagnosis Date  . Anxiety   . Asthma   . Dry eyes   . Dry skin   . Eczema   . Fatigue   . Fibroids   .  H/O echocardiogram    only showed tachycardia  . Heartburn   . HTN (hypertension)   . Hyperlipidemia   . Palpitations   . Pre-diabetes     pre diabetes, Metformin ordered from weight loss clinic  . Rash in adult   . Shortness of breath    due to allergies  . Stress   . Tachycardia   . Vitamin D deficiency   . Wheezing     PAST SURGICAL HISTORY: Past Surgical History:  Procedure Laterality Date  . EYE SURGERY Bilateral 2006   Lasik  . MYOMECTOMY N/A 10/30/2017   Procedure: ABDOMINAL MYOMECTOMY;  Surgeon: Lavonia Drafts, MD;  Location: [facility];  Service: Gynecology;  Laterality: N/A;    SOCIAL  HISTORY: Social History   Tobacco Use  . Smoking status: Never Smoker  . Smokeless tobacco: Never Used  Substance Use Topics  . Alcohol use: No    Alcohol/week: 0.0 standard drinks  . Drug use: No    FAMILY HISTORY: Family History  Problem Relation [name] of Onset  . Thyroid disease Mother   . Fibroids Mother   . Deep vein thrombosis Mother   . Cancer Maternal Grandmother        liver   . Cancer Maternal Grandfather        pancreatic  . Hyperlipidemia Father   . Hypertension Father   . Sleep apnea Father   . Obesity Father   . Cancer Paternal Grandfather        prostate    ROS: Review of Systems  Constitutional: Positive for malaise/fatigue. Negative for weight loss.  Gastrointestinal: Negative for nausea and vomiting.  Musculoskeletal:       Negative muscle weakness  Endo/Heme/Allergies:       Negative hypoglycemia    PHYSICAL EXAM: Blood pressure 132/82, pulse 83, temperature 97.6 F (36.4 C), temperature source Oral, height 5\' 2"  (1.575 m), weight 290 lb (131.5 kg), last menstrual period 01/24/2018, SpO2 96 %. Body mass index is 53.04 kg/m. Physical Exam  Constitutional: She is oriented to person, place, and time. She appears well-developed and well-nourished.  Cardiovascular: Normal rate.  Pulmonary/Chest: Effort normal.  Musculoskeletal: Normal range of motion.  Neurological: She is oriented to person, place, and time.  Skin: Skin is warm and dry.  Psychiatric: She has a normal mood and affect. Her behavior is normal.  Vitals reviewed.   RECENT LABS AND TESTS: BMET    Component Value Date/Time   NA 138 10/17/2017 1548   K 4.1 10/17/2017 1548   CL 107 10/17/2017 1548   CO2 21 (L) 10/17/2017 1548   GLUCOSE 93 10/17/2017 1548   BUN 18 10/17/2017 1548   CREATININE 0.73 10/30/2017 1819   CALCIUM 9.4 10/17/2017 1548   GFRNONAA >60 10/30/2017 1819   GFRAA >60 10/30/2017 1819   Lab Results  Component Value Date   HGBA1C 5.6 01/01/2018   HGBA1C 5.9  (H) 09/10/2017   Lab Results  Component Value Date   INSULIN 19.2 01/01/2018   INSULIN 12.3 09/10/2017   CBC    Component Value Date/Time   WBC 13.3 (H) 10/31/2017 0516   RBC 4.27 10/31/2017 0516   HGB 13.6 10/31/2017 0516   HCT 40.2 10/31/2017 0516   PLT 268 10/31/2017 0516   MCV 94.1 10/31/2017 0516   MCH 31.9 10/31/2017 0516   MCHC 33.8 10/31/2017 0516   RDW 13.6 10/31/2017 0516   LYMPHSABS 2.4 07/30/2017 1631   MONOABS 0.5 07/30/2017 1631   EOSABS 0.7  07/30/2017 1631   BASOSABS 0.1 07/30/2017 1631   Iron/TIBC/Ferritin/ %Sat No results found for: IRON, TIBC, FERRITIN, IRONPCTSAT Lipid Panel     Component Value Date/Time   CHOL 226 (H) 01/01/2018 0822   TRIG 174 (H) 01/01/2018 0822   HDL 56 01/01/2018 0822   CHOLHDL 3.6 09/10/2017 1058   CHOLHDL 4 07/03/2017 1609   VLDL 19.6 07/03/2017 1609   LDLCALC 135 (H) 01/01/2018 0822   Hepatic Function Panel     Component Value Date/Time   PROT 7.0 07/30/2017 1631   ALBUMIN 3.9 07/30/2017 1631   AST 14 07/30/2017 1631   ALT 15 07/30/2017 1631   ALKPHOS 79 07/30/2017 1631   BILITOT 0.2 07/30/2017 1631      Component Value Date/Time   TSH 3.340 09/10/2017 1058   TSH 3.55 07/30/2017 1631   TSH 2.28 08/17/2016 1707  Results for TYSHAY, ADEE (MRN [id number]) as of 02/05/2018 11:20  Ref. Range 01/01/2018 08:22  Vitamin D, 25-Hydroxy Latest Ref Range: 30.0 - 100.0 ng/mL 21.8 (L)    ASSESSMENT AND PLAN: Vitamin D deficiency - Plan: Vitamin D, Ergocalciferol, (DRISDOL) 50000 units CAPS capsule  Prediabetes  At risk for osteoporosis  Class 3 severe obesity with serious comorbidity and body mass index (BMI) of 50.0 to 59.9 in adult, unspecified obesity type ([place])  PLAN:  Vitamin D Deficiency Jeanne [name] was informed that low vitamin D levels contributes to fatigue and are associated with obesity, breast, and colon cancer. Jeanne [name] agrees to continue taking prescription Vit D @50 ,000 IU every week #4 and we will refill for 1  month. She will follow up for routine testing of vitamin D, at least 2-3 times per year. She was informed of the risk of over-replacement of vitamin D and agrees to not increase her dose unless she discusses this with [place] first. Aralyn agrees to follow up with our clinic in 2 weeks.  At risk for osteopenia and osteoporosis Jeanne [name] was given extended  (15 minutes) osteoporosis prevention counseling today. Jeanne [name] is at risk for osteopenia and osteoporsis due to her vitamin D deficiency. She was encouraged to take her vitamin D and follow her higher calcium diet and increase strengthening exercise to help strengthen her bones and Jeanne [name] her risk of osteopenia and osteoporosis.  Pre-Diabetes Jeanne [name] will continue to work on weight loss, exercise, and decreasing simple carbohydrates in her diet to help Jeanne [name] the risk of diabetes. We dicussed metformin including benefits and risks. She was informed that eating too many simple carbohydrates or too many calories at one sitting increases the likelihood of GI side effects. Jeanne [name] agrees to continue taking metformin and she agrees to follow up with our clinic in 2 weeks as directed to monitor her progress.  Obesity Jeanne [name] is currently in the action stage of change. As such, her goal is to continue with weight loss efforts She has agreed to keep a food journal with 1750 calories and 100 grams of protein daily or follow the Category 3 plan Jeanne [name] has been instructed to work up to a goal of 150 minutes of combined cardio and strengthening exercise per week for weight loss and overall health benefits. We discussed the following Behavioral Modification Strategies today: increasing lean protein intake, increasing vegetables, work on meal planning and easy cooking plans, and planning for success   Jeanne [name] has agreed to follow up with our clinic in 2 weeks. She was informed of the importance of frequent follow up visits to maximize her success with intensive  lifestyle modifications  for her multiple health conditions.   OBESITY BEHAVIORAL INTERVENTION VISIT  Today's visit was # 9   Starting weight: 297 lbs Starting date: 09/10/17 Today's weight : 290 lbs  Today's date: 01/31/2018 Total lbs lost to date: 7 At least 15 minutes were spent on discussing the following behavioral intervention visit.   ASK: We discussed the diagnosis of obesity with Jeanne [name] today and Jeanne [name] agreed to give [place] permission to discuss obesity behavioral modification therapy today.  ASSESS: Jeanne [name] has the diagnosis of obesity and her BMI today is 53.03 Jeanne [name] is in the action stage of change   ADVISE: Jeanne [name] was educated on the multiple health risks of obesity as well as the benefit of weight loss to improve her health. She was advised of the need for long term treatment and the importance of lifestyle modifications to improve her current health and to Jeanne [name] her risk of future health problems.  AGREE: Multiple dietary modification options and treatment options were discussed and  Jeanne [name] agreed to follow the recommendations documented in the above note.  ARRANGE: Ashleymarie was educated on the importance of frequent visits to treat obesity as outlined per CMS and USPSTF guidelines and agreed to schedule her next follow up appointment today.  I, Trixie Dredge, am acting as transcriptionist for Ilene Qua, MD  I have reviewed the above documentation for accuracy and completeness, and I agree with the above. - Ilene Qua, MD

## 2018-02-18 ENCOUNTER — Ambulatory Visit (INDEPENDENT_AMBULATORY_CARE_PROVIDER_SITE_OTHER): Payer: BC Managed Care – PPO | Admitting: Family Medicine

## 2018-02-18 VITALS — BP 125/83 | HR 86 | Temp 97.5°F | Ht 62.0 in | Wt 288.0 lb

## 2018-02-18 DIAGNOSIS — E8881 Metabolic syndrome: Secondary | ICD-10-CM

## 2018-02-18 DIAGNOSIS — E559 Vitamin D deficiency, unspecified: Secondary | ICD-10-CM

## 2018-02-18 DIAGNOSIS — Z9189 Other specified personal risk factors, not elsewhere classified: Secondary | ICD-10-CM | POA: Diagnosis not present

## 2018-02-18 DIAGNOSIS — R7303 Prediabetes: Secondary | ICD-10-CM

## 2018-02-18 DIAGNOSIS — Z6841 Body Mass Index (BMI) 40.0 and over, adult: Secondary | ICD-10-CM

## 2018-02-18 MED ORDER — METFORMIN HCL 500 MG PO TABS: 500.0000 mg | ORAL_TABLET | Freq: Every day | ORAL | 0 refills | Status: DC

## 2018-02-20 ENCOUNTER — Telehealth: Payer: Self-pay | Admitting: Family Medicine

## 2018-02-20 NOTE — Telephone Encounter (Signed)
Copied from [place] [id number]. Topic: Quick Communication - See Telephone Encounter >> Feb 20, 2018  4:35 PM Genella Rife H wrote: CRM for notification. See Telephone encounter for: 02/20/18.  Left voicemail appt needs to be rescheduled

## 2018-02-20 NOTE — Progress Notes (Signed)
Office: [phone]  /  Fax: [phone]   HPI:   Chief Complaint: OBESITY Jeanne [name] is here to discuss her progress with her obesity treatment plan. She is on the keep a food journal with 1750 calories and 100 grams of protein daily or the Category 3 plan and is following her eating plan approximately 50 % of the time. She states she is exercising 0 minutes 0 times per week. 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 She is sleeping slightly more. Jeanne [name] is trying to get back on track and she is working on planning ahead. Her weight is 288 lb (130.6 kg) today and has had a weight loss of 2 pounds over a period of 2 weeks since her last visit. She has lost 9 lbs since starting treatment with [place].  Pre-Diabetes Jeanne [name] has a diagnosis of prediabetes based on her elevated Hgb A1c and was informed this puts her at greater risk of developing diabetes. She has no GI upset with metformin. Jeanne [name] continues to work on diet and exercise to decrease risk of diabetes. Jeanne [name] admits to occasional carb cravings and she denies nausea or hypoglycemia.  At risk for diabetes Jeanne [name] is at higher than average risk for developing diabetes due to her obesity and prediabetes. She currently denies polyuria or polydipsia.  Vitamin D deficiency Jeanne [name] has a diagnosis of vitamin D deficiency. Jeanne [name] is currently taking vit D and her fatigue is improving. She denies nausea, vomiting or muscle weakness.  ALLERGIES: No Known Allergies  MEDICATIONS: Current Outpatient Medications on File Prior to Visit  Medication Sig Dispense Refill  . beclomethasone (QVAR) 40 MCG/ACT inhaler Inhale 2 puffs into the lungs 2 (two) times daily. (Patient taking differently: Inhale 2 puffs into the lungs 2 (two) times daily as needed (for breathing difficulties due to seasonal allergies.). ) 1 Inhaler 3  . calcium carbonate (TUMS - DOSED IN MG ELEMENTAL CALCIUM) 500 MG chewable tablet Chew 1 tablet by mouth 3 (three)  times daily as needed for indigestion or heartburn.     . diphenhydrAMINE HCl, Sleep, (ZZZQUIL) 25 MG CAPS Take 25 mg by mouth at bedtime as needed (for sleep.).    [name] ibuprofen (ADVIL,MOTRIN) 600 MG tablet Take 1 tablet (600 mg total) by mouth every 6 (six) hours as needed (mild pain). 30 tablet 0  . metoprolol succinate (TOPROL-XL) 50 MG 24 hr tablet Take 1 tablet (50 mg total) by mouth daily. Take with or immediately following a meal. 90 tablet 3  . Polyethyl Glycol-Propyl Glycol (LUBRICANT EYE DROPS) 0.4-0.3 % SOLN Place 1 drop into both eyes 3 (three) times daily as needed (for dry/allergy eyes.).    [name] PROAIR HFA 108 (90 Base) MCG/ACT inhaler INHALE 2 PUFFS INTO THE LUNGS EVERY 6 (SIX) HOURS AS NEEDED FOR WHEEZING OR SHORTNESS OF BREATH. 8.5 Inhaler 1  . sertraline (ZOLOFT) 100 MG tablet Take 100 mg by mouth daily.    [name] triamcinolone cream (KENALOG) 0.1 % Apply 1 application topically 2 (two) times daily as needed (for eczema on hands.).    [name] Vitamin D, Ergocalciferol, (DRISDOL) 50000 units CAPS capsule Take 1 capsule (50,000 Units total) by mouth every Saturday. 4 capsule 0   No current facility-administered medications on file prior to visit.     PAST MEDICAL HISTORY: Past Medical History:  Diagnosis Date  . Anxiety   . Asthma   . Dry eyes   . Dry skin   . Eczema   . Fatigue   .  Fibroids   . H/O echocardiogram    only showed tachycardia  . Heartburn   . HTN (hypertension)   . Hyperlipidemia   . Palpitations   . Pre-diabetes     pre diabetes, Metformin ordered from weight loss clinic  . Rash in adult   . Shortness of breath    due to allergies  . Stress   . Tachycardia   . Vitamin D deficiency   . Wheezing     PAST SURGICAL HISTORY: Past Surgical History:  Procedure Laterality Date  . EYE SURGERY Bilateral 2006   Lasik  . MYOMECTOMY N/A 10/30/2017   Procedure: ABDOMINAL MYOMECTOMY;  Surgeon: Lavonia Drafts, MD;  Location: [facility];  Service: Gynecology;   Laterality: N/A;    SOCIAL HISTORY: Social History   Tobacco Use  . Smoking status: Never Smoker  . Smokeless tobacco: Never Used  Substance Use Topics  . Alcohol use: No    Alcohol/week: 0.0 standard drinks  . Drug use: No    FAMILY HISTORY: Family History  Problem Relation Age of Onset  . Thyroid disease Mother   . Fibroids Mother   . Deep vein thrombosis Mother   . Cancer Maternal Grandmother        liver   . Cancer Maternal Grandfather        pancreatic  . Hyperlipidemia Father   . Hypertension Father   . Sleep apnea Father   . Obesity Father   . Cancer Paternal Grandfather        prostate    ROS: Review of Systems  Constitutional: Positive for malaise/fatigue and weight loss.  Gastrointestinal: Negative for diarrhea, nausea and vomiting.  Genitourinary: Negative for frequency.  Musculoskeletal:       Negative for muscle weakness  Endo/Heme/Allergies: Negative for polydipsia.       Positive for carb cravings Negative for hypoglycemia    PHYSICAL EXAM: Blood pressure 125/83, pulse 86, temperature (!) 97.5 F (36.4 C), temperature source Oral, height 5\' 2"  (1.575 m), weight 288 lb (130.6 kg), last menstrual period 01/17/2018, SpO2 95 %. Body mass index is 52.68 kg/m. Physical Exam  Constitutional: She is oriented to person, place, and time. She appears well-developed and well-nourished.  Cardiovascular: Normal rate.  Pulmonary/Chest: Effort normal.  Musculoskeletal: Normal range of motion.  Neurological: She is oriented to person, place, and time.  Skin: Skin is warm and dry.  Psychiatric: She has a normal mood and affect. Her behavior is normal.  Vitals reviewed.   RECENT LABS AND TESTS: BMET    Component Value Date/Time   NA 138 10/17/2017 1548   K 4.1 10/17/2017 1548   CL 107 10/17/2017 1548   CO2 21 (L) 10/17/2017 1548   GLUCOSE 93 10/17/2017 1548   BUN 18 10/17/2017 1548   CREATININE 0.73 10/30/2017 1819   CALCIUM 9.4 10/17/2017 1548    GFRNONAA >60 10/30/2017 1819   GFRAA >60 10/30/2017 1819   Lab Results  Component Value Date   HGBA1C 5.6 01/01/2018   HGBA1C 5.9 (H) 09/10/2017   Lab Results  Component Value Date   INSULIN 19.2 01/01/2018   INSULIN 12.3 09/10/2017   CBC    Component Value Date/Time   WBC 13.3 (H) 10/31/2017 0516   RBC 4.27 10/31/2017 0516   HGB 13.6 10/31/2017 0516   HCT 40.2 10/31/2017 0516   PLT 268 10/31/2017 0516   MCV 94.1 10/31/2017 0516   MCH 31.9 10/31/2017 0516   MCHC 33.8 10/31/2017 0516   RDW  13.6 10/31/2017 0516   LYMPHSABS 2.4 07/30/2017 1631   MONOABS 0.5 07/30/2017 1631   EOSABS 0.7 07/30/2017 1631   BASOSABS 0.1 07/30/2017 1631   Iron/TIBC/Ferritin/ %Sat No results found for: IRON, TIBC, FERRITIN, IRONPCTSAT Lipid Panel     Component Value Date/Time   CHOL 226 (H) 01/01/2018 0822   TRIG 174 (H) 01/01/2018 0822   HDL 56 01/01/2018 0822   CHOLHDL 3.6 09/10/2017 1058   CHOLHDL 4 07/03/2017 1609   VLDL 19.6 07/03/2017 1609   LDLCALC 135 (H) 01/01/2018 0822   Hepatic Function Panel     Component Value Date/Time   PROT 7.0 07/30/2017 1631   ALBUMIN 3.9 07/30/2017 1631   AST 14 07/30/2017 1631   ALT 15 07/30/2017 1631   ALKPHOS 79 07/30/2017 1631   BILITOT 0.2 07/30/2017 1631      Component Value Date/Time   TSH 3.340 09/10/2017 1058   TSH 3.55 07/30/2017 1631   TSH 2.28 08/17/2016 1707   Results for TAHJANAE, BLANKENBURG (MRN [id number]) as of 02/20/2018 10:00  Ref. Range 01/01/2018 08:22  Vitamin D, 25-Hydroxy Latest Ref Range: 30.0 - 100.0 ng/mL 21.8 (L)   ASSESSMENT AND PLAN: Insulin resistance  Vitamin D deficiency  At risk for diabetes mellitus  Class 3 severe obesity with serious comorbidity and body mass index (BMI) of 50.0 to 59.9 in adult, unspecified obesity type ([place])  Prediabetes - Plan: metFORMIN (GLUCOPHAGE) 500 MG tablet  PLAN:  Pre-Diabetes Beva will continue to work on weight loss, exercise, and decreasing simple carbohydrates in her  diet to help decrease the risk of diabetes. We dicussed metformin including benefits and risks. She was informed that eating too many simple carbohydrates or too many calories at one sitting increases the likelihood of GI side effects. Topacio agrees to continue metformin 500 mg qd #90 with no refills and follow up with [place] as directed to monitor her progress.     Diabetes risk counseling Lyndal was given extended (15 minutes) diabetes prevention counseling today. She is 37 y.o. female and has risk factors for diabetes including obesity and prediabetes. We discussed intensive lifestyle modifications today with an emphasis on weight loss as well as increasing exercise and decreasing simple carbohydrates in her diet.  Vitamin D Deficiency Dezirea was informed that low vitamin D levels contributes to fatigue and are associated with obesity, breast, and colon cancer. She agrees to continue to take prescription Vit D @50 ,000 IU every week (no refill needed) and will follow up for routine testing of vitamin D, at least 2-3 times per year. She was informed of the risk of over-replacement of vitamin D and agrees to not increase her dose unless she discusses this with [place] first.  Obesity Jessicca is currently in the action stage of change. As such, her goal is to continue with weight loss efforts She has agreed to follow the Category 1 plan  Aishah is working to stay seventy five percent in adherence to her meal plan. Suhayla has been instructed to work up to a goal of 150 minutes of combined cardio and strengthening exercise per week for weight loss and overall health benefits. We discussed the following Behavioral Modification Strategies today: planning for success, increasing lean protein intake, increasing vegetables and work on meal planning and easy cooking plans  Tomie has agreed to follow up with our clinic in 2 weeks. She was informed of the importance of frequent follow up visits to maximize her success  with intensive lifestyle modifications for her multiple  health conditions.   OBESITY BEHAVIORAL INTERVENTION VISIT  Today's visit was # 10   Starting weight: 297 lbs Starting date: 09/10/17 Today's weight : 288 lbs  Today's date: 02/18/2018 Total lbs lost to date: 9   ASK: We discussed the diagnosis of obesity with [name] today and Kortnie agreed to give [place] permission to discuss obesity behavioral modification therapy today.  ASSESS: Rayvn has the diagnosis of obesity and her BMI today is 52.66 Erion is in the action stage of change   ADVISE: Joell was educated on the multiple health risks of obesity as well as the benefit of weight loss to improve her health. She was advised of the need for long term treatment and the importance of lifestyle modifications to improve her current health and to decrease her risk of future health problems.  AGREE: Multiple dietary modification options and treatment options were discussed and  Kenise agreed to follow the recommendations documented in the above note.  ARRANGE: Jodeen was educated on the importance of frequent visits to treat obesity as outlined per CMS and USPSTF guidelines and agreed to schedule her next follow up appointment today.  I, Doreene Nest, am acting as transcriptionist for Eber Jones, MD  I have reviewed the above documentation for accuracy and completeness, and I agree with the above. - Ilene Qua, MD

## 2018-02-25 ENCOUNTER — Other Ambulatory Visit (INDEPENDENT_AMBULATORY_CARE_PROVIDER_SITE_OTHER): Payer: Self-pay | Admitting: Family Medicine

## 2018-02-25 DIAGNOSIS — R7303 Prediabetes: Secondary | ICD-10-CM

## 2018-03-06 ENCOUNTER — Ambulatory Visit (INDEPENDENT_AMBULATORY_CARE_PROVIDER_SITE_OTHER): Payer: BC Managed Care – PPO | Admitting: Family Medicine

## 2018-03-08 ENCOUNTER — Encounter: Payer: BC Managed Care – PPO | Admitting: Family Medicine

## 2018-03-21 NOTE — Progress Notes (Signed)
Office: [phone]  /  Fax: [phone]   HPI:   Chief Complaint: OBESITY Jeanne [name] is here to discuss her progress with her obesity treatment plan. She is on the Category 4 plan and is following her eating plan approximately 50 % of the time. She states she is exercising 0 minutes 0 times per week. 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 She is sleeping slightly more. Jeanne [name] is trying to get back on track and she is working on planning ahead. Her weight is 288 lb (130.6 kg) today and has had a weight loss of 2 pounds over a period of 2 weeks since her last visit. She has lost 9 lbs since starting treatment with [place].  Pre-Diabetes Jeanne [name] has a diagnosis of prediabetes based on her elevated Hgb A1c and was informed this puts her at greater risk of developing diabetes. She has no GI upset with metformin. Jeanne [name] continues to work on diet and exercise to decrease risk of diabetes. She admits to occasional carb cravings and denies hypoglycemia.  At risk for diabetes Jeanne [name] is at higher than average risk for developing diabetes due to her obesity and prediabetes. She currently denies polyuria or polydipsia.  Vitamin D deficiency Jeanne [name] has a diagnosis of vitamin D deficiency. Jeanne [name] is currently taking vit D and her fatigue is improving and she  denies nausea, vomiting or muscle weakness.  ALLERGIES: No Known Allergies  MEDICATIONS: Current Outpatient Medications on File Prior to Visit  Medication Sig Dispense Refill  . beclomethasone (QVAR) 40 MCG/ACT inhaler Inhale 2 puffs into the lungs 2 (two) times daily. (Patient taking differently: Inhale 2 puffs into the lungs 2 (two) times daily as needed (for breathing difficulties due to seasonal allergies.). ) 1 Inhaler 3  . calcium carbonate (TUMS - DOSED IN MG ELEMENTAL CALCIUM) 500 MG chewable tablet Chew 1 tablet by mouth 3 (three) times daily as needed for indigestion or heartburn.     . diphenhydrAMINE HCl, Sleep, (ZZZQUIL)  25 MG CAPS Take 25 mg by mouth at bedtime as needed (for sleep.).    [name] ibuprofen (ADVIL,MOTRIN) 600 MG tablet Take 1 tablet (600 mg total) by mouth every 6 (six) hours as needed (mild pain). 30 tablet 0  . metoprolol succinate (TOPROL-XL) 50 MG 24 hr tablet Take 1 tablet (50 mg total) by mouth daily. Take with or immediately following a meal. 90 tablet 3  . Polyethyl Glycol-Propyl Glycol (LUBRICANT EYE DROPS) 0.4-0.3 % SOLN Place 1 drop into both eyes 3 (three) times daily as needed (for dry/allergy eyes.).    [name] PROAIR HFA 108 (90 Base) MCG/ACT inhaler INHALE 2 PUFFS INTO THE LUNGS EVERY 6 (SIX) HOURS AS NEEDED FOR WHEEZING OR SHORTNESS OF BREATH. 8.5 Inhaler 1  . sertraline (ZOLOFT) 100 MG tablet Take 100 mg by mouth daily.    [name] triamcinolone cream (KENALOG) 0.1 % Apply 1 application topically 2 (two) times daily as needed (for eczema on hands.).    [name] Vitamin D, Ergocalciferol, (DRISDOL) 50000 units CAPS capsule Take 1 capsule (50,000 Units total) by mouth every Saturday. 4 capsule 0   No current facility-administered medications on file prior to visit.     PAST MEDICAL HISTORY: Past Medical History:  Diagnosis Date  . Anxiety   . Asthma   . Dry eyes   . Dry skin   . Eczema   . Fatigue   . Fibroids   . H/O echocardiogram    only showed tachycardia  . Heartburn   .  HTN (hypertension)   . Hyperlipidemia   . Palpitations   . Pre-diabetes     pre diabetes, Metformin ordered from weight loss clinic  . Rash in adult   . Shortness of breath    due to allergies  . Stress   . Tachycardia   . Vitamin D deficiency   . Wheezing     PAST SURGICAL HISTORY: Past Surgical History:  Procedure Laterality Date  . EYE SURGERY Bilateral 2006   Lasik  . MYOMECTOMY N/A 10/30/2017   Procedure: ABDOMINAL MYOMECTOMY;  Surgeon: Lavonia Drafts, MD;  Location: [facility];  Service: Gynecology;  Laterality: N/A;    SOCIAL HISTORY: Social History   Tobacco Use  . Smoking status: Never  Smoker  . Smokeless tobacco: Never Used  Substance Use Topics  . Alcohol use: No    Alcohol/week: 0.0 standard drinks  . Drug use: No    FAMILY HISTORY: Family History  Problem Relation Age of Onset  . Thyroid disease Mother   . Fibroids Mother   . Deep vein thrombosis Mother   . Cancer Maternal Grandmother        liver   . Cancer Maternal Grandfather        pancreatic  . Hyperlipidemia Father   . Hypertension Father   . Sleep apnea Father   . Obesity Father   . Cancer Paternal Grandfather        prostate    ROS: Review of Systems  Constitutional: Positive for malaise/fatigue and weight loss.  Gastrointestinal: Negative for diarrhea, nausea and vomiting.  Genitourinary: Negative for frequency.  Musculoskeletal:       Negative for muscle weakness  Endo/Heme/Allergies: Negative for polydipsia.       Positive for carb cravings Negative for hypoglycemia    PHYSICAL EXAM: Blood pressure 125/83, pulse 86, temperature (!) 97.5 F (36.4 C), temperature source Oral, height 5\' 2"  (1.575 m), weight 288 lb (130.6 kg), last menstrual period 01/17/2018, SpO2 95 %. Body mass index is 52.68 kg/m. Physical Exam  Constitutional: She is oriented to person, place, and time. She appears well-developed and well-nourished.  Cardiovascular: Normal rate.  Pulmonary/Chest: Effort normal.  Musculoskeletal: Normal range of motion.  Neurological: She is oriented to person, place, and time.  Skin: Skin is warm and dry.  Psychiatric: She has a normal mood and affect. Her behavior is normal.  Vitals reviewed.   RECENT LABS AND TESTS: BMET    Component Value Date/Time   NA 138 10/17/2017 1548   K 4.1 10/17/2017 1548   CL 107 10/17/2017 1548   CO2 21 (L) 10/17/2017 1548   GLUCOSE 93 10/17/2017 1548   BUN 18 10/17/2017 1548   CREATININE 0.73 10/30/2017 1819   CALCIUM 9.4 10/17/2017 1548   GFRNONAA >60 10/30/2017 1819   GFRAA >60 10/30/2017 1819   Lab Results  Component Value Date     HGBA1C 5.6 01/01/2018   HGBA1C 5.9 (H) 09/10/2017   Lab Results  Component Value Date   INSULIN 19.2 01/01/2018   INSULIN 12.3 09/10/2017   CBC    Component Value Date/Time   WBC 13.3 (H) 10/31/2017 0516   RBC 4.27 10/31/2017 0516   HGB 13.6 10/31/2017 0516   HCT 40.2 10/31/2017 0516   PLT 268 10/31/2017 0516   MCV 94.1 10/31/2017 0516   MCH 31.9 10/31/2017 0516   MCHC 33.8 10/31/2017 0516   RDW 13.6 10/31/2017 0516   LYMPHSABS 2.4 07/30/2017 1631   MONOABS 0.5 07/30/2017 1631  EOSABS 0.7 07/30/2017 1631   BASOSABS 0.1 07/30/2017 1631   Iron/TIBC/Ferritin/ %Sat No results found for: IRON, TIBC, FERRITIN, IRONPCTSAT Lipid Panel     Component Value Date/Time   CHOL 226 (H) 01/01/2018 0822   TRIG 174 (H) 01/01/2018 0822   HDL 56 01/01/2018 0822   CHOLHDL 3.6 09/10/2017 1058   CHOLHDL 4 07/03/2017 1609   VLDL 19.6 07/03/2017 1609   LDLCALC 135 (H) 01/01/2018 0822   Hepatic Function Panel     Component Value Date/Time   PROT 7.0 07/30/2017 1631   ALBUMIN 3.9 07/30/2017 1631   AST 14 07/30/2017 1631   ALT 15 07/30/2017 1631   ALKPHOS 79 07/30/2017 1631   BILITOT 0.2 07/30/2017 1631      Component Value Date/Time   TSH 3.340 09/10/2017 1058   TSH 3.55 07/30/2017 1631   TSH 2.28 08/17/2016 1707   Results for PERINA, SALVAGGIO (MRN [id number]) as of 03/21/2018 11:36  Ref. Range 01/01/2018 08:22  Vitamin D, 25-Hydroxy Latest Ref Range: 30.0 - 100.0 ng/mL 21.8 (L)   ASSESSMENT AND PLAN: Insulin resistance  Vitamin D deficiency  At risk for diabetes mellitus  Class 3 severe obesity with serious comorbidity and body mass index (BMI) of 50.0 to 59.9 in adult, unspecified obesity type ([place])  Prediabetes - Plan: metFORMIN (GLUCOPHAGE) 500 MG tablet  PLAN:  Pre-Diabetes Ahmya will continue to work on weight loss, exercise, and decreasing simple carbohydrates in her diet to help decrease the risk of diabetes. We dicussed metformin including benefits and risks.  She was informed that eating too many simple carbohydrates or too many calories at one sitting increases the likelihood of GI side effects. Latrelle agrees to continue metformin 500 mg daily #90 with no refills and follow up with [place] as directed to monitor her progress.  Diabetes risk counseling Kynzli was given extended (15 minutes) diabetes prevention counseling today. She is 37 y.o. female and has risk factors for diabetes including obesity and prediabetes. We discussed intensive lifestyle modifications today with an emphasis on weight loss as well as increasing exercise and decreasing simple carbohydrates in her diet.  Vitamin D Deficiency Anushri was informed that low vitamin D levels contributes to fatigue and are associated with obesity, breast, and colon cancer. She agrees to continue to take prescription Vit D @50 ,000 IU every week (no refill needed) and will follow up for routine testing of vitamin D, at least 2-3 times per year. She was informed of the risk of over-replacement of vitamin D and agrees to not increase her dose unless she discusses this with [place] first.  Obesity Annastasia is currently in the action stage of change. As such, her goal is to continue with weight loss efforts She has agreed to follow the Category 4 plan Eulalia has been instructed to work up to a goal of 150 minutes of combined cardio and strengthening exercise per week for weight loss and overall health benefits. We discussed the following Behavioral Modification Strategies today: planning for success, increasing lean protein intake, increasing vegetables and work on meal planning and easy cooking plans  Ellory is working to stay 75% in adherence to her Category 4 meal plan.  Taccara has agreed to follow up with our clinic in 2 weeks. She was informed of the importance of frequent follow up visits to maximize her success with intensive lifestyle modifications for her multiple health conditions.   OBESITY BEHAVIORAL  INTERVENTION VISIT  Today's visit was # 10   Starting weight: 297 lbs  Starting date: 09/10/17 Today's weight : 288 lbs Today's date: 02/18/18 Total lbs lost to date: 9   ASK: We discussed the diagnosis of obesity with [name] today and Hillary agreed to give [place] permission to discuss obesity behavioral modification therapy today.  ASSESS: Tenae has the diagnosis of obesity and her BMI today is 52.66 Donica is in the action stage of change   ADVISE: Stella was educated on the multiple health risks of obesity as well as the benefit of weight loss to improve her health. She was advised of the need for long term treatment and the importance of lifestyle modifications to improve her current health and to decrease her risk of future health problems.  AGREE: Multiple dietary modification options and treatment options were discussed and  Annaly agreed to follow the recommendations documented in the above note.  ARRANGE: Aleza was educated on the importance of frequent visits to treat obesity as outlined per CMS and USPSTF guidelines and agreed to schedule her next follow up appointment today.  I, Doreene Nest, am acting as transcriptionist for Eber Jones, MD  I have reviewed the above documentation for accuracy and completeness, and I agree with the above. - Ilene Qua, MD

## 2018-03-25 ENCOUNTER — Ambulatory Visit (INDEPENDENT_AMBULATORY_CARE_PROVIDER_SITE_OTHER): Payer: BC Managed Care – PPO | Admitting: Family Medicine

## 2018-03-25 VITALS — BP 133/92 | HR 98 | Temp 97.7°F | Ht 62.0 in | Wt 287.0 lb

## 2018-03-25 DIAGNOSIS — Z9189 Other specified personal risk factors, not elsewhere classified: Secondary | ICD-10-CM

## 2018-03-25 DIAGNOSIS — E8881 Metabolic syndrome: Secondary | ICD-10-CM | POA: Diagnosis not present

## 2018-03-25 DIAGNOSIS — E559 Vitamin D deficiency, unspecified: Secondary | ICD-10-CM | POA: Diagnosis not present

## 2018-03-25 DIAGNOSIS — Z6841 Body Mass Index (BMI) 40.0 and over, adult: Secondary | ICD-10-CM

## 2018-03-26 ENCOUNTER — Telehealth: Payer: Self-pay | Admitting: *Deleted

## 2018-03-26 ENCOUNTER — Ambulatory Visit: Payer: BC Managed Care – PPO | Admitting: Family Medicine

## 2018-03-26 ENCOUNTER — Encounter: Payer: Self-pay | Admitting: *Deleted

## 2018-03-26 ENCOUNTER — Encounter: Payer: Self-pay | Admitting: Family Medicine

## 2018-03-26 VITALS — BP 110/80 | HR 102 | Temp 98.0°F | Resp 16 | Ht 62.0 in | Wt 295.4 lb

## 2018-03-26 DIAGNOSIS — J4520 Mild intermittent asthma, uncomplicated: Secondary | ICD-10-CM | POA: Diagnosis not present

## 2018-03-26 DIAGNOSIS — L309 Dermatitis, unspecified: Secondary | ICD-10-CM | POA: Diagnosis not present

## 2018-03-26 MED ORDER — VITAMIN D (ERGOCALCIFEROL) 1.25 MG (50000 UNIT) PO CAPS: 50000.0000 [IU] | ORAL_CAPSULE | ORAL | 0 refills | Status: DC

## 2018-03-26 MED ORDER — ALBUTEROL SULFATE HFA 108 (90 BASE) MCG/ACT IN AERS: 2.0000 | INHALATION_SPRAY | Freq: Four times a day (QID) | RESPIRATORY_TRACT | 1 refills | Status: DC | PRN

## 2018-03-26 MED ORDER — BECLOMETHASONE DIPROPIONATE 40 MCG/ACT IN AERS
2.0000 | INHALATION_SPRAY | Freq: Two times a day (BID) | RESPIRATORY_TRACT | 3 refills | Status: DC
Start: 2018-03-26 — End: 2018-06-07

## 2018-03-26 MED ORDER — TRIAMCINOLONE ACETONIDE 0.1 % EX CREA: 1.0000 "application " | TOPICAL_CREAM | Freq: Two times a day (BID) | CUTANEOUS | 3 refills | Status: DC | PRN

## 2018-03-26 MED ORDER — METFORMIN HCL 500 MG PO TABS: 500.0000 mg | ORAL_TABLET | Freq: Every day | ORAL | 0 refills | Status: DC

## 2018-03-26 NOTE — Assessment & Plan Note (Signed)
con't inhalers stable

## 2018-03-26 NOTE — Telephone Encounter (Signed)
Tried calling patient to see if she can come in earlier.  No answer/no vm.

## 2018-03-26 NOTE — Patient Instructions (Signed)

## 2018-03-26 NOTE — Progress Notes (Signed)
Patient ID: Jeanne [name], female    DOB: April 01, 1981  Age: 37 y.o. MRN: [id number]    Subjective:  Subjective  HPI Jeanne [name] presents for med refills for asthma and eczema.  No complaints.    Review of Systems  Constitutional: Negative for chills and fever.  HENT: Negative for congestion and hearing loss.   Eyes: Negative for discharge.  Respiratory: Negative for cough and shortness of breath.   Cardiovascular: Negative for chest pain, palpitations and leg swelling.  Gastrointestinal: Negative for abdominal pain, blood in stool, constipation, diarrhea, nausea and vomiting.  Genitourinary: Negative for dysuria, frequency, hematuria and urgency.  Musculoskeletal: Negative for back pain and myalgias.  Skin: Negative for rash.  Allergic/Immunologic: Negative for environmental allergies.  Neurological: Negative for dizziness, weakness and headaches.  Hematological: Does not bruise/bleed easily.  Psychiatric/Behavioral: Negative for suicidal ideas. The patient is not nervous/anxious.     History Past Medical History:  Diagnosis Date  . Anxiety   . Asthma   . Dry eyes   . Dry skin   . Eczema   . Fatigue   . Fibroids   . H/O echocardiogram    only showed tachycardia  . Heartburn   . HTN (hypertension)   . Hyperlipidemia   . Palpitations   . Pre-diabetes     pre diabetes, Metformin ordered from weight loss clinic  . Rash in adult   . Shortness of breath    due to allergies  . Stress   . Tachycardia   . Vitamin D deficiency   . Wheezing     She has a past surgical history that includes Eye surgery (Bilateral, 2006) and Myomectomy (N/A, 10/30/2017).   Her family history includes Cancer in her maternal grandfather, maternal grandmother, and paternal grandfather; Deep vein thrombosis in her mother; Fibroids in her mother; Hyperlipidemia in her father; Hypertension in her father; Obesity in her father; Sleep apnea in her father; Thyroid disease in her mother.She reports  that she has never smoked. She has never used smokeless tobacco. She reports that she does not drink alcohol or use drugs.  Current Outpatient Medications on File Prior to Visit  Medication Sig Dispense Refill  . calcium carbonate (TUMS - DOSED IN MG ELEMENTAL CALCIUM) 500 MG chewable tablet Chew 1 tablet by mouth 3 (three) times daily as needed for indigestion or heartburn.     . diphenhydrAMINE HCl, Sleep, (ZZZQUIL) 25 MG CAPS Take 25 mg by mouth at bedtime as needed (for sleep.).    [name] ibuprofen (ADVIL,MOTRIN) 600 MG tablet Take 1 tablet (600 mg total) by mouth every 6 (six) hours as needed (mild pain). 30 tablet 0  . metFORMIN (GLUCOPHAGE) 500 MG tablet Take 1 tablet (500 mg total) by mouth daily with breakfast. 30 tablet 0  . metoprolol succinate (TOPROL-XL) 50 MG 24 hr tablet Take 1 tablet (50 mg total) by mouth daily. Take with or immediately following a meal. 90 tablet 3  . Polyethyl Glycol-Propyl Glycol (LUBRICANT EYE DROPS) 0.4-0.3 % SOLN Place 1 drop into both eyes 3 (three) times daily as needed (for dry/allergy eyes.).    [name] sertraline (ZOLOFT) 100 MG tablet Take 100 mg by mouth daily.    Derrill Memo ON 03/30/2018] Vitamin D, Ergocalciferol, (DRISDOL) 50000 units CAPS capsule Take 1 capsule (50,000 Units total) by mouth every Saturday. 4 capsule 0   No current facility-administered medications on file prior to visit.      Objective:  Objective  Physical Exam  Constitutional: She is  oriented to person, place, and time. She appears [name]-developed and [name]-nourished.  HENT:  Head: Normocephalic and atraumatic.  Eyes: Conjunctivae and EOM are normal.  Neck: Normal range of motion. Neck supple. No JVD present. Carotid bruit is not present. No thyromegaly present.  Cardiovascular: Normal rate, regular rhythm and normal heart sounds.  No murmur heard. Pulmonary/Chest: Effort normal and breath sounds normal. No respiratory distress. She has no wheezes. She has no rales. She exhibits no  tenderness.  Musculoskeletal: She exhibits no edema.  Neurological: She is alert and oriented to person, place, and time.  Skin: No rash noted. No erythema.  Psychiatric: She has a normal mood and affect.  Nursing note and vitals reviewed.  BP 110/80 (BP Location: Left Arm, Cuff Size: Large)   Pulse (!) 102   Temp 98 F (36.7 C) (Oral)   Resp 16   Ht 5\' 2"  (1.575 m)   Wt 295 lb 6.4 oz (134 kg)   LMP 03/19/2018   SpO2 96%   BMI 54.03 kg/m  Wt Readings from Last 3 Encounters:  03/26/18 295 lb 6.4 oz (134 kg)  03/25/18 287 lb (130.2 kg)  02/18/18 288 lb (130.6 kg)     Lab Results  Component Value Date   WBC 13.3 (H) 10/31/2017   HGB 13.6 10/31/2017   HCT 40.2 10/31/2017   PLT 268 10/31/2017   GLUCOSE 93 10/17/2017   CHOL 226 (H) 01/01/2018   TRIG 174 (H) 01/01/2018   HDL 56 01/01/2018   LDLCALC 135 (H) 01/01/2018   ALT 15 07/30/2017   AST 14 07/30/2017   NA 138 10/17/2017   K 4.1 10/17/2017   CL 107 10/17/2017   CREATININE 0.73 10/30/2017   BUN 18 10/17/2017   CO2 21 (L) 10/17/2017   TSH 3.340 09/10/2017   HGBA1C 5.6 01/01/2018    No results found.   Assessment & Plan:  Plan  I have changed Jeanne [name]'s PROAIR HFA to albuterol. I am also having her maintain her metoprolol succinate, calcium carbonate, sertraline, diphenhydrAMINE HCl (Sleep), Polyethyl Glycol-Propyl Glycol, ibuprofen, Vitamin D (Ergocalciferol), metFORMIN, triamcinolone cream, and beclomethasone.  Meds ordered this encounter  Medications  . triamcinolone cream (KENALOG) 0.1 %    Sig: Apply 1 application topically 2 (two) times daily as needed (for eczema on hands.).    Dispense:  30 g    Refill:  3  . albuterol (PROAIR HFA) 108 (90 Base) MCG/ACT inhaler    Sig: Inhale 2 puffs into the lungs every 6 (six) hours as needed for wheezing or shortness of breath.    Dispense:  8.5 Inhaler    Refill:  1  . beclomethasone (QVAR) 40 MCG/ACT inhaler    Sig: Inhale 2 puffs into the lungs 2 (two)  times daily.    Dispense:  1 Inhaler    Refill:  3    Problem List Items Addressed This Visit      Unprioritized   Asthma    con't inhalers stable      Relevant Medications   albuterol (PROAIR HFA) 108 (90 Base) MCG/ACT inhaler   beclomethasone (QVAR) 40 MCG/ACT inhaler   Eczema - Primary   Relevant Medications   triamcinolone cream (KENALOG) 0.1 %      Follow-up: Return if symptoms worsen or fail to improve.  Ann Held, DO

## 2018-03-27 NOTE — Progress Notes (Signed)
Office: [phone]  /  Fax: [phone]   HPI:   Chief Complaint: OBESITY Jeanne [name] is here to discuss her progress with her obesity treatment plan. She is on the Category 4 plan and is following her eating plan approximately 50 % of the time. She states she is exercising 0 minutes 0 times per week. Jeanne [name] had to reschedule last appointment secondary to grandfathers death. Work has been very stressful. She is trying to alternate between Category 4 and journaling.  Her weight is 287 lb (130.2 kg) today and has had a weight loss of 1 pounds over a period of 5 weeks since her last visit. She has lost 2 lbs since starting treatment with [place].  Vitamin D Deficiency Jeanne [name] has a diagnosis of vitamin D deficiency. She is currently taking prescription Vit D. She notes fatigue and denies nausea, vomiting or muscle weakness.  Insulin Resistance Jeanne [name] has a diagnosis of insulin resistance based on her elevated fasting insulin level >5. Although Aidah's blood glucose readings are still under good control, insulin resistance puts her at greater risk of metabolic syndrome and diabetes. She denies GI side effects of metformin or hunger, and continues to work on diet and exercise to decrease risk of diabetes.  At risk for diabetes Jeanne [name] is at higher than average risk for developing diabetes due to her obesity and insulin resistance. She currently denies polyuria or polydipsia.  ALLERGIES: No Known Allergies  MEDICATIONS: Current Outpatient Medications on File Prior to Visit  Medication Sig Dispense Refill  . calcium carbonate (TUMS - DOSED IN MG ELEMENTAL CALCIUM) 500 MG chewable tablet Chew 1 tablet by mouth 3 (three) times daily as needed for indigestion or heartburn.     . diphenhydrAMINE HCl, Sleep, (ZZZQUIL) 25 MG CAPS Take 25 mg by mouth at bedtime as needed (for sleep.).    [name] ibuprofen (ADVIL,MOTRIN) 600 MG tablet Take 1 tablet (600 mg total) by mouth every 6 (six) hours as needed (mild pain).  30 tablet 0  . metoprolol succinate (TOPROL-XL) 50 MG 24 hr tablet Take 1 tablet (50 mg total) by mouth daily. Take with or immediately following a meal. 90 tablet 3  . Polyethyl Glycol-Propyl Glycol (LUBRICANT EYE DROPS) 0.4-0.3 % SOLN Place 1 drop into both eyes 3 (three) times daily as needed (for dry/allergy eyes.).    [name] sertraline (ZOLOFT) 100 MG tablet Take 100 mg by mouth daily.     No current facility-administered medications on file prior to visit.     PAST MEDICAL HISTORY: Past Medical History:  Diagnosis Date  . Anxiety   . Asthma   . Dry eyes   . Dry skin   . Eczema   . Fatigue   . Fibroids   . H/O echocardiogram    only showed tachycardia  . Heartburn   . HTN (hypertension)   . Hyperlipidemia   . Palpitations   . Pre-diabetes     pre diabetes, Metformin ordered from weight loss clinic  . Rash in adult   . Shortness of breath    due to allergies  . Stress   . Tachycardia   . Vitamin D deficiency   . Wheezing     PAST SURGICAL HISTORY: Past Surgical History:  Procedure Laterality Date  . EYE SURGERY Bilateral 2006   Lasik  . MYOMECTOMY N/A 10/30/2017   Procedure: ABDOMINAL MYOMECTOMY;  Surgeon: Lavonia Drafts, MD;  Location: [facility];  Service: Gynecology;  Laterality: N/A;    SOCIAL HISTORY: Social History  Tobacco Use  . Smoking status: Never Smoker  . Smokeless tobacco: Never Used  Substance Use Topics  . Alcohol use: No    Alcohol/week: 0.0 standard drinks  . Drug use: No    FAMILY HISTORY: Family History  Problem Relation Age of Onset  . Thyroid disease Mother   . Fibroids Mother   . Deep vein thrombosis Mother   . Cancer Maternal Grandmother        liver   . Cancer Maternal Grandfather        pancreatic  . Hyperlipidemia Father   . Hypertension Father   . Sleep apnea Father   . Obesity Father   . Cancer Paternal Grandfather        prostate    ROS: Review of Systems  Constitutional: Positive for weight loss.    Gastrointestinal: Negative for nausea and vomiting.  Genitourinary: Negative for frequency.  Musculoskeletal:       Negative muscle weakness  Endo/Heme/Allergies: Negative for polydipsia.    PHYSICAL EXAM: Blood pressure (!) 133/92, pulse 98, temperature 97.7 F (36.5 C), temperature source Oral, height 5\' 2"  (1.575 m), weight 287 lb (130.2 kg), last menstrual period 03/19/2018, SpO2 94 %. Body mass index is 52.49 kg/m. Physical Exam  Constitutional: She is oriented to person, place, and time. She appears well-developed and well-nourished.  Cardiovascular: Normal rate.  Pulmonary/Chest: Effort normal.  Musculoskeletal: Normal range of motion.  Neurological: She is oriented to person, place, and time.  Skin: Skin is warm and dry.  Psychiatric: She has a normal mood and affect. Her behavior is normal.  Vitals reviewed.   RECENT LABS AND TESTS: BMET    Component Value Date/Time   NA 138 10/17/2017 1548   K 4.1 10/17/2017 1548   CL 107 10/17/2017 1548   CO2 21 (L) 10/17/2017 1548   GLUCOSE 93 10/17/2017 1548   BUN 18 10/17/2017 1548   CREATININE 0.73 10/30/2017 1819   CALCIUM 9.4 10/17/2017 1548   GFRNONAA >60 10/30/2017 1819   GFRAA >60 10/30/2017 1819   Lab Results  Component Value Date   HGBA1C 5.6 01/01/2018   HGBA1C 5.9 (H) 09/10/2017   Lab Results  Component Value Date   INSULIN 19.2 01/01/2018   INSULIN 12.3 09/10/2017   CBC    Component Value Date/Time   WBC 13.3 (H) 10/31/2017 0516   RBC 4.27 10/31/2017 0516   HGB 13.6 10/31/2017 0516   HCT 40.2 10/31/2017 0516   PLT 268 10/31/2017 0516   MCV 94.1 10/31/2017 0516   MCH 31.9 10/31/2017 0516   MCHC 33.8 10/31/2017 0516   RDW 13.6 10/31/2017 0516   LYMPHSABS 2.4 07/30/2017 1631   MONOABS 0.5 07/30/2017 1631   EOSABS 0.7 07/30/2017 1631   BASOSABS 0.1 07/30/2017 1631   Iron/TIBC/Ferritin/ %Sat No results found for: IRON, TIBC, FERRITIN, IRONPCTSAT Lipid Panel     Component Value Date/Time    CHOL 226 (H) 01/01/2018 0822   TRIG 174 (H) 01/01/2018 0822   HDL 56 01/01/2018 0822   CHOLHDL 3.6 09/10/2017 1058   CHOLHDL 4 07/03/2017 1609   VLDL 19.6 07/03/2017 1609   LDLCALC 135 (H) 01/01/2018 0822   Hepatic Function Panel     Component Value Date/Time   PROT 7.0 07/30/2017 1631   ALBUMIN 3.9 07/30/2017 1631   AST 14 07/30/2017 1631   ALT 15 07/30/2017 1631   ALKPHOS 79 07/30/2017 1631   BILITOT 0.2 07/30/2017 1631      Component Value Date/Time  TSH 3.340 09/10/2017 1058   TSH 3.55 07/30/2017 1631   TSH 2.28 08/17/2016 1707  Results for NOCOLE, ZAMMIT (MRN [id number]) as of 03/27/2018 18:14  Ref. Range 01/01/2018 08:22  Vitamin D, 25-Hydroxy Latest Ref Range: 30.0 - 100.0 ng/mL 21.8 (L)    ASSESSMENT AND PLAN: Vitamin D deficiency - Plan: Vitamin D, Ergocalciferol, (DRISDOL) 50000 units CAPS capsule  Insulin resistance - Plan: metFORMIN (GLUCOPHAGE) 500 MG tablet  At risk for diabetes mellitus  Class 3 severe obesity with serious comorbidity and body mass index (BMI) of 50.0 to 59.9 in adult, unspecified obesity type ([place])  PLAN:  Vitamin D Deficiency Jeanne [name] was informed that low vitamin D levels contributes to fatigue and are associated with obesity, breast, and colon cancer. Jeanne [name] agrees to continue taking prescription Vit D @50 ,000 IU every week #4 and we will refill for 1 month. She will follow up for routine testing of vitamin D, at least 2-3 times per year. She was informed of the risk of over-replacement of vitamin D and agrees to not increase her dose unless she discusses this with [place] first. Jeanne [name] agrees to follow up with our clinic in 2 weeks.  Insulin Resistance Jeanne [name] will continue to work on weight loss, exercise, and decreasing simple carbohydrates in her diet to help decrease the risk of diabetes. We dicussed metformin including benefits and risks. She was informed that eating too many simple carbohydrates or too many calories at one sitting  increases the likelihood of GI side effects. Jeanne [name] agrees to continue taking metformin 500 mg PO q AM #30 and we will refill for 1 month. Jeanne [name] agrees to follow up with our clinic in 2 weeks as directed to monitor her progress.  Diabetes risk counselling Jeanne [name] was given extended (15 minutes) diabetes prevention counseling today. She is 37 y.o. female and has risk factors for diabetes including obesity and insulin resistance. We discussed intensive lifestyle modifications today with an emphasis on weight loss as well as increasing exercise and decreasing simple carbohydrates in her diet.  Obesity Jeanne [name] is currently in the action stage of change. As such, her goal is to continue with weight loss efforts She has agreed to follow the Category 4 plan Jeanne [name] has been instructed to work up to a goal of 150 minutes of combined cardio and strengthening exercise per week for weight loss and overall health benefits. We discussed the following Behavioral Modification Strategies today: increasing lean protein intake, increasing vegetables, work on meal planning and easy cooking plans, and planning for success   Jeanne [name] has agreed to follow up with our clinic in 2 weeks. She was informed of the importance of frequent follow up visits to maximize her success with intensive lifestyle modifications for her multiple health conditions.   OBESITY BEHAVIORAL INTERVENTION VISIT  Today's visit was # 11   Starting weight: 297 lbs Starting date: 09/10/17 Today's weight : 287 lbs  Today's date: 03/25/2018 Total lbs lost to date: 2    ASK: We discussed the diagnosis of obesity with Jeanne [name] Age today and Jeanne [name] agreed to give [place] permission to discuss obesity behavioral modification therapy today.  ASSESS: Collen has the diagnosis of obesity and her BMI today is 54.02 Jeanne [name] is in the action stage of change   ADVISE: Han was educated on the multiple health risks of obesity as well as the benefit of  weight loss to improve her health. She was advised of the need for long term treatment and the importance of lifestyle modifications to  improve her current health and to decrease her risk of future health problems.  AGREE: Multiple dietary modification options and treatment options were discussed and  Jeanne [name] agreed to follow the recommendations documented in the above note.  ARRANGE: Marlina was educated on the importance of frequent visits to treat obesity as outlined per CMS and USPSTF guidelines and agreed to schedule her next follow up appointment today.  I, Trixie Dredge, am acting as transcriptionist for Ilene Qua, MD  I have reviewed the above documentation for accuracy and completeness, and I agree with the above. - Ilene Qua, MD

## 2018-04-11 ENCOUNTER — Ambulatory Visit (INDEPENDENT_AMBULATORY_CARE_PROVIDER_SITE_OTHER): Payer: BC Managed Care – PPO | Admitting: Family Medicine

## 2018-04-11 VITALS — BP 138/86 | HR 91 | Temp 97.6°F | Ht 62.0 in | Wt 294.0 lb

## 2018-04-11 DIAGNOSIS — R7303 Prediabetes: Secondary | ICD-10-CM

## 2018-04-11 DIAGNOSIS — E559 Vitamin D deficiency, unspecified: Secondary | ICD-10-CM | POA: Diagnosis not present

## 2018-04-11 DIAGNOSIS — Z9189 Other specified personal risk factors, not elsewhere classified: Secondary | ICD-10-CM | POA: Diagnosis not present

## 2018-04-11 DIAGNOSIS — Z6841 Body Mass Index (BMI) 40.0 and over, adult: Secondary | ICD-10-CM

## 2018-04-11 MED ORDER — LIRAGLUTIDE 18 MG/3ML ~~LOC~~ SOPN: 0.6000 mg | PEN_INJECTOR | SUBCUTANEOUS | 0 refills | Status: DC

## 2018-04-11 MED ORDER — INSULIN PEN NEEDLE 32G X 4 MM MISC: 1.0000 | Freq: Two times a day (BID) | 0 refills | Status: DC

## 2018-04-11 MED ORDER — VITAMIN D (ERGOCALCIFEROL) 1.25 MG (50000 UNIT) PO CAPS: 50000.0000 [IU] | ORAL_CAPSULE | ORAL | 0 refills | Status: DC

## 2018-04-12 ENCOUNTER — Encounter (INDEPENDENT_AMBULATORY_CARE_PROVIDER_SITE_OTHER): Payer: Self-pay | Admitting: Family Medicine

## 2018-04-15 NOTE — Progress Notes (Signed)
Office: [phone]  /  Fax: [phone]   HPI:   Chief Complaint: OBESITY Jeanne [name] is here to discuss her progress with her obesity treatment plan. She is on the Category 4 plan and is following her eating plan approximately 45 % of the time. She states she is exercising 0 minutes 0 times per week. Jeanne [name] has had increased stress at work which has led to emotional eating, not planning and bringing food to work, or planning for dinner.  Her weight is 294 lb (133.4 kg) today and has had a weight gain of 7 pounds over a period of 3 weeks since her last visit. She has lost 3 lbs since starting treatment with Jeanne [name].  Pre-Diabetes Jeanne [name] has a diagnosis of pre-diabetes based on her elevated Hgb A1c and was informed this puts her at greater risk of developing diabetes. She is taking metformin currently and is having miminum hunger control with metformin. Her last Insulin was elevated at 19.2 on 01/01/18 and she continues to work on diet and exercise to decrease risk of diabetes. She admits to carb cravings.   At risk for diabetes Jeanne [name] is at higher than average risk for developing diabetes due to her pre-diabetes and obesity. She currently denies polyuria or polydipsia.  Vitamin D deficiency Jeanne [name] has a diagnosis of vitamin D deficiency. She is currently taking vit D. She admits fatigue and denies nausea, vomiting, or muscle weakness.  ALLERGIES: No Known Allergies  MEDICATIONS: Current Outpatient Medications on File Prior to Visit  Medication Sig Dispense Refill  . albuterol (PROAIR HFA) 108 (90 Base) MCG/ACT inhaler Inhale 2 puffs into the lungs every 6 (six) hours as needed for wheezing or shortness of breath. 8.5 Inhaler 1  . beclomethasone (QVAR) 40 MCG/ACT inhaler Inhale 2 puffs into the lungs 2 (two) times daily. 1 Inhaler 3  . calcium carbonate (TUMS - DOSED IN MG ELEMENTAL CALCIUM) 500 MG chewable tablet Chew 1 tablet by mouth 3 (three) times daily as needed for indigestion or  heartburn.     . diphenhydrAMINE HCl, Sleep, (ZZZQUIL) 25 MG CAPS Take 25 mg by mouth at bedtime as needed (for sleep.).    [name] ibuprofen (ADVIL,MOTRIN) 600 MG tablet Take 1 tablet (600 mg total) by mouth every 6 (six) hours as needed (mild pain). 30 tablet 0  . metoprolol succinate (TOPROL-XL) 50 MG 24 hr tablet Take 1 tablet (50 mg total) by mouth daily. Take with or immediately following a meal. 90 tablet 3  . Polyethyl Glycol-Propyl Glycol (LUBRICANT EYE DROPS) 0.4-0.3 % SOLN Place 1 drop into both eyes 3 (three) times daily as needed (for dry/allergy eyes.).    [name] sertraline (ZOLOFT) 100 MG tablet Take 100 mg by mouth daily.    [name] triamcinolone cream (KENALOG) 0.1 % Apply 1 application topically 2 (two) times daily as needed (for eczema on hands.). 30 g 3   No current facility-administered medications on file prior to visit.     PAST MEDICAL HISTORY: Past Medical History:  Diagnosis Date  . Anxiety   . Asthma   . Dry eyes   . Dry skin   . Eczema   . Fatigue   . Fibroids   . H/O echocardiogram    only showed tachycardia  . Heartburn   . HTN (hypertension)   . Hyperlipidemia   . Palpitations   . Pre-diabetes     pre diabetes, Metformin ordered from weight loss clinic  . Rash in adult   . Shortness of breath  due to allergies  . Stress   . Tachycardia   . Vitamin D deficiency   . Wheezing     PAST SURGICAL HISTORY: Past Surgical History:  Procedure Laterality Date  . EYE SURGERY Bilateral 2006   Lasik  . MYOMECTOMY N/A 10/30/2017   Procedure: ABDOMINAL MYOMECTOMY;  Surgeon: Lavonia Drafts, MD;  Location: [facility];  Service: Gynecology;  Laterality: N/A;    SOCIAL HISTORY: Social History   Tobacco Use  . Smoking status: Never Smoker  . Smokeless tobacco: Never Used  Substance Use Topics  . Alcohol use: No    Alcohol/week: 0.0 standard drinks  . Drug use: No    FAMILY HISTORY: Family History  Problem Relation [name] of Onset  . Thyroid disease Mother     . Fibroids Mother   . Deep vein thrombosis Mother   . Cancer Maternal Grandmother        liver   . Cancer Maternal Grandfather        pancreatic  . Hyperlipidemia Father   . Hypertension Father   . Sleep apnea Father   . Obesity Father   . Cancer Paternal Grandfather        prostate    ROS: Review of Systems  Constitutional: Positive for malaise/fatigue. Negative for weight loss.  Gastrointestinal: Negative for nausea and vomiting.  Genitourinary:       Negative for polyuria.  Musculoskeletal:       Negative for muscle weakness.  Endo/Heme/Allergies: Negative for polydipsia.       Positive for carb cravings.    PHYSICAL EXAM: Blood pressure 138/86, pulse 91, temperature 97.6 F (36.4 C), temperature source Oral, height 5\' 2"  (1.575 m), weight 294 lb (133.4 kg), last menstrual period 03/19/2018, SpO2 94 %. Body mass index is 53.77 kg/m. Physical Exam  Constitutional: She is oriented to person, place, and time. She appears well-developed and well-nourished.  Cardiovascular: Normal rate.  Pulmonary/Chest: Effort normal.  Musculoskeletal: Normal range of motion.  Neurological: She is oriented to person, place, and time.  Skin: Skin is warm and dry.  Psychiatric: She has a normal mood and affect. Her behavior is normal.  Vitals reviewed.   RECENT LABS AND TESTS: BMET    Component Value Date/Time   NA 138 10/17/2017 1548   K 4.1 10/17/2017 1548   CL 107 10/17/2017 1548   CO2 21 (L) 10/17/2017 1548   GLUCOSE 93 10/17/2017 1548   BUN 18 10/17/2017 1548   CREATININE 0.73 10/30/2017 1819   CALCIUM 9.4 10/17/2017 1548   GFRNONAA >60 10/30/2017 1819   GFRAA >60 10/30/2017 1819   Lab Results  Component Value Date   HGBA1C 5.6 01/01/2018   HGBA1C 5.9 (H) 09/10/2017   Lab Results  Component Value Date   INSULIN 19.2 01/01/2018   INSULIN 12.3 09/10/2017   CBC    Component Value Date/Time   WBC 13.3 (H) 10/31/2017 0516   RBC 4.27 10/31/2017 0516   HGB 13.6  10/31/2017 0516   HCT 40.2 10/31/2017 0516   PLT 268 10/31/2017 0516   MCV 94.1 10/31/2017 0516   MCH 31.9 10/31/2017 0516   MCHC 33.8 10/31/2017 0516   RDW 13.6 10/31/2017 0516   LYMPHSABS 2.4 07/30/2017 1631   MONOABS 0.5 07/30/2017 1631   EOSABS 0.7 07/30/2017 1631   BASOSABS 0.1 07/30/2017 1631   Iron/TIBC/Ferritin/ %Sat No results found for: IRON, TIBC, FERRITIN, IRONPCTSAT Lipid Panel     Component Value Date/Time   CHOL 226 (H) 01/01/2018 0539  TRIG 174 (H) 01/01/2018 0822   HDL 56 01/01/2018 0822   CHOLHDL 3.6 09/10/2017 1058   CHOLHDL 4 07/03/2017 1609   VLDL 19.6 07/03/2017 1609   LDLCALC 135 (H) 01/01/2018 0822   Hepatic Function Panel     Component Value Date/Time   PROT 7.0 07/30/2017 1631   ALBUMIN 3.9 07/30/2017 1631   AST 14 07/30/2017 1631   ALT 15 07/30/2017 1631   ALKPHOS 79 07/30/2017 1631   BILITOT 0.2 07/30/2017 1631      Component Value Date/Time   TSH 3.340 09/10/2017 1058   TSH 3.55 07/30/2017 1631   TSH 2.28 08/17/2016 1707   Results for SHELISE, MARON (MRN [id number]) as of 04/15/2018 07:38  Ref. Range 01/01/2018 08:22  Vitamin D, 25-Hydroxy Latest Ref Range: 30.0 - 100.0 ng/mL 21.8 (L)   ASSESSMENT AND PLAN: Prediabetes - Plan: liraglutide (VICTOZA) 18 MG/3ML SOPN, Insulin Pen Needle (BD PEN NEEDLE NANO 2ND GEN) 32G X 4 MM MISC  Vitamin D deficiency - Plan: Vitamin D, Ergocalciferol, (DRISDOL) 1.25 MG (50000 UT) CAPS capsule  At risk for diabetes mellitus  Class 3 severe obesity with serious comorbidity and body mass index (BMI) of 50.0 to 59.9 in adult, unspecified obesity type Jeanne [name])  PLAN:  Pre-Diabetes Jeanne [name] will continue to work on weight loss, exercise, and decreasing simple carbohydrates in her diet to help decrease the risk of diabetes. She was informed that eating too many simple carbohydrates or too many calories at one sitting increases the likelihood of GI side effects. Kellina agreed to start Victoza 0.6mg  SubQ daily #2  pens with 1 box of nanoneedles. She will stop metformin. Jeanne [name] agreed to follow up with Jeanne [name] as directed to monitor her progress in 2 weeks.  Diabetes risk counseling Sarie was given extended (15 minutes) diabetes prevention counseling today. She is 37 y.o. female and has risk factors for diabetes including pre-diabetes and obesity. We discussed intensive lifestyle modifications today with an emphasis on weight loss as well as increasing exercise and decreasing simple carbohydrates in her diet.  Vitamin D Deficiency Jeanne [name] was informed that low vitamin D levels contributes to fatigue and are associated with obesity, breast, and colon cancer. She agrees to continue to take prescription Vit D @50 ,000 IU every week #4 with no refills and will follow up for routine testing of vitamin D, at least 2-3 times per year. She was informed of the risk of over-replacement of vitamin D and agrees to not increase her dose unless she discusses this with Jeanne [name] first. She agrees to follow up as directed.  Obesity Jeanne [name] is currently in the action stage of change. As such, her goal is to continue with weight loss efforts. She has agreed to follow the Category 4 plan. Jeanne [name] agrees to follow breakfast and lunch for 5 of 7 days and portion control and smarter choices for dinner. Jeanne [name] has been instructed to work up to a goal of 150 minutes of combined cardio and strengthening exercise per week for weight loss and overall health benefits. We discussed the following Behavioral Modification Strategies today: increasing lean protein intake, increasing vegetables, work on meal planning and easy cooking plans, and planning for success.  Jeanne [name] has agreed to follow up with our clinic in 2 weeks. She was informed of the importance of frequent follow up visits to maximize her success with intensive lifestyle modifications for her multiple health conditions.   OBESITY BEHAVIORAL INTERVENTION VISIT  Today's visit was # 12    Starting weight: 297  lbs Starting date: 09/10/17 Today's weight : Weight: 294 lb (133.4 kg)  Today's date: 04/11/2018 Total lbs lost to date: 3    ASK: We discussed the diagnosis of obesity with Jeanne [name] today and Jeanne [name] agreed to give Jeanne [name] permission to discuss obesity behavioral modification therapy today.  ASSESS: Jeanne [name] has the diagnosis of obesity and her BMI today is 53.76. Jeanne [name] is in the action stage of change   ADVISE: Jeanne [name] was educated on the multiple health risks of obesity as well as the benefit of weight loss to improve her health. She was advised of the need for long term treatment and the importance of lifestyle modifications to improve her current health and to decrease her risk of future health problems.  AGREE: Multiple dietary modification options and treatment options were discussed and Jeanne [name] agreed to follow the recommendations documented in the above note.  ARRANGE: Jeanne [name] was educated on the importance of frequent visits to treat obesity as outlined per CMS and USPSTF guidelines and agreed to schedule her next follow up appointment today.  I, Jeanne [name], am acting as Location manager for Jeanne Jones, MD  I have reviewed the above documentation for accuracy and completeness, and I agree with the above. - Ilene Qua, MD

## 2018-04-25 ENCOUNTER — Encounter (INDEPENDENT_AMBULATORY_CARE_PROVIDER_SITE_OTHER): Payer: Self-pay | Admitting: Family Medicine

## 2018-04-25 ENCOUNTER — Ambulatory Visit (INDEPENDENT_AMBULATORY_CARE_PROVIDER_SITE_OTHER): Payer: BC Managed Care – PPO | Admitting: Family Medicine

## 2018-04-25 VITALS — BP 129/83 | HR 83 | Temp 97.9°F | Ht 62.0 in | Wt 292.0 lb

## 2018-04-25 DIAGNOSIS — E559 Vitamin D deficiency, unspecified: Secondary | ICD-10-CM

## 2018-04-25 DIAGNOSIS — R7303 Prediabetes: Secondary | ICD-10-CM

## 2018-04-25 DIAGNOSIS — D72829 Elevated white blood cell count, unspecified: Secondary | ICD-10-CM | POA: Diagnosis not present

## 2018-04-25 DIAGNOSIS — Z6841 Body Mass Index (BMI) 40.0 and over, adult: Secondary | ICD-10-CM

## 2018-04-25 DIAGNOSIS — Z9189 Other specified personal risk factors, not elsewhere classified: Secondary | ICD-10-CM | POA: Diagnosis not present

## 2018-04-25 DIAGNOSIS — E785 Hyperlipidemia, unspecified: Secondary | ICD-10-CM | POA: Diagnosis not present

## 2018-04-25 MED ORDER — LIRAGLUTIDE 18 MG/3ML ~~LOC~~ SOPN: 1.2000 mg | PEN_INJECTOR | SUBCUTANEOUS | 0 refills | Status: DC

## 2018-04-26 LAB — CBC WITH DIFFERENTIAL/PLATELET
Basophils Absolute: 0.1 10*3/uL (ref 0.0–0.2)
Basos: 1 %
EOS (ABSOLUTE): 0.3 10*3/uL (ref 0.0–0.4)
Eos: 4 %
Hematocrit: 43 % (ref 34.0–46.6)
Hemoglobin: 14.3 g/dL (ref 11.1–15.9)
Immature Grans (Abs): 0.1 10*3/uL (ref 0.0–0.1)
Immature Granulocytes: 1 %
Lymphocytes Absolute: 2.3 10*3/uL (ref 0.7–3.1)
Lymphs: 29 %
MCH: 29.8 pg (ref 26.6–33.0)
MCHC: 33.3 g/dL (ref 31.5–35.7)
MCV: 90 fL (ref 79–97)
Monocytes Absolute: 0.4 10*3/uL (ref 0.1–0.9)
Monocytes: 5 %
Neutrophils Absolute: 4.7 10*3/uL (ref 1.4–7.0)
Neutrophils: 60 %
Platelets: 363 10*3/uL (ref 150–450)
RBC: 4.8 x10E6/uL (ref 3.77–5.28)
RDW: 12.6 % (ref 12.3–15.4)
WBC: 7.8 10*3/uL (ref 3.4–10.8)

## 2018-04-26 LAB — COMPREHENSIVE METABOLIC PANEL
ALT: 16 IU/L (ref 0–32)
AST: 15 IU/L (ref 0–40)
Albumin/Globulin Ratio: 1.5 (ref 1.2–2.2)
Albumin: 3.9 g/dL (ref 3.5–5.5)
Alkaline Phosphatase: 86 IU/L (ref 39–117)
BUN/Creatinine Ratio: 18 (ref 9–23)
BUN: 10 mg/dL (ref 6–20)
Bilirubin Total: 0.3 mg/dL (ref 0.0–1.2)
CO2: 20 mmol/L (ref 20–29)
Calcium: 9.3 mg/dL (ref 8.7–10.2)
Chloride: 101 mmol/L (ref 96–106)
Creatinine, Ser: 0.56 mg/dL — ABNORMAL LOW (ref 0.57–1.00)
GFR calc Af Amer: 140 mL/min/{1.73_m2} (ref >59)
GFR calc non Af Amer: 121 mL/min/{1.73_m2} (ref >59)
Globulin, Total: 2.6 g/dL (ref 1.5–4.5)
Glucose: 89 mg/dL (ref 65–99)
Potassium: 4.3 mmol/L (ref 3.5–5.2)
Sodium: 137 mmol/L (ref 134–144)
Total Protein: 6.5 g/dL (ref 6.0–8.5)

## 2018-04-26 LAB — LIPID PANEL
Chol/HDL Ratio: 4.3 ratio (ref 0.0–4.4)
Cholesterol, Total: 204 mg/dL — ABNORMAL HIGH (ref 100–199)
HDL: 48 mg/dL (ref >39)
LDL Calculated: 132 mg/dL — ABNORMAL HIGH (ref 0–99)
Triglycerides: 118 mg/dL (ref 0–149)
VLDL Cholesterol Cal: 24 mg/dL (ref 5–40)

## 2018-04-26 LAB — INSULIN, RANDOM: INSULIN: 19.6 u[IU]/mL (ref 2.6–24.9)

## 2018-04-26 LAB — HEMOGLOBIN A1C
Est. average glucose Bld gHb Est-mCnc: 126 mg/dL
Hgb A1c MFr Bld: 6 % — ABNORMAL HIGH (ref 4.8–5.6)

## 2018-04-26 LAB — VITAMIN D 25 HYDROXY (VIT D DEFICIENCY, FRACTURES): Vit D, 25-Hydroxy: 26.9 ng/mL — ABNORMAL LOW (ref 30.0–100.0)

## 2018-04-30 NOTE — Progress Notes (Signed)
Office: [phone]  /  Fax: [phone]   HPI:   Chief Complaint: OBESITY Jeanne [name] is here to discuss her progress with her obesity treatment plan. She is on the Category 3 plan and is following her eating plan approximately 65% of the time. She states she is exercising 0 minutes 0 times per week. Jeanne [name] is sticking to the Category 4 plan better with Victoza because it is helping control hunger. She is meal planning better and more often. Her weight is 292 lb (132.5 kg) today and has had a weight loss of 2 pounds over a period of 2 weeks since her last visit. She has lost 5 lbs since starting treatment with [place].  Leukocytosis Jeanne [name] had elevated WBC's at the last check.  Pre-Diabetes Jeanne [name] has a diagnosis of prediabetes based on her elevated Hgb A1c and was informed this puts her at greater risk of developing diabetes. She started on Victoza at the last visit. Her polyphagia improved since starting Victoza. She had mild nausea with Victoza that resolved. She continues to work on diet and exercise to decrease risk of diabetes. She denies constipation..  At risk for diabetes Jeanne [name] is at higher than average risk for developing diabetes due to her obesity and pre-diabetes. She currently denies polyuria or polydipsia.  Hyperlipidemia Jeanne [name] has hyperlipidemia and her last LDL was at 135 on 01/01/2018 and her triglycerides are also elevated at 179 on 01/01/2018. She is not on statin. Jeanne [name] has been trying to improve her cholesterol levels with intensive lifestyle modification including a low saturated fat diet, exercise and weight loss. She denies any chest pain, shortness of breath or claudication.  Vitamin D deficiency Jeanne [name] has a diagnosis of vitamin D deficiency. She is currently taking vit D and she is not at goal. Jeanne [name] denies nausea, vomiting or muscle weakness.  ALLERGIES: No Known Allergies  MEDICATIONS: Current Outpatient Medications on File Prior to Visit  Medication Sig  Dispense Refill  . albuterol (PROAIR HFA) 108 (90 Base) MCG/ACT inhaler Inhale 2 puffs into the lungs every 6 (six) hours as needed for wheezing or shortness of breath. 8.5 Inhaler 1  . beclomethasone (QVAR) 40 MCG/ACT inhaler Inhale 2 puffs into the lungs 2 (two) times daily. 1 Inhaler 3  . calcium carbonate (TUMS - DOSED IN MG ELEMENTAL CALCIUM) 500 MG chewable tablet Chew 1 tablet by mouth 3 (three) times daily as needed for indigestion or heartburn.     . diphenhydrAMINE HCl, Sleep, (ZZZQUIL) 25 MG CAPS Take 25 mg by mouth at bedtime as needed (for sleep.).    [name] ibuprofen (ADVIL,MOTRIN) 600 MG tablet Take 1 tablet (600 mg total) by mouth every 6 (six) hours as needed (mild pain). 30 tablet 0  . Insulin Pen Needle (BD PEN NEEDLE NANO 2ND GEN) 32G X 4 MM MISC 1 Package by Does not apply route 2 (two) times daily. 100 each 0  . metoprolol succinate (TOPROL-XL) 50 MG 24 hr tablet Take 1 tablet (50 mg total) by mouth daily. Take with or immediately following a meal. 90 tablet 3  . Polyethyl Glycol-Propyl Glycol (LUBRICANT EYE DROPS) 0.4-0.3 % SOLN Place 1 drop into both eyes 3 (three) times daily as needed (for dry/allergy eyes.).    [name] sertraline (ZOLOFT) 100 MG tablet Take 100 mg by mouth daily.    [name] triamcinolone cream (KENALOG) 0.1 % Apply 1 application topically 2 (two) times daily as needed (for eczema on hands.). 30 g 3  . Vitamin D, Ergocalciferol, (DRISDOL) 1.25 MG (  50000 UT) CAPS capsule Take 1 capsule (50,000 Units total) by mouth every Saturday. 4 capsule 0   No current facility-administered medications on file prior to visit.     PAST MEDICAL HISTORY: Past Medical History:  Diagnosis Date  . Anxiety   . Asthma   . Dry eyes   . Dry skin   . Eczema   . Fatigue   . Fibroids   . H/O echocardiogram    only showed tachycardia  . Heartburn   . HTN (hypertension)   . Hyperlipidemia   . Palpitations   . Pre-diabetes     pre diabetes, Metformin ordered from weight loss clinic  .  Rash in adult   . Shortness of breath    due to allergies  . Stress   . Tachycardia   . Vitamin D deficiency   . Wheezing     PAST SURGICAL HISTORY: Past Surgical History:  Procedure Laterality Date  . EYE SURGERY Bilateral 2006   Lasik  . MYOMECTOMY N/A 10/30/2017   Procedure: ABDOMINAL MYOMECTOMY;  Surgeon: Lavonia Drafts, MD;  Location: [facility];  Service: Gynecology;  Laterality: N/A;    SOCIAL HISTORY: Social History   Tobacco Use  . Smoking status: Never Smoker  . Smokeless tobacco: Never Used  Substance Use Topics  . Alcohol use: No    Alcohol/week: 0.0 standard drinks  . Drug use: No    FAMILY HISTORY: Family History  Problem Relation [name] of Onset  . Thyroid disease Mother   . Fibroids Mother   . Deep vein thrombosis Mother   . Cancer Maternal Grandmother        liver   . Cancer Maternal Grandfather        pancreatic  . Hyperlipidemia Father   . Hypertension Father   . Sleep apnea Father   . Obesity Father   . Cancer Paternal Grandfather        prostate    ROS: Review of Systems  Constitutional: Positive for weight loss.  Respiratory: Negative for shortness of breath.   Cardiovascular: Negative for chest pain and claudication.  Gastrointestinal: Negative for constipation, nausea and vomiting.  Genitourinary: Negative for frequency.  Musculoskeletal:       Negative for muscle weakness  Endo/Heme/Allergies: Negative for polydipsia.       Negative for hypoglycemia    PHYSICAL EXAM: Blood pressure 129/83, pulse 83, temperature 97.9 F (36.6 C), temperature source Oral, height 5\' 2"  (1.575 m), weight 292 lb (132.5 kg), SpO2 96 %. Body mass index is 53.41 kg/m. Physical Exam  Constitutional: She is oriented to person, place, and time. She appears well-developed and well-nourished.  Cardiovascular: Normal rate.  Pulmonary/Chest: Effort normal.  Musculoskeletal: Normal range of motion.  Neurological: She is oriented to person, place, and  time.  Skin: Skin is warm and dry.  Psychiatric: She has a normal mood and affect. Her behavior is normal.  Vitals reviewed.   RECENT LABS AND TESTS: BMET    Component Value Date/Time   NA 137 04/25/2018 1318   K 4.3 04/25/2018 1318   CL 101 04/25/2018 1318   CO2 20 04/25/2018 1318   GLUCOSE 89 04/25/2018 1318   GLUCOSE 93 10/17/2017 1548   BUN 10 04/25/2018 1318   CREATININE 0.56 (L) 04/25/2018 1318   CALCIUM 9.3 04/25/2018 1318   GFRNONAA 121 04/25/2018 1318   GFRAA 140 04/25/2018 1318   Lab Results  Component Value Date   HGBA1C 6.0 (H) 04/25/2018   HGBA1C 5.6  01/01/2018   HGBA1C 5.9 (H) 09/10/2017   Lab Results  Component Value Date   INSULIN 19.6 04/25/2018   INSULIN 19.2 01/01/2018   INSULIN 12.3 09/10/2017   CBC    Component Value Date/Time   WBC 7.8 04/25/2018 1318   WBC 13.3 (H) 10/31/2017 0516   RBC 4.80 04/25/2018 1318   RBC 4.27 10/31/2017 0516   HGB 14.3 04/25/2018 1318   HCT 43.0 04/25/2018 1318   PLT 363 04/25/2018 1318   MCV 90 04/25/2018 1318   MCH 29.8 04/25/2018 1318   MCH 31.9 10/31/2017 0516   MCHC 33.3 04/25/2018 1318   MCHC 33.8 10/31/2017 0516   RDW 12.6 04/25/2018 1318   LYMPHSABS 2.3 04/25/2018 1318   MONOABS 0.5 07/30/2017 1631   EOSABS 0.3 04/25/2018 1318   BASOSABS 0.1 04/25/2018 1318   Iron/TIBC/Ferritin/ %Sat No results found for: IRON, TIBC, FERRITIN, IRONPCTSAT Lipid Panel     Component Value Date/Time   CHOL 204 (H) 04/25/2018 1318   TRIG 118 04/25/2018 1318   HDL 48 04/25/2018 1318   CHOLHDL 4.3 04/25/2018 1318   CHOLHDL 4 07/03/2017 1609   VLDL 19.6 07/03/2017 1609   LDLCALC 132 (H) 04/25/2018 1318   Hepatic Function Panel     Component Value Date/Time   PROT 6.5 04/25/2018 1318   ALBUMIN 3.9 04/25/2018 1318   AST 15 04/25/2018 1318   ALT 16 04/25/2018 1318   ALKPHOS 86 04/25/2018 1318   BILITOT 0.3 04/25/2018 1318      Component Value Date/Time   TSH 3.340 09/10/2017 1058   TSH 3.55 07/30/2017 1631     TSH 2.28 08/17/2016 1707   Results for KERI, VEALE (MRN [id number]) as of 04/30/2018 16:36  Ref. Range 01/01/2018 08:22  Vitamin D, 25-Hydroxy Latest Ref Range: 30.0 - 100.0 ng/mL 21.8 (L)   ASSESSMENT AND PLAN: Leukocytosis, unspecified type - Plan: CBC with Differential/Platelet, Comprehensive metabolic panel  Prediabetes - Plan: liraglutide (VICTOZA) 18 MG/3ML SOPN, Hemoglobin A1c, Insulin, random  Hyperlipidemia, unspecified hyperlipidemia type - Plan: Lipid panel  Vitamin D deficiency - Plan: VITAMIN D 25 Hydroxy (Vit-D Deficiency, Fractures)  At risk for diabetes mellitus  Class 3 severe obesity with serious comorbidity and body mass index (BMI) of 50.0 to 59.9 in adult, unspecified obesity type ([place])  PLAN:  Leukocytosis We will recheck labs today and Marlisha will follow up with our clinic in 2 weeks.  Pre-Diabetes Samyukta will continue to work on weight loss, exercise, and decreasing simple carbohydrates in her diet to help decrease the risk of diabetes. She was informed that eating too many simple carbohydrates or too many calories at one sitting increases the likelihood of GI side effects. She agrees to increase Victoza to 1.2 mg daily #3 pens with no refills. We will check Hgb A1c and fasting insulin today.  Boluwatife agreed to follow up with [place] as directed to monitor her progress.  Diabetes risk counseling Jeanne [name] was given extended (15 minutes) diabetes prevention counseling today. She is 37 y.o. female and has risk factors for diabetes including obesity and pre-diabetes. We discussed intensive lifestyle modifications today with an emphasis on weight loss as well as increasing exercise and decreasing simple carbohydrates in her diet.  Hyperlipidemia Jeanne [name] was informed of the American Heart Association Guidelines emphasizing intensive lifestyle modifications as the first line treatment for hyperlipidemia. We discussed many lifestyle modifications today in depth, and Jeanne [name]  will continue to work on decreasing saturated fats such as fatty red meat, butter and many  fried foods. She will also increase vegetables and lean protein in her diet and continue to work on exercise and weight loss efforts. We will check fasting lipid panel today.  Vitamin D Deficiency Jeanne [name] was informed that low vitamin D levels contributes to fatigue and are associated with obesity, breast, and colon cancer. She agrees to continue to take prescription Vit D @50 ,000 IU every week and will follow up for routine testing of vitamin D, at least 2-3 times per year. She was informed of the risk of over-replacement of vitamin D and agrees to not increase her dose unless she discusses this with [place] first. We will check vitamin D level today and Jeanne [name] will follow up as directed.  Obesity Jeanne [name] is currently in the action stage of change. As such, her goal is to continue with weight loss efforts She has agreed to follow the Category 4 plan We discussed the following Behavioral Modification Strategies today: planning for success, work on meal planning and easy cooking plans and holiday eating strategies   Jeanne [name] has not been prescribed exercise at this time. Thanksgiving handout provided to patient today.  Jeanne [name] has agreed to follow up with our clinic in 2 weeks. She was informed of the importance of frequent follow up visits to maximize her success with intensive lifestyle modifications for her multiple health conditions.   OBESITY BEHAVIORAL INTERVENTION VISIT  Today's visit was # 13  Starting weight: 297 lbs Starting date: 09/10/2017 Today's weight : 292 lbs Today's date: 04/25/2018 Total lbs lost to date: 5   ASK: We discussed the diagnosis of obesity with Jeanne [name] today and Jeanne [name] agreed to give [place] permission to discuss obesity behavioral modification therapy today.  ASSESS: Jeanne [name] has the diagnosis of obesity and her BMI today is 53.39 Vickki is in the action stage of change    ADVISE: Anahla was educated on the multiple health risks of obesity as well as the benefit of weight loss to improve her health. She was advised of the need for long term treatment and the importance of lifestyle modifications to improve her current health and to decrease her risk of future health problems.  AGREE: Multiple dietary modification options and treatment options were discussed and  Tramaine agreed to follow the recommendations documented in the above note.  ARRANGE: Halena was educated on the importance of frequent visits to treat obesity as outlined per CMS and USPSTF guidelines and agreed to schedule her next follow up appointment today.  Corey Skains, am acting as Location manager for [employer], FNP-C.  I have reviewed the above documentation for accuracy and completeness, and I agree with the above.  - Theresea Trautmann, FNP-C.

## 2018-05-01 ENCOUNTER — Encounter (INDEPENDENT_AMBULATORY_CARE_PROVIDER_SITE_OTHER): Payer: Self-pay | Admitting: Family Medicine

## 2018-05-15 ENCOUNTER — Ambulatory Visit (INDEPENDENT_AMBULATORY_CARE_PROVIDER_SITE_OTHER): Payer: BC Managed Care – PPO | Admitting: Family Medicine

## 2018-05-15 ENCOUNTER — Encounter (INDEPENDENT_AMBULATORY_CARE_PROVIDER_SITE_OTHER): Payer: Self-pay | Admitting: Family Medicine

## 2018-05-15 VITALS — BP 127/84 | HR 92 | Temp 97.5°F | Ht 62.0 in | Wt 289.0 lb

## 2018-05-15 DIAGNOSIS — R7303 Prediabetes: Secondary | ICD-10-CM

## 2018-05-15 DIAGNOSIS — E559 Vitamin D deficiency, unspecified: Secondary | ICD-10-CM | POA: Diagnosis not present

## 2018-05-15 DIAGNOSIS — Z6841 Body Mass Index (BMI) 40.0 and over, adult: Secondary | ICD-10-CM

## 2018-05-15 DIAGNOSIS — Z9189 Other specified personal risk factors, not elsewhere classified: Secondary | ICD-10-CM | POA: Diagnosis not present

## 2018-05-15 MED ORDER — LIRAGLUTIDE 18 MG/3ML ~~LOC~~ SOPN
1.2000 mg | PEN_INJECTOR | SUBCUTANEOUS | 0 refills | Status: DC
Start: 2018-05-15 — End: 2018-06-06

## 2018-05-15 MED ORDER — VITAMIN D (ERGOCALCIFEROL) 1.25 MG (50000 UNIT) PO CAPS: 50000.0000 [IU] | ORAL_CAPSULE | ORAL | 0 refills | Status: DC

## 2018-05-20 NOTE — Progress Notes (Signed)
Office: [phone]  /  Fax: [phone]   HPI:   Chief Complaint: OBESITY Jeanne [name] is here to discuss her progress with her obesity treatment plan. She is on the Category 4 plan and is following her eating plan approximately 60% of the time. She states she is exercising 0 minutes 0 times per week. [name] is meeting her protein goal and she is doing some journaling.  Her weight is 289 lb (131.1 kg) today and has had a weight loss of 3 pounds over a period of 3 to 4 weeks since her last visit. She has lost 8 lbs since starting treatment with [place].  Pre-Diabetes [name] has a diagnosis of pre-diabetes based on her elevated Hgb A1c and was informed this puts her at greater risk of developing diabetes. We increased Victoza to 1.2 mg daily last visit.  She had nausea at first but now only intermittent heartburn with overeating. She notes Victoza is causing decrease in hunger cravings and increased satiety. She continues to work on diet and exercise to decrease risk of diabetes. She denies nausea or hypoglycemia. Lab Results  Component Value Date   HGBA1C 6.0 (H) 04/25/2018    At risk for diabetes [name] is at higher than average risk for developing diabetes due to her obesity and pre-diabetes. She currently denies polyuria or polydipsia.  Vitamin D Deficiency [name] has a diagnosis of vitamin D deficiency. She is currently taking prescription Vit D, but level is not at goal. She denies nausea, vomiting or muscle weakness.  ALLERGIES: No Known Allergies  MEDICATIONS: Current Outpatient Medications on File Prior to Visit  Medication Sig Dispense Refill  . albuterol (PROAIR HFA) 108 (90 Base) MCG/ACT inhaler Inhale 2 puffs into the lungs every 6 (six) hours as needed for wheezing or shortness of breath. 8.5 Inhaler 1  . beclomethasone (QVAR) 40 MCG/ACT inhaler Inhale 2 puffs into the lungs 2 (two) times daily. 1 Inhaler 3  . calcium carbonate (TUMS - DOSED IN MG ELEMENTAL CALCIUM) 500 MG  chewable tablet Chew 1 tablet by mouth 3 (three) times daily as needed for indigestion or heartburn.     . diphenhydrAMINE HCl, Sleep, (ZZZQUIL) 25 MG CAPS Take 25 mg by mouth at bedtime as needed (for sleep.).    [name] ibuprofen (ADVIL,MOTRIN) 600 MG tablet Take 1 tablet (600 mg total) by mouth every 6 (six) hours as needed (mild pain). 30 tablet 0  . Insulin Pen Needle (BD PEN NEEDLE NANO 2ND GEN) 32G X 4 MM MISC 1 Package by Does not apply route 2 (two) times daily. 100 each 0  . metoprolol succinate (TOPROL-XL) 50 MG 24 hr tablet Take 1 tablet (50 mg total) by mouth daily. Take with or immediately following a meal. 90 tablet 3  . Polyethyl Glycol-Propyl Glycol (LUBRICANT EYE DROPS) 0.4-0.3 % SOLN Place 1 drop into both eyes 3 (three) times daily as needed (for dry/allergy eyes.).    [name] sertraline (ZOLOFT) 100 MG tablet Take 100 mg by mouth daily.    [name] triamcinolone cream (KENALOG) 0.1 % Apply 1 application topically 2 (two) times daily as needed (for eczema on hands.). 30 g 3   No current facility-administered medications on file prior to visit.     PAST MEDICAL HISTORY: Past Medical History:  Diagnosis Date  . Anxiety   . Asthma   . Dry eyes   . Dry skin   . Eczema   . Fatigue   . Fibroids   . H/O echocardiogram  only showed tachycardia  . Heartburn   . HTN (hypertension)   . Hyperlipidemia   . Palpitations   . Pre-diabetes     pre diabetes, Metformin ordered from weight loss clinic  . Rash in adult   . Shortness of breath    due to allergies  . Stress   . Tachycardia   . Vitamin D deficiency   . Wheezing     PAST SURGICAL HISTORY: Past Surgical History:  Procedure Laterality Date  . EYE SURGERY Bilateral 2006   Lasik  . MYOMECTOMY N/A 10/30/2017   Procedure: ABDOMINAL MYOMECTOMY;  Surgeon: Lavonia Drafts, MD;  Location: [facility];  Service: Gynecology;  Laterality: N/A;    SOCIAL HISTORY: Social History   Tobacco Use  . Smoking status: Never Smoker  .  Smokeless tobacco: Never Used  Substance Use Topics  . Alcohol use: No    Alcohol/week: 0.0 standard drinks  . Drug use: No    FAMILY HISTORY: Family History  Problem Relation Age of Onset  . Thyroid disease Mother   . Fibroids Mother   . Deep vein thrombosis Mother   . Cancer Maternal Grandmother        liver   . Cancer Maternal Grandfather        pancreatic  . Hyperlipidemia Father   . Hypertension Father   . Sleep apnea Father   . Obesity Father   . Cancer Paternal Grandfather        prostate    ROS: Review of Systems  Constitutional: Positive for weight loss.  Gastrointestinal: Positive for heartburn. Negative for nausea and vomiting.  Genitourinary: Negative for frequency.  Musculoskeletal:       Negative muscle weakness  Endo/Heme/Allergies: Negative for polydipsia.       Negative polyphagia    PHYSICAL EXAM: Blood pressure 127/84, pulse 92, temperature (!) 97.5 F (36.4 C), temperature source Oral, height 5\' 2"  (1.575 m), weight 289 lb (131.1 kg), SpO2 97 %. Body mass index is 52.86 kg/m. Physical Exam Vitals signs reviewed.  Constitutional:      Appearance: Normal appearance. She is obese.  Cardiovascular:     Rate and Rhythm: Normal rate.  Pulmonary:     Effort: Pulmonary effort is normal.  Musculoskeletal: Normal range of motion.  Skin:    General: Skin is warm and dry.  Neurological:     Mental Status: She is alert and oriented to person, place, and time.  Psychiatric:        Mood and Affect: Mood normal.        Behavior: Behavior normal.     RECENT LABS AND TESTS: BMET    Component Value Date/Time   NA 137 04/25/2018 1318   K 4.3 04/25/2018 1318   CL 101 04/25/2018 1318   CO2 20 04/25/2018 1318   GLUCOSE 89 04/25/2018 1318   GLUCOSE 93 10/17/2017 1548   BUN 10 04/25/2018 1318   CREATININE 0.56 (L) 04/25/2018 1318   CALCIUM 9.3 04/25/2018 1318   GFRNONAA 121 04/25/2018 1318   GFRAA 140 04/25/2018 1318   Lab Results  Component  Value Date   HGBA1C 6.0 (H) 04/25/2018   HGBA1C 5.6 01/01/2018   HGBA1C 5.9 (H) 09/10/2017   Lab Results  Component Value Date   INSULIN 19.6 04/25/2018   INSULIN 19.2 01/01/2018   INSULIN 12.3 09/10/2017   CBC    Component Value Date/Time   WBC 7.8 04/25/2018 1318   WBC 13.3 (H) 10/31/2017 0516   RBC 4.80  04/25/2018 1318   RBC 4.27 10/31/2017 0516   HGB 14.3 04/25/2018 1318   HCT 43.0 04/25/2018 1318   PLT 363 04/25/2018 1318   MCV 90 04/25/2018 1318   MCH 29.8 04/25/2018 1318   MCH 31.9 10/31/2017 0516   MCHC 33.3 04/25/2018 1318   MCHC 33.8 10/31/2017 0516   RDW 12.6 04/25/2018 1318   LYMPHSABS 2.3 04/25/2018 1318   MONOABS 0.5 07/30/2017 1631   EOSABS 0.3 04/25/2018 1318   BASOSABS 0.1 04/25/2018 1318   Iron/TIBC/Ferritin/ %Sat No results found for: IRON, TIBC, FERRITIN, IRONPCTSAT Lipid Panel     Component Value Date/Time   CHOL 204 (H) 04/25/2018 1318   TRIG 118 04/25/2018 1318   HDL 48 04/25/2018 1318   CHOLHDL 4.3 04/25/2018 1318   CHOLHDL 4 07/03/2017 1609   VLDL 19.6 07/03/2017 1609   LDLCALC 132 (H) 04/25/2018 1318   Hepatic Function Panel     Component Value Date/Time   PROT 6.5 04/25/2018 1318   ALBUMIN 3.9 04/25/2018 1318   AST 15 04/25/2018 1318   ALT 16 04/25/2018 1318   ALKPHOS 86 04/25/2018 1318   BILITOT 0.3 04/25/2018 1318      Component Value Date/Time   TSH 3.340 09/10/2017 1058   TSH 3.55 07/30/2017 1631   TSH 2.28 08/17/2016 1707  Results for SANDRINA, HEATON (MRN [id number]) as of 05/20/2018 17:53  Ref. Range 04/25/2018 13:18  Vitamin D, 25-Hydroxy Latest Ref Range: 30.0 - 100.0 ng/mL 26.9 (L)    ASSESSMENT AND PLAN: Prediabetes - Plan: liraglutide (VICTOZA) 18 MG/3ML SOPN  Vitamin D deficiency - Plan: Vitamin D, Ergocalciferol, (DRISDOL) 1.25 MG (50000 UT) CAPS capsule  At risk for diabetes mellitus  Class 3 severe obesity with serious comorbidity and body mass index (BMI) of 50.0 to 59.9 in adult, unspecified obesity  type [facility])  PLAN:  Pre-Diabetes Darlisha will continue to work on weight loss, exercise, and decreasing simple carbohydrates in her diet to help decrease the risk of diabetes.  Thanvi agrees to continue Victoza 1.2 mg SubQ daily #3 pens and we will refill for 1 month. Kaena agrees to follow up with our clinic in 3 weeks as directed to monitor her progress.  Diabetes risk counseling Mikaella was given extended (15 minutes) diabetes prevention counseling today. She is 37 y.o. female and has risk factors for diabetes including obesity and pre-diabetes. We discussed intensive lifestyle modifications today with an emphasis on weight loss as well as increasing exercise and decreasing simple carbohydrates in her diet.  Vitamin D Deficiency Karolyn was informed that low vitamin D levels contributes to fatigue and are associated with obesity, breast, and colon cancer. Prisilla agrees to increase prescription Vit D @50 ,000 IU to every 3 days #10 with no refills. She will follow up for routine testing of vitamin D, at least 2-3 times per year. She was informed of the risk of over-replacement of vitamin D and agrees to not increase her dose unless she discusses this with [place] first. Charlen agrees to follow up with our clinic in 3 weeks.  Obesity Brocha is currently in the action stage of change. As such, her goal is to continue with weight loss efforts She has agreed to follow the Category 4 plan with breakfast options Kimblery has not been prescribed exercise at this time. We discussed the following Behavioral Modification Strategies today: increasing lean protein intake, holiday eating strategies, and planning for success    Loray has agreed to follow up with our clinic in 3 weeks.  She was informed of the importance of frequent follow up visits to maximize her success with intensive lifestyle modifications for her multiple health conditions.   OBESITY BEHAVIORAL INTERVENTION VISIT  Today's visit was # 14    Starting weight: 297 lbs Starting date: 09/10/17 Today's weight : 289 lbs  Today's date: 05/15/2018 Total lbs lost to date: 8    ASK: We discussed the diagnosis of obesity with [name] today and Dylana agreed to give [place] permission to discuss obesity behavioral modification therapy today.  ASSESS: Dianelly has the diagnosis of obesity and her BMI today is 52.85 Karri is in the action stage of change   ADVISE: Porfiria was educated on the multiple health risks of obesity as well as the benefit of weight loss to improve her health. She was advised of the need for long term treatment and the importance of lifestyle modifications to improve her current health and to decrease her risk of future health problems.  AGREE: Multiple dietary modification options and treatment options were discussed and  Marcina agreed to follow the recommendations documented in the above note.  ARRANGE: Vaeda was educated on the importance of frequent visits to treat obesity as outlined per CMS and USPSTF guidelines and agreed to schedule her next follow up appointment today.  Wilhemena Durie, am acting as Location manager for [employer], FNP-C.  I have reviewed the above documentation for accuracy and completeness, and I agree with the above.  - Arrian Manson, FNP-C.

## 2018-05-21 ENCOUNTER — Encounter (INDEPENDENT_AMBULATORY_CARE_PROVIDER_SITE_OTHER): Payer: Self-pay | Admitting: Family Medicine

## 2018-06-06 ENCOUNTER — Ambulatory Visit (INDEPENDENT_AMBULATORY_CARE_PROVIDER_SITE_OTHER): Payer: BC Managed Care – PPO | Admitting: Family Medicine

## 2018-06-06 ENCOUNTER — Encounter (INDEPENDENT_AMBULATORY_CARE_PROVIDER_SITE_OTHER): Payer: Self-pay | Admitting: Family Medicine

## 2018-06-06 VITALS — BP 133/87 | HR 90 | Temp 97.9°F | Ht 62.0 in | Wt 294.0 lb

## 2018-06-06 DIAGNOSIS — R7303 Prediabetes: Secondary | ICD-10-CM

## 2018-06-06 DIAGNOSIS — Z9189 Other specified personal risk factors, not elsewhere classified: Secondary | ICD-10-CM

## 2018-06-06 DIAGNOSIS — E559 Vitamin D deficiency, unspecified: Secondary | ICD-10-CM | POA: Diagnosis not present

## 2018-06-06 DIAGNOSIS — Z6841 Body Mass Index (BMI) 40.0 and over, adult: Secondary | ICD-10-CM

## 2018-06-06 MED ORDER — LIRAGLUTIDE 18 MG/3ML ~~LOC~~ SOPN: 1.2000 mg | PEN_INJECTOR | SUBCUTANEOUS | 0 refills | Status: DC

## 2018-06-06 MED ORDER — VITAMIN D (ERGOCALCIFEROL) 1.25 MG (50000 UNIT) PO CAPS: 50000.0000 [IU] | ORAL_CAPSULE | ORAL | 0 refills | Status: DC

## 2018-06-07 ENCOUNTER — Ambulatory Visit (INDEPENDENT_AMBULATORY_CARE_PROVIDER_SITE_OTHER): Payer: BC Managed Care – PPO | Admitting: Family Medicine

## 2018-06-07 ENCOUNTER — Other Ambulatory Visit (HOSPITAL_COMMUNITY)
Admission: RE | Admit: 2018-06-07 | Discharge: 2018-06-07 | Disposition: A | Discharge status: Home / Self Care | Payer: BC Managed Care – PPO | Source: Ambulatory Visit | Attending: Family Medicine | Admitting: Family Medicine

## 2018-06-07 ENCOUNTER — Encounter: Payer: Self-pay | Admitting: Family Medicine

## 2018-06-07 VITALS — BP 116/80 | HR 97 | Temp 97.9°F | Resp 16 | Ht 61.8 in | Wt 297.0 lb

## 2018-06-07 DIAGNOSIS — R000 Tachycardia, unspecified: Secondary | ICD-10-CM

## 2018-06-07 DIAGNOSIS — Z0000 Encounter for general adult medical examination without abnormal findings: Secondary | ICD-10-CM

## 2018-06-07 DIAGNOSIS — J4520 Mild intermittent asthma, uncomplicated: Secondary | ICD-10-CM

## 2018-06-07 DIAGNOSIS — I10 Essential (primary) hypertension: Secondary | ICD-10-CM | POA: Diagnosis not present

## 2018-06-07 DIAGNOSIS — L309 Dermatitis, unspecified: Secondary | ICD-10-CM

## 2018-06-07 MED ORDER — TRIAMCINOLONE ACETONIDE 0.1 % EX CREA: 1.0000 "application " | TOPICAL_CREAM | Freq: Two times a day (BID) | CUTANEOUS | 3 refills | Status: AC | PRN

## 2018-06-07 MED ORDER — BECLOMETHASONE DIPROPIONATE 40 MCG/ACT IN AERS: 2.0000 | INHALATION_SPRAY | Freq: Two times a day (BID) | RESPIRATORY_TRACT | 3 refills | Status: DC

## 2018-06-07 MED ORDER — METOPROLOL SUCCINATE ER 50 MG PO TB24: 50.0000 mg | ORAL_TABLET | Freq: Every day | ORAL | 3 refills | Status: DC

## 2018-06-07 NOTE — Patient Instructions (Signed)
Preventive Care 18-39 Years, Female Preventive care refers to lifestyle choices and visits with your health care provider that can promote health and wellness. What does preventive care include?   A yearly physical exam. This is also called an annual well check.  Dental exams once or twice a year.  Routine eye exams. Ask your health care provider how often you should have your eyes checked.  Personal lifestyle choices, including: ? Daily care of your teeth and gums. ? Regular physical activity. ? Eating a healthy diet. ? Avoiding tobacco and drug use. ? Limiting alcohol use. ? Practicing safe sex. ? Taking vitamin and mineral supplements as recommended by your health care provider. What happens during an annual well check? The services and screenings done by your health care provider during your annual well check will depend on your age, overall health, lifestyle risk factors, and family history of disease. Counseling Your health care provider may ask you questions about your:  Alcohol use.  Tobacco use.  Drug use.  Emotional well-being.  Home and relationship well-being.  Sexual activity.  Eating habits.  Work and work environment.  Method of birth control.  Menstrual cycle.  Pregnancy history. Screening You may have the following tests or measurements:  Height, weight, and BMI.  Diabetes screening. This is done by checking your blood sugar (glucose) after you have not eaten for a while (fasting).  Blood pressure.  Lipid and cholesterol levels. These may be checked every 5 years starting at age 20.  Skin check.  Hepatitis C blood test.  Hepatitis B blood test.  Sexually transmitted disease (STD) testing.  BRCA-related cancer screening. This may be done if you have a family history of breast, ovarian, tubal, or peritoneal cancers.  Pelvic exam and Pap test. This may be done every 3 years starting at age 21. Starting at age 30, this may be done every 5  years if you have a Pap test in combination with an HPV test. Discuss your test results, treatment options, and if necessary, the need for more tests with your health care provider. Vaccines Your health care provider may recommend certain vaccines, such as:  Influenza vaccine. This is recommended every year.  Tetanus, diphtheria, and acellular pertussis (Tdap, Td) vaccine. You may need a Td booster every 10 years.  Varicella vaccine. You may need this if you have not been vaccinated.  HPV vaccine. If you are 26 or younger, you may need three doses over 6 months.  Measles, mumps, and rubella (MMR) vaccine. You may need at least one dose of MMR. You may also need a second dose.  Pneumococcal 13-valent conjugate (PCV13) vaccine. You may need this if you have certain conditions and were not previously vaccinated.  Pneumococcal polysaccharide (PPSV23) vaccine. You may need one or two doses if you smoke cigarettes or if you have certain conditions.  Meningococcal vaccine. One dose is recommended if you are age 19-21 years and a first-year college student living in a residence hall, or if you have one of several medical conditions. You may also need additional booster doses.  Hepatitis A vaccine. You may need this if you have certain conditions or if you travel or work in places where you may be exposed to hepatitis A.  Hepatitis B vaccine. You may need this if you have certain conditions or if you travel or work in places where you may be exposed to hepatitis B.  Haemophilus influenzae type b (Hib) vaccine. You may need this if you   have certain risk factors. Talk to your health care provider about which screenings and vaccines you need and how often you need them. This information is not intended to replace advice given to you by your health care provider. Make sure you discuss any questions you have with your health care provider. Document Released: 07/18/2001 Document Revised: 01/02/2017  Document Reviewed: 03/23/2015 Elsevier Interactive Patient Education  2019 [employer].

## 2018-06-07 NOTE — Progress Notes (Signed)
Subjective:     Jeanne [name] is a 38 y.o. female and is here for a comprehensive physical exam. The patient reports no problems.  Social History   Socioeconomic History  . Marital status: Married    Spouse name: [name]  . Number of children: Not on file  . Years of education: Not on file  . Highest education level: Not on file  Occupational History  . Occupation: Product manager: [employer]    Comment: 4 th grade  Social Needs  . Financial resource strain: Not on file  . Food insecurity:    Worry: Not on file    Inability: Not on file  . Transportation needs:    Medical: Not on file    Non-medical: Not on file  Tobacco Use  . Smoking status: Never Smoker  . Smokeless tobacco: Never Used  Substance and Sexual Activity  . Alcohol use: No    Alcohol/week: 0.0 standard drinks  . Drug use: No  . Sexual activity: Yes    Partners: Male    Birth control/protection: Pill  Lifestyle  . Physical activity:    Days per week: Not on file    Minutes per session: Not on file  . Stress: Not on file  Relationships  . Social connections:    Talks on phone: Not on file    Gets together: Not on file    Attends religious service: Not on file    Active member of club or organization: Not on file    Attends meetings of clubs or organizations: Not on file    Relationship status: Not on file  . Intimate partner violence:    Fear of current or ex partner: Not on file    Emotionally abused: Not on file    Physically abused: Not on file    Forced sexual activity: Not on file  Other Topics Concern  . Not on file  Social History Narrative   Exercise--- walk in summer--- its been difficult during school year   Health Maintenance  Topic Date Due  . HIV Screening  11/20/1997  . PAP SMEAR-Modifier  03/31/2017  . TETANUS/TDAP  10/20/2024  . INFLUENZA VACCINE  Completed    The following portions of the patient's history were reviewed and updated as appropriate:  She   has a past medical history of Anxiety, Asthma, Dry eyes, Dry skin, Eczema, Fatigue, Fibroids, H/O echocardiogram, Heartburn, HTN (hypertension), Hyperlipidemia, Palpitations, Pre-diabetes, Rash in adult, Shortness of breath, Stress, Tachycardia, Vitamin D deficiency, and Wheezing. She does not have any pertinent problems on file. She  has a past surgical history that includes Eye surgery (Bilateral, 2006) and Myomectomy (N/A, 10/30/2017). Her family history includes Cancer in her maternal grandfather, maternal grandmother, and paternal grandfather; Deep vein thrombosis in her mother; Fibroids in her mother; Hyperlipidemia in her father; Hypertension in her father; Obesity in her father; Sleep apnea in her father; Thyroid disease in her mother. She  reports that she has never smoked. She has never used smokeless tobacco. She reports that she does not drink alcohol or use drugs. She has a current medication list which includes the following prescription(s): albuterol, beclomethasone, calcium carbonate, diphenhydramine hcl (sleep), ibuprofen, insulin pen needle, liraglutide, metoprolol succinate, polyethyl glycol-propyl glycol, sertraline, triamcinolone cream, and vitamin d (ergocalciferol). Current Outpatient Medications on File Prior to Visit  Medication Sig Dispense Refill  . albuterol (PROAIR HFA) 108 (90 Base) MCG/ACT inhaler Inhale 2 puffs into the lungs every 6 (six)  hours as needed for wheezing or shortness of breath. 8.5 Inhaler 1  . calcium carbonate (TUMS - DOSED IN MG ELEMENTAL CALCIUM) 500 MG chewable tablet Chew 1 tablet by mouth 3 (three) times daily as needed for indigestion or heartburn.     . diphenhydrAMINE HCl, Sleep, (ZZZQUIL) 25 MG CAPS Take 25 mg by mouth at bedtime as needed (for sleep.).    [name] ibuprofen (ADVIL,MOTRIN) 600 MG tablet Take 1 tablet (600 mg total) by mouth every 6 (six) hours as needed (mild pain). 30 tablet 0  . Insulin Pen Needle (BD PEN NEEDLE NANO 2ND GEN) 32G X 4 MM  MISC 1 Package by Does not apply route 2 (two) times daily. 100 each 0  . liraglutide (VICTOZA) 18 MG/3ML SOPN Inject 0.2 mLs (1.2 mg total) into the skin every morning. 3 pen 0  . Polyethyl Glycol-Propyl Glycol (LUBRICANT EYE DROPS) 0.4-0.3 % SOLN Place 1 drop into both eyes 3 (three) times daily as needed (for dry/allergy eyes.).    [name] sertraline (ZOLOFT) 100 MG tablet Take 100 mg by mouth daily.    . Vitamin D, Ergocalciferol, (DRISDOL) 1.25 MG (50000 UT) CAPS capsule Take 1 capsule (50,000 Units total) by mouth every 3 (three) days. 10 capsule 0   No current facility-administered medications on file prior to visit.    She has No Known Allergies..  Review of Systems Review of Systems  Constitutional: Negative for activity change, appetite change and fatigue.  HENT: Negative for hearing loss, congestion, tinnitus and ear discharge.  dentist q35m Eyes: Negative for visual disturbance (see optho q1y -- vision corrected to 20/20 with glasses).  Respiratory: Negative for cough, chest tightness and shortness of breath.   Cardiovascular: Negative for chest pain, palpitations and leg swelling.  Gastrointestinal: Negative for abdominal pain, diarrhea, constipation and abdominal distention.  Genitourinary: Negative for urgency, frequency, decreased urine volume and difficulty urinating.  Musculoskeletal: Negative for back pain, arthralgias and gait problem.  Skin: Negative for color change, pallor and rash.  Neurological: Negative for dizziness, light-headedness, numbness and headaches.  Hematological: Negative for adenopathy. Does not bruise/bleed easily.  Psychiatric/Behavioral: Negative for suicidal ideas, confusion, sleep disturbance, self-injury, dysphoric mood, decreased concentration and agitation.       Objective:    BP 116/80 (BP Location: Left Arm, Cuff Size: Large)   Pulse 97   Temp 97.9 F (36.6 C) (Oral)   Resp 16   Ht 5' 1.8" (1.57 m)   Wt 297 lb (134.7 kg)   LMP 06/01/2018    SpO2 96%   BMI 54.67 kg/m  General appearance: alert, cooperative, appears stated age and no distress Head: Normocephalic, without obvious abnormality, atraumatic Eyes: negative findings: lids and lashes normal and conjunctivae and sclerae normal Ears: normal TM's and external ear canals both ears Nose: Nares normal. Septum midline. Mucosa normal. No drainage or sinus tenderness. Throat: lips, mucosa, and tongue normal; teeth and gums normal Neck: no adenopathy, no carotid bruit, no JVD, supple, symmetrical, trachea midline and thyroid not enlarged, symmetric, no tenderness/mass/nodules Back: symmetric, no curvature. ROM normal. No CVA tenderness. Lungs: clear to auscultation bilaterally Breasts: normal appearance, no masses or tenderness Heart: regular rate and rhythm, S1, S2 normal, no murmur, click, rub or gallop Abdomen: soft, non-tender; bowel sounds normal; no masses,  no organomegaly Pelvic: cervix normal in appearance, external genitalia normal, no adnexal masses or tenderness, no cervical motion tenderness, rectovaginal septum normal, uterus normal size, shape, and consistency, vagina normal without discharge and on menses Extremities:  extremities normal, atraumatic, no cyanosis or edema Pulses: 2+ and symmetric Skin: Skin color, texture, turgor normal. No rashes or lesions Lymph nodes: Cervical, supraclavicular, and axillary nodes normal. Neurologic: Alert and oriented X 3, normal strength and tone. Normal symmetric reflexes. Normal coordination and gait    Assessment:    Healthy female exam.     Plan:      ghm utd Check labs  See After Visit Summary for Counseling Recommendations     1. Mild intermittent asthma without complication stable - beclomethasone (QVAR) 40 MCG/ACT inhaler; Inhale 2 puffs into the lungs 2 (two) times daily.  Dispense: 1 Inhaler; Refill: 3  2. Essential hypertension Well controlled, no changes to meds. Encouraged heart healthy diet such as  the DASH diet and exercise as tolerated.  - metoprolol succinate (TOPROL-XL) 50 MG 24 hr tablet; Take 1 tablet (50 mg total) by mouth daily. Take with or immediately following a meal.  Dispense: 90 tablet; Refill: 3  3. Sinus tachycardia Stable  - metoprolol succinate (TOPROL-XL) 50 MG 24 hr tablet; Take 1 tablet (50 mg total) by mouth daily. Take with or immediately following a meal.  Dispense: 90 tablet; Refill: 3  4. Eczema, unspecified type  - triamcinolone cream (KENALOG) 0.1 %; Apply 1 application topically 2 (two) times daily as needed (for eczema on hands.).  Dispense: 30 g; Refill: 3  5. Preventative health care See above  - MM DIGITAL SCREENING BILATERAL; Future

## 2018-06-10 ENCOUNTER — Encounter (INDEPENDENT_AMBULATORY_CARE_PROVIDER_SITE_OTHER): Payer: Self-pay | Admitting: Family Medicine

## 2018-06-10 DIAGNOSIS — Z6841 Body Mass Index (BMI) 40.0 and over, adult: Secondary | ICD-10-CM

## 2018-06-10 DIAGNOSIS — R7303 Prediabetes: Secondary | ICD-10-CM | POA: Insufficient documentation

## 2018-06-10 DIAGNOSIS — E559 Vitamin D deficiency, unspecified: Secondary | ICD-10-CM | POA: Insufficient documentation

## 2018-06-10 NOTE — Progress Notes (Signed)
Office: [phone]  /  Fax: [phone]   HPI:   Chief Complaint: OBESITY Jeanne [name] is here to discuss her progress with her obesity treatment plan. She is on the Category 4 plan with breakfast options and is following her eating plan approximately 0 % of the time. She states she is exercising 0 minutes 0 times per week. Jeanne [name] has been traveling for the holidays. She has been off track. Jeanne [name] is reading CBT book to help with her motivation. She admits to self sabotage at times. Her weight is 294 lb (133.4 kg) today and has had a weight gain of 5 pounds over a period of 3 weeks since her last visit. She has lost 3 lbs since starting treatment with [place].  Pre-Diabetes Jeanne [name] has a diagnosis of prediabetes based on her elevated Hgb A1c and was informed this puts her at greater risk of developing diabetes. Jeanne [name] is taking victoza currently and she denies any nausea or vomiting. She does admit to mild constipation and heartburn. Jeanne [name] continues to work on diet and exercise to decrease risk of diabetes. She denies admits to cravings and denies polyphagia or hypoglycemia.  At risk for diabetes Jeanne [name] is at higher than average risk for developing diabetes due to her obesity and prediabetes. She currently denies polyuria or polydipsia.  Vitamin D deficiency Jeanne [name] has a diagnosis of vitamin D deficiency. Jeanne [name] is currently taking vit D and denies nausea, vomiting or muscle weakness. Her last vitamin D level was at 26.9 on 04/25/18 and is not at goal.  ASSESSMENT AND PLAN:  Prediabetes - Plan: liraglutide (VICTOZA) 18 MG/3ML SOPN  Vitamin D deficiency - Plan: Vitamin D, Ergocalciferol, (DRISDOL) 1.25 MG (50000 UT) CAPS capsule  At risk for diabetes mellitus  Class 3 severe obesity with serious comorbidity and body mass index (BMI) of 50.0 to 59.9 in adult, unspecified obesity type [facility])  PLAN:  Pre-Diabetes Jeanne [name] will continue to work on weight loss, exercise, and decreasing simple  carbohydrates in her diet to help decrease the risk of diabetes. She was informed that eating too many simple carbohydrates or too many calories at one sitting increases the likelihood of GI side effects. Jeanne [name] agreed to continue victoza 1.2 mg daily #3 pens with no refills and follow up with [place] as directed to monitor her progress.  Diabetes risk counseling Jeanne [name] was given extended (15 minutes) diabetes prevention counseling today. She is 38 y.o. female and has risk factors for diabetes including obesity and prediabetes. We discussed intensive lifestyle modifications today with an emphasis on weight loss as well as increasing exercise and decreasing simple carbohydrates in her diet.  Vitamin D Deficiency Jeanne [name] was informed that low vitamin D levels contributes to fatigue and are associated with obesity, breast, and colon cancer. She agrees to continue to take prescription Vit D @50 ,000 IU every 3 days #10 with no refills and will follow up for routine testing of vitamin D, at least 2-3 times per year. She was informed of the risk of over-replacement of vitamin D and agrees to not increase her dose unless she discusses this with [place] first. Jeanne [name] agrees to follow up as directed.  Obesity Jeanne [name] is currently in the action stage of change. As such, her goal is to continue with weight loss efforts She has agreed to follow the Category 4 plan with breakfast options Jeanne [name] has not been prescribed exercise at this time. We discussed the following Behavioral Modification Strategies today: planning for success and keeping healthy foods in the home.  Jeanne [name] has agreed to follow up with our clinic in 2 to 3 weeks. She was informed of the importance of frequent follow up visits to maximize her success with intensive lifestyle modifications for her multiple health conditions.  ALLERGIES: No Known Allergies  MEDICATIONS: Current Outpatient Medications on File Prior to Visit  Medication Sig Dispense Refill    . albuterol (PROAIR HFA) 108 (90 Base) MCG/ACT inhaler Inhale 2 puffs into the lungs every 6 (six) hours as needed for wheezing or shortness of breath. 8.5 Inhaler 1  . calcium carbonate (TUMS - DOSED IN MG ELEMENTAL CALCIUM) 500 MG chewable tablet Chew 1 tablet by mouth 3 (three) times daily as needed for indigestion or heartburn.     . diphenhydrAMINE HCl, Sleep, (ZZZQUIL) 25 MG CAPS Take 25 mg by mouth at bedtime as needed (for sleep.).    [name] ibuprofen (ADVIL,MOTRIN) 600 MG tablet Take 1 tablet (600 mg total) by mouth every 6 (six) hours as needed (mild pain). 30 tablet 0  . Insulin Pen Needle (BD PEN NEEDLE NANO 2ND GEN) 32G X 4 MM MISC 1 Package by Does not apply route 2 (two) times daily. 100 each 0  . Polyethyl Glycol-Propyl Glycol (LUBRICANT EYE DROPS) 0.4-0.3 % SOLN Place 1 drop into both eyes 3 (three) times daily as needed (for dry/allergy eyes.).    [name] sertraline (ZOLOFT) 100 MG tablet Take 100 mg by mouth daily.     No current facility-administered medications on file prior to visit.     PAST MEDICAL HISTORY: Past Medical History:  Diagnosis Date  . Anxiety   . Asthma   . Dry eyes   . Dry skin   . Eczema   . Fatigue   . Fibroids   . H/O echocardiogram    only showed tachycardia  . Heartburn   . HTN (hypertension)   . Hyperlipidemia   . Palpitations   . Pre-diabetes     pre diabetes, Metformin ordered from weight loss clinic  . Rash in adult   . Shortness of breath    due to allergies  . Stress   . Tachycardia   . Vitamin D deficiency   . Wheezing     PAST SURGICAL HISTORY: Past Surgical History:  Procedure Laterality Date  . EYE SURGERY Bilateral 2006   Lasik  . MYOMECTOMY N/A 10/30/2017   Procedure: ABDOMINAL MYOMECTOMY;  Surgeon: Lavonia Drafts, MD;  Location: [facility];  Service: Gynecology;  Laterality: N/A;    SOCIAL HISTORY: Social History   Tobacco Use  . Smoking status: Never Smoker  . Smokeless tobacco: Never Used  Substance Use Topics   . Alcohol use: No    Alcohol/week: 0.0 standard drinks  . Drug use: No    FAMILY HISTORY: Family History  Problem Relation [name] of Onset  . Thyroid disease Mother   . Fibroids Mother   . Deep vein thrombosis Mother   . Cancer Maternal Grandmother        liver   . Cancer Maternal Grandfather        pancreatic  . Hyperlipidemia Father   . Hypertension Father   . Sleep apnea Father   . Obesity Father   . Cancer Paternal Grandfather        prostate    ROS: Review of Systems  Constitutional: Negative for weight loss.  Gastrointestinal: Positive for constipation and heartburn. Negative for nausea and vomiting.  Genitourinary: Negative for frequency.  Musculoskeletal:       Negative for  muscle weakness  Endo/Heme/Allergies: Negative for polydipsia.       Negative for hypoglycemia Negative for polyphagia Positive for cravings    PHYSICAL EXAM: Blood pressure 133/87, pulse 90, temperature 97.9 F (36.6 C), temperature source Oral, height 5\' 2"  (1.575 m), weight 294 lb (133.4 kg), last menstrual period 06/01/2018, SpO2 98 %. Body mass index is 53.77 kg/m. Physical Exam Vitals signs reviewed.  Constitutional:      Appearance: Normal appearance. She is well-developed. She is obese.  Cardiovascular:     Rate and Rhythm: Normal rate.  Pulmonary:     Effort: Pulmonary effort is normal.  Musculoskeletal: Normal range of motion.  Skin:    General: Skin is warm and dry.  Neurological:     Mental Status: She is alert and oriented to person, place, and time.  Psychiatric:        Mood and Affect: Mood normal.        Behavior: Behavior normal.     RECENT LABS AND TESTS: BMET    Component Value Date/Time   NA 137 04/25/2018 1318   K 4.3 04/25/2018 1318   CL 101 04/25/2018 1318   CO2 20 04/25/2018 1318   GLUCOSE 89 04/25/2018 1318   GLUCOSE 93 10/17/2017 1548   BUN 10 04/25/2018 1318   CREATININE 0.56 (L) 04/25/2018 1318   CALCIUM 9.3 04/25/2018 1318   GFRNONAA 121  04/25/2018 1318   GFRAA 140 04/25/2018 1318   Lab Results  Component Value Date   HGBA1C 6.0 (H) 04/25/2018   HGBA1C 5.6 01/01/2018   HGBA1C 5.9 (H) 09/10/2017   Lab Results  Component Value Date   INSULIN 19.6 04/25/2018   INSULIN 19.2 01/01/2018   INSULIN 12.3 09/10/2017   CBC    Component Value Date/Time   WBC 7.8 04/25/2018 1318   WBC 13.3 (H) 10/31/2017 0516   RBC 4.80 04/25/2018 1318   RBC 4.27 10/31/2017 0516   HGB 14.3 04/25/2018 1318   HCT 43.0 04/25/2018 1318   PLT 363 04/25/2018 1318   MCV 90 04/25/2018 1318   MCH 29.8 04/25/2018 1318   MCH 31.9 10/31/2017 0516   MCHC 33.3 04/25/2018 1318   MCHC 33.8 10/31/2017 0516   RDW 12.6 04/25/2018 1318   LYMPHSABS 2.3 04/25/2018 1318   MONOABS 0.5 07/30/2017 1631   EOSABS 0.3 04/25/2018 1318   BASOSABS 0.1 04/25/2018 1318   Iron/TIBC/Ferritin/ %Sat No results found for: IRON, TIBC, FERRITIN, IRONPCTSAT Lipid Panel     Component Value Date/Time   CHOL 204 (H) 04/25/2018 1318   TRIG 118 04/25/2018 1318   HDL 48 04/25/2018 1318   CHOLHDL 4.3 04/25/2018 1318   CHOLHDL 4 07/03/2017 1609   VLDL 19.6 07/03/2017 1609   LDLCALC 132 (H) 04/25/2018 1318   Hepatic Function Panel     Component Value Date/Time   PROT 6.5 04/25/2018 1318   ALBUMIN 3.9 04/25/2018 1318   AST 15 04/25/2018 1318   ALT 16 04/25/2018 1318   ALKPHOS 86 04/25/2018 1318   BILITOT 0.3 04/25/2018 1318      Component Value Date/Time   TSH 3.340 09/10/2017 1058   TSH 3.55 07/30/2017 1631   TSH 2.28 08/17/2016 1707    Ref. Range 04/25/2018 13:18  Vitamin D, 25-Hydroxy Latest Ref Range: 30.0 - 100.0 ng/mL 26.9 (L)     OBESITY BEHAVIORAL INTERVENTION VISIT  Today's visit was # 14   Starting weight: 297 lbs Starting date: 09/10/2017 Today's weight : 294 lbs Today's date: 06/06/2018 Total lbs  lost to date: 3   ASK: We discussed the diagnosis of obesity with Jeanne [name] today and Jeanne [name] agreed to give [place] permission to discuss  obesity behavioral modification therapy today.  ASSESS: Jeanne [name] has the diagnosis of obesity and her BMI today is 53.76 Farzana is in the action stage of change   ADVISE: Kenitha was educated on the multiple health risks of obesity as well as the benefit of weight loss to improve her health. She was advised of the need for long term treatment and the importance of lifestyle modifications to improve her current health and to decrease her risk of future health problems.  AGREE: Multiple dietary modification options and treatment options were discussed and  Jameshia agreed to follow the recommendations documented in the above note.  ARRANGE: Jeanne [name] was educated on the importance of frequent visits to treat obesity as outlined per CMS and USPSTF guidelines and agreed to schedule her next follow up appointment today.  Corey Skains, am acting as Location manager for [employer], FNP-C.  I have reviewed the above documentation for accuracy and completeness, and I agree with the above.  - Flo Berroa, FNP-C.

## 2018-06-11 ENCOUNTER — Inpatient Hospital Stay (HOSPITAL_BASED_OUTPATIENT_CLINIC_OR_DEPARTMENT_OTHER): Admission: RE | Admit: 2018-06-11 | Payer: Self-pay | Source: Ambulatory Visit

## 2018-06-11 LAB — CYTOLOGY - PAP
Diagnosis: NEGATIVE
HPV: NOT DETECTED

## 2018-06-17 ENCOUNTER — Other Ambulatory Visit: Payer: Self-pay

## 2018-06-17 MED ORDER — NORETHIN ACE-ETH ESTRAD-FE 1-20 MG-MCG PO TABS: 1.0000 | ORAL_TABLET | Freq: Every day | ORAL | 1 refills | Status: DC

## 2018-06-24 ENCOUNTER — Encounter (INDEPENDENT_AMBULATORY_CARE_PROVIDER_SITE_OTHER): Payer: Self-pay | Admitting: Family Medicine

## 2018-06-24 ENCOUNTER — Ambulatory Visit (INDEPENDENT_AMBULATORY_CARE_PROVIDER_SITE_OTHER): Payer: BC Managed Care – PPO | Admitting: Family Medicine

## 2018-06-24 VITALS — BP 118/77 | HR 79 | Temp 98.6°F | Ht 62.0 in | Wt 292.0 lb

## 2018-06-24 DIAGNOSIS — F3289 Other specified depressive episodes: Secondary | ICD-10-CM | POA: Diagnosis not present

## 2018-06-24 DIAGNOSIS — R7303 Prediabetes: Secondary | ICD-10-CM

## 2018-06-24 DIAGNOSIS — Z9189 Other specified personal risk factors, not elsewhere classified: Secondary | ICD-10-CM

## 2018-06-24 DIAGNOSIS — E559 Vitamin D deficiency, unspecified: Secondary | ICD-10-CM | POA: Diagnosis not present

## 2018-06-24 DIAGNOSIS — Z6841 Body Mass Index (BMI) 40.0 and over, adult: Secondary | ICD-10-CM

## 2018-06-24 MED ORDER — VITAMIN D (ERGOCALCIFEROL) 1.25 MG (50000 UNIT) PO CAPS: 50000.0000 [IU] | ORAL_CAPSULE | ORAL | 0 refills | Status: DC

## 2018-06-24 MED ORDER — LIRAGLUTIDE 18 MG/3ML ~~LOC~~ SOPN: 1.2000 mg | PEN_INJECTOR | SUBCUTANEOUS | 0 refills | Status: DC

## 2018-06-25 ENCOUNTER — Encounter (INDEPENDENT_AMBULATORY_CARE_PROVIDER_SITE_OTHER): Payer: Self-pay | Admitting: Family Medicine

## 2018-06-25 MED ORDER — BUPROPION HCL ER (SR) 150 MG PO TB12: 150.0000 mg | ORAL_TABLET | Freq: Every day | ORAL | 0 refills | Status: DC

## 2018-06-25 NOTE — Progress Notes (Signed)
Office: [phone]  /  Fax: [phone]   HPI:   Chief Complaint: OBESITY Malia is here to discuss her progress with her obesity treatment plan. She is on the Category 4 plan with breakfast options and is following her eating plan approximately 60 % of the time. She states she is exercising 0 minutes 0 times per week. Shaelyn is not sticking to the plan as well toward the end of the week when she is tired. She does not always eat all of the protein on the plan. Her weight is 292 lb (132.5 kg) today and has had a weight loss of 2 pounds over a period of 2 to 3 weeks since her last visit. She has lost 5 lbs since starting treatment with [place].  Vitamin D deficiency Janese has a diagnosis of vitamin D deficiency. Her last vitamin D level was at 26.9 on 04/25/18 and was not at goal. She is currently taking vit D and denies nausea, vomiting or muscle weakness.  At risk for osteopenia and osteoporosis Chantel is at higher risk of osteopenia and osteoporosis due to vitamin D deficiency.   Pre-Diabetes Rhona has a diagnosis of prediabetes based on her elevated Hgb A1c and was informed this puts her at greater risk of developing diabetes. Her last A1c was at 6.0 on 04/25/18. Minda is on Victoza 1.2 mg daily and she continues to work on diet and exercise to decrease risk of diabetes. She denies hypoglycemia or polyphagia.  Depression with emotional eating behaviors Natarsha tends to get off track and does more emotional eating toward the end of the week. She tends to eat more when she is stressed from work, fatigued or feeling overwhelmed. 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  She shows no sign of suicidal or homicidal ideations.  Depression screen [facility] 2/9 09/10/2017 03/31/2016  Decreased Interest 3 0  Down, Depressed, Hopeless 2 0  PHQ - 2 Score 5 0  Altered sleeping 2 -  Tired, decreased energy 3 -  Change in appetite 3 -  Feeling bad or failure about yourself  3 -  Trouble concentrating  1 -  Moving slowly or fidgety/restless 1 -  Suicidal thoughts 0 -  PHQ-9 Score 18 -  Difficult doing work/chores Very difficult -      ASSESSMENT AND PLAN:  Vitamin D deficiency - Plan: Vitamin D, Ergocalciferol, (DRISDOL) 1.25 MG (50000 UT) CAPS capsule  Prediabetes - Plan: liraglutide (VICTOZA) 18 MG/3ML SOPN  Other depression - with emotional eating   At risk for osteoporosis  Class 3 severe obesity with serious comorbidity and body mass index (BMI) of 50.0 to 59.9 in adult, unspecified obesity type ([place])  PLAN:  Vitamin D Deficiency Ralynn was informed that low vitamin D levels contributes to fatigue and are associated with obesity, breast, and colon cancer. She agrees to continue to take prescription Vit D @50 ,000 IU every 3 days #10 with no refills and will follow up for routine testing of vitamin D, at least 2-3 times per year. She was informed of the risk of over-replacement of vitamin D and agrees to not increase her dose unless she discusses this with [place] first. Siniyah agrees to follow up as directed.  At risk for osteopenia and osteoporosis Nitza was given extended  (15 minutes) osteoporosis prevention counseling today. Honore is at risk for osteopenia and osteoporosis due to her vitamin D deficiency. She was encouraged to take her vitamin D and follow her higher calcium diet and  increase strengthening exercise to help strengthen her bones and decrease her risk of osteopenia and osteoporosis.  Pre-Diabetes Salene will continue to work on weight loss, exercise, and decreasing simple carbohydrates in her diet to help decrease the risk of diabetes. We dicussed victoza including benefits and risks. She was informed that eating too many simple carbohydrates or too many calories at one sitting increases the likelihood of GI side effects. Merian agreed to continue victoza 1.2 mg daily #2 pens with no refills and follow up with [place] as directed to monitor her progress.  Depression  with Emotional Eating Behaviors We discussed behavior modification techniques today to help Aayana deal with her emotional eating and depression. She has agreed to take Wellbutrin SR 150 mg qAM #30 with no refills and follow up as directed. We will refer to Dr. Mallie Mussel our bariatric psychologist.  Obesity Birtha is currently in the action stage of change. As such, her goal is to continue with weight loss efforts She has agreed to follow the Category 4 plan. Aybree has not been prescribed exercise at this time. We discussed the following Behavioral Modification Strategies today: planning for success, increasing lean protein intake and emotional eating strategies  Kei has agreed to follow up with our clinic in 2 weeks. She was informed of the importance of frequent follow up visits to maximize her success with intensive lifestyle modifications for her multiple health conditions.  ALLERGIES: No Known Allergies  MEDICATIONS: Current Outpatient Medications on File Prior to Visit  Medication Sig Dispense Refill  . albuterol (PROAIR HFA) 108 (90 Base) MCG/ACT inhaler Inhale 2 puffs into the lungs every 6 (six) hours as needed for wheezing or shortness of breath. 8.5 Inhaler 1  . beclomethasone (QVAR) 40 MCG/ACT inhaler Inhale 2 puffs into the lungs 2 (two) times daily. 1 Inhaler 3  . BLISOVI 24 FE 1-20 MG-MCG(24) tablet Take 1 tablet by mouth daily.    . calcium carbonate (TUMS - DOSED IN MG ELEMENTAL CALCIUM) 500 MG chewable tablet Chew 1 tablet by mouth 3 (three) times daily as needed for indigestion or heartburn.     . diphenhydrAMINE HCl, Sleep, (ZZZQUIL) 25 MG CAPS Take 25 mg by mouth at bedtime as needed (for sleep.).    [name] ibuprofen (ADVIL,MOTRIN) 600 MG tablet Take 1 tablet (600 mg total) by mouth every 6 (six) hours as needed (mild pain). 30 tablet 0  . Insulin Pen Needle (BD PEN NEEDLE NANO 2ND GEN) 32G X 4 MM MISC 1 Package by Does not apply route 2 (two) times daily. 100 each 0  .  metoprolol succinate (TOPROL-XL) 50 MG 24 hr tablet Take 1 tablet (50 mg total) by mouth daily. Take with or immediately following a meal. 90 tablet 3  . norethindrone-ethinyl estradiol (BLISOVI FE 1/20) 1-20 MG-MCG tablet Take 1 tablet by mouth daily. 3 Package 1  . Polyethyl Glycol-Propyl Glycol (LUBRICANT EYE DROPS) 0.4-0.3 % SOLN Place 1 drop into both eyes 3 (three) times daily as needed (for dry/allergy eyes.).    [name] sertraline (ZOLOFT) 100 MG tablet Take 100 mg by mouth daily.    [name] triamcinolone cream (KENALOG) 0.1 % Apply 1 application topically 2 (two) times daily as needed (for eczema on hands.). 30 g 3   No current facility-administered medications on file prior to visit.     PAST MEDICAL HISTORY: Past Medical History:  Diagnosis Date  . Anxiety   . Asthma   . Dry eyes   . Dry skin   .  Eczema   . Fatigue   . Fibroids   . H/O echocardiogram    only showed tachycardia  . Heartburn   . HTN (hypertension)   . Hyperlipidemia   . Palpitations   . Pre-diabetes     pre diabetes, Metformin ordered from weight loss clinic  . Rash in adult   . Shortness of breath    due to allergies  . Stress   . Tachycardia   . Vitamin D deficiency   . Wheezing     PAST SURGICAL HISTORY: Past Surgical History:  Procedure Laterality Date  . EYE SURGERY Bilateral 2006   Lasik  . MYOMECTOMY N/A 10/30/2017   Procedure: ABDOMINAL MYOMECTOMY;  Surgeon: Lavonia Drafts, MD;  Location: [facility];  Service: Gynecology;  Laterality: N/A;    SOCIAL HISTORY: Social History   Tobacco Use  . Smoking status: Never Smoker  . Smokeless tobacco: Never Used  Substance Use Topics  . Alcohol use: No    Alcohol/week: 0.0 standard drinks  . Drug use: No    FAMILY HISTORY: Family History  Problem Relation Age of Onset  . Thyroid disease Mother   . Fibroids Mother   . Deep vein thrombosis Mother   . Cancer Maternal Grandmother        liver   . Cancer Maternal Grandfather        pancreatic   . Hyperlipidemia Father   . Hypertension Father   . Sleep apnea Father   . Obesity Father   . Cancer Paternal Grandfather        prostate    ROS: Review of Systems  Constitutional: Positive for weight loss.  Gastrointestinal: Negative for nausea and vomiting.  Musculoskeletal:       Negative for muscle weakness  Endo/Heme/Allergies:       Negative for polyphagia  Psychiatric/Behavioral: Positive for depression. Negative for suicidal ideas.    PHYSICAL EXAM: Blood pressure 118/77, pulse 79, temperature 98.6 F (37 C), temperature source Oral, height 5\' 2"  (1.575 m), weight 292 lb (132.5 kg), last menstrual period 06/01/2018, SpO2 99 %. Body mass index is 53.41 kg/m. Physical Exam Vitals signs reviewed.  Constitutional:      Appearance: Normal appearance. She is well-developed. She is obese.  Cardiovascular:     Rate and Rhythm: Normal rate.  Pulmonary:     Effort: Pulmonary effort is normal.  Musculoskeletal: Normal range of motion.  Skin:    General: Skin is warm and dry.  Neurological:     Mental Status: She is alert and oriented to person, place, and time.  Psychiatric:        Mood and Affect: Mood normal.        Behavior: Behavior normal.        Thought Content: Thought content does not include homicidal or suicidal ideation.     RECENT LABS AND TESTS: BMET    Component Value Date/Time   NA 137 04/25/2018 1318   K 4.3 04/25/2018 1318   CL 101 04/25/2018 1318   CO2 20 04/25/2018 1318   GLUCOSE 89 04/25/2018 1318   GLUCOSE 93 10/17/2017 1548   BUN 10 04/25/2018 1318   CREATININE 0.56 (L) 04/25/2018 1318   CALCIUM 9.3 04/25/2018 1318   GFRNONAA 121 04/25/2018 1318   GFRAA 140 04/25/2018 1318   Lab Results  Component Value Date   HGBA1C 6.0 (H) 04/25/2018   HGBA1C 5.6 01/01/2018   HGBA1C 5.9 (H) 09/10/2017   Lab Results  Component Value Date  INSULIN 19.6 04/25/2018   INSULIN 19.2 01/01/2018   INSULIN 12.3 09/10/2017   CBC    Component Value  Date/Time   WBC 7.8 04/25/2018 1318   WBC 13.3 (H) 10/31/2017 0516   RBC 4.80 04/25/2018 1318   RBC 4.27 10/31/2017 0516   HGB 14.3 04/25/2018 1318   HCT 43.0 04/25/2018 1318   PLT 363 04/25/2018 1318   MCV 90 04/25/2018 1318   MCH 29.8 04/25/2018 1318   MCH 31.9 10/31/2017 0516   MCHC 33.3 04/25/2018 1318   MCHC 33.8 10/31/2017 0516   RDW 12.6 04/25/2018 1318   LYMPHSABS 2.3 04/25/2018 1318   MONOABS 0.5 07/30/2017 1631   EOSABS 0.3 04/25/2018 1318   BASOSABS 0.1 04/25/2018 1318   Iron/TIBC/Ferritin/ %Sat No results found for: IRON, TIBC, FERRITIN, IRONPCTSAT Lipid Panel     Component Value Date/Time   CHOL 204 (H) 04/25/2018 1318   TRIG 118 04/25/2018 1318   HDL 48 04/25/2018 1318   CHOLHDL 4.3 04/25/2018 1318   CHOLHDL 4 07/03/2017 1609   VLDL 19.6 07/03/2017 1609   LDLCALC 132 (H) 04/25/2018 1318   Hepatic Function Panel     Component Value Date/Time   PROT 6.5 04/25/2018 1318   ALBUMIN 3.9 04/25/2018 1318   AST 15 04/25/2018 1318   ALT 16 04/25/2018 1318   ALKPHOS 86 04/25/2018 1318   BILITOT 0.3 04/25/2018 1318      Component Value Date/Time   TSH 3.340 09/10/2017 1058   TSH 3.55 07/30/2017 1631   TSH 2.28 08/17/2016 1707    Ref. Range 04/25/2018 13:18  Vitamin D, 25-Hydroxy Latest Ref Range: 30.0 - 100.0 ng/mL 26.9 (L)      OBESITY BEHAVIORAL INTERVENTION VISIT  Today's visit was # 16   Starting weight: 297 lbs Starting date: 09/10/2017 Today's weight : 292 lbs Today's date: 06/24/2018 Total lbs lost to date: 5   ASK: We discussed the diagnosis of obesity with [name] today and Timothy agreed to give [place] permission to discuss obesity behavioral modification therapy today.  ASSESS: Lashia has the diagnosis of obesity and her BMI today is 53.39 Daniell is in the action stage of change   ADVISE: Flower was educated on the multiple health risks of obesity as well as the benefit of weight loss to improve her health. She was advised of the  need for long term treatment and the importance of lifestyle modifications to improve her current health and to decrease her risk of future health problems.  AGREE: Multiple dietary modification options and treatment options were discussed and  Leilynn agreed to follow the recommendations documented in the above note.  ARRANGE: Lasheena was educated on the importance of frequent visits to treat obesity as outlined per CMS and USPSTF guidelines and agreed to schedule her next follow up appointment today.  Corey Skains, am acting as Location manager for [employer], FNP-C.  I have reviewed the above documentation for accuracy and completeness, and I agree with the above.  - Nabeeha Badertscher, FNP-C.

## 2018-06-26 ENCOUNTER — Encounter (INDEPENDENT_AMBULATORY_CARE_PROVIDER_SITE_OTHER): Payer: Self-pay | Admitting: Family Medicine

## 2018-07-11 ENCOUNTER — Encounter (INDEPENDENT_AMBULATORY_CARE_PROVIDER_SITE_OTHER): Payer: Self-pay

## 2018-07-11 ENCOUNTER — Ambulatory Visit (INDEPENDENT_AMBULATORY_CARE_PROVIDER_SITE_OTHER): Payer: Self-pay | Admitting: Family Medicine

## 2018-07-11 ENCOUNTER — Ambulatory Visit (INDEPENDENT_AMBULATORY_CARE_PROVIDER_SITE_OTHER): Payer: BC Managed Care – PPO | Admitting: Psychology

## 2018-07-11 NOTE — Progress Notes (Unsigned)
Office: [phone]  /  Fax: [phone]    Date: July 11, 2018  Time Seen: *** Duration: *** Provider: Glennie Isle, PsyD Type of Session: Intake for Individual Therapy  Type of Contact: Face-to-face  Informed Consent:The provider's role was explained to Jeanne [name] Age. The provider reviewed and discussed issues of confidentiality, privacy, and limits therein. In addition to verbal informed consent, written informed consent for psychological services was obtained from [name] prior to the initial intake interview. Written consent included information concerning the practice, financial arrangements, and confidentiality and patients' rights. Since the clinic is not a 24/7 crisis center, mental health emergency resources were shared in the form of a handout, and the provider explained MyChart, e-mail, voicemail, and/or other messaging systems should be utilized only for non-emergency reasons. [name] verbally acknowledged understanding of the aforementioned, and agreed to use mental health emergency resources discussed if needed. Moreover, [name] agreed information may be shared with other [facility] providers as needed for coordination of care, and written consent was obtained.   Chief Complaint: Melis was referred by Jake Bathe, FNP-C due to depression with emotional eating behaviors. Per the note for the visit with Jake Bathe, FNP-C on June 24, 2018, "Jeanne [name] tends to get off track and does more emotional eating toward the end of the week. She tends to eat more when she is stressed from work, fatigued or feeling overwhelmed. 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  She shows no sign of suicidal or homicidal ideations."  [name] was asked to complete a questionnaire assessing various behaviors related to emotional eating. [name] endorsed the following: {gbmoodandfood:21755}.  HPI: Per the note for the initial visit with Dr. Ilene Qua on September 10, 2017, Jeanne [name] has been heavy most of her life, and she started gaining weight around 3rd and 4th grade. During the initial appointment with Dr. Ilene Qua, Jeanne [name] reported experiencing the following: significant food cravings issues , frequently drinking liquids with calories, frequently making poor food choices, frequently eating larger portions than normal , binge eating behaviors, struggling with emotional eating and having problems with excessive hunger.               Mental Status Examination: Torunn arrived on time for the appointment. She presented as appropriately dressed and groomed. Barbarann appeared her stated age and demonstrated adequate orientation to time, place, person, and purpose of the appointment. She also demonstrated appropriate eye contact. No psychomotor abnormalities or behavioral peculiarities noted. Her mood was {gbmood:21757} with congruent affect. Her thought processes were logical, linear, and goal-directed. No hallucinations, delusions, bizarre thinking or behavior reported or observed. Judgment, insight, and impulse control appeared to be grossly intact. There was no evidence of paraphasias (i.e., errors in speech, gross mispronunciations, and word substitutions), repetition deficits, or disturbances in volume or prosody (i.e., rhythm and intonation). There was no evidence of attention or memory impairments. [name] denied current suicidal and homicidal ideation, plan, and intent.   The Mini-Mental State Examination, Second Edition (MMSE-2) was administered. The MMSE-2 briefly screens for cognitive dysfunction and overall mental status and assesses different cognitive domains: orientation, registration, attention and calculation, recall, and language and praxis. Jeanne [name] received *** out of 30 points possible on the MMSE-2, which is noted in the *** range.   Family & Psychosocial History: ***  Medical History:  Past Medical History:  Diagnosis Date  . Anxiety   . Asthma    . Dry eyes   . Dry skin   .  Eczema   . Fatigue   . Fibroids   . H/O echocardiogram    only showed tachycardia  . Heartburn   . HTN (hypertension)   . Hyperlipidemia   . Palpitations   . Pre-diabetes     pre diabetes, Metformin ordered from weight loss clinic  . Rash in adult   . Shortness of breath    due to allergies  . Stress   . Tachycardia   . Vitamin D deficiency   . Wheezing    Past Surgical History:  Procedure Laterality Date  . EYE SURGERY Bilateral 2006   Lasik  . MYOMECTOMY N/A 10/30/2017   Procedure: ABDOMINAL MYOMECTOMY;  Surgeon: Lavonia Drafts, MD;  Location: [facility];  Service: Gynecology;  Laterality: N/A;   Current Outpatient Medications on File Prior to Visit  Medication Sig Dispense Refill  . albuterol (PROAIR HFA) 108 (90 Base) MCG/ACT inhaler Inhale 2 puffs into the lungs every 6 (six) hours as needed for wheezing or shortness of breath. 8.5 Inhaler 1  . beclomethasone (QVAR) 40 MCG/ACT inhaler Inhale 2 puffs into the lungs 2 (two) times daily. 1 Inhaler 3  . BLISOVI 24 FE 1-20 MG-MCG(24) tablet Take 1 tablet by mouth daily.    [name] buPROPion (WELLBUTRIN SR) 150 MG 12 hr tablet Take 1 tablet (150 mg total) by mouth daily with breakfast. 30 tablet 0  . calcium carbonate (TUMS - DOSED IN MG ELEMENTAL CALCIUM) 500 MG chewable tablet Chew 1 tablet by mouth 3 (three) times daily as needed for indigestion or heartburn.     . diphenhydrAMINE HCl, Sleep, (ZZZQUIL) 25 MG CAPS Take 25 mg by mouth at bedtime as needed (for sleep.).    [name] ibuprofen (ADVIL,MOTRIN) 600 MG tablet Take 1 tablet (600 mg total) by mouth every 6 (six) hours as needed (mild pain). 30 tablet 0  . Insulin Pen Needle (BD PEN NEEDLE NANO 2ND GEN) 32G X 4 MM MISC 1 Package by Does not apply route 2 (two) times daily. 100 each 0  . liraglutide (VICTOZA) 18 MG/3ML SOPN Inject 0.2 mLs (1.2 mg total) into the skin every morning. 2 pen 0  . metoprolol succinate (TOPROL-XL) 50 MG 24 hr tablet Take 1  tablet (50 mg total) by mouth daily. Take with or immediately following a meal. 90 tablet 3  . norethindrone-ethinyl estradiol (BLISOVI FE 1/20) 1-20 MG-MCG tablet Take 1 tablet by mouth daily. 3 Package 1  . Polyethyl Glycol-Propyl Glycol (LUBRICANT EYE DROPS) 0.4-0.3 % SOLN Place 1 drop into both eyes 3 (three) times daily as needed (for dry/allergy eyes.).    [name] sertraline (ZOLOFT) 100 MG tablet Take 100 mg by mouth daily.    [name] triamcinolone cream (KENALOG) 0.1 % Apply 1 application topically 2 (two) times daily as needed (for eczema on hands.). 30 g 3  . Vitamin D, Ergocalciferol, (DRISDOL) 1.25 MG (50000 UT) CAPS capsule Take 1 capsule (50,000 Units total) by mouth every 3 (three) days. 10 capsule 0   No current facility-administered medications on file prior to visit.    Mental Health History: ***  Kemaria denied a trauma history, including {gbtrauma:22071} abuse, as well as neglect. ***  Cookie reported experiencing the following: {gbintakesxs:21966}  Jnyah reported experiencing worry thoughts regarding the following:   Dalya denied experiencing the following: {gbsxs:21965}  She also denied history of and current suicidal ideation, plan, and intent; history of and current homicidal ideation, plan, and intent; and history of and current engagement in self-harm.  The following  strengths were reported by Jeanne [name]:  The following strengths were observed by this provider: {gbstrengths:22223}.  Structured Assessment Results: The Patient Health Questionnaire-9 (PHQ-9) is a self-report measure that assesses symptoms and severity of depression over the course of the last two weeks. Errin obtained a score of *** suggesting {GBPHQ9SEVERITY:21752}. Daleen finds the endorsed symptoms to be {gbphq9difficulty:21754}.    The Generalized Anxiety Disorder-7 (GAD-7) is a brief self-report measure that assesses symptoms of anxiety over the course of the last two weeks. Delano obtained a score of ***  suggesting {gbgad7severity:21753}.  Interventions: A chart review was conducted prior to the clinical intake interview. The MMSE-2, PHQ-9, and GAD-7 were administered and a clinical intake interview was completed. In addition, Josie was asked to complete a Mood and Food questionnaire to assess various behaviors related to emotional eating. Throughout session, empathic reflections and validation was provided. Continuing treatment with this provider was discussed and a treatment goal was established. Psychoeducation regarding emotional versus physical hunger was provided. Bernardette was given a handout to utilize between now and the next appointment to increase awareness of hunger patterns and subsequent eating. ***  Provisional DSM-5 Diagnosis: ***  Plan: Santiago appears able and willing to participate as evidenced by collaboration on a treatment goal, engagement in reciprocal conversation, and asking questions as needed for clarification. The next appointment will be scheduled in {gbweeks:21758}. The following treatment goal was established: {gbtxgoals:21759}. For the aforementioned goal, Nykira can benefit from biweekly individual therapy sessions that are brief in duration for approximately four to six sessions. The treatment modality will be individual therapeutic services, including an eclectic therapeutic approach utilizing techniques from Cognitive Behavioral Therapy, Patient Centered Therapy, Dialectical Behavior Therapy, Acceptance and Commitment Therapy, Interpersonal Therapy, and Cognitive Restructuring. Therapeutic approach will include various interventions as appropriate, such as validation, support, mindfulness, thought defusion, reframing, psychoeducation, values assessment, and role playing. This provider will regularly review the treatment plan and medical chart to keep informed of status changes. Chimere expressed understanding and agreement with the initial treatment plan of care.

## 2018-07-18 ENCOUNTER — Other Ambulatory Visit (INDEPENDENT_AMBULATORY_CARE_PROVIDER_SITE_OTHER): Payer: Self-pay | Admitting: Family Medicine

## 2018-07-22 ENCOUNTER — Encounter (INDEPENDENT_AMBULATORY_CARE_PROVIDER_SITE_OTHER): Payer: Self-pay | Admitting: Physician Assistant

## 2018-07-22 ENCOUNTER — Ambulatory Visit (INDEPENDENT_AMBULATORY_CARE_PROVIDER_SITE_OTHER): Payer: BC Managed Care – PPO | Admitting: Physician Assistant

## 2018-07-22 VITALS — BP 125/84 | HR 106 | Temp 97.7°F | Ht 62.0 in | Wt 290.0 lb

## 2018-07-22 DIAGNOSIS — Z9189 Other specified personal risk factors, not elsewhere classified: Secondary | ICD-10-CM | POA: Diagnosis not present

## 2018-07-22 DIAGNOSIS — F3289 Other specified depressive episodes: Secondary | ICD-10-CM

## 2018-07-22 DIAGNOSIS — Z6841 Body Mass Index (BMI) 40.0 and over, adult: Secondary | ICD-10-CM

## 2018-07-22 DIAGNOSIS — R7303 Prediabetes: Secondary | ICD-10-CM | POA: Diagnosis not present

## 2018-07-22 MED ORDER — BUPROPION HCL ER (SR) 150 MG PO TB12: 150.0000 mg | ORAL_TABLET | Freq: Every day | ORAL | 0 refills | Status: DC

## 2018-07-22 MED ORDER — LIRAGLUTIDE 18 MG/3ML ~~LOC~~ SOPN: 1.8000 mg | PEN_INJECTOR | Freq: Every morning | SUBCUTANEOUS | 0 refills | Status: DC

## 2018-07-23 ENCOUNTER — Encounter: Payer: Self-pay | Admitting: Emergency Medicine

## 2018-07-23 DIAGNOSIS — F329 Major depressive disorder, single episode, unspecified: Secondary | ICD-10-CM | POA: Insufficient documentation

## 2018-07-23 DIAGNOSIS — F419 Anxiety disorder, unspecified: Secondary | ICD-10-CM | POA: Insufficient documentation

## 2018-07-23 NOTE — Progress Notes (Signed)
Office: [phone]  /  Fax: [phone]   HPI:   Chief Complaint: OBESITY Jeanne [name] is here to discuss her progress with her obesity treatment plan. She is on the Category 4 plan and is following her eating plan approximately 60% of the time. She states she is exercising 0 minutes 0 times per week. Jeanne [name] did well with weight loss. She reports that she continues to have some cravings mixed with actual hunger intermittently. Her weight is 290 lb (131.5 kg) today and has had a weight loss of 2 pounds over a period of 4 weeks since her last visit. She has lost 7 lbs since starting treatment with [place].  Pre-Diabetes Jeanne [name] has a diagnosis of prediabetes based on her elevated Hgb A1c and was informed this puts her at greater risk of developing diabetes. Jeanne [name] is taking Victoza currently and continues to work on diet and exercise to decrease risk of diabetes. She denies nausea, vomiting, or diarrhea on Victoza. She does report some polyphagia.  At risk for diabetes Jeanne [name] is at higher than averagerisk for developing diabetes due to her obesity. She currently denies polyuria or polydipsia.  Depression with emotional eating behaviors Jeanne [name] reports that Bupropion has helped with energy and to decrease her cravings. She shows no sign of suicidal or homicidal ideations.  Depression screen [facility] 2/9 09/10/2017 03/31/2016  Decreased Interest 3 0  Down, Depressed, Hopeless 2 0  PHQ - 2 Score 5 0  Altered sleeping 2 -  Tired, decreased energy 3 -  Change in appetite 3 -  Feeling bad or failure about yourself  3 -  Trouble concentrating 1 -  Moving slowly or fidgety/restless 1 -  Suicidal thoughts 0 -  PHQ-9 Score 18 -  Difficult doing work/chores Very difficult -   ASSESSMENT AND PLAN:  Prediabetes - Plan: liraglutide (VICTOZA) 18 MG/3ML SOPN  Other depression - with emotional eating - Plan: buPROPion (WELLBUTRIN SR) 150 MG 12 hr tablet  At risk for diabetes mellitus  Class 3 severe obesity  with serious comorbidity and body mass index (BMI) of 50.0 to 59.9 in adult, unspecified obesity type (Jeanne [name])  PLAN:  Pre-Diabetes Jeanne [name] will continue to work on weight loss, exercise, and decreasing simple carbohydrates in her diet to help decrease the risk of diabetes. She was informed that eating too many simple carbohydrates or too many calories at one sitting increases the likelihood of GI side effects. Jeanne [name] is on Victoza and she was given a refill prescription to increase the dose to 1.8 mg a day #3. Jeanne [name] agreed to follow-up with [place] as directed to monitor her progress.  Diabetes risk counseling Jeanne [name] was given extended (15 minutes) diabetes prevention counseling today. She is 38 y.o. female and has risk factors for diabetes including obesity. We discussed intensive lifestyle modifications today with an emphasis on weight loss as well as increasing exercise and decreasing simple carbohydrates in her diet.  Depression with Emotional Eating Behaviors We discussed behavior modification techniques today to help Jeanne [name] deal with her emotional eating and depression. She was given a refill on her bupropion #30 with no refills. Jeanne [name] has agreed to follow-up as directed.  Obesity Jeanne [name] is currently in the action stage of change. As such, her goal is to continue with weight loss efforts. She has agreed to follow the Category 4 plan. Jeanne [name] has been instructed to work up to a goal of 150 minutes of combined cardio and strengthening exercise per week for weight loss and overall health benefits. We  discussed the following Behavioral Modification Strategies today: increasing lean protein intake and work on meal planning and easy cooking plans.  Jeanne [name] has agreed to follow-up with our clinic in 2 weeks. She was informed of the importance of frequent follow up visits to maximize her success with intensive lifestyle modifications for her multiple health conditions.  ALLERGIES: No Known  Allergies  MEDICATIONS: Current Outpatient Medications on File Prior to Visit  Medication Sig Dispense Refill  . albuterol (PROAIR HFA) 108 (90 Base) MCG/ACT inhaler Inhale 2 puffs into the lungs every 6 (six) hours as needed for wheezing or shortness of breath. 8.5 Inhaler 1  . beclomethasone (QVAR) 40 MCG/ACT inhaler Inhale 2 puffs into the lungs 2 (two) times daily. 1 Inhaler 3  . BLISOVI 24 FE 1-20 MG-MCG(24) tablet Take 1 tablet by mouth daily.    . calcium carbonate (TUMS - DOSED IN MG ELEMENTAL CALCIUM) 500 MG chewable tablet Chew 1 tablet by mouth 3 (three) times daily as needed for indigestion or heartburn.     . diphenhydrAMINE HCl, Sleep, (ZZZQUIL) 25 MG CAPS Take 25 mg by mouth at bedtime as needed (for sleep.).    [name] ibuprofen (ADVIL,MOTRIN) 600 MG tablet Take 1 tablet (600 mg total) by mouth every 6 (six) hours as needed (mild pain). 30 tablet 0  . Insulin Pen Needle (BD PEN NEEDLE NANO 2ND GEN) 32G X 4 MM MISC 1 Package by Does not apply route 2 (two) times daily. 100 each 0  . metoprolol succinate (TOPROL-XL) 50 MG 24 hr tablet Take 1 tablet (50 mg total) by mouth daily. Take with or immediately following a meal. 90 tablet 3  . norethindrone-ethinyl estradiol (BLISOVI FE 1/20) 1-20 MG-MCG tablet Take 1 tablet by mouth daily. 3 Package 1  . Polyethyl Glycol-Propyl Glycol (LUBRICANT EYE DROPS) 0.4-0.3 % SOLN Place 1 drop into both eyes 3 (three) times daily as needed (for dry/allergy eyes.).    [name] sertraline (ZOLOFT) 100 MG tablet Take 100 mg by mouth daily.    [name] triamcinolone cream (KENALOG) 0.1 % Apply 1 application topically 2 (two) times daily as needed (for eczema on hands.). 30 g 3  . Vitamin D, Ergocalciferol, (DRISDOL) 1.25 MG (50000 UT) CAPS capsule Take 1 capsule (50,000 Units total) by mouth every 3 (three) days. 10 capsule 0   No current facility-administered medications on file prior to visit.     PAST MEDICAL HISTORY: Past Medical History:  Diagnosis Date  . Anxiety    . Asthma   . Dry eyes   . Dry skin   . Eczema   . Fatigue   . Fibroids   . H/O echocardiogram    only showed tachycardia  . Heartburn   . HTN (hypertension)   . Hyperlipidemia   . Palpitations   . Pre-diabetes     pre diabetes, Metformin ordered from weight loss clinic  . Rash in adult   . Shortness of breath    due to allergies  . Stress   . Tachycardia   . Vitamin D deficiency   . Wheezing     PAST SURGICAL HISTORY: Past Surgical History:  Procedure Laterality Date  . EYE SURGERY Bilateral 2006   Lasik  . MYOMECTOMY N/A 10/30/2017   Procedure: ABDOMINAL MYOMECTOMY;  Surgeon: Lavonia Drafts, MD;  Location: [facility];  Service: Gynecology;  Laterality: N/A;    SOCIAL HISTORY: Social History   Tobacco Use  . Smoking status: Never Smoker  . Smokeless tobacco: Never Used  Substance Use Topics  . Alcohol use: No    Alcohol/week: 0.0 standard drinks  . Drug use: No    FAMILY HISTORY: Family History  Problem Relation Age of Onset  . Thyroid disease Mother   . Fibroids Mother   . Deep vein thrombosis Mother   . Cancer Maternal Grandmother        liver   . Cancer Maternal Grandfather        pancreatic  . Hyperlipidemia Father   . Hypertension Father   . Sleep apnea Father   . Obesity Father   . Cancer Paternal Grandfather        prostate   ROS: Review of Systems  Constitutional: Positive for weight loss.  Gastrointestinal: Negative for diarrhea, nausea and vomiting.  Genitourinary:       Negative for polyuria.  Endo/Heme/Allergies: Negative for polydipsia.       Positive for polyphagia. Negative for hypoglycemia.  Psychiatric/Behavioral: Negative for suicidal ideas.       Negative for homicidal ideas.   PHYSICAL EXAM: Blood pressure 125/84, pulse (!) 106, temperature 97.7 F (36.5 C), height 5\' 2"  (1.575 m), weight 290 lb (131.5 kg), last menstrual period 07/09/2018, SpO2 97 %. Body mass index is 53.04 kg/m. Physical Exam Vitals signs  reviewed.  Constitutional:      Appearance: Normal appearance. She is obese.  Cardiovascular:     Rate and Rhythm: Normal rate.     Pulses: Normal pulses.  Pulmonary:     Effort: Pulmonary effort is normal.     Breath sounds: Normal breath sounds.  Musculoskeletal: Normal range of motion.  Skin:    General: Skin is warm and dry.  Neurological:     Mental Status: She is alert and oriented to person, place, and time.  Psychiatric:        Behavior: Behavior normal.        Thought Content: Thought content does not include homicidal or suicidal ideation.   RECENT LABS AND TESTS: BMET    Component Value Date/Time   NA 137 04/25/2018 1318   K 4.3 04/25/2018 1318   CL 101 04/25/2018 1318   CO2 20 04/25/2018 1318   GLUCOSE 89 04/25/2018 1318   GLUCOSE 93 10/17/2017 1548   BUN 10 04/25/2018 1318   CREATININE 0.56 (L) 04/25/2018 1318   CALCIUM 9.3 04/25/2018 1318   GFRNONAA 121 04/25/2018 1318   GFRAA 140 04/25/2018 1318   Lab Results  Component Value Date   HGBA1C 6.0 (H) 04/25/2018   HGBA1C 5.6 01/01/2018   HGBA1C 5.9 (H) 09/10/2017   Lab Results  Component Value Date   INSULIN 19.6 04/25/2018   INSULIN 19.2 01/01/2018   INSULIN 12.3 09/10/2017   CBC    Component Value Date/Time   WBC 7.8 04/25/2018 1318   WBC 13.3 (H) 10/31/2017 0516   RBC 4.80 04/25/2018 1318   RBC 4.27 10/31/2017 0516   HGB 14.3 04/25/2018 1318   HCT 43.0 04/25/2018 1318   PLT 363 04/25/2018 1318   MCV 90 04/25/2018 1318   MCH 29.8 04/25/2018 1318   MCH 31.9 10/31/2017 0516   MCHC 33.3 04/25/2018 1318   MCHC 33.8 10/31/2017 0516   RDW 12.6 04/25/2018 1318   LYMPHSABS 2.3 04/25/2018 1318   MONOABS 0.5 07/30/2017 1631   EOSABS 0.3 04/25/2018 1318   BASOSABS 0.1 04/25/2018 1318   Iron/TIBC/Ferritin/ %Sat No results found for: IRON, TIBC, FERRITIN, IRONPCTSAT Lipid Panel     Component Value Date/Time   CHOL  204 (H) 04/25/2018 1318   TRIG 118 04/25/2018 1318   HDL 48 04/25/2018 1318    CHOLHDL 4.3 04/25/2018 1318   CHOLHDL 4 07/03/2017 1609   VLDL 19.6 07/03/2017 1609   LDLCALC 132 (H) 04/25/2018 1318   Hepatic Function Panel     Component Value Date/Time   PROT 6.5 04/25/2018 1318   ALBUMIN 3.9 04/25/2018 1318   AST 15 04/25/2018 1318   ALT 16 04/25/2018 1318   ALKPHOS 86 04/25/2018 1318   BILITOT 0.3 04/25/2018 1318      Component Value Date/Time   TSH 3.340 09/10/2017 1058   TSH 3.55 07/30/2017 1631   TSH 2.28 08/17/2016 1707    Ref. Range 04/25/2018 13:18  Vitamin D, 25-Hydroxy Latest Ref Range: 30.0 - 100.0 ng/mL 26.9 (L)   OBESITY BEHAVIORAL INTERVENTION VISIT  Today's visit was #17  Starting weight: 297 lbs Starting date: 09/10/2017 Today's weight: 290 lbs Today's date: 07/22/2018 Total lbs lost to date: 7  ASK: We discussed the diagnosis of obesity with [name] today and Destaney agreed to give [place] permission to discuss obesity behavioral modification therapy today.  ASSESS: Sherrise has the diagnosis of obesity and her BMI today is 53.04. Saranya is in the action stage of change.   ADVISE: Glee was educated on the multiple health risks of obesity as well as the benefit of weight loss to improve her health. She was advised of the need for long term treatment and the importance of lifestyle modifications to improve her current health and to decrease her risk of future health problems.  AGREE: Multiple dietary modification options and treatment options were discussed and  Jeanne [name] agreed to follow the recommendations documented in the above note.  ARRANGE: Jeanne [name] was educated on the importance of frequent visits to treat obesity as outlined per CMS and USPSTF guidelines and agreed to schedule her next follow up appointment today.  Migdalia Dk, am acting as transcriptionist for Abby Potash, PA-C I, Abby Potash, PA-C have reviewed above note and agree with its content

## 2018-07-29 ENCOUNTER — Ambulatory Visit (INDEPENDENT_AMBULATORY_CARE_PROVIDER_SITE_OTHER): Payer: BC Managed Care – PPO | Admitting: Psychology

## 2018-07-29 DIAGNOSIS — F3289 Other specified depressive episodes: Secondary | ICD-10-CM | POA: Diagnosis not present

## 2018-07-29 NOTE — Progress Notes (Signed)
Office: [phone]  /  Fax: [phone]    Date: July 29, 2018  Time Seen: 3:12pm Duration: 46 minutes Provider: Glennie Isle, PsyD Type of Session: Intake for Individual Therapy  Type of Contact: Face-to-face  Informed Consent:The provider's role was explained to Jeanne [name]. The provider reviewed and discussed issues of confidentiality, privacy, and limits therein. In addition to verbal informed consent, written informed consent for psychological services was obtained from Jeanne [name] prior to the initial intake interview. Written consent included information concerning the practice, financial arrangements, and confidentiality and patients' rights. Since the clinic is not a 24/7 crisis center, mental health emergency resources were shared in the form of a handout, and the provider explained Jeanne [name], e-mail, voicemail, and/or other messaging systems should be utilized only for non-emergency reasons. Jeanne [name] verbally acknowledged understanding of the aforementioned, and agreed to use mental health emergency resources discussed if needed. Moreover, Jeanne [name] agreed information may be shared with other Jeanne [name] providers as needed for coordination of care, and written consent was obtained.   Chief Complaint: Jeanne [name] was referred by Jeanne Bathe, Jeanne [name] due to depression with emotional eating behaviors. Per the note for the visit with Jeanne Bathe, Jeanne [name] on June 24, 2018, "Jeanne [name] tends to get off track and does more emotional eating toward the end of the week. She tends to eat more when she is stressed from work, fatigued or feeling overwhelmed. She is a Jeanne [name] and her job is stressful.  She shows no sign of suicidal or homicidal ideations."  During today's appointment, Jeanne [name] reported, "I've been trying to lose weight my whole life. This is just another thing I wanted to try to be healthy." She noted her last episode of emotional eating was yesterday. Jeanne [name] explained  she was home alone and bored resulting in her eating half of a small pizza. She shared she tends to crave chocolate and salty foods, such as pizza and Jeanne [name]. Currently, she indicated she tries to "stick to" her prescribed structured meal plan.   Jeanne [name] was asked to complete a questionnaire assessing various behaviors related to emotional eating. Jeanne [name] endorsed the following: overeat when you are celebrating, experience food cravings on a regular basis, eat certain foods when you are anxious, stressed, depressed, or your feelings are hurt, use food to help you cope with emotional situations, find food is comforting to you, overeat when you are angry or upset, overeat when you are worried about something, overeat frequently when you are bored or lonely, overeat when you are alone, but eat much less when you are with other people and eat as a reward.  HPI: Per the note for the initial visit with Jeanne [name] on September 10, 2017, Jeanne [name] reported experiencing the following: significant food cravings issues , frequently drinking liquids with calories, frequently making poor food choices, frequently eating larger portions than normal , binge eating behaviors, struggling with emotional eating and having problems with excessive hunger. During today's appointment, Jeanne [name] reported the onset of emotional eating was in middle school. She could not recall any significant events aside from puberty. She denied recently binge eating, but noted she continues to overeat. She denied a history of purging and engagement in other compensatory strategies, and has never been diagnosed with an eating disorder.  Mental Status Examination: Jeanne [name] arrived on time for the appointment; however, the appointment was initiated late due to this provider. She presented as appropriately dressed and groomed. Jeanne [name] appeared her stated age and demonstrated adequate orientation to time,  place, person, and purpose of the appointment.  She also demonstrated appropriate eye contact. No psychomotor abnormalities or behavioral peculiarities noted. Her mood was euthymic with congruent affect. Her thought processes were logical, linear, and goal-directed. No hallucinations, delusions, bizarre thinking or behavior reported or observed. Judgment, insight, and impulse control appeared to be grossly intact. There was no evidence of paraphasias (i.e., errors in speech, gross mispronunciations, and word substitutions), repetition deficits, or disturbances in volume or prosody (i.e., rhythm and intonation). There was no evidence of attention or memory impairments. Jeanne [name] denied current suicidal and homicidal ideation, plan, and intent.   Family & Psychosocial History: Jeanne [name] reported she has been married for four years, and does not have any children. 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 She noted her social support system consists of her husband, parents, two sisters, brother, couple of friends, and couple of co-workers. Jeanne [name] noted she identifies with Christianity, and she attends church "occasionally."   Medical History:  Past Medical History:  Diagnosis Date  . Anxiety   . Asthma   . Dry eyes   . Dry skin   . Eczema   . Fatigue   . Fibroids   . H/O echocardiogram    only showed tachycardia  . Heartburn   . HTN (hypertension)   . Hyperlipidemia   . Palpitations   . Pre-diabetes     pre diabetes, Metformin ordered from weight loss clinic  . Rash in adult   . Shortness of breath    due to allergies  . Stress   . Tachycardia   . Vitamin Jeanne [name] deficiency   . Wheezing    Past Surgical History:  Procedure Laterality Date  . EYE SURGERY Bilateral 2006   Lasik  . MYOMECTOMY N/A 10/30/2017   Procedure: ABDOMINAL MYOMECTOMY;  Surgeon: Lavonia Drafts, MD;  Location: [facility];  Service: Gynecology;  Laterality: N/A;   Current  Outpatient Medications on File Prior to Visit  Medication Sig Dispense Refill  . albuterol (PROAIR HFA) 108 (90 Base) MCG/ACT inhaler Inhale 2 puffs into the lungs every 6 (six) hours as needed for wheezing or shortness of breath. 8.5 Inhaler 1  . beclomethasone (QVAR) 40 MCG/ACT inhaler Inhale 2 puffs into the lungs 2 (two) times daily. 1 Inhaler 3  . BLISOVI 24 FE 1-20 MG-MCG(24) tablet Take 1 tablet by mouth daily.    [name] buPROPion (WELLBUTRIN SR) 150 MG 12 hr tablet Take 1 tablet (150 mg total) by mouth daily with breakfast. 30 tablet 0  . calcium carbonate (TUMS - DOSED IN MG ELEMENTAL CALCIUM) 500 MG chewable tablet Chew 1 tablet by mouth 3 (three) times daily as needed for indigestion or heartburn.     . diphenhydrAMINE HCl, Sleep, (ZZZQUIL) 25 MG CAPS Take 25 mg by mouth at bedtime as needed (for sleep.).    [name] ibuprofen (ADVIL,MOTRIN) 600 MG tablet Take 1 tablet (600 mg total) by mouth every 6 (six) hours as needed (mild pain). 30 tablet 0  . Insulin Pen Needle (BD PEN NEEDLE NANO 2ND GEN) 32G X 4 MM MISC 1 Package by Does not apply route 2 (two) times daily. 100 each 0  . liraglutide (VICTOZA) 18 MG/3ML SOPN Inject 0.3 mLs (1.8 mg total) into the skin every morning. 3 pen 0  . metoprolol succinate (TOPROL-XL) 50 MG 24 hr tablet Take 1 tablet (50 mg total) by mouth daily. Take with  or immediately following a meal. 90 tablet 3  . norethindrone-ethinyl estradiol (BLISOVI FE 1/20) 1-20 MG-MCG tablet Take 1 tablet by mouth daily. 3 Package 1  . Polyethyl Glycol-Propyl Glycol (LUBRICANT EYE DROPS) 0.4-0.3 % SOLN Place 1 drop into both eyes 3 (three) times daily as needed (for dry/allergy eyes.).    [name] sertraline (ZOLOFT) 100 MG tablet Take 100 mg by mouth daily.    . traZODone (DESYREL) 100 MG tablet Take 100 mg by mouth at bedtime.    . triamcinolone cream (KENALOG) 0.1 % Apply 1 application topically 2 (two) times daily as needed (for eczema on hands.). 30 g 3  . Vitamin Jeanne [name], Ergocalciferol,  (DRISDOL) 1.25 MG (50000 UT) CAPS capsule Take 1 capsule (50,000 Units total) by mouth every 3 (three) days. 10 capsule 0   No current facility-administered medications on file prior to visit.   Jeanne [name] denied a history of head injuries and loss of consciousness.   Mental Health History: Jeanne [name] shared she first received therapeutic services around 2014 due to issues related to eating. She noted it was in the form of individual and group therapy. Additionally, Jeanne [name] reported receiving services at [facility], and she noted, "I didn't fit with the therapist." She noted it was for overeating, and in the form of individual therapy. Currently, Jeanne [name] meets with Jeanne Headings, NP for medication management at [facility]. They typically meet once a year and their next appointment is in either June or July of this year. Jeanne [name] explained Ms. Carter prescribed Zoloft, and she noted a belief that she is diagnosed with generalized anxiety disorder. Currently, Jeanne Schwab, Jeanne [name] prescribes Wellbutrin. Jeanne [name] denied history of hospitalizations for psychiatric concerns and she also denied a family history of mental with related concerns. Jeanne [name] further denied a trauma history, including psychological, physical  and sexual abuse, as well as neglect.    Jeanne [name] shared her mood is typically "pretty positive." Since starting Wellbutrin, she noted an increase in energy. She stated she experiences difficulty staying asleep resulting in her averaging approximately 6 hours of sleep at night. She also endorsed overeating, fatigue, and  decreased self-esteem due to weight. Dakiya denied a history of illicit and recreational substance use and noted she consumes alcohol "once in a while."  Kirstin denied experiencing the following: anhedonia, hopelessness, attention and concentration issues, memory concerns, feeling fidgety/restless, irritability, obsessions and compulsions, hallucinations and delusions, mania,  angry outbursts, social withdrawal and worry thoughts. She also denied history of and current suicidal ideation, plan, and intent; history of and current homicidal ideation, plan, and intent; and history of and current engagement in self-harm.  The following strengths were reported by Jeanne [name]: good teacher, good relationships, peacemaker, and family oriented. The following strengths were observed by this provider: ability to express thoughts and feelings during the therapeutic session, ability to establish and benefit from a therapeutic relationship, ability to learn and practice coping skills, willingness to work toward established goal(s) with the clinic and ability to engage in reciprocal conversation.  Legal History: Janalyn denied a history of legal involvement.   Structured Assessment Results: The Patient Health Questionnaire-9 (PHQ-9) is a self-report measure that assesses symptoms and severity of depression over the course of the last two weeks. Reneshia obtained a score of 5 suggesting mild depression. Cecilee finds the endorsed symptoms to be not difficult at all. Depression screen [employer] [name] 2/9 07/29/2018  Decreased Interest 0  Down, Depressed, Hopeless 0  PHQ - 2 Score 0  Altered sleeping 2  Tired,  decreased energy 1  Change in appetite 2  Feeling bad or failure about yourself  0  Trouble concentrating 0  Moving slowly or fidgety/restless 0  Suicidal thoughts 0  PHQ-9 Score 5  Difficult doing work/chores -   The Generalized Anxiety Disorder-7 (GAD-7) is a brief self-report measure that assesses symptoms of anxiety over the course of the last two weeks. Alice obtained a score of zero. GAD 7 : Generalized Anxiety Score 07/29/2018  Nervous, Anxious, on Edge 0  Control/stop worrying 0  Worry too much - different things 0  Trouble relaxing 0  Restless 0  Easily annoyed or irritable 0  Afraid - awful might happen 0  Total GAD 7 Score 0  Anxiety Difficulty Not difficult at all    Interventions: A chart review was conducted prior to the clinical intake interview. The PHQ-9, and GAD-7 were administered and a clinical intake interview was completed. In addition, Ricquel was asked to complete a Mood and Food questionnaire to assess various behaviors related to emotional eating. Throughout session, empathic reflections and validation was provided. Continuing treatment with this provider was discussed and a treatment goal was established. Psychoeducation regarding emotional versus physical hunger was provided. Mikell was given a handout to utilize between now and the next appointment to increase awareness of hunger patterns and subsequent eating.   Provisional DSM-5 Diagnosis: 311 (F32.8) Other Specified Depressive Disorder, Emotional Eating Behaviors  Plan: Kadesia appears able and willing to participate as evidenced by collaboration on a treatment goal, engagement in reciprocal conversation, and asking questions as needed for clarification. The next appointment will be scheduled in two weeks. The following treatment goal was established: decrease emotional eating. For the aforementioned goal, Nelia can benefit from biweekly individual therapy sessions that are brief in duration for approximately four to six sessions. The treatment modality will be individual therapeutic services, including an eclectic therapeutic approach utilizing techniques from Cognitive Behavioral Therapy, Patient Centered Therapy, Dialectical Behavior Therapy, Acceptance and Commitment Therapy, Interpersonal Therapy, and Cognitive Restructuring. Therapeutic approach will include various interventions as appropriate, such as validation, support, mindfulness, thought defusion, reframing, psychoeducation, values assessment, and role playing. This provider will regularly review the treatment plan and medical chart to keep informed of status changes. [facility] expressed understanding and agreement with the initial treatment  plan of care.

## 2018-08-07 ENCOUNTER — Encounter (INDEPENDENT_AMBULATORY_CARE_PROVIDER_SITE_OTHER): Payer: Self-pay | Admitting: Physician Assistant

## 2018-08-07 ENCOUNTER — Ambulatory Visit (INDEPENDENT_AMBULATORY_CARE_PROVIDER_SITE_OTHER): Payer: BC Managed Care – PPO | Admitting: Physician Assistant

## 2018-08-07 VITALS — BP 119/83 | HR 102 | Temp 97.5°F | Ht 62.0 in | Wt 292.0 lb

## 2018-08-07 DIAGNOSIS — Z9189 Other specified personal risk factors, not elsewhere classified: Secondary | ICD-10-CM | POA: Diagnosis not present

## 2018-08-07 DIAGNOSIS — E7849 Other hyperlipidemia: Secondary | ICD-10-CM | POA: Diagnosis not present

## 2018-08-07 DIAGNOSIS — Z6841 Body Mass Index (BMI) 40.0 and over, adult: Secondary | ICD-10-CM

## 2018-08-07 DIAGNOSIS — F3289 Other specified depressive episodes: Secondary | ICD-10-CM | POA: Diagnosis not present

## 2018-08-07 MED ORDER — BUPROPION HCL ER (SR) 150 MG PO TB12: 150.0000 mg | ORAL_TABLET | Freq: Every day | ORAL | 0 refills | Status: DC

## 2018-08-08 NOTE — Progress Notes (Signed)
Office: [phone]  /  Fax: [phone]   HPI:   Chief Complaint: OBESITY Jeanne [name] is here to discuss her progress with her obesity treatment plan. She is on the Category 4 plan and is following her eating plan approximately 65% of the time. She states she is exercising 0 minutes 0 times per week. [name] reports that she is sometimes not eating her yogurt and fruit and is not eating all of her snack calories. She is ready to get back on track. Her weight is 292 lb (132.5 kg) today and has had a weight gain of 2 lbs since her last visit. She has lost 5 lbs since starting treatment with [place].  Hyperlipidemia [name] has hyperlipidemia and has been trying to improve her cholesterol levels with intensive lifestyle modification including a low saturated fat diet, exercise and weight loss. She denies any chest pain.  At risk for cardiovascular disease [name] is at a higher than average risk for cardiovascular disease due to obesity. She currently denies any chest pain.  Depression with emotional eating behaviors [name] is struggling with emotional eating and using food for comfort to the extent that it is negatively impacting her health. She often snacks when she is not hungry. [name] sometimes feels she is out of control and then feels guilty that she made poor food choices. She has been working on behavior modification techniques to help reduce her emotional eating and has been somewhat successful. She shows no sign of suicidal or homicidal ideations.  Depression screen [facility] 2/9 07/29/2018 09/10/2017 03/31/2016  Decreased Interest 0 3 0  Down, Depressed, Hopeless 0 2 0  PHQ - 2 Score 0 5 0  Altered sleeping 2 2 -  Tired, decreased energy 1 3 -  Change in appetite 2 3 -  Feeling bad or failure about yourself  0 3 -  Trouble concentrating 0 1 -  Moving slowly or fidgety/restless 0 1 -  Suicidal thoughts 0 0 -  PHQ-9 Score 5 18 -  Difficult doing work/chores - Very difficult -   ASSESSMENT  AND PLAN:  Other hyperlipidemia  Other depression - with emotional eating - Plan: buPROPion (WELLBUTRIN SR) 150 MG 12 hr tablet  At risk for heart disease  Class 3 severe obesity with serious comorbidity and body mass index (BMI) of 50.0 to 59.9 in adult, unspecified obesity type (HCC)  PLAN:  Hyperlipidemia [name] was informed of the American Heart Association Guidelines emphasizing intensive lifestyle modifications as the first line treatment for hyperlipidemia. We discussed many lifestyle modifications today in depth, and [name] will continue to work on decreasing saturated fats such as fatty red meat, butter and many fried foods. She will also increase vegetables and lean protein in her diet and continue to work on exercise and weight loss efforts.  Cardiovascular risk counseling [name] was given extended (15 minutes) coronary artery disease prevention counseling today. She is 38 y.o. female and has risk factors for heart disease including obesity. We discussed intensive lifestyle modifications today with an emphasis on specific weight loss instructions and strategies. Pt was also informed of the importance of increasing exercise and decreasing saturated fats to help prevent heart disease.  Depression with Emotional Eating Behaviors We discussed behavior modification techniques today to help [name] deal with her emotional eating and depression. She was given a refill on her bupropion #30 with 0 refills and agrees to follow-up with our clinic in 2 weeks.  Obesity [name] is currently in the action stage of change. As  such, her goal is to continue with weight loss efforts. She has agreed to follow the Category 4 plan. [name] has been instructed to work up to a goal of 150 minutes of combined cardio and strengthening exercise per week for weight loss and overall health benefits. We discussed the following Behavioral Modification Strategies today: increasing lean protein intake and work on  meal planning and easy cooking plans.  [name] has agreed to follow-up with our clinic in 2 weeks. She was informed of the importance of frequent follow up visits to maximize her success with intensive lifestyle modifications for her multiple health conditions.  ALLERGIES: No Known Allergies  MEDICATIONS: Current Outpatient Medications on File Prior to Visit  Medication Sig Dispense Refill  . albuterol (PROAIR HFA) 108 (90 Base) MCG/ACT inhaler Inhale 2 puffs into the lungs every 6 (six) hours as needed for wheezing or shortness of breath. 8.5 Inhaler 1  . beclomethasone (QVAR) 40 MCG/ACT inhaler Inhale 2 puffs into the lungs 2 (two) times daily. 1 Inhaler 3  . BLISOVI 24 FE 1-20 MG-MCG(24) tablet Take 1 tablet by mouth daily.    . calcium carbonate (TUMS - DOSED IN MG ELEMENTAL CALCIUM) 500 MG chewable tablet Chew 1 tablet by mouth 3 (three) times daily as needed for indigestion or heartburn.     . diphenhydrAMINE HCl, Sleep, (ZZZQUIL) 25 MG CAPS Take 25 mg by mouth at bedtime as needed (for sleep.).    [name] ibuprofen (ADVIL,MOTRIN) 600 MG tablet Take 1 tablet (600 mg total) by mouth every 6 (six) hours as needed (mild pain). 30 tablet 0  . Insulin Pen Needle (BD PEN NEEDLE NANO 2ND GEN) 32G X 4 MM MISC 1 Package by Does not apply route 2 (two) times daily. 100 each 0  . liraglutide (VICTOZA) 18 MG/3ML SOPN Inject 0.3 mLs (1.8 mg total) into the skin every morning. 3 pen 0  . metoprolol succinate (TOPROL-XL) 50 MG 24 hr tablet Take 1 tablet (50 mg total) by mouth daily. Take with or immediately following a meal. 90 tablet 3  . norethindrone-ethinyl estradiol (BLISOVI FE 1/20) 1-20 MG-MCG tablet Take 1 tablet by mouth daily. 3 Package 1  . Polyethyl Glycol-Propyl Glycol (LUBRICANT EYE DROPS) 0.4-0.3 % SOLN Place 1 drop into both eyes 3 (three) times daily as needed (for dry/allergy eyes.).    [name] sertraline (ZOLOFT) 100 MG tablet Take 100 mg by mouth daily.    . traZODone (DESYREL) 100 MG tablet Take  100 mg by mouth at bedtime.    . triamcinolone cream (KENALOG) 0.1 % Apply 1 application topically 2 (two) times daily as needed (for eczema on hands.). 30 g 3  . Vitamin D, Ergocalciferol, (DRISDOL) 1.25 MG (50000 UT) CAPS capsule Take 1 capsule (50,000 Units total) by mouth every 3 (three) days. 10 capsule 0   No current facility-administered medications on file prior to visit.     PAST MEDICAL HISTORY: Past Medical History:  Diagnosis Date  . Anxiety   . Asthma   . Dry eyes   . Dry skin   . Eczema   . Fatigue   . Fibroids   . H/O echocardiogram    only showed tachycardia  . Heartburn   . HTN (hypertension)   . Hyperlipidemia   . Palpitations   . Pre-diabetes     pre diabetes, Metformin ordered from weight loss clinic  . Rash in adult   . Shortness of breath    due to allergies  . Stress   .  Tachycardia   . Vitamin D deficiency   . Wheezing     PAST SURGICAL HISTORY: Past Surgical History:  Procedure Laterality Date  . EYE SURGERY Bilateral 2006   Lasik  . MYOMECTOMY N/A 10/30/2017   Procedure: ABDOMINAL MYOMECTOMY;  Surgeon: Lavonia Drafts, MD;  Location: [facility];  Service: Gynecology;  Laterality: N/A;    SOCIAL HISTORY: Social History   Tobacco Use  . Smoking status: Never Smoker  . Smokeless tobacco: Never Used  Substance Use Topics  . Alcohol use: No    Alcohol/week: 0.0 standard drinks  . Drug use: No    FAMILY HISTORY: Family History  Problem Relation Age of Onset  . Thyroid disease Mother   . Fibroids Mother   . Deep vein thrombosis Mother   . Cancer Maternal Grandmother        liver   . Cancer Maternal Grandfather        pancreatic  . Hyperlipidemia Father   . Hypertension Father   . Sleep apnea Father   . Obesity Father   . Cancer Paternal Grandfather        prostate   ROS: Review of Systems  Constitutional: Negative for weight loss.  Cardiovascular: Negative for chest pain.  Psychiatric/Behavioral: Negative for suicidal  ideas.       Negative for homicidal ideas.   PHYSICAL EXAM: Blood pressure 119/83, pulse (!) 102, temperature (!) 97.5 F (36.4 C), temperature source Oral, height 5\' 2"  (1.575 m), weight 292 lb (132.5 kg), last menstrual period 07/09/2018, SpO2 95 %. Body mass index is 53.41 kg/m. Physical Exam Vitals signs reviewed.  Constitutional:      Appearance: Normal appearance. She is obese.  Cardiovascular:     Rate and Rhythm: Normal rate.     Pulses: Normal pulses.  Pulmonary:     Effort: Pulmonary effort is normal.     Breath sounds: Normal breath sounds.  Musculoskeletal: Normal range of motion.  Skin:    General: Skin is warm and dry.  Neurological:     Mental Status: She is alert and oriented to person, place, and time.  Psychiatric:        Behavior: Behavior normal.        Thought Content: Thought content does not include homicidal or suicidal ideation.   RECENT LABS AND TESTS: BMET    Component Value Date/Time   NA 137 04/25/2018 1318   K 4.3 04/25/2018 1318   CL 101 04/25/2018 1318   CO2 20 04/25/2018 1318   GLUCOSE 89 04/25/2018 1318   GLUCOSE 93 10/17/2017 1548   BUN 10 04/25/2018 1318   CREATININE 0.56 (L) 04/25/2018 1318   CALCIUM 9.3 04/25/2018 1318   GFRNONAA 121 04/25/2018 1318   GFRAA 140 04/25/2018 1318   Lab Results  Component Value Date   HGBA1C 6.0 (H) 04/25/2018   HGBA1C 5.6 01/01/2018   HGBA1C 5.9 (H) 09/10/2017   Lab Results  Component Value Date   INSULIN 19.6 04/25/2018   INSULIN 19.2 01/01/2018   INSULIN 12.3 09/10/2017   CBC    Component Value Date/Time   WBC 7.8 04/25/2018 1318   WBC 13.3 (H) 10/31/2017 0516   RBC 4.80 04/25/2018 1318   RBC 4.27 10/31/2017 0516   HGB 14.3 04/25/2018 1318   HCT 43.0 04/25/2018 1318   PLT 363 04/25/2018 1318   MCV 90 04/25/2018 1318   MCH 29.8 04/25/2018 1318   MCH 31.9 10/31/2017 0516   MCHC 33.3 04/25/2018 1318   MCHC  33.8 10/31/2017 0516   RDW 12.6 04/25/2018 1318   LYMPHSABS 2.3 04/25/2018  1318   MONOABS 0.5 07/30/2017 1631   EOSABS 0.3 04/25/2018 1318   BASOSABS 0.1 04/25/2018 1318   Iron/TIBC/Ferritin/ %Sat No results found for: IRON, TIBC, FERRITIN, IRONPCTSAT Lipid Panel     Component Value Date/Time   CHOL 204 (H) 04/25/2018 1318   TRIG 118 04/25/2018 1318   HDL 48 04/25/2018 1318   CHOLHDL 4.3 04/25/2018 1318   CHOLHDL 4 07/03/2017 1609   VLDL 19.6 07/03/2017 1609   LDLCALC 132 (H) 04/25/2018 1318   Hepatic Function Panel     Component Value Date/Time   PROT 6.5 04/25/2018 1318   ALBUMIN 3.9 04/25/2018 1318   AST 15 04/25/2018 1318   ALT 16 04/25/2018 1318   ALKPHOS 86 04/25/2018 1318   BILITOT 0.3 04/25/2018 1318      Component Value Date/Time   TSH 3.340 09/10/2017 1058   TSH 3.55 07/30/2017 1631   TSH 2.28 08/17/2016 1707    Ref. Range 04/25/2018 13:18  Vitamin D, 25-Hydroxy Latest Ref Range: 30.0 - 100.0 ng/mL 26.9 (L)   OBESITY BEHAVIORAL INTERVENTION VISIT  Today's visit was #19   Starting weight: 297 lbs Starting date: 09/10/2017 Today's weight: 292 lbs Today's date: 08/07/2018 Total lbs lost to date: 5    08/07/2018  Height 5\' 2"  (1.575 m)  Weight 292 lb (132.5 kg)  BMI (Calculated) 53.39  BLOOD PRESSURE - SYSTOLIC 025  BLOOD PRESSURE - DIASTOLIC 83   Body Fat % 85.2 %  Total Body Water (lbs) 102 lbs   ASK: We discussed the diagnosis of obesity with [name] today and Marticia agreed to give [place] permission to discuss obesity behavioral modification therapy today.  ASSESS: Konya has the diagnosis of obesity and her BMI today is 53.39. Eddis is in the action stage of change.   ADVISE: Selenia was educated on the multiple health risks of obesity as well as the benefit of weight loss to improve her health. She was advised of the need for long term treatment and the importance of lifestyle modifications to improve her current health and to decrease her risk of future health problems.  AGREE: Multiple dietary modification  options and treatment options were discussed and  Poetry agreed to follow the recommendations documented in the above note.  ARRANGE: Dulcy was educated on the importance of frequent visits to treat obesity as outlined per CMS and USPSTF guidelines and agreed to schedule her next follow up appointment today.  Migdalia Dk, am acting as transcriptionist for Abby Potash, PA-C I, Abby Potash, PA-C have reviewed above note and agree with its content

## 2018-08-16 ENCOUNTER — Ambulatory Visit: Payer: BC Managed Care – PPO | Admitting: Family Medicine

## 2018-08-16 ENCOUNTER — Encounter: Payer: Self-pay | Admitting: Family Medicine

## 2018-08-16 ENCOUNTER — Other Ambulatory Visit: Payer: Self-pay

## 2018-08-16 ENCOUNTER — Ambulatory Visit: Payer: Self-pay | Admitting: *Deleted

## 2018-08-16 VITALS — BP 118/84 | HR 76 | Temp 98.1°F | Ht 62.0 in | Wt 300.0 lb

## 2018-08-16 DIAGNOSIS — G4700 Insomnia, unspecified: Secondary | ICD-10-CM | POA: Diagnosis not present

## 2018-08-16 MED ORDER — ALPRAZOLAM 0.25 MG PO TABS: 0.2500 mg | ORAL_TABLET | Freq: Every day | ORAL | 0 refills | Status: DC

## 2018-08-16 NOTE — Patient Instructions (Signed)

## 2018-08-16 NOTE — Telephone Encounter (Signed)
Pt called with complaints of dizziness and light headedness starting around 0500 on 08/16/2018; she says that when she stands up she noticed it, and it is worse when she puts her head on a pillow; she says that it feels like a migraine coming on but she has not seen an aura; recommendations made per nurse triage protocol; pt offered, and accepted appointment with Dr Etter Sjogren, [place], 08/16/2018 at 1415; she verbalized understanding; will route to office for notification.   Reason for Disposition . Taking a medicine that could cause dizziness (e.g., blood pressure medications, diuretics)  Answer Assessment - Initial Assessment Questions 1. DESCRIPTION: "Describe your dizziness."     Light headed 2. LIGHTHEADED: "Do you feel lightheaded?" (e.g., somewhat faint, woozy, weak upon standing)     Notices it when she stands up or puts her head on a pillow 3. VERTIGO: "Do you feel like either you or the room is spinning or tilting?" (i.e. vertigo)     no 4. SEVERITY: "How bad is it?"  "Do you feel like you are going to faint?" "Can you stand and walk?"   - MILD - walking normally   - MODERATE - interferes with normal activities (e.g., work, school)    - SEVERE - unable to stand, requires support to walk, feels like passing out now.      mild 5. ONSET:  "When did the dizziness begin?"     08/16/2018 at 0500 6. AGGRAVATING FACTORS: "Does anything make it worse?" (e.g., standing, change in head position)     Standing; laying head on a pillow  7. HEART RATE: "Can you tell me your heart rate?" "How many beats in 15 seconds?"  (Note: not all patients can do this)       22 beats in 15 seconds 8. CAUSE: "What do you think is causing the dizziness?"     Over tired vs stress  9. RECURRENT SYMPTOM: "Have you had dizziness before?" If so, ask: "When was the last time?" "What happened that time?"    Yes for a few minutes before getting a migraine 10. OTHER SYMPTOMS: "Do you have any other symptoms?" (e.g.,  fever, chest pain, vomiting, diarrhea, bleeding)       no 11. PREGNANCY: "Is there any chance you are pregnant?" "When was your last menstrual period?"       "I dont' think so";  LMP 07/25/2018  Protocols used: DIZZINESS [facility]

## 2018-08-16 NOTE — Progress Notes (Signed)
Patient ID: Jeanne [name], female    DOB: [birth year]  [name]: 38 y.o. MRN: [id number]    Subjective:  Subjective  HPI Jeanne [name] presents for dizziness and light headedness this am-- it has been improving since though and is almost gone.  No fever/ chills or body aches.  No congestion  She is under a lot of stress and is not sleeping well.  It is not a problem every night but several nights a week  Review of Systems  Constitutional: Negative for appetite change, diaphoresis, fatigue and unexpected weight change.  Eyes: Negative for pain, redness and visual disturbance.  Respiratory: Negative for cough, chest tightness, shortness of breath and wheezing.   Cardiovascular: Negative for chest pain, palpitations and leg swelling.  Endocrine: Negative for cold intolerance, heat intolerance, polydipsia, polyphagia and polyuria.  Genitourinary: Negative for difficulty urinating, dysuria and frequency.  Neurological: Positive for dizziness and light-headedness. Negative for numbness and headaches.  Psychiatric/Behavioral: Positive for sleep disturbance. Negative for behavioral problems and decreased concentration.    History Past Medical History:  Diagnosis Date  . Anxiety   . Asthma   . Dry eyes   . Dry skin   . Eczema   . Fatigue   . Fibroids   . H/O echocardiogram    only showed tachycardia  . Heartburn   . HTN (hypertension)   . Hyperlipidemia   . Palpitations   . Pre-diabetes     pre diabetes, Metformin ordered from weight loss clinic  . Rash in adult   . Shortness of breath    due to allergies  . Stress   . Tachycardia   . Vitamin D deficiency   . Wheezing     She has a past surgical history that includes Eye surgery (Bilateral, 2006) and Myomectomy (N/A, 10/30/2017).   Her family history includes Cancer in her maternal grandfather, maternal grandmother, and paternal grandfather; Deep vein thrombosis in her mother; Fibroids in her mother; Hyperlipidemia in her father;  Hypertension in her father; Obesity in her father; Sleep apnea in her father; Thyroid disease in her mother.She reports that she has never smoked. She has never used smokeless tobacco. She reports that she does not drink alcohol or use drugs.  Current Outpatient Medications on File Prior to Visit  Medication Sig Dispense Refill  . albuterol (PROAIR HFA) 108 (90 Base) MCG/ACT inhaler Inhale 2 puffs into the lungs every 6 (six) hours as needed for wheezing or shortness of breath. 8.5 Inhaler 1  . beclomethasone (QVAR) 40 MCG/ACT inhaler Inhale 2 puffs into the lungs 2 (two) times daily. 1 Inhaler 3  . BLISOVI 24 FE 1-20 MG-MCG(24) tablet Take 1 tablet by mouth daily.    [name] buPROPion (WELLBUTRIN SR) 150 MG 12 hr tablet Take 1 tablet (150 mg total) by mouth daily with breakfast. 30 tablet 0  . calcium carbonate (TUMS - DOSED IN MG ELEMENTAL CALCIUM) 500 MG chewable tablet Chew 1 tablet by mouth 3 (three) times daily as needed for indigestion or heartburn.     . diphenhydrAMINE HCl, Sleep, (ZZZQUIL) 25 MG CAPS Take 25 mg by mouth at bedtime as needed (for sleep.).    [name] ibuprofen (ADVIL,MOTRIN) 600 MG tablet Take 1 tablet (600 mg total) by mouth every 6 (six) hours as needed (mild pain). 30 tablet 0  . Insulin Pen Needle (BD PEN NEEDLE NANO 2ND GEN) 32G X 4 MM MISC 1 Package by Does not apply route 2 (two) times daily. 100 each 0  .  liraglutide (VICTOZA) 18 MG/3ML SOPN Inject 0.3 mLs (1.8 mg total) into the skin every morning. (Patient taking differently: Inject 1.8 mg into the skin every morning. Pt taking 1.5 ml) 3 pen 0  . metoprolol succinate (TOPROL-XL) 50 MG 24 hr tablet Take 1 tablet (50 mg total) by mouth daily. Take with or immediately following a meal. 90 tablet 3  . norethindrone-ethinyl estradiol (BLISOVI FE 1/20) 1-20 MG-MCG tablet Take 1 tablet by mouth daily. 3 Package 1  . Polyethyl Glycol-Propyl Glycol (LUBRICANT EYE DROPS) 0.4-0.3 % SOLN Place 1 drop into both eyes 3 (three) times daily as  needed (for dry/allergy eyes.).    [name] sertraline (ZOLOFT) 100 MG tablet Take 100 mg by mouth daily.    . traZODone (DESYREL) 100 MG tablet Take 100 mg by mouth at bedtime.    . triamcinolone cream (KENALOG) 0.1 % Apply 1 application topically 2 (two) times daily as needed (for eczema on hands.). 30 g 3  . Vitamin D, Ergocalciferol, (DRISDOL) 1.25 MG (50000 UT) CAPS capsule Take 1 capsule (50,000 Units total) by mouth every 3 (three) days. 10 capsule 0   No current facility-administered medications on file prior to visit.      Objective:  Objective  Physical Exam Vitals signs and nursing note reviewed.  Constitutional:      Appearance: She is well-developed.  HENT:     Head: Normocephalic and atraumatic.     Right Ear: Hearing, tympanic membrane, ear canal and external ear normal.     Left Ear: Hearing, tympanic membrane, ear canal and external ear normal.     Nose: Nose normal.     Mouth/Throat:     Pharynx: Oropharynx is clear. No pharyngeal swelling, oropharyngeal exudate, posterior oropharyngeal erythema or uvula swelling.     Tonsils: Swelling: 0 on the right. 0 on the left.  Eyes:     Conjunctiva/sclera: Conjunctivae normal.  Neck:     Musculoskeletal: Normal range of motion and neck supple.     Thyroid: No thyromegaly.     Vascular: No carotid bruit or JVD.  Cardiovascular:     Rate and Rhythm: Normal rate and regular rhythm.     Heart sounds: Normal heart sounds. No murmur.  Pulmonary:     Effort: Pulmonary effort is normal. No respiratory distress.     Breath sounds: Normal breath sounds. No wheezing or rales.  Chest:     Chest wall: No tenderness.  Neurological:     Mental Status: She is alert and oriented to person, place, and time.    BP 118/84   Pulse 76   Temp 98.1 F (36.7 C)   Ht 5\' 2"  (1.575 m)   Wt 300 lb (136.1 kg)   SpO2 98%   BMI 54.87 kg/m  Wt Readings from Last 3 Encounters:  08/16/18 300 lb (136.1 kg)  08/07/18 292 lb (132.5 kg)  07/22/18 290  lb (131.5 kg)     Lab Results  Component Value Date   WBC 7.8 04/25/2018   HGB 14.3 04/25/2018   HCT 43.0 04/25/2018   PLT 363 04/25/2018   GLUCOSE 89 04/25/2018   CHOL 204 (H) 04/25/2018   TRIG 118 04/25/2018   HDL 48 04/25/2018   LDLCALC 132 (H) 04/25/2018   ALT 16 04/25/2018   AST 15 04/25/2018   NA 137 04/25/2018   K 4.3 04/25/2018   CL 101 04/25/2018   CREATININE 0.56 (L) 04/25/2018   BUN 10 04/25/2018   CO2 20 04/25/2018  TSH 3.340 09/10/2017   HGBA1C 6.0 (H) 04/25/2018    No results found.   Assessment & Plan:  Plan  I am having Jeanne [name] start on ALPRAZolam. I am also having her maintain her calcium carbonate, sertraline, diphenhydrAMINE HCl (Sleep), Polyethyl Glycol-Propyl Glycol, ibuprofen, albuterol, Insulin Pen Needle, beclomethasone, metoprolol succinate, triamcinolone cream, Blisovi 24 Fe, norethindrone-ethinyl estradiol, Vitamin D (Ergocalciferol), liraglutide, traZODone, and buPROPion.  Meds ordered this encounter  Medications  . ALPRAZolam (XANAX) 0.25 MG tablet    Sig: Take 1 tablet (0.25 mg total) by mouth at bedtime.    Dispense:  20 tablet    Refill:  0    Problem List Items Addressed This Visit      Unprioritized   Insomnia - Primary    Xanax 0.25 only if needed and temporarily Pt is under a lot of stress at her school with covid 19 etc        Relevant Medications   ALPRAZolam (XANAX) 0.25 MG tablet      Follow-up: Return if symptoms worsen or fail to improve.  Ann Held, DO

## 2018-08-17 DIAGNOSIS — G4700 Insomnia, unspecified: Secondary | ICD-10-CM | POA: Insufficient documentation

## 2018-08-17 NOTE — Assessment & Plan Note (Signed)
Xanax 0.25 only if needed and temporarily Pt is under a lot of stress at her school with covid 19 etc

## 2018-08-19 NOTE — Progress Notes (Unsigned)
  Office: [phone]  /  Fax: [phone]    Date: 08/21/2018   Time Seen:*** Duration:*** Provider: Glennie Isle, Psy.D. Type of Session: Individual Therapy  Type of Contact: Face-to-face  Session Content: Jeanne [name] is a 38 y.o. female presenting for a follow-up appointment to address the previously established treatment goal of decreasing emotional eating. The session was initiated with the administration of the PHQ-9 and GAD-7, as well as a brief check-in. *** Jaleena was receptive to today's session as evidenced by openness to sharing, responsiveness to feedback, and ***.  Mental Status Examination: Jeanne [name] arrived on time for the appointment. She presented as appropriately dressed and groomed. Jeanne [name] appeared her stated age and demonstrated adequate orientation to time, place, person, and purpose of the appointment. She also demonstrated appropriate eye contact. No psychomotor abnormalities or behavioral peculiarities noted. Her mood was {gbmood:21757} with congruent affect. Her thought processes were logical, linear, and goal-directed. No hallucinations, delusions, bizarre thinking or behavior reported or observed. Judgment, insight, and impulse control appeared to be grossly intact. There was no evidence of paraphasias (i.e., errors in speech, gross mispronunciations, and word substitutions), repetition deficits, or disturbances in volume or prosody (i.e., rhythm and intonation). There was no evidence of attention or memory impairments. Jeanne [name] denied current suicidal and homicidal ideation, plan and intent.   Structured Assessment Results: The Patient Health Questionnaire-9 (PHQ-9) is a self-report measure that assesses symptoms and severity of depression over the course of the last two weeks. Jeanne [name] obtained a score of *** suggesting {GBPHQ9SEVERITY:21752}. Jeanne [name] finds the endorsed symptoms to be {gbphq9difficulty:21754}.  The Generalized Anxiety Disorder-7 (GAD-7) is a brief self-report  measure that assesses symptoms of anxiety over the course of the last two weeks. Jeanne [name] obtained a score of *** suggesting {gbgad7severity:21753}.  Interventions:  {Interventions:22172}  DSM-5 Diagnosis: 311 (F32.8) Other Specified Depressive Disorder, Emotional Eating Behaviors  Treatment Goal & Progress: During the initial appointment with this provider, the following treatment goal was established: decrease emotional eating. Jeanne [name] has demonstrated progress in her goal as evidenced by ***  Plan: Jeanne [name] continues to appear able and willing to participate as evidenced by engagement in reciprocal conversation, and asking questions for clarification as appropriate.*** The next appointment will be scheduled in {gbweeks:21758}. The next session will focus on reviewing learned skills, and working towards the established treatment goal.***

## 2018-08-21 ENCOUNTER — Ambulatory Visit (INDEPENDENT_AMBULATORY_CARE_PROVIDER_SITE_OTHER): Payer: Self-pay | Admitting: Psychology

## 2018-08-21 ENCOUNTER — Ambulatory Visit (INDEPENDENT_AMBULATORY_CARE_PROVIDER_SITE_OTHER): Payer: Self-pay | Admitting: Physician Assistant

## 2018-08-22 ENCOUNTER — Encounter (INDEPENDENT_AMBULATORY_CARE_PROVIDER_SITE_OTHER): Payer: Self-pay | Admitting: Physician Assistant

## 2018-08-29 ENCOUNTER — Encounter (INDEPENDENT_AMBULATORY_CARE_PROVIDER_SITE_OTHER): Payer: Self-pay

## 2018-09-09 ENCOUNTER — Ambulatory Visit: Payer: Self-pay | Admitting: Obstetrics & Gynecology

## 2018-09-10 ENCOUNTER — Telehealth (INDEPENDENT_AMBULATORY_CARE_PROVIDER_SITE_OTHER): Payer: Self-pay | Admitting: Psychology

## 2018-09-10 NOTE — Telephone Encounter (Signed)
  Office: [phone]  /  Fax: [phone]  Date of Call: September 10, 2018 Time of Call: 3:37pm Provider: Glennie Isle, PsyD  CONTENT: This provider called Dustyn to check-in and schedule an appointment, as the last appointment was canceled by Estill Bamberg due to COVID-19 concerns. A HIPAA compliant voicemail was left requesting a call back.  PLAN: This provider will wait for Tazaria to call or send a MyChart message to schedule an appointment.

## 2018-09-18 ENCOUNTER — Telehealth (INDEPENDENT_AMBULATORY_CARE_PROVIDER_SITE_OTHER): Payer: Self-pay | Admitting: Family Medicine

## 2018-09-18 NOTE — Telephone Encounter (Signed)
error 

## 2018-09-19 ENCOUNTER — Other Ambulatory Visit: Payer: Self-pay

## 2018-09-19 ENCOUNTER — Encounter (INDEPENDENT_AMBULATORY_CARE_PROVIDER_SITE_OTHER): Payer: Self-pay | Admitting: Physician Assistant

## 2018-09-19 ENCOUNTER — Ambulatory Visit (INDEPENDENT_AMBULATORY_CARE_PROVIDER_SITE_OTHER): Payer: BC Managed Care – PPO | Admitting: Physician Assistant

## 2018-09-19 DIAGNOSIS — Z6841 Body Mass Index (BMI) 40.0 and over, adult: Secondary | ICD-10-CM

## 2018-09-19 DIAGNOSIS — E559 Vitamin D deficiency, unspecified: Secondary | ICD-10-CM | POA: Diagnosis not present

## 2018-09-19 DIAGNOSIS — F3289 Other specified depressive episodes: Secondary | ICD-10-CM

## 2018-09-19 DIAGNOSIS — R7303 Prediabetes: Secondary | ICD-10-CM

## 2018-09-19 MED ORDER — BUPROPION HCL ER (SR) 200 MG PO TB12: 200.0000 mg | ORAL_TABLET | Freq: Every day | ORAL | 0 refills | Status: DC

## 2018-09-19 MED ORDER — METFORMIN HCL 500 MG PO TABS: 500.0000 mg | ORAL_TABLET | Freq: Every day | ORAL | 0 refills | Status: DC

## 2018-09-19 MED ORDER — VITAMIN D (ERGOCALCIFEROL) 1.25 MG (50000 UNIT) PO CAPS: 50000.0000 [IU] | ORAL_CAPSULE | ORAL | 0 refills | Status: DC

## 2018-09-19 NOTE — Progress Notes (Signed)
Office: [phone]  /  Fax: [phone] TeleHealth Visit:  Jeanne [name] has verbally consented to this TeleHealth visit today. The patient is located at home, the provider is located at the [employer] [facility] office. The participants in this visit include the listed provider and patient. The visit was conducted today via Webex.  HPI:   Chief Complaint: OBESITY Jeanne [name] is here to discuss her progress with her obesity treatment plan. She is on the Category 4 plan and is following her eating plan approximately 40% of the time. She states she is walking 30 minutes 7 times per week. 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 She using a meal delivery service and is not getting enough protein for dinner. We were unable to weigh the patient today for this TeleHealth visit. She states she weighed 297 lbs on 09/19/2018. She has lost 5 lbs since starting treatment with Jeanne [name].  Vitamin D deficiency Jeanne [name] has a diagnosis of Vitamin D deficiency. She is currently taking prescription Vit D and denies nausea, vomiting or muscle weakness.  Pre-Diabetes Jeanne [name] has a diagnosis of prediabetes based on her elevated Hgb A1c and was informed this puts her at greater risk of developing diabetes. She is on Victoza and doesn't feel it is helping. She continues to work on diet and exercise to decrease risk of diabetes.   Depression with emotional eating behaviors Jeanne [name] is struggling with emotional eating and using food for comfort to the extent that it is negatively impacting her health. She often snacks when she is not hungry. Jeanne [name] sometimes feels she is out of control and then feels guilty that she made poor food choices. She has been working on behavior modification techniques to help reduce her emotional eating and has been somewhat successful. She shows no sign of suicidal or homicidal ideations. She feels that Wellbutrin is no longer helping with cravings.  Depression screen [facility] 2/9 08/17/2018 07/29/2018 09/10/2017 03/31/2016  Decreased Interest 0 0 3 0  Down, Depressed, Hopeless 0 0 2 0  PHQ - 2 Score 0 0 5 0  Altered sleeping 0 2 2 -  Tired, decreased energy 0 1 3 -  Change in appetite 0 2 3 -  Feeling bad or failure about yourself  0 0 3 -  Trouble concentrating 0 0 1 -  Moving slowly or fidgety/restless 0 0 1 -  Suicidal thoughts 0 0 0 -  PHQ-9 Score 0 5 18 -  Difficult doing work/chores Not difficult at all - Very difficult -   ASSESSMENT AND PLAN:  Vitamin D deficiency - Plan: Vitamin D, Ergocalciferol, (DRISDOL) 1.25 MG (50000 UT) CAPS capsule  Prediabetes - Plan: metFORMIN (GLUCOPHAGE) 500 MG tablet  Other depression - with emotional eating - Plan: buPROPion (WELLBUTRIN SR) 200 MG 12 hr tablet  Class 3 severe obesity with serious comorbidity and body mass index (BMI) of 50.0 to 59.9 in adult, unspecified obesity type (HCC)  PLAN:  Vitamin D Deficiency Jeanne [name] was informed that low Vitamin D levels contributes to fatigue and are associated with obesity, breast, and colon cancer. She agrees to continue to take prescription Vit D @ 50,000 IU every week #4 with 0 refills and will follow-up for routine testing of Vitamin D, at least 2-3 times per year. She was informed of the risk of over-replacement of Vitamin D and agrees to not increase her dose unless she discusses this with Jeanne [name] first. Laure agrees to follow-up with our  clinic in 3 weeks.  Pre-Diabetes Jeanne [name] will continue to work on weight loss, exercise, and decreasing simple carbohydrates in her diet to help decrease the risk of diabetes. We dicussed metformin including benefits and risks. She was informed that eating too many simple carbohydrates or too many calories at one sitting increases the likelihood of GI side effects. Jeanne [name] was advised to discontinue Victoza and start metformin 500 mg 1 PO QD at lunch #30 with no refills. She agrees to follow-up with our clinic in 3 weeks.   Depression with Emotional Eating Behaviors We discussed behavior modification techniques today to help Jeanne [name] deal with her emotional eating and depression. She will increase her bupropion to 200 mg 1 PO QD #30 with 0 refills and agrees to follow-up with our clinic in 3 weeks.  Obesity Jeanne [name] is currently in the action stage of change. As such, her goal is to continue with weight loss efforts. She has agreed to follow the Category 4 plan. Jeanne [name] has been instructed to work up to a goal of 150 minutes of combined cardio and strengthening exercise per week for weight loss and overall health benefits. We discussed the following Behavioral Modification Strategies today: increasing lean protein intake, work on meal planning and easy cooking plans  Jeanne [name] has agreed to follow-up with our clinic in 3 weeks. She was informed of the importance of frequent follow-up visits to maximize her success with intensive lifestyle modifications for her multiple health conditions.  ALLERGIES: No Known Allergies  MEDICATIONS: Current Outpatient Medications on File Prior to Visit  Medication Sig Dispense Refill  . albuterol (PROAIR HFA) 108 (90 Base) MCG/ACT inhaler Inhale 2 puffs into the lungs every 6 (six) hours as needed for wheezing or shortness of breath. 8.5 Inhaler 1  . ALPRAZolam (XANAX) 0.25 MG tablet Take 1 tablet (0.25 mg total) by mouth at bedtime. 20 tablet 0  . beclomethasone (QVAR) 40 MCG/ACT inhaler Inhale 2 puffs into the lungs 2 (two) times daily. 1 Inhaler 3  . BLISOVI 24 FE 1-20 MG-MCG(24) tablet Take 1 tablet by mouth daily.    . calcium carbonate (TUMS - DOSED IN MG ELEMENTAL CALCIUM) 500 MG chewable tablet Chew 1 tablet by mouth 3 (three) times daily as needed for indigestion or heartburn.     . diphenhydrAMINE HCl, Sleep, (ZZZQUIL) 25 MG CAPS Take 25 mg by mouth at bedtime as needed (for sleep.).    [name] ibuprofen (ADVIL,MOTRIN) 600 MG tablet Take 1 tablet (600 mg total) by mouth every 6  (six) hours as needed (mild pain). 30 tablet 0  . Insulin Pen Needle (BD PEN NEEDLE NANO 2ND GEN) 32G X 4 MM MISC 1 Package by Does not apply route 2 (two) times daily. 100 each 0  . metoprolol succinate (TOPROL-XL) 50 MG 24 hr tablet Take 1 tablet (50 mg total) by mouth daily. Take with or immediately following a meal. 90 tablet 3  . norethindrone-ethinyl estradiol (BLISOVI FE 1/20) 1-20 MG-MCG tablet Take 1 tablet by mouth daily. 3 Package 1  . Polyethyl Glycol-Propyl Glycol (LUBRICANT EYE DROPS) 0.4-0.3 % SOLN Place 1 drop into both eyes 3 (three) times daily as needed (for dry/allergy eyes.).    [name] sertraline (ZOLOFT) 100 MG tablet Take 100 mg by mouth daily.    . traZODone (DESYREL) 100 MG tablet Take 100 mg by mouth at bedtime.    . triamcinolone cream (KENALOG) 0.1 % Apply 1 application topically 2 (two) times daily as needed (for eczema on hands.). 30 g  3   No current facility-administered medications on file prior to visit.     PAST MEDICAL HISTORY: Past Medical History:  Diagnosis Date  . Anxiety   . Asthma   . Dry eyes   . Dry skin   . Eczema   . Fatigue   . Fibroids   . H/O echocardiogram    only showed tachycardia  . Heartburn   . HTN (hypertension)   . Hyperlipidemia   . Palpitations   . Pre-diabetes     pre diabetes, Metformin ordered from weight loss clinic  . Rash in adult   . Shortness of breath    due to allergies  . Stress   . Tachycardia   . Vitamin D deficiency   . Wheezing     PAST SURGICAL HISTORY: Past Surgical History:  Procedure Laterality Date  . EYE SURGERY Bilateral 2006   Lasik  . MYOMECTOMY N/A 10/30/2017   Procedure: ABDOMINAL MYOMECTOMY;  Surgeon: Lavonia Drafts, MD;  Location: [facility];  Service: Gynecology;  Laterality: N/A;    SOCIAL HISTORY: Social History   Tobacco Use  . Smoking status: Never Smoker  . Smokeless tobacco: Never Used  Substance Use Topics  . Alcohol use: No    Alcohol/week: 0.0 standard drinks  . Drug  use: No    FAMILY HISTORY: Family History  Problem Relation Age of Onset  . Thyroid disease Mother   . Fibroids Mother   . Deep vein thrombosis Mother   . Cancer Maternal Grandmother        liver   . Cancer Maternal Grandfather        pancreatic  . Hyperlipidemia Father   . Hypertension Father   . Sleep apnea Father   . Obesity Father   . Cancer Paternal Grandfather        prostate   ROS: Review of Systems  Gastrointestinal: Negative for nausea and vomiting.  Musculoskeletal:       Negative for muscle weakness.  Psychiatric/Behavioral: Positive for depression (emotional eating). Negative for suicidal ideas.       Negative for homicidal ideas.   PHYSICAL EXAM: Pt in no acute distress  RECENT LABS AND TESTS: BMET    Component Value Date/Time   NA 137 04/25/2018 1318   K 4.3 04/25/2018 1318   CL 101 04/25/2018 1318   CO2 20 04/25/2018 1318   GLUCOSE 89 04/25/2018 1318   GLUCOSE 93 10/17/2017 1548   BUN 10 04/25/2018 1318   CREATININE 0.56 (L) 04/25/2018 1318   CALCIUM 9.3 04/25/2018 1318   GFRNONAA 121 04/25/2018 1318   GFRAA 140 04/25/2018 1318   Lab Results  Component Value Date   HGBA1C 6.0 (H) 04/25/2018   HGBA1C 5.6 01/01/2018   HGBA1C 5.9 (H) 09/10/2017   Lab Results  Component Value Date   INSULIN 19.6 04/25/2018   INSULIN 19.2 01/01/2018   INSULIN 12.3 09/10/2017   CBC    Component Value Date/Time   WBC 7.8 04/25/2018 1318   WBC 13.3 (H) 10/31/2017 0516   RBC 4.80 04/25/2018 1318   RBC 4.27 10/31/2017 0516   HGB 14.3 04/25/2018 1318   HCT 43.0 04/25/2018 1318   PLT 363 04/25/2018 1318   MCV 90 04/25/2018 1318   MCH 29.8 04/25/2018 1318   MCH 31.9 10/31/2017 0516   MCHC 33.3 04/25/2018 1318   MCHC 33.8 10/31/2017 0516   RDW 12.6 04/25/2018 1318   LYMPHSABS 2.3 04/25/2018 1318   MONOABS 0.5 07/30/2017 1631  EOSABS 0.3 04/25/2018 1318   BASOSABS 0.1 04/25/2018 1318   Iron/TIBC/Ferritin/ %Sat No results found for: IRON, TIBC,  FERRITIN, IRONPCTSAT Lipid Panel     Component Value Date/Time   CHOL 204 (H) 04/25/2018 1318   TRIG 118 04/25/2018 1318   HDL 48 04/25/2018 1318   CHOLHDL 4.3 04/25/2018 1318   CHOLHDL 4 07/03/2017 1609   VLDL 19.6 07/03/2017 1609   LDLCALC 132 (H) 04/25/2018 1318   Hepatic Function Panel     Component Value Date/Time   PROT 6.5 04/25/2018 1318   ALBUMIN 3.9 04/25/2018 1318   AST 15 04/25/2018 1318   ALT 16 04/25/2018 1318   ALKPHOS 86 04/25/2018 1318   BILITOT 0.3 04/25/2018 1318      Component Value Date/Time   TSH 3.340 09/10/2017 1058   TSH 3.55 07/30/2017 1631   TSH 2.28 08/17/2016 1707   Results for YEHUDIS, MONCEAUX (MRN [id number]) as of 09/19/2018 12:01  Ref. Range 04/25/2018 13:18  Vitamin D, 25-Hydroxy Latest Ref Range: 30.0 - 100.0 ng/mL 26.9 (L)   I, Michaelene Song, am acting as Location manager for [employer], PA-C I, Abby Potash, PA-C have reviewed above note and agree with its content

## 2018-09-27 ENCOUNTER — Encounter (INDEPENDENT_AMBULATORY_CARE_PROVIDER_SITE_OTHER): Payer: Self-pay | Admitting: Physician Assistant

## 2018-09-30 ENCOUNTER — Other Ambulatory Visit (INDEPENDENT_AMBULATORY_CARE_PROVIDER_SITE_OTHER): Payer: Self-pay

## 2018-09-30 DIAGNOSIS — R7303 Prediabetes: Secondary | ICD-10-CM

## 2018-09-30 MED ORDER — METFORMIN HCL 500 MG PO TABS: 500.0000 mg | ORAL_TABLET | Freq: Two times a day (BID) | ORAL | 0 refills | Status: DC

## 2018-09-30 NOTE — Telephone Encounter (Signed)
Please send her in metformin 500mg  bid #[id number]

## 2018-09-30 NOTE — Telephone Encounter (Signed)
Thank you :)

## 2018-09-30 NOTE — Telephone Encounter (Signed)
Please advise 

## 2018-09-30 NOTE — Telephone Encounter (Signed)
Done

## 2018-10-02 ENCOUNTER — Encounter: Payer: Self-pay | Admitting: Family Medicine

## 2018-10-02 ENCOUNTER — Ambulatory Visit (INDEPENDENT_AMBULATORY_CARE_PROVIDER_SITE_OTHER): Payer: BC Managed Care – PPO | Admitting: Family Medicine

## 2018-10-02 ENCOUNTER — Other Ambulatory Visit: Payer: Self-pay

## 2018-10-02 DIAGNOSIS — H6692 Otitis media, unspecified, left ear: Secondary | ICD-10-CM

## 2018-10-02 DIAGNOSIS — J302 Other seasonal allergic rhinitis: Secondary | ICD-10-CM | POA: Diagnosis not present

## 2018-10-02 MED ORDER — FLUTICASONE PROPIONATE 50 MCG/ACT NA SUSP: 2.0000 | Freq: Every day | NASAL | 6 refills | Status: DC

## 2018-10-02 MED ORDER — CEFDINIR 300 MG PO CAPS: 300.0000 mg | ORAL_CAPSULE | Freq: Two times a day (BID) | ORAL | 0 refills | Status: DC

## 2018-10-02 NOTE — Progress Notes (Signed)
Virtual Visit via Video Note  I connected with Jeanne [name] on 10/02/18 at  2:30 PM EDT by a video enabled telemedicine application and verified that I am speaking with the correct person using two identifiers.  Location: Patient: home Provider: home   I discussed the limitations of evaluation and management by telemedicine and the availability of in person appointments. The patient expressed understanding and agreed to proceed.  History of Present Illness:  Pt is home c/o L ear feeling full and plugged -- she is using otc sinus/ allergy with some relief but not with her ear.  No fever  Some coughing -- + productive  Ear symptoms x 5 days ,  Rest of the symptoms x 2 weeks      Past Medical History:  Diagnosis Date  . Anxiety   . Asthma   . Dry eyes   . Dry skin   . Eczema   . Fatigue   . Fibroids   . H/O echocardiogram    only showed tachycardia  . Heartburn   . HTN (hypertension)   . Hyperlipidemia   . Palpitations   . Pre-diabetes     pre diabetes, Metformin ordered from weight loss clinic  . Rash in adult   . Shortness of breath    due to allergies  . Stress   . Tachycardia   . Vitamin D deficiency   . Wheezing    Current Outpatient Medications on File Prior to Visit  Medication Sig Dispense Refill  . albuterol (PROAIR HFA) 108 (90 Base) MCG/ACT inhaler Inhale 2 puffs into the lungs every 6 (six) hours as needed for wheezing or shortness of breath. 8.5 Inhaler 1  . ALPRAZolam (XANAX) 0.25 MG tablet Take 1 tablet (0.25 mg total) by mouth at bedtime. 20 tablet 0  . beclomethasone (QVAR) 40 MCG/ACT inhaler Inhale 2 puffs into the lungs 2 (two) times daily. 1 Inhaler 3  . buPROPion (WELLBUTRIN SR) 200 MG 12 hr tablet Take 1 tablet (200 mg total) by mouth daily. 30 tablet 0  . calcium carbonate (TUMS - DOSED IN MG ELEMENTAL CALCIUM) 500 MG chewable tablet Chew 1 tablet by mouth 3 (three) times daily as needed for indigestion or heartburn.     . diphenhydrAMINE HCl,  Sleep, (ZZZQUIL) 25 MG CAPS Take 25 mg by mouth at bedtime as needed (for sleep.).    [name] ibuprofen (ADVIL,MOTRIN) 600 MG tablet Take 1 tablet (600 mg total) by mouth every 6 (six) hours as needed (mild pain). 30 tablet 0  . Insulin Pen Needle (BD PEN NEEDLE NANO 2ND GEN) 32G X 4 MM MISC 1 Package by Does not apply route 2 (two) times daily. 100 each 0  . metFORMIN (GLUCOPHAGE) 500 MG tablet Take 1 tablet (500 mg total) by mouth 2 (two) times daily with a meal. 60 tablet 0  . metoprolol succinate (TOPROL-XL) 50 MG 24 hr tablet Take 1 tablet (50 mg total) by mouth daily. Take with or immediately following a meal. 90 tablet 3  . Polyethyl Glycol-Propyl Glycol (LUBRICANT EYE DROPS) 0.4-0.3 % SOLN Place 1 drop into both eyes 3 (three) times daily as needed (for dry/allergy eyes.).    [name] sertraline (ZOLOFT) 100 MG tablet Take 100 mg by mouth daily.    . traZODone (DESYREL) 100 MG tablet Take 100 mg by mouth at bedtime.    . triamcinolone cream (KENALOG) 0.1 % Apply 1 application topically 2 (two) times daily as needed (for eczema on hands.). 30 g 3  .  Vitamin D, Ergocalciferol, (DRISDOL) 1.25 MG (50000 UT) CAPS capsule Take 1 capsule (50,000 Units total) by mouth every 3 (three) days. 10 capsule 0   No current facility-administered medications on file prior to visit.    Observations/Objective: Afebrile,  Normal bp per pt   Assessment and Plan: 1. Left otitis media, unspecified otitis media type abx per orders Able to see pt in office Friday if pt feels she is doing no better  - fluticasone (FLONASE) 50 MCG/ACT nasal spray; Place 2 sprays into both nostrils daily.  Dispense: 16 g; Refill: 6 - cefdinir (OMNICEF) 300 MG capsule; Take 1 capsule (300 mg total) by mouth 2 (two) times daily.  Dispense: 10 capsule; Refill: 0  2. Seasonal allergies con't with allegra otc,  Add flonase and f/u prn - fluticasone (FLONASE) 50 MCG/ACT nasal spray; Place 2 sprays into both nostrils daily.  Dispense: 16 g; Refill:  6   Follow Up Instructions:    I discussed the assessment and treatment plan with the patient. The patient was provided an opportunity to ask questions and all were answered. The patient agreed with the plan and demonstrated an understanding of the instructions.   The patient was advised to call back or seek an in-person evaluation if the symptoms worsen or if the condition fails to improve as anticipated.  I provided 20 minutes of non-face-to-face time during this encounter.   Ann Held, DO

## 2018-10-07 ENCOUNTER — Encounter (INDEPENDENT_AMBULATORY_CARE_PROVIDER_SITE_OTHER): Payer: Self-pay | Admitting: Physician Assistant

## 2018-10-07 ENCOUNTER — Other Ambulatory Visit: Payer: Self-pay

## 2018-10-07 ENCOUNTER — Ambulatory Visit (INDEPENDENT_AMBULATORY_CARE_PROVIDER_SITE_OTHER): Payer: BC Managed Care – PPO | Admitting: Physician Assistant

## 2018-10-07 DIAGNOSIS — R7303 Prediabetes: Secondary | ICD-10-CM | POA: Diagnosis not present

## 2018-10-07 DIAGNOSIS — Z6841 Body Mass Index (BMI) 40.0 and over, adult: Secondary | ICD-10-CM

## 2018-10-07 DIAGNOSIS — F3289 Other specified depressive episodes: Secondary | ICD-10-CM | POA: Diagnosis not present

## 2018-10-07 DIAGNOSIS — E559 Vitamin D deficiency, unspecified: Secondary | ICD-10-CM

## 2018-10-07 DIAGNOSIS — E66813 Obesity, class 3: Secondary | ICD-10-CM

## 2018-10-07 MED ORDER — METFORMIN HCL 500 MG PO TABS: 500.0000 mg | ORAL_TABLET | Freq: Two times a day (BID) | ORAL | 2 refills | Status: DC

## 2018-10-07 MED ORDER — BUPROPION HCL ER (SR) 200 MG PO TB12: 200.0000 mg | ORAL_TABLET | Freq: Every day | ORAL | 0 refills | Status: DC

## 2018-10-07 MED ORDER — VITAMIN D (ERGOCALCIFEROL) 1.25 MG (50000 UNIT) PO CAPS: 50000.0000 [IU] | ORAL_CAPSULE | ORAL | 0 refills | Status: DC

## 2018-10-08 NOTE — Progress Notes (Signed)
Office: [phone]  /  Fax: [phone] TeleHealth Visit:  Jeanne [name] has verbally consented to this TeleHealth visit today. The patient is located at home, the provider is located at the [employer] [facility] office. The participants in this visit include the listed provider and patient. The visit was conducted today via Webex.  HPI:   Chief Complaint: OBESITY Jeanne [name] is here to discuss her progress with her obesity treatment plan. She is on the Category 4 plan and is following her eating plan approximately 50% of the time. She states she is walking 30 minutes 3-4 times per week. Jeanne [name] reports frustration with herself and her inability to follow the plan more closely. She is now seeing a psychotherapist to help with emotional eating. We were unable to weigh the patient today for this TeleHealth visit. She feels as if she has maintained her weight since her last visit. She has lost 5 lbs since starting treatment with [place].  Vitamin D deficiency Jeanne [name] has a diagnosis of Vitamin D deficiency. She is currently taking prescription Vit D and denies nausea, vomiting or muscle weakness.  Depression with emotional eating behaviors Jeanne [name] is struggling with emotional eating and using food for comfort to the extent that it is negatively impacting her health. She often snacks when she is not hungry. Jeanne [name] sometimes feels she is out of control and then feels guilty that she made poor food choices. She has been working on behavior modification techniques to help reduce her emotional eating and has been somewhat successful. She shows no sign of suicidal or homicidal ideations. She reports no cravings but does report emotional eating.  Depression screen [facility] 2/9 08/17/2018 07/29/2018 09/10/2017 03/31/2016  Decreased Interest 0 0 3 0  Down, Depressed, Hopeless 0 0 2 0  PHQ - 2 Score 0 0 5 0  Altered sleeping 0 2 2 -  Tired, decreased energy 0 1 3 -  Change in appetite 0 2 3 -  Feeling bad or  failure about yourself  0 0 3 -  Trouble concentrating 0 0 1 -  Moving slowly or fidgety/restless 0 0 1 -  Suicidal thoughts 0 0 0 -  PHQ-9 Score 0 5 18 -  Difficult doing work/chores Not difficult at all - Very difficult -   Pre-Diabetes Jeanne [name] has a diagnosis of prediabetes based on her elevated Hgb A1c and was informed this puts her at greater risk of developing diabetes. She is taking metformin currently and reports polyphagia improved with second dose of metformin. She continues to work on diet and exercise to decrease risk of diabetes. She denies nausea, vomiting, or diarrhea.   ASSESSMENT AND PLAN:  Vitamin D deficiency - Plan: Vitamin D, Ergocalciferol, (DRISDOL) 1.25 MG (50000 UT) CAPS capsule  Prediabetes - Plan: metFORMIN (GLUCOPHAGE) 500 MG tablet  Other depression - with emotional eating - Plan: buPROPion (WELLBUTRIN SR) 200 MG 12 hr tablet  Class 3 severe obesity with serious comorbidity and body mass index (BMI) of 50.0 to 59.9 in adult, unspecified obesity type ([place])  PLAN:  Vitamin D Deficiency Jeanne [name] was informed that low Vitamin D levels contributes to fatigue and are associated with obesity, breast, and colon cancer. She agrees to continue to take prescription Vit D @ 50,000 IU every three days #10 with 0 refills and will follow-up for routine testing of Vitamin D, at least 2-3 times per year. She was informed of the risk of over-replacement of Vitamin D and agrees to not increase her dose unless  she discusses this with [place] first. Jeanne [name] agrees to follow-up with our clinic in 2 weeks.  Depression with Emotional Eating Behaviors We discussed behavior modification techniques today to help Jeanne [name] deal with her emotional eating and depression. She was given a refill on her bupropion #30 with 0 refills and agrees to follow-up with our clinic in 2 weeks.  Pre-Diabetes Jeanne [name] will continue to work on weight loss, exercise, and decreasing simple carbohydrates in her diet to  help decrease the risk of diabetes. We dicussed metformin including benefits and risks. She was informed that eating too many simple carbohydrates or too many calories at one sitting increases the likelihood of GI side effects. Jeanne [name] is currently taking metformin and a refill prescription was written today for #60 with 2 refills. Jeanne [name] agrees to follow-up with our clinic in 2 weeks.  Obesity Jeanne [name] is currently in the action stage of change. As such, her goal is to continue with weight loss efforts. She has agreed to follow the Category 4 plan. Jeanne [name] has been instructed to work up to a goal of 150 minutes of combined cardio and strengthening exercise per week for weight loss and overall health benefits. We discussed the following Behavioral Modification Strategies today: work on meal planning, easy cooking plans, and keeping healthy foods in the home.  Jeanne [name] has agreed to follow-up with our clinic in 2 weeks. She was informed of the importance of frequent follow-up visits to maximize her success with intensive lifestyle modifications for her multiple health conditions.  ALLERGIES: No Known Allergies  MEDICATIONS: Current Outpatient Medications on File Prior to Visit  Medication Sig Dispense Refill  . albuterol (PROAIR HFA) 108 (90 Base) MCG/ACT inhaler Inhale 2 puffs into the lungs every 6 (six) hours as needed for wheezing or shortness of breath. 8.5 Inhaler 1  . ALPRAZolam (XANAX) 0.25 MG tablet Take 1 tablet (0.25 mg total) by mouth at bedtime. 20 tablet 0  . beclomethasone (QVAR) 40 MCG/ACT inhaler Inhale 2 puffs into the lungs 2 (two) times daily. 1 Inhaler 3  . calcium carbonate (TUMS - DOSED IN MG ELEMENTAL CALCIUM) 500 MG chewable tablet Chew 1 tablet by mouth 3 (three) times daily as needed for indigestion or heartburn.     . cefdinir (OMNICEF) 300 MG capsule Take 1 capsule (300 mg total) by mouth 2 (two) times daily. 10 capsule 0  . diphenhydrAMINE HCl, Sleep, (ZZZQUIL) 25 MG CAPS  Take 25 mg by mouth at bedtime as needed (for sleep.).    [name] fluticasone (FLONASE) 50 MCG/ACT nasal spray Place 2 sprays into both nostrils daily. 16 g 6  . ibuprofen (ADVIL,MOTRIN) 600 MG tablet Take 1 tablet (600 mg total) by mouth every 6 (six) hours as needed (mild pain). 30 tablet 0  . Insulin Pen Needle (BD PEN NEEDLE NANO 2ND GEN) 32G X 4 MM MISC 1 Package by Does not apply route 2 (two) times daily. 100 each 0  . metoprolol succinate (TOPROL-XL) 50 MG 24 hr tablet Take 1 tablet (50 mg total) by mouth daily. Take with or immediately following a meal. 90 tablet 3  . Polyethyl Glycol-Propyl Glycol (LUBRICANT EYE DROPS) 0.4-0.3 % SOLN Place 1 drop into both eyes 3 (three) times daily as needed (for dry/allergy eyes.).    [name] sertraline (ZOLOFT) 100 MG tablet Take 100 mg by mouth daily.    . traZODone (DESYREL) 100 MG tablet Take 100 mg by mouth at bedtime.    . triamcinolone cream (KENALOG) 0.1 % Apply 1 application  topically 2 (two) times daily as needed (for eczema on hands.). 30 g 3   No current facility-administered medications on file prior to visit.     PAST MEDICAL HISTORY: Past Medical History:  Diagnosis Date  . Anxiety   . Asthma   . Dry eyes   . Dry skin   . Eczema   . Fatigue   . Fibroids   . H/O echocardiogram    only showed tachycardia  . Heartburn   . HTN (hypertension)   . Hyperlipidemia   . Palpitations   . Pre-diabetes     pre diabetes, Metformin ordered from weight loss clinic  . Rash in adult   . Shortness of breath    due to allergies  . Stress   . Tachycardia   . Vitamin D deficiency   . Wheezing     PAST SURGICAL HISTORY: Past Surgical History:  Procedure Laterality Date  . EYE SURGERY Bilateral 2006   Lasik  . MYOMECTOMY N/A 10/30/2017   Procedure: ABDOMINAL MYOMECTOMY;  Surgeon: Lavonia Drafts, MD;  Location: [facility];  Service: Gynecology;  Laterality: N/A;    SOCIAL HISTORY: Social History   Tobacco Use  . Smoking status: Never  Smoker  . Smokeless tobacco: Never Used  Substance Use Topics  . Alcohol use: No    Alcohol/week: 0.0 standard drinks  . Drug use: No    FAMILY HISTORY: Family History  Problem Relation Age of Onset  . Thyroid disease Mother   . Fibroids Mother   . Deep vein thrombosis Mother   . Cancer Maternal Grandmother        liver   . Cancer Maternal Grandfather        pancreatic  . Hyperlipidemia Father   . Hypertension Father   . Sleep apnea Father   . Obesity Father   . Cancer Paternal Grandfather        prostate   ROS: Review of Systems  Gastrointestinal: Negative for diarrhea, nausea and vomiting.  Musculoskeletal:       Negative for muscle weakness.  Psychiatric/Behavioral: Positive for depression (emotional eating). Negative for suicidal ideas.       Negative for homicidal ideas.   PHYSICAL EXAM: Pt in no acute distress  RECENT LABS AND TESTS: BMET    Component Value Date/Time   NA 137 04/25/2018 1318   K 4.3 04/25/2018 1318   CL 101 04/25/2018 1318   CO2 20 04/25/2018 1318   GLUCOSE 89 04/25/2018 1318   GLUCOSE 93 10/17/2017 1548   BUN 10 04/25/2018 1318   CREATININE 0.56 (L) 04/25/2018 1318   CALCIUM 9.3 04/25/2018 1318   GFRNONAA 121 04/25/2018 1318   GFRAA 140 04/25/2018 1318   Lab Results  Component Value Date   HGBA1C 6.0 (H) 04/25/2018   HGBA1C 5.6 01/01/2018   HGBA1C 5.9 (H) 09/10/2017   Lab Results  Component Value Date   INSULIN 19.6 04/25/2018   INSULIN 19.2 01/01/2018   INSULIN 12.3 09/10/2017   CBC    Component Value Date/Time   WBC 7.8 04/25/2018 1318   WBC 13.3 (H) 10/31/2017 0516   RBC 4.80 04/25/2018 1318   RBC 4.27 10/31/2017 0516   HGB 14.3 04/25/2018 1318   HCT 43.0 04/25/2018 1318   PLT 363 04/25/2018 1318   MCV 90 04/25/2018 1318   MCH 29.8 04/25/2018 1318   MCH 31.9 10/31/2017 0516   MCHC 33.3 04/25/2018 1318   MCHC 33.8 10/31/2017 0516   RDW 12.6 04/25/2018 1318  LYMPHSABS 2.3 04/25/2018 1318   MONOABS 0.5  07/30/2017 1631   EOSABS 0.3 04/25/2018 1318   BASOSABS 0.1 04/25/2018 1318   Iron/TIBC/Ferritin/ %Sat No results found for: IRON, TIBC, FERRITIN, IRONPCTSAT Lipid Panel     Component Value Date/Time   CHOL 204 (H) 04/25/2018 1318   TRIG 118 04/25/2018 1318   HDL 48 04/25/2018 1318   CHOLHDL 4.3 04/25/2018 1318   CHOLHDL 4 07/03/2017 1609   VLDL 19.6 07/03/2017 1609   LDLCALC 132 (H) 04/25/2018 1318   Hepatic Function Panel     Component Value Date/Time   PROT 6.5 04/25/2018 1318   ALBUMIN 3.9 04/25/2018 1318   AST 15 04/25/2018 1318   ALT 16 04/25/2018 1318   ALKPHOS 86 04/25/2018 1318   BILITOT 0.3 04/25/2018 1318      Component Value Date/Time   TSH 3.340 09/10/2017 1058   TSH 3.55 07/30/2017 1631   TSH 2.28 08/17/2016 1707    Results for ALIANYS, CHACKO (MRN [id number]) as of 10/08/2018 08:49  Ref. Range 04/25/2018 13:18  Vitamin D, 25-Hydroxy Latest Ref Range: 30.0 - 100.0 ng/mL 26.9 (L)    I, Michaelene Song, am acting as Location manager for [employer], PA-C I, Abby Potash, PA-C have reviewed above note and agree with its content

## 2018-10-11 ENCOUNTER — Other Ambulatory Visit (INDEPENDENT_AMBULATORY_CARE_PROVIDER_SITE_OTHER): Payer: Self-pay | Admitting: Physician Assistant

## 2018-10-11 DIAGNOSIS — R7303 Prediabetes: Secondary | ICD-10-CM

## 2018-10-16 ENCOUNTER — Ambulatory Visit (INDEPENDENT_AMBULATORY_CARE_PROVIDER_SITE_OTHER): Payer: BC Managed Care – PPO | Admitting: Obstetrics & Gynecology

## 2018-10-16 ENCOUNTER — Encounter: Payer: Self-pay | Admitting: Obstetrics & Gynecology

## 2018-10-16 DIAGNOSIS — N946 Dysmenorrhea, unspecified: Secondary | ICD-10-CM

## 2018-10-16 NOTE — Progress Notes (Signed)
Patient verified with name and dob. Patient agrees to [facility] visit today. [name] RN

## 2018-10-16 NOTE — Progress Notes (Signed)
   TELEHEALTH [facility] GYNECOLOGY VISIT ENCOUNTER NOTE  I connected with [name] on 10/16/18 at  2:30 PM EDT by WebEx at home and verified that I am speaking with the correct person using two identifiers.   I discussed the limitations, risks, security and privacy concerns of performing an evaluation and management service by telephone and the availability of in person appointments. I also discussed with the patient that there may be a patient responsible charge related to this service. The patient expressed understanding and agreed to proceed.   History:  Jeanne [name] is a 38 y.o. G0P0000 female being evaluated today for f/u of AUB  Pt stopped OCPs from 08/15/2017 to mid Mar. Pt is interested in becoming pregnant now. Her last cycle was abnormal and she had some cramping. She just wanted to make sure that this was ok.  Pt had questions about fertility and most likely time she is most fertile.         Past Medical History:  Diagnosis Date  . Anxiety   . Asthma   . Dry eyes   . Dry skin   . Eczema   . Fatigue   . Fibroids   . H/O echocardiogram    only showed tachycardia  . Heartburn   . HTN (hypertension)   . Hyperlipidemia   . Palpitations   . Pre-diabetes     pre diabetes, Metformin ordered from weight loss clinic  . Rash in adult   . Shortness of breath    due to allergies  . Stress   . Tachycardia   . Vitamin D deficiency   . Wheezing    Past Surgical History:  Procedure Laterality Date  . EYE SURGERY Bilateral 2006   Lasik  . MYOMECTOMY N/A 10/30/2017   Procedure: ABDOMINAL MYOMECTOMY;  Surgeon: Lavonia Drafts, MD;  Location: [facility];  Service: Gynecology;  Laterality: N/A;   The following portions of the patient's history were reviewed and updated as appropriate: allergies, current medications, past family history, past medical history, past social history, past surgical history and problem list.    Review of Systems:  Pertinent items noted in HPI and  remainder of comprehensive ROS otherwise negative.  Physical Exam:   General:  Alert, oriented and cooperative. Patient appears to be in no acute distress.  Mental Status: Normal mood and affect. Normal behavior. Normal judgment and thought content.   Respiratory: Normal respiratory effort, no problems with respiration noted  Rest of physical exam deferred due to type of encounter    Assessment and Plan:     Dysmenorrhea   NSAIDS prn  Fertility   Reviewed timing of fertility      I discussed the assessment and treatment plan with the patient. The patient was provided an opportunity to ask questions and all were answered. The patient agreed with the plan and demonstrated an understanding of the instructions.   The patient was advised to call back or seek an in-person evaluation/go to the ED if the symptoms worsen or if the condition fails to improve as anticipated.  I provided 18 minutes of face-to-face via WebEx time during this encounter.   Lavonia Drafts, MD [facility] [employer] [facility], [place]

## 2018-10-17 ENCOUNTER — Encounter: Payer: Self-pay | Admitting: Obstetrics & Gynecology

## 2018-10-22 ENCOUNTER — Other Ambulatory Visit: Payer: Self-pay

## 2018-10-22 ENCOUNTER — Ambulatory Visit (INDEPENDENT_AMBULATORY_CARE_PROVIDER_SITE_OTHER): Payer: BC Managed Care – PPO | Admitting: Physician Assistant

## 2018-10-22 DIAGNOSIS — E66813 Obesity, class 3: Secondary | ICD-10-CM

## 2018-10-22 DIAGNOSIS — E559 Vitamin D deficiency, unspecified: Secondary | ICD-10-CM

## 2018-10-22 DIAGNOSIS — Z6841 Body Mass Index (BMI) 40.0 and over, adult: Secondary | ICD-10-CM

## 2018-10-22 NOTE — Progress Notes (Signed)
Office: [phone]  /  Fax: [phone] TeleHealth Visit:  Jeanne [name] has verbally consented to this TeleHealth visit today. The patient is located at home, the provider is located at the [employer] [facility] office. The participants in this visit include the listed provider and patient. The visit was conducted today via webex.  HPI:   Chief Complaint: OBESITY Jeanne [name] is here to discuss her progress with her obesity treatment plan. She is on the Category 4 plan and is following her eating plan approximately 50 % of the time. She states she is walking for 30 minutes 2 times per week. Jeanne [name] reports that her most recent weight is 299 lbs. She continues to do some emotional eating. She is seeing a Management consultant. She is not currently following the plan, but would like to work on small goals to help with eating on the plan.  We were unable to weigh the patient today for this TeleHealth visit. She feels as if she has gained weight since her last visit. She has lost 5 lbs since starting treatment with [place].  Vitamin D Deficiency Jeanne [name] has a diagnosis of vitamin D deficiency. She is currently taking prescription Vit D and denies nausea, vomiting or muscle weakness.  ASSESSMENT AND PLAN:  Vitamin D deficiency  Class 3 severe obesity with serious comorbidity and body mass index (BMI) of 50.0 to 59.9 in adult, unspecified obesity type ([place])  PLAN:  Vitamin D Deficiency Jeanne [name] was informed that low vitamin D levels contributes to fatigue and are associated with obesity, breast, and colon cancer. Jeanne [name] agrees to continue taking prescription Vit D @50 ,000 IU every 3 days and will follow up for routine testing of vitamin D, at least 2-3 times per year. She was informed of the risk of over-replacement of vitamin D and agrees to not increase her dose unless she discusses this with [place] first. Jeanne [name] agrees to follow up with our clinic in 2 weeks.  Obesity Jeanne [name] is currently in the action  stage of change. As such, her goal is to continue with weight loss efforts She has agreed to follow the Category 4 plan Jeanne [name] has been instructed to work up to a goal of 150 minutes of combined cardio and strengthening exercise per week for weight loss and overall health benefits. We discussed the following Behavioral Modification Strategies today: work on meal planning and easy cooking plans and keeping healthy foods in the home   Jeanne [name] has agreed to follow up with our clinic in 2 weeks. She was informed of the importance of frequent follow up visits to maximize her success with intensive lifestyle modifications for her multiple health conditions.  ALLERGIES: No Known Allergies  MEDICATIONS: Current Outpatient Medications on File Prior to Visit  Medication Sig Dispense Refill  . albuterol (PROAIR HFA) 108 (90 Base) MCG/ACT inhaler Inhale 2 puffs into the lungs every 6 (six) hours as needed for wheezing or shortness of breath. 8.5 Inhaler 1  . ALPRAZolam (XANAX) 0.25 MG tablet Take 1 tablet (0.25 mg total) by mouth at bedtime. 20 tablet 0  . beclomethasone (QVAR) 40 MCG/ACT inhaler Inhale 2 puffs into the lungs 2 (two) times daily. 1 Inhaler 3  . buPROPion (WELLBUTRIN SR) 200 MG 12 hr tablet Take 1 tablet (200 mg total) by mouth daily. 30 tablet 0  . calcium carbonate (TUMS - DOSED IN MG ELEMENTAL CALCIUM) 500 MG chewable tablet Chew 1 tablet by mouth 3 (three) times daily as needed for indigestion or heartburn.     [name]  cefdinir (OMNICEF) 300 MG capsule Take 1 capsule (300 mg total) by mouth 2 (two) times daily. 10 capsule 0  . diphenhydrAMINE HCl, Sleep, (ZZZQUIL) 25 MG CAPS Take 25 mg by mouth at bedtime as needed (for sleep.).    [name] fluticasone (FLONASE) 50 MCG/ACT nasal spray Place 2 sprays into both nostrils daily. 16 g 6  . ibuprofen (ADVIL,MOTRIN) 600 MG tablet Take 1 tablet (600 mg total) by mouth every 6 (six) hours as needed (mild pain). 30 tablet 0  . Insulin Pen Needle (BD PEN  NEEDLE NANO 2ND GEN) 32G X 4 MM MISC 1 Package by Does not apply route 2 (two) times daily. 100 each 0  . metFORMIN (GLUCOPHAGE) 500 MG tablet Take 1 tablet (500 mg total) by mouth 2 (two) times daily with a meal. 60 tablet 2  . metoprolol succinate (TOPROL-XL) 50 MG 24 hr tablet Take 1 tablet (50 mg total) by mouth daily. Take with or immediately following a meal. 90 tablet 3  . Polyethyl Glycol-Propyl Glycol (LUBRICANT EYE DROPS) 0.4-0.3 % SOLN Place 1 drop into both eyes 3 (three) times daily as needed (for dry/allergy eyes.).    [name] sertraline (ZOLOFT) 100 MG tablet Take 100 mg by mouth daily.    . traZODone (DESYREL) 100 MG tablet Take 100 mg by mouth at bedtime.    . triamcinolone cream (KENALOG) 0.1 % Apply 1 application topically 2 (two) times daily as needed (for eczema on hands.). 30 g 3  . Vitamin D, Ergocalciferol, (DRISDOL) 1.25 MG (50000 UT) CAPS capsule Take 1 capsule (50,000 Units total) by mouth every 3 (three) days. 10 capsule 0   No current facility-administered medications on file prior to visit.     PAST MEDICAL HISTORY: Past Medical History:  Diagnosis Date  . Anxiety   . Asthma   . Dry eyes   . Dry skin   . Eczema   . Fatigue   . Fibroids   . H/O echocardiogram    only showed tachycardia  . Heartburn   . HTN (hypertension)   . Hyperlipidemia   . Palpitations   . Pre-diabetes     pre diabetes, Metformin ordered from weight loss clinic  . Rash in adult   . Shortness of breath    due to allergies  . Stress   . Tachycardia   . Vitamin D deficiency   . Wheezing     PAST SURGICAL HISTORY: Past Surgical History:  Procedure Laterality Date  . EYE SURGERY Bilateral 2006   Lasik  . MYOMECTOMY N/A 10/30/2017   Procedure: ABDOMINAL MYOMECTOMY;  Surgeon: Lavonia Drafts, MD;  Location: [facility];  Service: Gynecology;  Laterality: N/A;    SOCIAL HISTORY: Social History   Tobacco Use  . Smoking status: Never Smoker  . Smokeless tobacco: Never Used   Substance Use Topics  . Alcohol use: No    Alcohol/week: 0.0 standard drinks  . Drug use: No    FAMILY HISTORY: Family History  Problem Relation Age of Onset  . Thyroid disease Mother   . Fibroids Mother   . Deep vein thrombosis Mother   . Cancer Maternal Grandmother        liver   . Cancer Maternal Grandfather        pancreatic  . Hyperlipidemia Father   . Hypertension Father   . Sleep apnea Father   . Obesity Father   . Cancer Paternal Grandfather        prostate    ROS:  Review of Systems  Constitutional: Negative for weight loss.  Gastrointestinal: Negative for nausea and vomiting.  Musculoskeletal:       Negative muscle weakness    PHYSICAL EXAM: Pt in no acute distress  RECENT LABS AND TESTS: BMET    Component Value Date/Time   NA 137 04/25/2018 1318   K 4.3 04/25/2018 1318   CL 101 04/25/2018 1318   CO2 20 04/25/2018 1318   GLUCOSE 89 04/25/2018 1318   GLUCOSE 93 10/17/2017 1548   BUN 10 04/25/2018 1318   CREATININE 0.56 (L) 04/25/2018 1318   CALCIUM 9.3 04/25/2018 1318   GFRNONAA 121 04/25/2018 1318   GFRAA 140 04/25/2018 1318   Lab Results  Component Value Date   HGBA1C 6.0 (H) 04/25/2018   HGBA1C 5.6 01/01/2018   HGBA1C 5.9 (H) 09/10/2017   Lab Results  Component Value Date   INSULIN 19.6 04/25/2018   INSULIN 19.2 01/01/2018   INSULIN 12.3 09/10/2017   CBC    Component Value Date/Time   WBC 7.8 04/25/2018 1318   WBC 13.3 (H) 10/31/2017 0516   RBC 4.80 04/25/2018 1318   RBC 4.27 10/31/2017 0516   HGB 14.3 04/25/2018 1318   HCT 43.0 04/25/2018 1318   PLT 363 04/25/2018 1318   MCV 90 04/25/2018 1318   MCH 29.8 04/25/2018 1318   MCH 31.9 10/31/2017 0516   MCHC 33.3 04/25/2018 1318   MCHC 33.8 10/31/2017 0516   RDW 12.6 04/25/2018 1318   LYMPHSABS 2.3 04/25/2018 1318   MONOABS 0.5 07/30/2017 1631   EOSABS 0.3 04/25/2018 1318   BASOSABS 0.1 04/25/2018 1318   Iron/TIBC/Ferritin/ %Sat No results found for: IRON, TIBC, FERRITIN,  IRONPCTSAT Lipid Panel     Component Value Date/Time   CHOL 204 (H) 04/25/2018 1318   TRIG 118 04/25/2018 1318   HDL 48 04/25/2018 1318   CHOLHDL 4.3 04/25/2018 1318   CHOLHDL 4 07/03/2017 1609   VLDL 19.6 07/03/2017 1609   LDLCALC 132 (H) 04/25/2018 1318   Hepatic Function Panel     Component Value Date/Time   PROT 6.5 04/25/2018 1318   ALBUMIN 3.9 04/25/2018 1318   AST 15 04/25/2018 1318   ALT 16 04/25/2018 1318   ALKPHOS 86 04/25/2018 1318   BILITOT 0.3 04/25/2018 1318      Component Value Date/Time   TSH 3.340 09/10/2017 1058   TSH 3.55 07/30/2017 1631   TSH 2.28 08/17/2016 1707      I, Trixie Dredge, am acting as transcriptionist for Abby Potash, PA-C I, Abby Potash, PA-C have reviewed above note and agree with its content

## 2018-11-05 ENCOUNTER — Ambulatory Visit (INDEPENDENT_AMBULATORY_CARE_PROVIDER_SITE_OTHER): Payer: BC Managed Care – PPO | Admitting: Physician Assistant

## 2018-11-05 ENCOUNTER — Other Ambulatory Visit: Payer: Self-pay

## 2018-11-08 ENCOUNTER — Encounter (INDEPENDENT_AMBULATORY_CARE_PROVIDER_SITE_OTHER): Payer: Self-pay

## 2018-11-08 ENCOUNTER — Other Ambulatory Visit (INDEPENDENT_AMBULATORY_CARE_PROVIDER_SITE_OTHER): Payer: Self-pay | Admitting: Physician Assistant

## 2018-11-08 DIAGNOSIS — F3289 Other specified depressive episodes: Secondary | ICD-10-CM

## 2018-11-11 ENCOUNTER — Encounter (INDEPENDENT_AMBULATORY_CARE_PROVIDER_SITE_OTHER): Payer: Self-pay | Admitting: Physician Assistant

## 2018-11-11 DIAGNOSIS — R7303 Prediabetes: Secondary | ICD-10-CM

## 2018-11-11 DIAGNOSIS — F3289 Other specified depressive episodes: Secondary | ICD-10-CM

## 2018-11-14 MED ORDER — METFORMIN HCL 500 MG PO TABS: 500.0000 mg | ORAL_TABLET | Freq: Two times a day (BID) | ORAL | 2 refills | Status: DC

## 2018-11-14 MED ORDER — BUPROPION HCL ER (SR) 200 MG PO TB12: 200.0000 mg | ORAL_TABLET | Freq: Every day | ORAL | 0 refills | Status: DC

## 2018-11-21 ENCOUNTER — Encounter (INDEPENDENT_AMBULATORY_CARE_PROVIDER_SITE_OTHER): Payer: Self-pay | Admitting: Physician Assistant

## 2018-11-21 ENCOUNTER — Other Ambulatory Visit: Payer: Self-pay

## 2018-11-21 ENCOUNTER — Ambulatory Visit (INDEPENDENT_AMBULATORY_CARE_PROVIDER_SITE_OTHER): Payer: BC Managed Care – PPO | Admitting: Physician Assistant

## 2018-11-21 DIAGNOSIS — Z6841 Body Mass Index (BMI) 40.0 and over, adult: Secondary | ICD-10-CM

## 2018-11-21 DIAGNOSIS — R7303 Prediabetes: Secondary | ICD-10-CM

## 2018-11-21 DIAGNOSIS — E559 Vitamin D deficiency, unspecified: Secondary | ICD-10-CM

## 2018-11-21 MED ORDER — VITAMIN D (ERGOCALCIFEROL) 1.25 MG (50000 UNIT) PO CAPS: 50000.0000 [IU] | ORAL_CAPSULE | ORAL | 0 refills | Status: DC

## 2018-11-21 MED ORDER — NORETHIN ACE-ETH ESTRAD-FE 1-20 MG-MCG(24) PO TABS: 1.0000 | ORAL_TABLET | Freq: Every day | ORAL | 11 refills | Status: DC

## 2018-11-21 NOTE — Progress Notes (Signed)
Office: [phone]  /  Fax: [phone] TeleHealth Visit:  Jeanne [name] has verbally consented to this TeleHealth visit today. The patient is located at home, the provider is located at the [facility] office. The participants in this visit include the listed provider and patient. The visit was conducted today via Webex.  HPI:   Chief Complaint: OBESITY Jeanne [name] is here to discuss her progress with her obesity treatment plan. She is on the Category 4 plan and is following her eating plan approximately 0% of the time. She states she is exercising 0 minutes 0 times per week. Jeanne [name] reports that she has not been following the plan due to increased anxiety over the past few weeks. She is going to visit her sister next week. We were unable to weigh the patient today for this TeleHealth visit. She feels as if she has gained 5 lbs since her last visit. She has lost 5 lbs since starting treatment with [place].  Vitamin D deficiency Jeanne [name] has a diagnosis of Vitamin D deficiency. She is currently taking prescription Vit D and denies nausea, vomiting or muscle weakness.  Pre-Diabetes Jeanne [name] has a diagnosis of prediabetes based on her elevated Hgb A1c and was informed this puts her at greater risk of developing diabetes. She is taking metformin currently and continues to work on diet and exercise to decrease risk of diabetes. She denies nausea, vomiting, or diarrhea. No polyphagia.  ASSESSMENT AND PLAN:  Vitamin D deficiency - Plan: Vitamin D, Ergocalciferol, (DRISDOL) 1.25 MG (50000 UT) CAPS capsule  Prediabetes  Class 3 severe obesity with serious comorbidity and body mass index (BMI) of 50.0 to 59.9 in adult, unspecified obesity type ([place])  PLAN:  Vitamin D Deficiency Jeanne [name] was informed that low Vitamin D levels contributes to fatigue and are associated with obesity, breast, and colon cancer. She agrees to continue to take prescription Vit D @ 50,000 IU every three days #10  with 0 refills and will follow-up for routine testing of Vitamin D, at least 2-3 times per year. She was informed of the risk of over-replacement of Vitamin D and agrees to not increase her dose unless she discusses this with [place] first. Jeanne [name] agrees to follow-up with our clinic in 4 weeks.  Pre-Diabetes Jeanne [name] will continue to work on weight loss, exercise, and decreasing simple carbohydrates in her diet to help decrease the risk of diabetes. We dicussed metformin including benefits and risks. She was informed that eating too many simple carbohydrates or too many calories at one sitting increases the likelihood of GI side effects. Jeanne [name] will continue metformin and follow-up with [place] as directed to monitor her progress.  Obesity Jeanne [name] is currently in the action stage of change. As such, her goal is to continue with weight loss efforts. She has agreed to change to the [facility] plan.  Jeanne [name] has been instructed to work up to a goal of 150 minutes of combined cardio and strengthening exercise per week for weight loss and overall health benefits. We discussed the following Behavioral Modification Strategies today: work on meal planning, easy cooking plans, and emotional eating strategies.  Jeanne [name] has agreed to follow-up with our clinic in 4 weeks and will be fasting for labs. She was informed of the importance of frequent follow-up visits to maximize her success with intensive lifestyle modifications for her multiple health conditions.  I spent > than 50% of the 25 minute visit on counseling as documented in the note.   ALLERGIES: No Known Allergies  MEDICATIONS: Current Outpatient Medications on File Prior to Visit  Medication Sig Dispense Refill   albuterol (PROAIR HFA) 108 (90 Base) MCG/ACT inhaler Inhale 2 puffs into the lungs every 6 (six) hours as needed for wheezing or shortness of breath. 8.5 Inhaler 1   ALPRAZolam (XANAX) 0.25 MG tablet Take 1 tablet (0.25 mg total) by mouth at bedtime.  20 tablet 0   beclomethasone (QVAR) 40 MCG/ACT inhaler Inhale 2 puffs into the lungs 2 (two) times daily. 1 Inhaler 3   buPROPion (WELLBUTRIN SR) 200 MG 12 hr tablet Take 1 tablet (200 mg total) by mouth daily. 30 tablet 0   calcium carbonate (TUMS - DOSED IN MG ELEMENTAL CALCIUM) 500 MG chewable tablet Chew 1 tablet by mouth 3 (three) times daily as needed for indigestion or heartburn.      cefdinir (OMNICEF) 300 MG capsule Take 1 capsule (300 mg total) by mouth 2 (two) times daily. 10 capsule 0   diphenhydrAMINE HCl, Sleep, (ZZZQUIL) 25 MG CAPS Take 25 mg by mouth at bedtime as needed (for sleep.).     fluticasone (FLONASE) 50 MCG/ACT nasal spray Place 2 sprays into both nostrils daily. 16 g 6   ibuprofen (ADVIL,MOTRIN) 600 MG tablet Take 1 tablet (600 mg total) by mouth every 6 (six) hours as needed (mild pain). 30 tablet 0   Insulin Pen Needle (BD PEN NEEDLE NANO 2ND GEN) 32G X 4 MM MISC 1 Package by Does not apply route 2 (two) times daily. 100 each 0   metFORMIN (GLUCOPHAGE) 500 MG tablet Take 1 tablet (500 mg total) by mouth 2 (two) times daily with a meal. 60 tablet 2   metoprolol succinate (TOPROL-XL) 50 MG 24 hr tablet Take 1 tablet (50 mg total) by mouth daily. Take with or immediately following a meal. 90 tablet 3   Polyethyl Glycol-Propyl Glycol (LUBRICANT EYE DROPS) 0.4-0.3 % SOLN Place 1 drop into both eyes 3 (three) times daily as needed (for dry/allergy eyes.).     sertraline (ZOLOFT) 100 MG tablet Take 100 mg by mouth daily.     traZODone (DESYREL) 100 MG tablet Take 100 mg by mouth at bedtime.     triamcinolone cream (KENALOG) 0.1 % Apply 1 application topically 2 (two) times daily as needed (for eczema on hands.). 30 g 3   No current facility-administered medications on file prior to visit.     PAST MEDICAL HISTORY: Past Medical History:  Diagnosis Date   Anxiety    Asthma    Dry eyes    Dry skin    Eczema    Fatigue    Fibroids    H/O  echocardiogram    only showed tachycardia   Heartburn    HTN (hypertension)    Hyperlipidemia    Palpitations    Pre-diabetes     pre diabetes, Metformin ordered from weight loss clinic   Rash in adult    Shortness of breath    due to allergies   Stress    Tachycardia    Vitamin D deficiency    Wheezing     PAST SURGICAL HISTORY: Past Surgical History:  Procedure Laterality Date   EYE SURGERY Bilateral 2006   Lasik   MYOMECTOMY N/A 10/30/2017   Procedure: ABDOMINAL MYOMECTOMY;  Surgeon: Lavonia Drafts, MD;  Location: [facility];  Service: Gynecology;  Laterality: N/A;    SOCIAL HISTORY: Social History   Tobacco Use   Smoking status: Never Smoker   Smokeless tobacco: Never Used  Substance  Use Topics   Alcohol use: No    Alcohol/week: 0.0 standard drinks   Drug use: No    FAMILY HISTORY: Family History  Problem Relation Age of Onset   Thyroid disease Mother    Fibroids Mother    Deep vein thrombosis Mother    Cancer Maternal Grandmother        liver    Cancer Maternal Grandfather        pancreatic   Hyperlipidemia Father    Hypertension Father    Sleep apnea Father    Obesity Father    Cancer Paternal Grandfather        prostate   ROS: Review of Systems  Gastrointestinal: Negative for diarrhea, nausea and vomiting.  Musculoskeletal:       Negative for muscle weakness.  Endo/Heme/Allergies:       Negative for polyphagia.   PHYSICAL EXAM: Pt in no acute distress  RECENT LABS AND TESTS: BMET    Component Value Date/Time   NA 137 04/25/2018 1318   K 4.3 04/25/2018 1318   CL 101 04/25/2018 1318   CO2 20 04/25/2018 1318   GLUCOSE 89 04/25/2018 1318   GLUCOSE 93 10/17/2017 1548   BUN 10 04/25/2018 1318   CREATININE 0.56 (L) 04/25/2018 1318   CALCIUM 9.3 04/25/2018 1318   GFRNONAA 121 04/25/2018 1318   GFRAA 140 04/25/2018 1318   Lab Results  Component Value Date   HGBA1C 6.0 (H) 04/25/2018   HGBA1C 5.6  01/01/2018   HGBA1C 5.9 (H) 09/10/2017   Lab Results  Component Value Date   INSULIN 19.6 04/25/2018   INSULIN 19.2 01/01/2018   INSULIN 12.3 09/10/2017   CBC    Component Value Date/Time   WBC 7.8 04/25/2018 1318   WBC 13.3 (H) 10/31/2017 0516   RBC 4.80 04/25/2018 1318   RBC 4.27 10/31/2017 0516   HGB 14.3 04/25/2018 1318   HCT 43.0 04/25/2018 1318   PLT 363 04/25/2018 1318   MCV 90 04/25/2018 1318   MCH 29.8 04/25/2018 1318   MCH 31.9 10/31/2017 0516   MCHC 33.3 04/25/2018 1318   MCHC 33.8 10/31/2017 0516   RDW 12.6 04/25/2018 1318   LYMPHSABS 2.3 04/25/2018 1318   MONOABS 0.5 07/30/2017 1631   EOSABS 0.3 04/25/2018 1318   BASOSABS 0.1 04/25/2018 1318   Iron/TIBC/Ferritin/ %Sat No results found for: IRON, TIBC, FERRITIN, IRONPCTSAT Lipid Panel     Component Value Date/Time   CHOL 204 (H) 04/25/2018 1318   TRIG 118 04/25/2018 1318   HDL 48 04/25/2018 1318   CHOLHDL 4.3 04/25/2018 1318   CHOLHDL 4 07/03/2017 1609   VLDL 19.6 07/03/2017 1609   LDLCALC 132 (H) 04/25/2018 1318   Hepatic Function Panel     Component Value Date/Time   PROT 6.5 04/25/2018 1318   ALBUMIN 3.9 04/25/2018 1318   AST 15 04/25/2018 1318   ALT 16 04/25/2018 1318   ALKPHOS 86 04/25/2018 1318   BILITOT 0.3 04/25/2018 1318      Component Value Date/Time   TSH 3.340 09/10/2017 1058   TSH 3.55 07/30/2017 1631   TSH 2.28 08/17/2016 1707   Results for KASHIRA, BEHUNIN (MRN [id number]) as of 11/21/2018 14:38  Ref. Range 04/25/2018 13:18  Vitamin D, 25-Hydroxy Latest Ref Range: 30.0 - 100.0 ng/mL 26.9 (L)    I, Michaelene Song, am acting as Location manager for [employer], PA-C

## 2018-11-25 ENCOUNTER — Ambulatory Visit: Payer: BC Managed Care – PPO | Admitting: Psychiatry

## 2018-11-25 ENCOUNTER — Other Ambulatory Visit: Payer: Self-pay

## 2018-11-25 DIAGNOSIS — R69 Illness, unspecified: Secondary | ICD-10-CM

## 2018-11-25 NOTE — Progress Notes (Signed)
Attempted to reach pt for scheduled telephone. No answer. Left message for pt to call office to re-schedule.

## 2018-12-12 ENCOUNTER — Other Ambulatory Visit: Payer: Self-pay

## 2018-12-12 ENCOUNTER — Ambulatory Visit (INDEPENDENT_AMBULATORY_CARE_PROVIDER_SITE_OTHER): Payer: BC Managed Care – PPO | Admitting: Physician Assistant

## 2018-12-12 ENCOUNTER — Encounter (INDEPENDENT_AMBULATORY_CARE_PROVIDER_SITE_OTHER): Payer: Self-pay | Admitting: Physician Assistant

## 2018-12-12 VITALS — BP 106/73 | HR 79 | Temp 97.9°F | Ht 62.0 in | Wt 305.0 lb

## 2018-12-12 DIAGNOSIS — Z9189 Other specified personal risk factors, not elsewhere classified: Secondary | ICD-10-CM

## 2018-12-12 DIAGNOSIS — E559 Vitamin D deficiency, unspecified: Secondary | ICD-10-CM | POA: Diagnosis not present

## 2018-12-12 DIAGNOSIS — E66813 Obesity, class 3: Secondary | ICD-10-CM

## 2018-12-12 DIAGNOSIS — R7303 Prediabetes: Secondary | ICD-10-CM

## 2018-12-12 DIAGNOSIS — E7849 Other hyperlipidemia: Secondary | ICD-10-CM | POA: Diagnosis not present

## 2018-12-12 DIAGNOSIS — Z6841 Body Mass Index (BMI) 40.0 and over, adult: Secondary | ICD-10-CM

## 2018-12-12 DIAGNOSIS — F3289 Other specified depressive episodes: Secondary | ICD-10-CM

## 2018-12-12 DIAGNOSIS — R5383 Other fatigue: Secondary | ICD-10-CM | POA: Diagnosis not present

## 2018-12-12 MED ORDER — BUPROPION HCL ER (SR) 200 MG PO TB12: 200.0000 mg | ORAL_TABLET | Freq: Every day | ORAL | 0 refills | Status: DC

## 2018-12-12 NOTE — Progress Notes (Signed)
Office: [phone]  /  Fax: [phone]   HPI:   Chief Complaint: OBESITY Sheralyn is here to discuss her progress with her obesity treatment plan. She is on the PC/[place] plan and is following her eating plan approximately 60% of the time. She states she is doing aerobics 30 minutes 1-2 times per week. Britley reports that she is doing well with PC/[place] and finds it less stressful than a structured plan. She has questions about weight loss surgery. Her weight is (!) 305 lb (138.3 kg) today and has had a weight gain of 13 lbs since her last in-office visit 4 months ago. She has lost 0 lbs since starting treatment with [place].  Fatigue Aerianna feels her energy is lower than it should be. She has a family history in her mother of thyroid disease. She reports fatigue daily and is currently on no medication.  Pre-Diabetes Liane has a diagnosis of prediabetes based on her elevated Hgb A1c and was informed this puts her at greater risk of developing diabetes. She is taking metformin currently and continues to work on diet and exercise to decrease risk of diabetes. She denies nausea, vomiting, or diarrhea. No polyphagia.  At risk for diabetes Adrinne is at higher than averagerisk for developing diabetes due to her obesity. She currently denies polyuria or polydipsia.  Hyperlipidemia Kymiah has hyperlipidemia and has been trying to improve her cholesterol levels with intensive lifestyle modification including a low saturated fat diet, exercise and weight loss. She is on no medication and denies any chest pain.   Vitamin D deficiency Yasha has a diagnosis of Vitamin D deficiency. She is currently taking Vit D and denies nausea, vomiting or muscle weakness.  Depression with emotional eating behaviors Gurtha is struggling with emotional eating and using food for comfort to the extent that it is negatively impacting her health. She often snacks when she is not hungry. Orianna sometimes feels she is out of  control and then feels guilty that she made poor food choices. She has been working on behavior modification techniques to help reduce her emotional eating and has been somewhat successful. Paw is on bupropion and shows no sign of suicidal or homicidal ideations. Her blood pressure is normal.  Depression screen [facility] 2/9 08/17/2018 07/29/2018 09/10/2017 03/31/2016  Decreased Interest 0 0 3 0  Down, Depressed, Hopeless 0 0 2 0  PHQ - 2 Score 0 0 5 0  Altered sleeping 0 2 2 -  Tired, decreased energy 0 1 3 -  Change in appetite 0 2 3 -  Feeling bad or failure about yourself  0 0 3 -  Trouble concentrating 0 0 1 -  Moving slowly or fidgety/restless 0 0 1 -  Suicidal thoughts 0 0 0 -  PHQ-9 Score 0 5 18 -  Difficult doing work/chores Not difficult at all - Very difficult -   ASSESSMENT AND PLAN:  Other fatigue - Plan: T3, T4, free, TSH  Prediabetes - Plan: Hemoglobin A1c, Insulin, random, Comprehensive metabolic panel  Other hyperlipidemia - Plan: Lipid Panel With LDL/HDL Ratio  Vitamin D deficiency - Plan: VITAMIN D 25 Hydroxy (Vit-D Deficiency, Fractures)  Other depression - Plan: buPROPion (WELLBUTRIN SR) 200 MG 12 hr tablet  At risk for diabetes mellitus  Class 3 severe obesity with serious comorbidity and body mass index (BMI) of 50.0 to 59.9 in adult, unspecified obesity type (HCC)  Other depression - with emotional eating - Plan: buPROPion (WELLBUTRIN SR) 200 MG 12 hr tablet  PLAN:  Fatigue Irlene was informed that her fatigue may be related to obesity, depression or many other causes. A thyroid panel will be ordered, and in the meanwhile Caylei has agreed to work on diet, exercise and weight loss to help with fatigue. Proper sleep hygiene was discussed including the need for 7-8 hours of quality sleep each night.  Pre-Diabetes Amorita will continue to work on weight loss, exercise, and decreasing simple carbohydrates in her diet to help decrease the risk of diabetes. We  dicussed metformin including benefits and risks. She was informed that eating too many simple carbohydrates or too many calories at one sitting increases the likelihood of GI side effects. Omer will continue with metformin and weight loss. She will have labs checked and follow-up with [place] as directed to monitor her progress.  Diabetes risk counseling Desaree was given extended (15 minutes) diabetes prevention counseling today. She is 38 y.o. female and has risk factors for diabetes including obesity. We discussed intensive lifestyle modifications today with an emphasis on weight loss as well as increasing exercise and decreasing simple carbohydrates in her diet.  Hyperlipidemia Cinthia was informed of the American Heart Association Guidelines emphasizing intensive lifestyle modifications as the first line treatment for hyperlipidemia. We discussed many lifestyle modifications today in depth, and Pernell will continue to work on decreasing saturated fats such as fatty red meat, butter and many fried foods. She will also increase vegetables and lean protein in her diet, have labs checked, and continue to work on exercise and weight loss efforts.  Vitamin D Deficiency Yomara was informed that low Vitamin D levels contributes to fatigue and are associated with obesity, breast, and colon cancer. She agrees to continue taking Vit D and will have routine testing of Vitamin D today. She was informed of the risk of over-replacement of Vitamin D and agrees to not increase her dose unless she discusses this with [place] first. Baylie agrees to follow-up with our clinic in 2 weeks.  Depression with Emotional Eating Behaviors We discussed behavior modification techniques today to help Vella deal with her emotional eating and depression. Hermena was given a refill on her Wellbutrin #30 with 0 refills and agrees to follow-up with our clinic in 2 weeks.  Obesity Lanise is currently in the action stage of change. As such,  her goal is to continue with weight loss efforts. She has agreed to follow the PC/[place] plan. Rucha has been instructed to work up to a goal of 150 minutes of combined cardio and strengthening exercise per week for weight loss and overall health benefits. We discussed the following Behavioral Modification Strategies today: work on meal planning, easy cooking plans, and keeping healthy foods in the home.  Randi has agreed to follow-up with our clinic in 2 weeks. She was informed of the importance of frequent follow-up visits to maximize her success with intensive lifestyle modifications for her multiple health conditions.  ALLERGIES: No Known Allergies  MEDICATIONS: Current Outpatient Medications on File Prior to Visit  Medication Sig Dispense Refill   albuterol (PROAIR HFA) 108 (90 Base) MCG/ACT inhaler Inhale 2 puffs into the lungs every 6 (six) hours as needed for wheezing or shortness of breath. 8.5 Inhaler 1   ALPRAZolam (XANAX) 0.25 MG tablet Take 1 tablet (0.25 mg total) by mouth at bedtime. 20 tablet 0   beclomethasone (QVAR) 40 MCG/ACT inhaler Inhale 2 puffs into the lungs 2 (two) times daily. 1 Inhaler 3   calcium carbonate (TUMS - DOSED IN MG ELEMENTAL  CALCIUM) 500 MG chewable tablet Chew 1 tablet by mouth 3 (three) times daily as needed for indigestion or heartburn.      cefdinir (OMNICEF) 300 MG capsule Take 1 capsule (300 mg total) by mouth 2 (two) times daily. 10 capsule 0   diphenhydrAMINE HCl, Sleep, (ZZZQUIL) 25 MG CAPS Take 25 mg by mouth at bedtime as needed (for sleep.).     fluticasone (FLONASE) 50 MCG/ACT nasal spray Place 2 sprays into both nostrils daily. 16 g 6   ibuprofen (ADVIL,MOTRIN) 600 MG tablet Take 1 tablet (600 mg total) by mouth every 6 (six) hours as needed (mild pain). 30 tablet 0   Insulin Pen Needle (BD PEN NEEDLE NANO 2ND GEN) 32G X 4 MM MISC 1 Package by Does not apply route 2 (two) times daily. 100 each 0   metFORMIN (GLUCOPHAGE) 500 MG tablet  Take 1 tablet (500 mg total) by mouth 2 (two) times daily with a meal. 60 tablet 2   metoprolol succinate (TOPROL-XL) 50 MG 24 hr tablet Take 1 tablet (50 mg total) by mouth daily. Take with or immediately following a meal. 90 tablet 3   Norethindrone Acetate-Ethinyl Estrad-FE (BLISOVI 24 FE) 1-20 MG-MCG(24) tablet Take 1 tablet by mouth daily. 1 Package 11   Polyethyl Glycol-Propyl Glycol (LUBRICANT EYE DROPS) 0.4-0.3 % SOLN Place 1 drop into both eyes 3 (three) times daily as needed (for dry/allergy eyes.).     sertraline (ZOLOFT) 100 MG tablet Take 100 mg by mouth daily.     traZODone (DESYREL) 100 MG tablet Take 100 mg by mouth at bedtime.     triamcinolone cream (KENALOG) 0.1 % Apply 1 application topically 2 (two) times daily as needed (for eczema on hands.). 30 g 3   Vitamin D, Ergocalciferol, (DRISDOL) 1.25 MG (50000 UT) CAPS capsule Take 1 capsule (50,000 Units total) by mouth every 3 (three) days. 10 capsule 0   No current facility-administered medications on file prior to visit.     PAST MEDICAL HISTORY: Past Medical History:  Diagnosis Date   Anxiety    Asthma    Dry eyes    Dry skin    Eczema    Fatigue    Fibroids    H/O echocardiogram    only showed tachycardia   Heartburn    HTN (hypertension)    Hyperlipidemia    Palpitations    Pre-diabetes     pre diabetes, Metformin ordered from weight loss clinic   Rash in adult    Shortness of breath    due to allergies   Stress    Tachycardia    Vitamin D deficiency    Wheezing     PAST SURGICAL HISTORY: Past Surgical History:  Procedure Laterality Date   EYE SURGERY Bilateral 2006   Lasik   MYOMECTOMY N/A 10/30/2017   Procedure: ABDOMINAL MYOMECTOMY;  Surgeon: Lavonia Drafts, MD;  Location: [facility];  Service: Gynecology;  Laterality: N/A;    SOCIAL HISTORY: Social History   Tobacco Use   Smoking status: Never Smoker   Smokeless tobacco: Never Used  Substance Use Topics     Alcohol use: No    Alcohol/week: 0.0 standard drinks   Drug use: No    FAMILY HISTORY: Family History  Problem Relation Age of Onset   Thyroid disease Mother    Fibroids Mother    Deep vein thrombosis Mother    Cancer Maternal Grandmother        liver    Cancer Maternal Grandfather  pancreatic   Hyperlipidemia Father    Hypertension Father    Sleep apnea Father    Obesity Father    Cancer Paternal Grandfather        prostate   ROS: Review of Systems  Constitutional: Positive for malaise/fatigue.  Cardiovascular: Negative for chest pain.  Gastrointestinal: Negative for diarrhea, nausea and vomiting.  Musculoskeletal:       Negative for muscle weakness.  Endo/Heme/Allergies:       Negative for polyphagia.  Psychiatric/Behavioral: Positive for depression (emotional eating). Negative for suicidal ideas.       Negative for homicidal ideas.   PHYSICAL EXAM: Blood pressure 106/73, pulse 79, temperature 97.9 F (36.6 C), temperature source Oral, height 5\' 2"  (1.575 m), weight (!) 305 lb (138.3 kg), SpO2 96 %. Body mass index is 55.79 kg/m. Physical Exam Vitals signs reviewed.  Constitutional:      Appearance: Normal appearance. She is obese.  Cardiovascular:     Rate and Rhythm: Normal rate.     Pulses: Normal pulses.  Pulmonary:     Effort: Pulmonary effort is normal.     Breath sounds: Normal breath sounds.  Musculoskeletal: Normal range of motion.  Skin:    General: Skin is warm and dry.  Neurological:     Mental Status: She is alert and oriented to person, place, and time.  Psychiatric:        Behavior: Behavior normal.   RECENT LABS AND TESTS: BMET    Component Value Date/Time   NA 137 04/25/2018 1318   K 4.3 04/25/2018 1318   CL 101 04/25/2018 1318   CO2 20 04/25/2018 1318   GLUCOSE 89 04/25/2018 1318   GLUCOSE 93 10/17/2017 1548   BUN 10 04/25/2018 1318   CREATININE 0.56 (L) 04/25/2018 1318   CALCIUM 9.3 04/25/2018 1318    GFRNONAA 121 04/25/2018 1318   GFRAA 140 04/25/2018 1318   Lab Results  Component Value Date   HGBA1C 6.0 (H) 04/25/2018   HGBA1C 5.6 01/01/2018   HGBA1C 5.9 (H) 09/10/2017   Lab Results  Component Value Date   INSULIN 19.6 04/25/2018   INSULIN 19.2 01/01/2018   INSULIN 12.3 09/10/2017   CBC    Component Value Date/Time   WBC 7.8 04/25/2018 1318   WBC 13.3 (H) 10/31/2017 0516   RBC 4.80 04/25/2018 1318   RBC 4.27 10/31/2017 0516   HGB 14.3 04/25/2018 1318   HCT 43.0 04/25/2018 1318   PLT 363 04/25/2018 1318   MCV 90 04/25/2018 1318   MCH 29.8 04/25/2018 1318   MCH 31.9 10/31/2017 0516   MCHC 33.3 04/25/2018 1318   MCHC 33.8 10/31/2017 0516   RDW 12.6 04/25/2018 1318   LYMPHSABS 2.3 04/25/2018 1318   MONOABS 0.5 07/30/2017 1631   EOSABS 0.3 04/25/2018 1318   BASOSABS 0.1 04/25/2018 1318   Iron/TIBC/Ferritin/ %Sat No results found for: IRON, TIBC, FERRITIN, IRONPCTSAT Lipid Panel     Component Value Date/Time   CHOL 204 (H) 04/25/2018 1318   TRIG 118 04/25/2018 1318   HDL 48 04/25/2018 1318   CHOLHDL 4.3 04/25/2018 1318   CHOLHDL 4 07/03/2017 1609   VLDL 19.6 07/03/2017 1609   LDLCALC 132 (H) 04/25/2018 1318   Hepatic Function Panel     Component Value Date/Time   PROT 6.5 04/25/2018 1318   ALBUMIN 3.9 04/25/2018 1318   AST 15 04/25/2018 1318   ALT 16 04/25/2018 1318   ALKPHOS 86 04/25/2018 1318   BILITOT 0.3 04/25/2018 1318  Component Value Date/Time   TSH 3.340 09/10/2017 1058   TSH 3.55 07/30/2017 1631   TSH 2.28 08/17/2016 1707   Results for TANISHA, LUTES (MRN [id number]) as of 12/12/2018 14:22  Ref. Range 04/25/2018 13:18  Vitamin D, 25-Hydroxy Latest Ref Range: 30.0 - 100.0 ng/mL 26.9 (L)   OBESITY BEHAVIORAL INTERVENTION VISIT  Today's visit was #23   Starting weight: 297 lbs Starting date: 09/10/2017 Today's weight: 305 lbs  Today's date: 12/12/2018 Total lbs lost to date: 0   12/12/2018  Height 5\' 2"  (1.575 m)  Weight 305 lb  (138.3 kg) (A)  BMI (Calculated) 55.77  BLOOD PRESSURE - SYSTOLIC 680  BLOOD PRESSURE - DIASTOLIC 73   Body Fat % 88.1 %  Total Body Water (lbs) 100.8 lbs   ASK: We discussed the diagnosis of obesity with [name] today and Ludy agreed to give [place] permission to discuss obesity behavioral modification therapy today.  ASSESS: [place] has the diagnosis of obesity and her BMI today is 55.9. Maddyson is in the action stage of change.   ADVISE: Taygan was educated on the multiple health risks of obesity as well as the benefit of weight loss to improve her health. She was advised of the need for long term treatment and the importance of lifestyle modifications to improve her current health and to decrease her risk of future health problems.  AGREE: Multiple dietary modification options and treatment options were discussed and  Mehlani agreed to follow the recommendations documented in the above note.  ARRANGE: Shiya was educated on the importance of frequent visits to treat obesity as outlined per CMS and USPSTF guidelines and agreed to schedule her next follow up appointment today.  Migdalia Dk, am acting as transcriptionist for Abby Potash, PA-C I, Abby Potash, PA-C have reviewed above note and agree with its content

## 2018-12-13 LAB — COMPREHENSIVE METABOLIC PANEL
ALT: 16 IU/L (ref 0–32)
AST: 18 IU/L (ref 0–40)
Albumin/Globulin Ratio: 2 (ref 1.2–2.2)
Albumin: 4.4 g/dL (ref 3.8–4.8)
Alkaline Phosphatase: 95 IU/L (ref 39–117)
BUN/Creatinine Ratio: 20 (ref 9–23)
BUN: 14 mg/dL (ref 6–20)
Bilirubin Total: 0.2 mg/dL (ref 0.0–1.2)
CO2: 17 mmol/L — ABNORMAL LOW (ref 20–29)
Calcium: 8.9 mg/dL (ref 8.7–10.2)
Chloride: 101 mmol/L (ref 96–106)
Creatinine, Ser: 0.7 mg/dL (ref 0.57–1.00)
GFR calc Af Amer: 129 mL/min/{1.73_m2} (ref >59)
GFR calc non Af Amer: 112 mL/min/{1.73_m2} (ref >59)
Globulin, Total: 2.2 g/dL (ref 1.5–4.5)
Glucose: 90 mg/dL (ref 65–99)
Potassium: 4.1 mmol/L (ref 3.5–5.2)
Sodium: 139 mmol/L (ref 134–144)
Total Protein: 6.6 g/dL (ref 6.0–8.5)

## 2018-12-13 LAB — INSULIN, RANDOM: INSULIN: 9.9 u[IU]/mL (ref 2.6–24.9)

## 2018-12-13 LAB — LIPID PANEL WITH LDL/HDL RATIO
Cholesterol, Total: 224 mg/dL — ABNORMAL HIGH (ref 100–199)
HDL: 51 mg/dL (ref >39)
LDL Calculated: 148 mg/dL — ABNORMAL HIGH (ref 0–99)
LDl/HDL Ratio: 2.9 ratio (ref 0.0–3.2)
Triglycerides: 123 mg/dL (ref 0–149)
VLDL Cholesterol Cal: 25 mg/dL (ref 5–40)

## 2018-12-13 LAB — T4, FREE: Free T4: 1.03 ng/dL (ref 0.82–1.77)

## 2018-12-13 LAB — HEMOGLOBIN A1C
Est. average glucose Bld gHb Est-mCnc: 123 mg/dL
Hgb A1c MFr Bld: 5.9 % — ABNORMAL HIGH (ref 4.8–5.6)

## 2018-12-13 LAB — T3: T3, Total: 97 ng/dL (ref 71–180)

## 2018-12-13 LAB — TSH: TSH: 3.57 u[IU]/mL (ref 0.450–4.500)

## 2018-12-13 LAB — VITAMIN D 25 HYDROXY (VIT D DEFICIENCY, FRACTURES): Vit D, 25-Hydroxy: 41.7 ng/mL (ref 30.0–100.0)

## 2018-12-21 ENCOUNTER — Other Ambulatory Visit (INDEPENDENT_AMBULATORY_CARE_PROVIDER_SITE_OTHER): Payer: Self-pay | Admitting: Physician Assistant

## 2018-12-21 DIAGNOSIS — E559 Vitamin D deficiency, unspecified: Secondary | ICD-10-CM

## 2018-12-26 ENCOUNTER — Encounter (INDEPENDENT_AMBULATORY_CARE_PROVIDER_SITE_OTHER): Payer: Self-pay | Admitting: Physician Assistant

## 2018-12-26 ENCOUNTER — Other Ambulatory Visit: Payer: Self-pay

## 2018-12-26 ENCOUNTER — Ambulatory Visit (INDEPENDENT_AMBULATORY_CARE_PROVIDER_SITE_OTHER): Payer: BC Managed Care – PPO | Admitting: Physician Assistant

## 2018-12-26 VITALS — BP 124/86 | HR 95 | Temp 98.3°F | Ht 62.0 in | Wt 306.0 lb

## 2018-12-26 DIAGNOSIS — R7303 Prediabetes: Secondary | ICD-10-CM

## 2018-12-26 DIAGNOSIS — Z9189 Other specified personal risk factors, not elsewhere classified: Secondary | ICD-10-CM | POA: Diagnosis not present

## 2018-12-26 DIAGNOSIS — Z6841 Body Mass Index (BMI) 40.0 and over, adult: Secondary | ICD-10-CM

## 2018-12-26 DIAGNOSIS — E559 Vitamin D deficiency, unspecified: Secondary | ICD-10-CM

## 2018-12-26 MED ORDER — VITAMIN D (ERGOCALCIFEROL) 1.25 MG (50000 UNIT) PO CAPS: 50000.0000 [IU] | ORAL_CAPSULE | ORAL | 0 refills | Status: DC

## 2018-12-26 NOTE — Progress Notes (Signed)
Office: [phone]  /  Fax: [phone]   HPI:   Chief Complaint: OBESITY Jeanne [name] is here to discuss her progress with her obesity treatment plan. She is on the [place] eating plan and is following her eating plan approximately 70% of the time. She states she is doing cardio 30 minutes 3 times per week. Jeanne [name] reports that she is doing better getting all of her protein in. She is going to the beach in 2 weeks.  Her weight is (!) 306 lb (138.8 kg) today and has had a weight gain of 1 lb since her last visit. She has lost 0 lbs since starting treatment with [place].  Vitamin D deficiency Jeanne [name] has a diagnosis of Vitamin D deficiency. She is currently taking prescription Vit D and denies nausea, vomiting or muscle weakness.  Pre-Diabetes Jeanne [name] has a diagnosis of prediabetes based on her elevated Hgb A1c and was informed this puts her at greater risk of developing diabetes. She is taking metformin currently and continues to work on diet and exercise to decrease risk of diabetes. She denies nausea, vomiting, or diarrhea. No polyphagia.  At risk for diabetes Jeanne [name] is at higher than averagerisk for developing diabetes due to her obesity. She currently denies polyuria or polydipsia.  ASSESSMENT AND PLAN:  Vitamin D deficiency - Plan: Vitamin D, Ergocalciferol, (DRISDOL) 1.25 MG (50000 UT) CAPS capsule  Prediabetes  At risk for diabetes mellitus  Class 3 severe obesity with serious comorbidity and body mass index (BMI) of 50.0 to 59.9 in adult, unspecified obesity type (Jeanne [name])  PLAN:  Vitamin D Deficiency Jeanne [name] was informed that low Vitamin D levels contributes to fatigue and are associated with obesity, breast, and colon cancer. She agrees to continue to take prescription Vit D @ 50,000 IU every week #4 with 0 refills and will add OTC 4,000 units 1 day. She will follow-up for routine testing of Vitamin D, at least 2-3 times per year. She was informed of the risk of over-replacement of  Vitamin D and agrees to not increase her dose unless she discusses this with [place] first. Jeanne [name] agrees to follow-up with our clinic in 3 weeks.  Pre-Diabetes Jeanne [name] will continue to work on weight loss, exercise, and decreasing simple carbohydrates in her diet to help decrease the risk of diabetes. We dicussed metformin including benefits and risks. She was informed that eating too many simple carbohydrates or too many calories at one sitting increases the likelihood of GI side effects. Jeanne [name] will continue with medications and weight loss and will follow-up with our clinic in 3 weeks to monitor her progress.  Diabetes risk counseling Jeanne [name] was given extended (15 minutes) diabetes prevention counseling today. She is 38 y.o. female and has risk factors for diabetes including obesity. We discussed intensive lifestyle modifications today with an emphasis on weight loss as well as increasing exercise and decreasing simple carbohydrates in her diet.  Obesity Jeanne [name] is currently in the action stage of change. As such, her goal is to continue with weight loss efforts. She has agreed to portion control better and make smarter food choices, such as increase vegetables and decrease simple carbohydrates. Jeanne [name] has been instructed to work up to a goal of 150 minutes of combined cardio and strengthening exercise per week for weight loss and overall health benefits. We discussed the following Behavioral Modification Strategies today: work on meal planning and easy cooking plans, and keeping healthy foods in the home.  Jeanne [name] has agreed to follow-up with our clinic in 3  weeks. She was informed of the importance of frequent follow-up visits to maximize her success with intensive lifestyle modifications for her multiple health conditions.  ALLERGIES: No Known Allergies  MEDICATIONS: Current Outpatient Medications on File Prior to Visit  Medication Sig Dispense Refill   albuterol (PROAIR HFA) 108 (90 Base)  MCG/ACT inhaler Inhale 2 puffs into the lungs every 6 (six) hours as needed for wheezing or shortness of breath. 8.5 Inhaler 1   ALPRAZolam (XANAX) 0.25 MG tablet Take 1 tablet (0.25 mg total) by mouth at bedtime. 20 tablet 0   beclomethasone (QVAR) 40 MCG/ACT inhaler Inhale 2 puffs into the lungs 2 (two) times daily. 1 Inhaler 3   buPROPion (WELLBUTRIN SR) 200 MG 12 hr tablet Take 1 tablet (200 mg total) by mouth daily. 30 tablet 0   calcium carbonate (TUMS - DOSED IN MG ELEMENTAL CALCIUM) 500 MG chewable tablet Chew 1 tablet by mouth 3 (three) times daily as needed for indigestion or heartburn.      cefdinir (OMNICEF) 300 MG capsule Take 1 capsule (300 mg total) by mouth 2 (two) times daily. 10 capsule 0   diphenhydrAMINE HCl, Sleep, (ZZZQUIL) 25 MG CAPS Take 25 mg by mouth at bedtime as needed (for sleep.).     fluticasone (FLONASE) 50 MCG/ACT nasal spray Place 2 sprays into both nostrils daily. 16 g 6   ibuprofen (ADVIL,MOTRIN) 600 MG tablet Take 1 tablet (600 mg total) by mouth every 6 (six) hours as needed (mild pain). 30 tablet 0   Insulin Pen Needle (BD PEN NEEDLE NANO 2ND GEN) 32G X 4 MM MISC 1 Package by Does not apply route 2 (two) times daily. 100 each 0   metFORMIN (GLUCOPHAGE) 500 MG tablet Take 1 tablet (500 mg total) by mouth 2 (two) times daily with a meal. 60 tablet 2   metoprolol succinate (TOPROL-XL) 50 MG 24 hr tablet Take 1 tablet (50 mg total) by mouth daily. Take with or immediately following a meal. 90 tablet 3   Norethindrone Acetate-Ethinyl Estrad-FE (BLISOVI 24 FE) 1-20 MG-MCG(24) tablet Take 1 tablet by mouth daily. 1 Package 11   Polyethyl Glycol-Propyl Glycol (LUBRICANT EYE DROPS) 0.4-0.3 % SOLN Place 1 drop into both eyes 3 (three) times daily as needed (for dry/allergy eyes.).     sertraline (ZOLOFT) 100 MG tablet Take 100 mg by mouth daily.     traZODone (DESYREL) 100 MG tablet Take 100 mg by mouth at bedtime.     triamcinolone cream (KENALOG) 0.1 %  Apply 1 application topically 2 (two) times daily as needed (for eczema on hands.). 30 g 3   No current facility-administered medications on file prior to visit.     PAST MEDICAL HISTORY: Past Medical History:  Diagnosis Date   Anxiety    Asthma    Dry eyes    Dry skin    Eczema    Fatigue    Fibroids    H/O echocardiogram    only showed tachycardia   Heartburn    HTN (hypertension)    Hyperlipidemia    Palpitations    Pre-diabetes     pre diabetes, Metformin ordered from weight loss clinic   Rash in adult    Shortness of breath    due to allergies   Stress    Tachycardia    Vitamin D deficiency    Wheezing     PAST SURGICAL HISTORY: Past Surgical History:  Procedure Laterality Date   EYE SURGERY Bilateral 2006   Lasik  MYOMECTOMY N/A 10/30/2017   Procedure: ABDOMINAL MYOMECTOMY;  Surgeon: Lavonia Drafts, MD;  Location: [facility];  Service: Gynecology;  Laterality: N/A;    SOCIAL HISTORY: Social History   Tobacco Use   Smoking status: Never Smoker   Smokeless tobacco: Never Used  Substance Use Topics   Alcohol use: No    Alcohol/week: 0.0 standard drinks   Drug use: No    FAMILY HISTORY: Family History  Problem Relation [name] of Onset   Thyroid disease Mother    Fibroids Mother    Deep vein thrombosis Mother    Cancer Maternal Grandmother        liver    Cancer Maternal Grandfather        pancreatic   Hyperlipidemia Father    Hypertension Father    Sleep apnea Father    Obesity Father    Cancer Paternal Grandfather        prostate   ROS: Review of Systems  Gastrointestinal: Negative for diarrhea, nausea and vomiting.  Musculoskeletal:       Negative for muscle weakness.  Endo/Heme/Allergies:       Negative for polyphagia.   PHYSICAL EXAM: Blood pressure 124/86, pulse 95, temperature 98.3 F (36.8 C), temperature source Oral, height 5\' 2"  (1.575 m), weight (!) 306 lb (138.8 kg), SpO2 96 %. Body mass  index is 55.97 kg/m. Physical Exam Vitals signs reviewed.  Constitutional:      Appearance: Normal appearance. She is obese.  Cardiovascular:     Rate and Rhythm: Normal rate.     Pulses: Normal pulses.  Pulmonary:     Effort: Pulmonary effort is normal.     Breath sounds: Normal breath sounds.  Musculoskeletal: Normal range of motion.  Skin:    General: Skin is warm and dry.  Neurological:     Mental Status: She is alert and oriented to person, place, and time.  Psychiatric:        Behavior: Behavior normal.   RECENT LABS AND TESTS: BMET    Component Value Date/Time   NA 139 12/12/2018 0922   K 4.1 12/12/2018 0922   CL 101 12/12/2018 0922   CO2 17 (L) 12/12/2018 0922   GLUCOSE 90 12/12/2018 0922   GLUCOSE 93 10/17/2017 1548   BUN 14 12/12/2018 0922   CREATININE 0.70 12/12/2018 0922   CALCIUM 8.9 12/12/2018 0922   GFRNONAA 112 12/12/2018 0922   GFRAA 129 12/12/2018 0922   Lab Results  Component Value Date   HGBA1C 5.9 (H) 12/12/2018   HGBA1C 6.0 (H) 04/25/2018   HGBA1C 5.6 01/01/2018   HGBA1C 5.9 (H) 09/10/2017   Lab Results  Component Value Date   INSULIN 9.9 12/12/2018   INSULIN 19.6 04/25/2018   INSULIN 19.2 01/01/2018   INSULIN 12.3 09/10/2017   CBC    Component Value Date/Time   WBC 7.8 04/25/2018 1318   WBC 13.3 (H) 10/31/2017 0516   RBC 4.80 04/25/2018 1318   RBC 4.27 10/31/2017 0516   HGB 14.3 04/25/2018 1318   HCT 43.0 04/25/2018 1318   PLT 363 04/25/2018 1318   MCV 90 04/25/2018 1318   MCH 29.8 04/25/2018 1318   MCH 31.9 10/31/2017 0516   MCHC 33.3 04/25/2018 1318   MCHC 33.8 10/31/2017 0516   RDW 12.6 04/25/2018 1318   LYMPHSABS 2.3 04/25/2018 1318   MONOABS 0.5 07/30/2017 1631   EOSABS 0.3 04/25/2018 1318   BASOSABS 0.1 04/25/2018 1318   Iron/TIBC/Ferritin/ %Sat No results found for: IRON, TIBC, FERRITIN, IRONPCTSAT  Lipid Panel     Component Value Date/Time   CHOL 224 (H) 12/12/2018 0922   TRIG 123 12/12/2018 0922   HDL 51  12/12/2018 0922   CHOLHDL 4.3 04/25/2018 1318   CHOLHDL 4 07/03/2017 1609   VLDL 19.6 07/03/2017 1609   LDLCALC 148 (H) 12/12/2018 0922   Hepatic Function Panel     Component Value Date/Time   PROT 6.6 12/12/2018 0922   ALBUMIN 4.4 12/12/2018 0922   AST 18 12/12/2018 0922   ALT 16 12/12/2018 0922   ALKPHOS 95 12/12/2018 0922   BILITOT 0.2 12/12/2018 0922      Component Value Date/Time   TSH 3.570 12/12/2018 0922   TSH 3.340 09/10/2017 1058   TSH 3.55 07/30/2017 1631   Results for YANIA, BOGIE (MRN [id number]) as of 12/26/2018 13:54  Ref. Range 12/12/2018 09:22  Vitamin D, 25-Hydroxy Latest Ref Range: 30.0 - 100.0 ng/mL 41.7   OBESITY BEHAVIORAL INTERVENTION VISIT  Today's visit was #24  Starting weight: 297 lbs Starting date: 09/10/2017 Today's weight: 306 lbs Today's date: 12/26/2018 Total lbs lost to date: 0   12/26/2018  Height 5\' 2"  (1.575 m)  Weight 306 lb (138.8 kg) (A)  BMI (Calculated) 55.95  BLOOD PRESSURE - SYSTOLIC 223  BLOOD PRESSURE - DIASTOLIC 86   Body Fat % 36.1 %   ASK: We discussed the diagnosis of obesity with Jeanne [name] today and Jeanne [name] agreed to give [place] permission to discuss obesity behavioral modification therapy today.  ASSESS: Jeanne [name] has the diagnosis of obesity and her BMI today is 56.1. Jeanne [name] is in the action stage of change.  ADVISE: Jeanne [name] was educated on the multiple health risks of obesity as well as the benefit of weight loss to improve her health. She was advised of the need for long term treatment and the importance of lifestyle modifications to improve her current health and to decrease her risk of future health problems.  AGREE: Multiple dietary modification options and treatment options were discussed and  Jeanne [name] agreed to follow the recommendations documented in the above note.  ARRANGE: Jeanne [name] was educated on the importance of frequent visits to treat obesity as outlined per CMS and USPSTF guidelines and agreed to  schedule her next follow up appointment today.  Jeanne [name] Jeanne [name], am acting as transcriptionist for Abby Potash, PA-C I, Abby Potash, PA-C have reviewed above note and agree with its content

## 2018-12-26 NOTE — Telephone Encounter (Signed)
FYI

## 2019-01-01 ENCOUNTER — Other Ambulatory Visit: Payer: Self-pay | Admitting: Psychiatry

## 2019-01-02 NOTE — Telephone Encounter (Signed)
Need to review paper chart  

## 2019-01-16 ENCOUNTER — Ambulatory Visit (INDEPENDENT_AMBULATORY_CARE_PROVIDER_SITE_OTHER): Payer: BC Managed Care – PPO | Admitting: Physician Assistant

## 2019-01-18 ENCOUNTER — Other Ambulatory Visit (INDEPENDENT_AMBULATORY_CARE_PROVIDER_SITE_OTHER): Payer: Self-pay | Admitting: Physician Assistant

## 2019-01-18 DIAGNOSIS — E559 Vitamin D deficiency, unspecified: Secondary | ICD-10-CM

## 2019-02-01 ENCOUNTER — Other Ambulatory Visit (INDEPENDENT_AMBULATORY_CARE_PROVIDER_SITE_OTHER): Payer: Self-pay | Admitting: Physician Assistant

## 2019-02-01 ENCOUNTER — Encounter (INDEPENDENT_AMBULATORY_CARE_PROVIDER_SITE_OTHER): Payer: Self-pay | Admitting: Physician Assistant

## 2019-02-01 DIAGNOSIS — F3289 Other specified depressive episodes: Secondary | ICD-10-CM

## 2019-02-03 ENCOUNTER — Other Ambulatory Visit (INDEPENDENT_AMBULATORY_CARE_PROVIDER_SITE_OTHER): Payer: Self-pay | Admitting: Physician Assistant

## 2019-02-03 DIAGNOSIS — R7303 Prediabetes: Secondary | ICD-10-CM

## 2019-02-03 NOTE — Telephone Encounter (Signed)
Please advise on refill.

## 2019-02-03 NOTE — Telephone Encounter (Signed)
Ok to send    thanks

## 2019-02-17 ENCOUNTER — Encounter (INDEPENDENT_AMBULATORY_CARE_PROVIDER_SITE_OTHER): Payer: Self-pay | Admitting: Physician Assistant

## 2019-02-17 ENCOUNTER — Other Ambulatory Visit: Payer: Self-pay

## 2019-02-17 DIAGNOSIS — N946 Dysmenorrhea, unspecified: Secondary | ICD-10-CM

## 2019-02-17 NOTE — Telephone Encounter (Signed)
FYI

## 2019-02-19 ENCOUNTER — Ambulatory Visit (INDEPENDENT_AMBULATORY_CARE_PROVIDER_SITE_OTHER): Payer: BC Managed Care – PPO | Admitting: Physician Assistant

## 2019-02-21 ENCOUNTER — Ambulatory Visit
Admission: RE | Admit: 2019-02-21 | Discharge: 2019-02-21 | Disposition: A | Discharge status: Home / Self Care | Payer: BC Managed Care – PPO | Source: Ambulatory Visit | Attending: Obstetrics & Gynecology | Admitting: Obstetrics & Gynecology

## 2019-02-21 DIAGNOSIS — N946 Dysmenorrhea, unspecified: Secondary | ICD-10-CM

## 2019-02-28 ENCOUNTER — Encounter: Payer: Self-pay | Admitting: Family Medicine

## 2019-02-28 DIAGNOSIS — F3289 Other specified depressive episodes: Secondary | ICD-10-CM

## 2019-02-28 DIAGNOSIS — R7303 Prediabetes: Secondary | ICD-10-CM

## 2019-02-28 DIAGNOSIS — E559 Vitamin D deficiency, unspecified: Secondary | ICD-10-CM

## 2019-03-03 ENCOUNTER — Other Ambulatory Visit: Payer: Self-pay | Admitting: Family Medicine

## 2019-03-03 DIAGNOSIS — E559 Vitamin D deficiency, unspecified: Secondary | ICD-10-CM

## 2019-03-03 DIAGNOSIS — F3289 Other specified depressive episodes: Secondary | ICD-10-CM

## 2019-03-03 MED ORDER — VITAMIN D (ERGOCALCIFEROL) 1.25 MG (50000 UNIT) PO CAPS: 50000.0000 [IU] | ORAL_CAPSULE | ORAL | 2 refills | Status: DC

## 2019-03-03 MED ORDER — BUPROPION HCL ER (SR) 200 MG PO TB12: 200.0000 mg | ORAL_TABLET | Freq: Every day | ORAL | 3 refills | Status: DC

## 2019-03-03 MED ORDER — BUPROPION HCL ER (SR) 200 MG PO TB12: 200.0000 mg | ORAL_TABLET | Freq: Every day | ORAL | 2 refills | Status: DC

## 2019-03-03 NOTE — Telephone Encounter (Signed)
refilled 

## 2019-03-25 ENCOUNTER — Other Ambulatory Visit: Payer: Self-pay | Admitting: Family Medicine

## 2019-03-25 DIAGNOSIS — H6692 Otitis media, unspecified, left ear: Secondary | ICD-10-CM

## 2019-03-25 DIAGNOSIS — J302 Other seasonal allergic rhinitis: Secondary | ICD-10-CM

## 2019-03-31 ENCOUNTER — Other Ambulatory Visit: Payer: Self-pay | Admitting: Psychiatry

## 2019-04-18 ENCOUNTER — Encounter: Payer: Self-pay | Admitting: Family Medicine

## 2019-04-21 ENCOUNTER — Ambulatory Visit: Payer: Self-pay | Admitting: *Deleted

## 2019-04-21 ENCOUNTER — Encounter (HOSPITAL_BASED_OUTPATIENT_CLINIC_OR_DEPARTMENT_OTHER): Payer: Self-pay | Admitting: *Deleted

## 2019-04-21 ENCOUNTER — Emergency Department (HOSPITAL_BASED_OUTPATIENT_CLINIC_OR_DEPARTMENT_OTHER): Payer: BC Managed Care – PPO

## 2019-04-21 ENCOUNTER — Emergency Department (HOSPITAL_BASED_OUTPATIENT_CLINIC_OR_DEPARTMENT_OTHER)
Admission: EM | Admit: 2019-04-21 | Discharge: 2019-04-21 | Disposition: A | Discharge status: Home / Self Care | Payer: BC Managed Care – PPO | Attending: Emergency Medicine | Admitting: Emergency Medicine

## 2019-04-21 ENCOUNTER — Other Ambulatory Visit: Payer: Self-pay

## 2019-04-21 DIAGNOSIS — J45909 Unspecified asthma, uncomplicated: Secondary | ICD-10-CM | POA: Insufficient documentation

## 2019-04-21 DIAGNOSIS — R7303 Prediabetes: Secondary | ICD-10-CM | POA: Insufficient documentation

## 2019-04-21 DIAGNOSIS — R002 Palpitations: Secondary | ICD-10-CM | POA: Diagnosis present

## 2019-04-21 DIAGNOSIS — E785 Hyperlipidemia, unspecified: Secondary | ICD-10-CM | POA: Insufficient documentation

## 2019-04-21 DIAGNOSIS — I10 Essential (primary) hypertension: Secondary | ICD-10-CM | POA: Diagnosis not present

## 2019-04-21 LAB — COMPREHENSIVE METABOLIC PANEL
ALT: 33 U/L (ref 0–44)
AST: 27 U/L (ref 15–41)
Albumin: 4.2 g/dL (ref 3.5–5.0)
Alkaline Phosphatase: 90 U/L (ref 38–126)
Anion gap: 12 (ref 5–15)
BUN: 16 mg/dL (ref 6–20)
CO2: 24 mmol/L (ref 22–32)
Calcium: 9.4 mg/dL (ref 8.9–10.3)
Chloride: 101 mmol/L (ref 98–111)
Creatinine, Ser: 0.75 mg/dL (ref 0.44–1.00)
GFR calc Af Amer: >60 mL/min (ref >60)
GFR calc non Af Amer: >60 mL/min (ref >60)
Glucose, Bld: 107 mg/dL — ABNORMAL HIGH (ref 70–99)
Potassium: 3.6 mmol/L (ref 3.5–5.1)
Sodium: 137 mmol/L (ref 135–145)
Total Bilirubin: 0.5 mg/dL (ref 0.3–1.2)
Total Protein: 7.7 g/dL (ref 6.5–8.1)

## 2019-04-21 LAB — CBC
HCT: 45.3 % (ref 36.0–46.0)
Hemoglobin: 15.2 g/dL — ABNORMAL HIGH (ref 12.0–15.0)
MCH: 31.9 pg (ref 26.0–34.0)
MCHC: 33.6 g/dL (ref 30.0–36.0)
MCV: 95 fL (ref 80.0–100.0)
Platelets: 345 10*3/uL (ref 150–400)
RBC: 4.77 MIL/uL (ref 3.87–5.11)
RDW: 12.6 % (ref 11.5–15.5)
WBC: 10.4 10*3/uL (ref 4.0–10.5)
nRBC: 0 % (ref 0.0–0.2)

## 2019-04-21 NOTE — Telephone Encounter (Signed)
Patient is calling to report she is having palpitations that she is concerned about. Advised UC for evaluation.  Reason for Disposition . History of heart disease  (i.e., heart attack, bypass surgery, angina, angioplasty, CHF) (Exception: brief heart beat symptoms that went away and now feels well)  Answer Assessment - Initial Assessment Questions 1. DESCRIPTION: "Please describe your heart rate or heart beat that you are having" (e.g., fast/slow, regular/irregular, skipped or extra beats, "palpitations")     Feels like pulse is fast and she also feels fluttering 2. ONSET: "When did it start?" (Minutes, hours or days)      1 week- 2 weeks 3. DURATION: "How long does it last" (e.g., seconds, minutes, hours)     Few seconds at a time 4. PATTERN "Does it come and go, or has it been constant since it started?"  "Does it get worse with exertion?"   "Are you feeling it now?"     Comes and goes- frequent, worse with exertion, not feeling now 5. TAP: "Using your hand, can you tap out what you are feeling on a chair or table in front of you, so that I can hear?" (Note: not all patients can do this)       n/a 6. HEART RATE: "Can you tell me your heart rate?" "How many beats in 15 seconds?"  (Note: not all patients can do this)       Yesterday- 90 7. RECURRENT SYMPTOM: "Have you ever had this before?" If so, ask: "When was the last time?" and "What happened that time?"      Yes- cardiac monitor tachycardia- started on medication  8. CAUSE: "What do you think is causing the palpitations?"     unknown 9. CARDIAC HISTORY: "Do you have any history of heart disease?" (e.g., heart attack, angina, bypass surgery, angioplasty, arrhythmia)      History tachycardia 10. OTHER SYMPTOMS: "Do you have any other symptoms?" (e.g., dizziness, chest pain, sweating, difficulty breathing)       Headache- daily, clenching jaw 11. PREGNANCY: "Is there any chance you are pregnant?" "When was your last menstrual period?"     No- LMP- 11/1  Protocols used: HEART RATE AND HEARTBEAT QUESTIONS-A-AH

## 2019-04-21 NOTE — Discharge Instructions (Addendum)
You were evaluated in the Emergency Department and after careful evaluation, we did not find any emergent condition requiring admission or further testing in the hospital.  Your exam/testing today is overall reassuring.  Your EKG, chest x-ray, and labs were normal.  As discussed, please follow-up with your regular prescriber to discuss a possible change to your metoprolol dosing.  Please return to the Emergency Department if you experience any worsening of your condition.  We encourage you to follow up with a primary care provider.  Thank you for allowing [place] to be a part of your care.

## 2019-04-21 NOTE — ED Triage Notes (Signed)
Palpitations on and off x 2 weeks.

## 2019-04-21 NOTE — ED Provider Notes (Signed)
[facility] Emergency Department Provider Note MRN:  [id number]  Arrival date & time: 04/21/19     Chief Complaint   Palpitations   History of Present Illness   Jeanne [name] is a 38 y.o. year-old female with a history of hypertension presenting to the ED with chief complaint of palpitations.  Patient explains that she has been having increasing palpitations for the past week.  Frequency increasing over the past 2 days, occurring multiple times an hour.  She has history of palpitations managed with metoprolol, has been on the same dose for about 3 years.  She is endorsing some increased stress and lack of sleep over the past 2 weeks related to work.  Denies headache, no vision change, no fever, no cough, no chest pain, shortness of breath, no abdominal pain, no numbness or weakness to the arms or legs.  Denies drug use, no excessive caffeine use.  No leg pain or swelling, no history of VTE, no recent travel, no birth control pills.  Review of Systems  A complete 10 system review of systems was obtained and all systems are negative except as noted in the HPI and PMH.   Patient's Health History    Past Medical History:  Diagnosis Date  . Anxiety   . Asthma   . Dry eyes   . Dry skin   . Eczema   . Fatigue   . Fibroids   . H/O echocardiogram    only showed tachycardia  . Heartburn   . HTN (hypertension)   . Hyperlipidemia   . Palpitations   . Pre-diabetes     pre diabetes, Metformin ordered from weight loss clinic  . Rash in adult   . Shortness of breath    due to allergies  . Stress   . Tachycardia   . Vitamin D deficiency   . Wheezing     Past Surgical History:  Procedure Laterality Date  . EYE SURGERY Bilateral 2006   Lasik  . MYOMECTOMY N/A 10/30/2017   Procedure: ABDOMINAL MYOMECTOMY;  Surgeon: Lavonia Drafts, MD;  Location: [facility];  Service: Gynecology;  Laterality: N/A;    Family History  Problem Relation Age of Onset  .  Thyroid disease Mother   . Fibroids Mother   . Deep vein thrombosis Mother   . Cancer Maternal Grandmother        liver   . Cancer Maternal Grandfather        pancreatic  . Hyperlipidemia Father   . Hypertension Father   . Sleep apnea Father   . Obesity Father   . Cancer Paternal Grandfather        prostate    Social History   Socioeconomic History  . Marital status: Married    Spouse name: [name]  . Number of children: Not on file  . Years of education: Not on file  . Highest education level: Not on file  Occupational History  . Occupation: Product manager: [employer]    Comment: 4 th grade  Social Needs  . Financial resource strain: Not on file  . Food insecurity    Worry: Not on file    Inability: Not on file  . Transportation needs    Medical: Not on file    Non-medical: Not on file  Tobacco Use  . Smoking status: Never Smoker  . Smokeless tobacco: Never Used  Substance and Sexual Activity  . Alcohol use: No    Alcohol/week: 0.0  standard drinks  . Drug use: No  . Sexual activity: Yes    Partners: Male    Birth control/protection: Pill  Lifestyle  . Physical activity    Days per week: Not on file    Minutes per session: Not on file  . Stress: Not on file  Relationships  . Social Herbalist on phone: Not on file    Gets together: Not on file    Attends religious service: Not on file    Active member of club or organization: Not on file    Attends meetings of clubs or organizations: Not on file    Relationship status: Not on file  . Intimate partner violence    Fear of current or ex partner: Not on file    Emotionally abused: Not on file    Physically abused: Not on file    Forced sexual activity: Not on file  Other Topics Concern  . Not on file  Social History Narrative   Exercise--- walk in summer--- its been difficult during school year     Physical Exam  Vital Signs and Nursing Notes reviewed Vitals:   04/21/19  1708  BP: (!) 138/100  Pulse: (!) 102  Resp: 20  Temp: 98.6 F (37 C)  SpO2: 97%    CONSTITUTIONAL: Well-appearing, NAD NEURO:  Alert and oriented x 3, no focal deficits EYES:  eyes equal and reactive ENT/NECK:  no LAD, no JVD CARDIO: Regular rate, well-perfused, normal S1 and S2 PULM:  CTAB no wheezing or rhonchi GI/GU:  normal bowel sounds, non-distended, non-tender MSK/SPINE:  No gross deformities, no edema SKIN:  no rash, atraumatic PSYCH:  Appropriate speech and behavior  Diagnostic and Interventional Summary    EKG Interpretation  Date/Time:  Monday April 21 2019 17:10:20 EST Ventricular Rate:  105 PR Interval:  152 QRS Duration: 96 QT Interval:  318 QTC Calculation: 420 R Axis:   97 Text Interpretation: Sinus tachycardia Rightward axis Cannot rule out Anterior infarct , age undetermined Abnormal ECG Confirmed by Gerlene Fee [phone]) on 04/21/2019 5:24:19 PM      Labs Reviewed  CBC - Abnormal; Notable for the following components:      Result Value   Hemoglobin 15.2 (*)    All other components within normal limits  COMPREHENSIVE METABOLIC PANEL - Abnormal; Notable for the following components:   Glucose, Bld 107 (*)    All other components within normal limits    XR Chest 2 View  Final Result      Medications - No data to display   Procedures  /  Critical Care Procedures  ED Course and Medical Decision Making  I have reviewed the triage vital signs and the nursing notes.  Pertinent labs & imaging results that were available during my care of the patient were reviewed by me and considered in my medical decision making (see below for details).     Patient has a history of palpitations managed with metoprolol, I suspect that her dose simply needs adjusting.  Increased palpitation may be related to dehydration, stress, lack of sleep.  EKG is without arrhythmia, will screen labs to rule out metabolic disarray, anticipating discharge.  She has good  follow-up this week with her regular metoprolol prescriber.  Work-up unremarkable, appropriate for discharge.  Barth Kirks. Sedonia Small, [place] [email or web address]  Final Clinical Impressions(s) / ED Diagnoses     ICD-10-CM   1. Palpitation  R00.2 XR  Chest 2 View    XR Chest 2 View    ED Discharge Orders    None       Discharge Instructions Discussed with and Provided to Patient:     Discharge Instructions     You were evaluated in the Emergency Department and after careful evaluation, we did not find any emergent condition requiring admission or further testing in the hospital.  Your exam/testing today is overall reassuring.  Your EKG, chest x-ray, and labs were normal.  As discussed, please follow-up with your regular prescriber to discuss a possible change to your metoprolol dosing.  Please return to the Emergency Department if you experience any worsening of your condition.  We encourage you to follow up with a primary care provider.  Thank you for allowing [place] to be a part of your care.       Maudie Flakes, MD 04/21/19 [phone]

## 2019-04-24 ENCOUNTER — Encounter: Payer: Self-pay | Admitting: Family Medicine

## 2019-04-24 ENCOUNTER — Other Ambulatory Visit: Payer: Self-pay

## 2019-04-24 ENCOUNTER — Ambulatory Visit: Payer: BC Managed Care – PPO | Admitting: Family Medicine

## 2019-04-24 VITALS — BP 136/95 | HR 98 | Temp 96.2°F | Resp 16 | Ht 62.0 in | Wt 326.0 lb

## 2019-04-24 DIAGNOSIS — F419 Anxiety disorder, unspecified: Secondary | ICD-10-CM | POA: Diagnosis not present

## 2019-04-24 DIAGNOSIS — G4700 Insomnia, unspecified: Secondary | ICD-10-CM

## 2019-04-24 DIAGNOSIS — R000 Tachycardia, unspecified: Secondary | ICD-10-CM

## 2019-04-24 DIAGNOSIS — R002 Palpitations: Secondary | ICD-10-CM | POA: Diagnosis not present

## 2019-04-24 LAB — CBC WITH DIFFERENTIAL/PLATELET
Basophils Absolute: 0.1 10*3/uL (ref 0.0–0.1)
Basophils Relative: 0.8 % (ref 0.0–3.0)
Eosinophils Absolute: 0.5 10*3/uL (ref 0.0–0.7)
Eosinophils Relative: 5.7 % — ABNORMAL HIGH (ref 0.0–5.0)
HCT: 42.5 % (ref 36.0–46.0)
Hemoglobin: 14.4 g/dL (ref 12.0–15.0)
Lymphocytes Relative: 29.2 % (ref 12.0–46.0)
Lymphs Abs: 2.3 10*3/uL (ref 0.7–4.0)
MCHC: 33.8 g/dL (ref 30.0–36.0)
MCV: 94.9 fl (ref 78.0–100.0)
Monocytes Absolute: 0.5 10*3/uL (ref 0.1–1.0)
Monocytes Relative: 6.6 % (ref 3.0–12.0)
Neutro Abs: 4.6 10*3/uL (ref 1.4–7.7)
Neutrophils Relative %: 57.7 % (ref 43.0–77.0)
Platelets: 332 10*3/uL (ref 150.0–400.0)
RBC: 4.48 Mil/uL (ref 3.87–5.11)
RDW: 13.5 % (ref 11.5–15.5)
WBC: 8 10*3/uL (ref 4.0–10.5)

## 2019-04-24 LAB — COMPREHENSIVE METABOLIC PANEL
ALT: 28 U/L (ref 0–35)
AST: 19 U/L (ref 0–37)
Albumin: 4.3 g/dL (ref 3.5–5.2)
Alkaline Phosphatase: 87 U/L (ref 39–117)
BUN: 15 mg/dL (ref 6–23)
CO2: 27 mEq/L (ref 19–32)
Calcium: 9.4 mg/dL (ref 8.4–10.5)
Chloride: 103 mEq/L (ref 96–112)
Creatinine, Ser: 0.62 mg/dL (ref 0.40–1.20)
GFR: 108.66 mL/min (ref >60.00)
Glucose, Bld: 131 mg/dL — ABNORMAL HIGH (ref 70–99)
Potassium: 4.2 mEq/L (ref 3.5–5.1)
Sodium: 138 mEq/L (ref 135–145)
Total Bilirubin: 0.3 mg/dL (ref 0.2–1.2)
Total Protein: 6.9 g/dL (ref 6.0–8.3)

## 2019-04-24 LAB — POC URINALSYSI DIPSTICK (AUTOMATED)
Bilirubin, UA: NEGATIVE
Blood, UA: NEGATIVE
Glucose, UA: POSITIVE — AB
Ketones, UA: NEGATIVE
Leukocytes, UA: NEGATIVE
Nitrite, UA: NEGATIVE
Protein, UA: NEGATIVE
Spec Grav, UA: 1.015 (ref 1.010–1.025)
Urobilinogen, UA: 0.2 E.U./dL
pH, UA: 5.5 (ref 5.0–8.0)

## 2019-04-24 LAB — POCT URINE PREGNANCY: Preg Test, Ur: NEGATIVE

## 2019-04-24 LAB — TSH: TSH: 2.26 u[IU]/mL (ref 0.35–4.50)

## 2019-04-24 LAB — VITAMIN B12: Vitamin B-12: 476 pg/mL (ref 211–911)

## 2019-04-24 MED ORDER — METOPROLOL SUCCINATE ER 100 MG PO TB24: 100.0000 mg | ORAL_TABLET | Freq: Every day | ORAL | 3 refills | Status: DC

## 2019-04-24 MED ORDER — ALPRAZOLAM 0.25 MG PO TABS: 0.2500 mg | ORAL_TABLET | Freq: Every day | ORAL | 0 refills | Status: DC

## 2019-04-24 NOTE — Assessment & Plan Note (Signed)
Inc toprol to 100 mg Refer to cardiology May be due to stress/ anxiety

## 2019-04-24 NOTE — Progress Notes (Signed)
Patient ID: Jeanne [name], female    DOB: [birth year]  Age: 38 y.o. MRN: [id number]    Subjective:  Subjective  HPI Jeanne [name] presents for palpitations and stress x few weeks ago She is taking the toprol.  And went to er --- nothing abn was found.  Er visit , ekg and labs were reviewed.   Pt had echo and event monitor a few years ago for the same symptoms  Review of Systems  Constitutional: Negative for appetite change, diaphoresis, fatigue and unexpected weight change.  Eyes: Negative for pain, redness and visual disturbance.  Respiratory: Negative for cough, chest tightness, shortness of breath and wheezing.   Cardiovascular: Positive for palpitations. Negative for chest pain and leg swelling.  Endocrine: Negative for cold intolerance, heat intolerance, polydipsia, polyphagia and polyuria.  Genitourinary: Negative for difficulty urinating, dysuria and frequency.  Neurological: Negative for dizziness, light-headedness, numbness and headaches.    History Past Medical History:  Diagnosis Date   Anxiety    Asthma    Dry eyes    Dry skin    Eczema    Fatigue    Fibroids    H/O echocardiogram    only showed tachycardia   Heartburn    HTN (hypertension)    Hyperlipidemia    Palpitations    Pre-diabetes     pre diabetes, Metformin ordered from weight loss clinic   Rash in adult    Shortness of breath    due to allergies   Stress    Tachycardia    Vitamin D deficiency    Wheezing     She has a past surgical history that includes Eye surgery (Bilateral, 2006) and Myomectomy (N/A, 10/30/2017).   Her family history includes Cancer in her maternal grandfather, maternal grandmother, and paternal grandfather; Deep vein thrombosis in her mother; Fibroids in her mother; Hyperlipidemia in her father; Hypertension in her father; Obesity in her father; Sleep apnea in her father; Thyroid disease in her mother.She reports that she has never smoked. She has never  used smokeless tobacco. She reports that she does not drink alcohol or use drugs.  Current Outpatient Medications on File Prior to Visit  Medication Sig Dispense Refill   albuterol (PROAIR HFA) 108 (90 Base) MCG/ACT inhaler Inhale 2 puffs into the lungs every 6 (six) hours as needed for wheezing or shortness of breath. 8.5 Inhaler 1   beclomethasone (QVAR) 40 MCG/ACT inhaler Inhale 2 puffs into the lungs 2 (two) times daily. 1 Inhaler 3   buPROPion (WELLBUTRIN SR) 200 MG 12 hr tablet Take 1 tablet (200 mg total) by mouth daily. 90 tablet 3   calcium carbonate (TUMS - DOSED IN MG ELEMENTAL CALCIUM) 500 MG chewable tablet Chew 1 tablet by mouth 3 (three) times daily as needed for indigestion or heartburn.      fluticasone (FLONASE) 50 MCG/ACT nasal spray SPRAY 2 SPRAYS INTO EACH NOSTRIL EVERY DAY 48 mL 0   ibuprofen (ADVIL,MOTRIN) 600 MG tablet Take 1 tablet (600 mg total) by mouth every 6 (six) hours as needed (mild pain). 30 tablet 0   metFORMIN (GLUCOPHAGE) 500 MG tablet TAKE 1 TABLET BY MOUTH TWICE A DAY WITH A MEAL 180 tablet 0   Polyethyl Glycol-Propyl Glycol (LUBRICANT EYE DROPS) 0.4-0.3 % SOLN Place 1 drop into both eyes 3 (three) times daily as needed (for dry/allergy eyes.).     sertraline (ZOLOFT) 100 MG tablet TAKE 1 TABLET EVERY DAY 90 tablet 0   triamcinolone cream (KENALOG) 0.1 % Apply  1 application topically 2 (two) times daily as needed (for eczema on hands.). 30 g 3   Vitamin D, Ergocalciferol, (DRISDOL) 1.25 MG (50000 UT) CAPS capsule Take 1 capsule (50,000 Units total) by mouth every 3 (three) days. 10 capsule 2   No current facility-administered medications on file prior to visit.      Objective:  Objective  Physical Exam Vitals signs and nursing note reviewed.  Constitutional:      Appearance: She is well-developed.  HENT:     Head: Normocephalic and atraumatic.  Eyes:     Conjunctiva/sclera: Conjunctivae normal.  Neck:     Musculoskeletal: Normal range of  motion and neck supple.     Thyroid: No thyromegaly.     Vascular: No carotid bruit or JVD.  Cardiovascular:     Rate and Rhythm: Normal rate and regular rhythm.     Heart sounds: Normal heart sounds. No murmur.  Pulmonary:     Effort: Pulmonary effort is normal. No respiratory distress.     Breath sounds: Normal breath sounds. No wheezing or rales.  Chest:     Chest wall: No tenderness.  Neurological:     Mental Status: She is alert and oriented to person, place, and time.  Psychiatric:        Mood and Affect: Mood is anxious. Mood is not depressed.    BP (!) 136/95 (BP Location: Left Arm, Patient Position: Sitting, Cuff Size: Large)    Pulse 98    Temp (!) 96.2 F (35.7 C) (Temporal)    Resp 16    Ht 5\' 2"  (1.575 m)    Wt (!) 326 lb (147.9 kg)    LMP 04/10/2019    SpO2 98%    BMI 59.63 kg/m  Wt Readings from Last 3 Encounters:  04/24/19 (!) 326 lb (147.9 kg)  04/21/19 (!) 306 lb (138.8 kg)  12/26/18 (!) 306 lb (138.8 kg)     Lab Results  Component Value Date   WBC 10.4 04/21/2019   HGB 15.2 (H) 04/21/2019   HCT 45.3 04/21/2019   PLT 345 04/21/2019   GLUCOSE 107 (H) 04/21/2019   CHOL 224 (H) 12/12/2018   TRIG 123 12/12/2018   HDL 51 12/12/2018   LDLCALC 148 (H) 12/12/2018   ALT 33 04/21/2019   AST 27 04/21/2019   NA 137 04/21/2019   K 3.6 04/21/2019   CL 101 04/21/2019   CREATININE 0.75 04/21/2019   BUN 16 04/21/2019   CO2 24 04/21/2019   TSH 3.570 12/12/2018   HGBA1C 5.9 (H) 12/12/2018    Xr Chest 2 View  Result Date: 04/21/2019 CLINICAL DATA:  Palpitations, wheezing, asthma EXAM: CHEST - 2 VIEW COMPARISON:  None. FINDINGS: Lungs are clear.  No pleural effusion or pneumothorax. The heart is normal in size. Visualized osseous structures are within normal limits. IMPRESSION: Normal chest radiographs. Electronically Signed   By: Julian Hy M.D.   On: 04/21/2019 17:42     Assessment & Plan:  Plan  I have discontinued Jeanne [name]'s diphenhydrAMINE HCl  (Sleep), Insulin Pen Needle, metoprolol succinate, traZODone, cefdinir, and Norethindrone Acetate-Ethinyl Estrad-FE. I am also having her start on metoprolol succinate. Additionally, I am having her maintain her calcium carbonate, Polyethyl Glycol-Propyl Glycol, ibuprofen, albuterol, beclomethasone, triamcinolone cream, sertraline, metFORMIN, Vitamin D (Ergocalciferol), buPROPion, fluticasone, and ALPRAZolam.  Meds ordered this encounter  Medications   ALPRAZolam (XANAX) 0.25 MG tablet    Sig: Take 1 tablet (0.25 mg total) by mouth at bedtime.  Dispense:  20 tablet    Refill:  0   metoprolol succinate (TOPROL-XL) 100 MG 24 hr tablet    Sig: Take 1 tablet (100 mg total) by mouth daily. Take with or immediately following a meal.    Dispense:  90 tablet    Refill:  3    Problem List Items Addressed This Visit      Unprioritized   Anxiety    F/u psych May be cause of palpitations Use xanax prn      Relevant Medications   ALPRAZolam (XANAX) 0.25 MG tablet   Insomnia   Relevant Medications   ALPRAZolam (XANAX) 0.25 MG tablet   Sinus tachycardia    Inc toprol to 100 mg Refer to cardiology May be due to stress/ anxiety       Other Visit Diagnoses    Palpitations    -  Primary   Relevant Medications   metoprolol succinate (TOPROL-XL) 100 MG 24 hr tablet   Other Relevant Orders   CBC with Differential   Comprehensive metabolic panel   TSH   Vitamin B12   POCT urine pregnancy (Completed)   Urinalysis Dipstick (Automated) (Completed)   Ambulatory referral to Cardiology      Follow-up: Return in about 3 weeks (around 05/15/2019), or if symptoms worsen or fail to improve.  Rosalita Chessman Chase, DO                                      + ++++

## 2019-04-24 NOTE — Patient Instructions (Signed)

## 2019-04-24 NOTE — Assessment & Plan Note (Addendum)
F/u psych May be cause of palpitations Use xanax prn

## 2019-04-29 ENCOUNTER — Other Ambulatory Visit: Payer: Self-pay | Admitting: Family Medicine

## 2019-04-29 DIAGNOSIS — R739 Hyperglycemia, unspecified: Secondary | ICD-10-CM

## 2019-05-03 ENCOUNTER — Other Ambulatory Visit: Payer: Self-pay | Admitting: Psychiatry

## 2019-05-12 ENCOUNTER — Ambulatory Visit (INDEPENDENT_AMBULATORY_CARE_PROVIDER_SITE_OTHER): Payer: BC Managed Care – PPO | Admitting: Cardiology

## 2019-05-12 ENCOUNTER — Encounter: Payer: Self-pay | Admitting: Cardiology

## 2019-05-12 ENCOUNTER — Other Ambulatory Visit: Payer: Self-pay

## 2019-05-12 VITALS — BP 124/88 | HR 99 | Ht 62.0 in | Wt 330.1 lb

## 2019-05-12 DIAGNOSIS — Z6841 Body Mass Index (BMI) 40.0 and over, adult: Secondary | ICD-10-CM

## 2019-05-12 DIAGNOSIS — I10 Essential (primary) hypertension: Secondary | ICD-10-CM

## 2019-05-12 DIAGNOSIS — R002 Palpitations: Secondary | ICD-10-CM | POA: Diagnosis not present

## 2019-05-12 DIAGNOSIS — E782 Mixed hyperlipidemia: Secondary | ICD-10-CM | POA: Diagnosis not present

## 2019-05-12 NOTE — Progress Notes (Signed)
Cardiology Office Note:    Date:  05/12/2019   ID:  Jeanne [name], DOB 11-17-1980, MRN [id number]  PCP:  Carollee Herter, Alferd Apa, DO  Cardiologist:  Berniece Salines, DO  Electrophysiologist:  None   Referring MD: Carollee Herter, Alferd Apa, *   The patient was referred for palpitations.  History of Present Illness:    Jeanne [name] is a 38 y.o. female with a hx of hypertension, obesity, anxiety presented to be evaluated for heart palliations. The patient was seen in the the [facility] ED for heart palpitations on 04/21/2019. After her visit at the ED she did see her pcp who increased her dose of metoprolol succinate to 100 mg daily. She tells me today that since the increase in her medication she has not experienced any palpitations.   She offers no complaints today.    Past Medical History:  Diagnosis Date  . Anxiety   . Asthma   . Dry eyes   . Dry skin   . Eczema   . Fatigue   . Fibroids   . H/O echocardiogram    only showed tachycardia  . Heartburn   . HTN (hypertension)   . Hyperlipidemia   . Palpitations   . Pre-diabetes     pre diabetes, Metformin ordered from weight loss clinic  . Rash in adult   . Shortness of breath    due to allergies  . Stress   . Tachycardia   . Vitamin D deficiency   . Wheezing     Past Surgical History:  Procedure Laterality Date  . EYE SURGERY Bilateral 2006   Lasik  . MYOMECTOMY N/A 10/30/2017   Procedure: ABDOMINAL MYOMECTOMY;  Surgeon: Lavonia Drafts, MD;  Location: [facility];  Service: Gynecology;  Laterality: N/A;    Current Medications: Current Meds  Medication Sig  . albuterol (PROAIR HFA) 108 (90 Base) MCG/ACT inhaler Inhale 2 puffs into the lungs every 6 (six) hours as needed for wheezing or shortness of breath.  . ALPRAZolam (XANAX) 0.25 MG tablet Take 1 tablet (0.25 mg total) by mouth at bedtime.  . beclomethasone (QVAR) 40 MCG/ACT inhaler Inhale 2 puffs into the lungs 2 (two) times daily.  [name] buPROPion  (WELLBUTRIN SR) 200 MG 12 hr tablet Take 1 tablet (200 mg total) by mouth daily.  . calcium carbonate (TUMS - DOSED IN MG ELEMENTAL CALCIUM) 500 MG chewable tablet Chew 1 tablet by mouth 3 (three) times daily as needed for indigestion or heartburn.   . fluticasone (FLONASE) 50 MCG/ACT nasal spray SPRAY 2 SPRAYS INTO EACH NOSTRIL EVERY DAY  . ibuprofen (ADVIL,MOTRIN) 600 MG tablet Take 1 tablet (600 mg total) by mouth every 6 (six) hours as needed (mild pain).  . metFORMIN (GLUCOPHAGE) 500 MG tablet TAKE 1 TABLET BY MOUTH TWICE A DAY WITH A MEAL (Patient taking differently: 500 mg daily with breakfast. )  . metoprolol succinate (TOPROL-XL) 100 MG 24 hr tablet Take 1 tablet (100 mg total) by mouth daily. Take with or immediately following a meal.  . Polyethyl Glycol-Propyl Glycol (LUBRICANT EYE DROPS) 0.4-0.3 % SOLN Place 1 drop into both eyes 3 (three) times daily as needed (for dry/allergy eyes.).  [name] sertraline (ZOLOFT) 100 MG tablet TAKE 1 TABLET EVERY DAY  . triamcinolone cream (KENALOG) 0.1 % Apply 1 application topically 2 (two) times daily as needed (for eczema on hands.).     Allergies:   Patient has no known allergies.   Social History   Socioeconomic History  .  Marital status: Married    Spouse name: [name]  . Number of children: Not on file  . Years of education: Not on file  . Highest education level: Not on file  Occupational History  . Occupation: Product manager: [employer]    Comment: 4 th grade  Social Needs  . Financial resource strain: Not on file  . Food insecurity    Worry: Not on file    Inability: Not on file  . Transportation needs    Medical: Not on file    Non-medical: Not on file  Tobacco Use  . Smoking status: Never Smoker  . Smokeless tobacco: Never Used  Substance and Sexual Activity  . Alcohol use: No    Alcohol/week: 0.0 standard drinks  . Drug use: No  . Sexual activity: Yes    Partners: Male    Birth control/protection: Pill   Lifestyle  . Physical activity    Days per week: Not on file    Minutes per session: Not on file  . Stress: Not on file  Relationships  . Social Herbalist on phone: Not on file    Gets together: Not on file    Attends religious service: Not on file    Active member of club or organization: Not on file    Attends meetings of clubs or organizations: Not on file    Relationship status: Not on file  Other Topics Concern  . Not on file  Social History Narrative   Exercise--- walk in summer--- its been difficult during school year     Family History: The patient's family history includes Cancer in her maternal grandfather, maternal grandmother, and paternal grandfather; Deep vein thrombosis in her mother; Fibroids in her mother; Hyperlipidemia in her father; Hypertension in her father; Obesity in her father; Sleep apnea in her father; Thyroid disease in her mother.  ROS:   Review of Systems  Constitution: Negative for decreased appetite, fever and weight gain.  HENT: Negative for congestion, ear discharge, hoarse voice and sore throat.   Eyes: Negative for discharge, redness, vision loss in right eye and visual halos.  Cardiovascular: Negative for chest pain, dyspnea on exertion, leg swelling, orthopnea and palpitations.  Respiratory: Negative for cough, hemoptysis, shortness of breath and snoring.   Endocrine: Negative for heat intolerance and polyphagia.  Hematologic/Lymphatic: Negative for bleeding problem. Does not bruise/bleed easily.  Skin: Negative for flushing, nail changes, rash and suspicious lesions.  Musculoskeletal: Negative for arthritis, joint pain, muscle cramps, myalgias, neck pain and stiffness.  Gastrointestinal: Negative for abdominal pain, bowel incontinence, diarrhea and excessive appetite.  Genitourinary: Negative for decreased libido, genital sores and incomplete emptying.  Neurological: Negative for brief paralysis, focal weakness, headaches and loss  of balance.  Psychiatric/Behavioral: Negative for altered mental status, depression and suicidal ideas.  Allergic/Immunologic: Negative for HIV exposure and persistent infections.    EKGs/Labs/Other Studies Reviewed:    The following studies were reviewed today:   EKG:  The ekg ordered today demonstrates sinus rhythm, heart rate 99bpm.   TTE 2018 - Left ventricle: The cavity size was normal. Systolic function was   normal. The estimated ejection fraction was in the range of 55%   to 60%. Wall motion was normal; there were no regional wall   motion abnormalities. Left ventricular diastolic function   parameters were normal. - Left atrium: The atrium was mildly dilated.  Monitor in 01/2017 conclusion:  Normal sinus rhythm with average heart  rate 99bpm  Sius tachycardia up to 144bpm.    Recent Labs: 04/24/2019: ALT 28; BUN 15; Creatinine, Ser 0.62; Hemoglobin 14.4; Platelets 332.0; Potassium 4.2; Sodium 138; TSH 2.26  Recent Lipid Panel    Component Value Date/Time   CHOL 224 (H) 12/12/2018 0922   TRIG 123 12/12/2018 0922   HDL 51 12/12/2018 0922   CHOLHDL 4.3 04/25/2018 1318   CHOLHDL 4 07/03/2017 1609   VLDL 19.6 07/03/2017 1609   LDLCALC 148 (H) 12/12/2018 0922    Physical Exam:    VS:  BP 124/88 (BP Location: Left Arm)   Pulse 99   Ht 5\' 2"  (1.575 m)   Wt (!) 330 lb 1.3 oz (149.7 kg)   BMI 60.37 kg/m     Wt Readings from Last 3 Encounters:  05/12/19 (!) 330 lb 1.3 oz (149.7 kg)  04/24/19 (!) 326 lb (147.9 kg)  04/21/19 (!) 306 lb (138.8 kg)     GEN: Well nourished, well developed in no acute distress HEENT: Normal NECK: No JVD; No carotid bruits LYMPHATICS: No lymphadenopathy CARDIAC: S1S2 noted,RRR, no murmurs, rubs, gallops RESPIRATORY:  Clear to auscultation without rales, wheezing or rhonchi  ABDOMEN: Soft, non-tender, non-distended, +bowel sounds, no guarding. EXTREMITIES: No edema, No cyanosis, no clubbing MUSCULOSKELETAL:  No edema; No deformity   SKIN: Warm and dry NEUROLOGIC:  Alert and oriented x 3, non-focal PSYCHIATRIC:  Normal affect, good insight  ASSESSMENT:    1. Essential hypertension   2. Heart palpitations   3. Mixed hyperlipidemia   4. Class 3 severe obesity with serious comorbidity and body mass index (BMI) of 50.0 to 59.9 in adult, unspecified obesity type ([place])    PLAN:    1. The palpitations have resolved since the increase in the metoprolol succinate to 100mg  daily. She is happy about this. I reviewed her echocardiogram done in 2018 as well as her ambulator monitor results. For now no need to repeat these studies. I advised the patient if the palpitations starts to recur to notify my office. At which time we will reassess the need for an ambulatory monitoring.  2. Her blood pressure is also improved on her increase dose of beta blockers.   3. Hyperlipidemia - repeat in 6 months and reassess need for start of statin therapy.   4. Obesity - increase exercise discussed with patient.   The patient is in agreement with the above plan. The patient left the office in stable condition.  The patient will follow up in 6 months or sooner if needed.   Medication Adjustments/Labs and Tests Ordered: Current medicines are reviewed at length with the patient today.  Concerns regarding medicines are outlined above.  Orders Placed This Encounter  Procedures  . EKG 12-Lead   No orders of the defined types were placed in this encounter.   Patient Instructions  Medication Instructions:  Your physician recommends that you continue on your current medications as directed. Please refer to the Current Medication list given to you today.  *If you need a refill on your cardiac medications before your next appointment, please call your pharmacy*  Lab Work: NONE If you have labs (blood work) drawn today and your tests are completely normal, you will receive your results only by: [name] MyChart Message (if you have MyChart) OR . A  paper copy in the mail If you have any lab test that is abnormal or we need to change your treatment, we will call you to review the results.  Testing/Procedures: NONE  Follow-Up: At [facility], you and your health needs are our priority.  As part of our continuing mission to provide you with exceptional heart care, we have created designated Provider Care Teams.  These Care Teams include your primary Cardiologist (physician) and Advanced Practice Providers (APPs -  Physician Assistants and Nurse Practitioners) who all work together to provide you with the care you need, when you need it.  Your next appointment:   6 month(s)  The format for your next appointment:   In Person  Provider:   Berniece Salines, DO  Other Instructions      Adopting a Healthy Lifestyle.  Know what a healthy weight is for you (roughly BMI <25) and aim to maintain this   Aim for 7+ servings of fruits and vegetables daily   65-80+ fluid ounces of water or unsweet tea for healthy kidneys   Limit to max 1 drink of alcohol per day; avoid smoking/tobacco   Limit animal fats in diet for cholesterol and heart health - choose grass fed whenever available   Avoid highly processed foods, and foods high in saturated/trans fats   Aim for low stress - take time to unwind and care for your mental health   Aim for 150 min of moderate intensity exercise weekly for heart health, and weights twice weekly for bone health   Aim for 7-9 hours of sleep daily   When it comes to diets, agreement about the perfect plan isnt easy to find, even among the experts. Experts at the [employer] developed an idea known as the Healthy Eating Plate. Just imagine a plate divided into logical, healthy portions.   The emphasis is on diet quality:   Load up on vegetables and fruits - one-half of your plate: Aim for color and variety, and remember that potatoes dont count.   Go for whole grains - one-quarter of  your plate: Whole wheat, barley, wheat berries, quinoa, oats, brown rice, and foods made with them. If you want pasta, go with whole wheat pasta.   Protein power - one-quarter of your plate: Fish, chicken, beans, and nuts are all healthy, versatile protein sources. Limit red meat.   The diet, however, does go beyond the plate, offering a few other suggestions.   Use healthy plant oils, such as olive, canola, soy, corn, sunflower and peanut. Check the labels, and avoid partially hydrogenated oil, which have unhealthy trans fats.   If youre thirsty, drink water. Coffee and tea are good in moderation, but skip sugary drinks and limit milk and dairy products to one or two daily servings.   The type of carbohydrate in the diet is more important than the amount. Some sources of carbohydrates, such as vegetables, fruits, whole grains, and beans-are healthier than others.   Finally, stay active  Signed, Berniece Salines, DO  05/12/2019 8:00 PM    [facility]

## 2019-05-12 NOTE — Patient Instructions (Signed)
Medication Instructions:  Your physician recommends that you continue on your current medications as directed. Please refer to the Current Medication list given to you today.  *If you need a refill on your cardiac medications before your next appointment, please call your pharmacy*  Lab Work: NONE If you have labs (blood work) drawn today and your tests are completely normal, you will receive your results only by: [name] MyChart Message (if you have MyChart) OR . A paper copy in the mail If you have any lab test that is abnormal or we need to change your treatment, we will call you to review the results.  Testing/Procedures: NONE  Follow-Up: At [facility], you and your health needs are our priority.  As part of our continuing mission to provide you with exceptional heart care, we have created designated Provider Care Teams.  These Care Teams include your primary Cardiologist (physician) and Advanced Practice Providers (APPs -  Physician Assistants and Nurse Practitioners) who all work together to provide you with the care you need, when you need it.  Your next appointment:   6 month(s)  The format for your next appointment:   In Person  Provider:   Berniece Salines, DO  Other Instructions

## 2019-05-13 ENCOUNTER — Telehealth: Payer: Self-pay | Admitting: *Deleted

## 2019-05-13 DIAGNOSIS — J4520 Mild intermittent asthma, uncomplicated: Secondary | ICD-10-CM

## 2019-05-13 MED ORDER — QVAR REDIHALER 40 MCG/ACT IN AERB: 2.0000 | INHALATION_SPRAY | Freq: Two times a day (BID) | RESPIRATORY_TRACT | 5 refills | Status: DC

## 2019-05-13 NOTE — Telephone Encounter (Signed)
Request from [facility] dated 12/3 for QVAR redihaler 81mcg. 2 puffs twice a day.

## 2019-05-14 ENCOUNTER — Ambulatory Visit: Payer: BC Managed Care – PPO | Admitting: Family Medicine

## 2019-05-23 ENCOUNTER — Other Ambulatory Visit: Payer: Self-pay | Admitting: Family Medicine

## 2019-05-23 ENCOUNTER — Other Ambulatory Visit: Payer: Self-pay | Admitting: Psychiatry

## 2019-05-23 DIAGNOSIS — E559 Vitamin D deficiency, unspecified: Secondary | ICD-10-CM

## 2019-05-23 NOTE — Telephone Encounter (Signed)
Apt scheduled 01/13

## 2019-05-23 NOTE — Telephone Encounter (Signed)
Last OV 04/24/19 Last refill 03/03/19 #10/2 Next OV not scheduled  Forwarding to PCP

## 2019-05-26 ENCOUNTER — Other Ambulatory Visit: Payer: Self-pay | Admitting: Psychiatry

## 2019-06-18 ENCOUNTER — Encounter: Payer: Self-pay | Admitting: Psychiatry

## 2019-06-18 ENCOUNTER — Ambulatory Visit (INDEPENDENT_AMBULATORY_CARE_PROVIDER_SITE_OTHER): Payer: BC Managed Care – PPO | Admitting: Psychiatry

## 2019-06-18 DIAGNOSIS — F330 Major depressive disorder, recurrent, mild: Secondary | ICD-10-CM | POA: Diagnosis not present

## 2019-06-18 DIAGNOSIS — F411 Generalized anxiety disorder: Secondary | ICD-10-CM | POA: Diagnosis not present

## 2019-06-18 MED ORDER — SERTRALINE HCL 100 MG PO TABS: 100.0000 mg | ORAL_TABLET | Freq: Every day | ORAL | 3 refills | Status: DC

## 2019-06-18 NOTE — Progress Notes (Signed)
Jeanne [name] [id number] 1981/01/30 39 y.o.  Virtual Visit via Telephone Note  I connected with pt on 06/18/19 at  9:30 AM EST by telephone and verified that I am speaking with the correct person using two identifiers.   I discussed the limitations, risks, security and privacy concerns of performing an evaluation and management service by telephone and the availability of in person appointments. I also discussed with the patient that there may be a patient responsible charge related to this service. The patient expressed understanding and agreed to proceed.   I discussed the assessment and treatment plan with the patient. The patient was provided an opportunity to ask questions and all were answered. The patient agreed with the plan and demonstrated an understanding of the instructions.   The patient was advised to call back or seek an in-person evaluation if the symptoms worsen or if the condition fails to improve as anticipated.  I provided 25 minutes of non-face-to-face time during this encounter.  The patient was located at home.  The provider was located at [place].   Thayer Headings, PMHNP   Subjective:   Patient ID:  Jeanne [name] is a 39 y.o. (DOB [birth year]) female.  Chief Complaint:  Chief Complaint  Patient presents with  . Follow-up    Anxiety, h/o depression    HPI Jeanne [name] presents for follow-up of anxiety and depression. 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 Reports that social isolation has been somewhat difficult.   She reports that her anxiety has increased in other ways. Started an online therapy group in March and finds this helpful. She reports that she will feel tense and irritable when anxious. Notices self-critical thoughts when anxious. Denies excessive worry. Reports that she was having palpitations and went to the ED on 04/21/19. Reports that she followed- up with cardiologist and Metoprolol was  increased. She reports that she has not had any recent palpitation and BP has improved. Denies any other recent physical s/s with anxiety. Reports that episode in November may have been a panic attack and was proceeded by possible dehydration, lack of sleep, and situational stress.  Mood has been "mostly good." Occ irritable. She notices that at times she is wanting to overeat when her anxiety is elevated. She reports that appetite has been increased when anxious or depressed and is learning to recognize this. Reports improved energy and motivation since addition of Wellbutrin SR. She reports that her sleep is adequate overall and is disrupted when mood and anxiety is worse. Averages about 6-7 hours of sleep a night. Has been trying to improve sleep hygiene. Concentration has been good. Denies SI.   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    Review of Systems:  Review of Systems  Cardiovascular: Negative for palpitations.  Musculoskeletal: Negative for gait problem.  Neurological: Negative for tremors.  Psychiatric/Behavioral:       Please refer to HPI    Medications: I have reviewed the patient's current medications.  Current Outpatient Medications  Medication Sig Dispense Refill  . albuterol (PROAIR HFA) 108 (90 Base) MCG/ACT inhaler Inhale 2 puffs into the lungs every 6 (six) hours as needed for wheezing or shortness of breath. 8.5 Inhaler 1  . ALPRAZolam (XANAX) 0.25 MG tablet Take 1 tablet (0.25 mg total) by mouth at bedtime. (Patient taking differently: Take 0.25 mg by mouth at bedtime as needed. ) 20 tablet 0  . beclomethasone (QVAR REDIHALER) 40 MCG/ACT inhaler Inhale 2  puffs into the lungs 2 (two) times daily. 10.6 g 5  . buPROPion (WELLBUTRIN SR) 200 MG 12 hr tablet Take 1 tablet (200 mg total) by mouth daily. 90 tablet 3  . calcium carbonate (TUMS - DOSED IN MG ELEMENTAL CALCIUM) 500 MG chewable tablet Chew 1 tablet by mouth 3 (three) times daily as  needed for indigestion or heartburn.     . fluticasone (FLONASE) 50 MCG/ACT nasal spray SPRAY 2 SPRAYS INTO EACH NOSTRIL EVERY DAY 48 mL 0  . ibuprofen (ADVIL,MOTRIN) 600 MG tablet Take 1 tablet (600 mg total) by mouth every 6 (six) hours as needed (mild pain). 30 tablet 0  . metFORMIN (GLUCOPHAGE) 500 MG tablet TAKE 1 TABLET BY MOUTH TWICE A DAY WITH A MEAL (Patient taking differently: 500 mg daily with breakfast. ) 180 tablet 0  . metoprolol succinate (TOPROL-XL) 100 MG 24 hr tablet Take 1 tablet (100 mg total) by mouth daily. Take with or immediately following a meal. 90 tablet 3  . Multiple Vitamin (MULTIVITAMIN) tablet Take 1 tablet by mouth daily.    Vladimir Faster Glycol-Propyl Glycol (LUBRICANT EYE DROPS) 0.4-0.3 % SOLN Place 1 drop into both eyes 3 (three) times daily as needed (for dry/allergy eyes.).    [name] sertraline (ZOLOFT) 100 MG tablet Take 1 tablet (100 mg total) by mouth daily. 90 tablet 3  . triamcinolone cream (KENALOG) 0.1 % Apply 1 application topically 2 (two) times daily as needed (for eczema on hands.). 30 g 3  . Vitamin D, Ergocalciferol, (DRISDOL) 1.25 MG (50000 UT) CAPS capsule TAKE 1 CAPSULE (50,000 UNITS TOTAL) BY MOUTH EVERY 3 (THREE) DAYS. 30 capsule 1   No current facility-administered medications for this visit.    Medication Side Effects: None  Allergies: No Known Allergies  Past Medical History:  Diagnosis Date  . Anxiety   . Asthma   . Dry eyes   . Dry skin   . Eczema   . Fatigue   . Fibroids   . H/O echocardiogram    only showed tachycardia  . Heartburn   . HTN (hypertension)   . Hyperlipidemia   . Palpitations   . Pre-diabetes     pre diabetes, Metformin ordered from weight loss clinic  . Rash in adult   . Shortness of breath    due to allergies  . Stress   . Tachycardia   . Vitamin D deficiency   . Wheezing     Family History  Problem Relation Age of Onset  . Thyroid disease Mother   . Fibroids Mother   . Deep vein thrombosis Mother    . Cancer Maternal Grandmother        liver   . Cancer Maternal Grandfather        pancreatic  . Hyperlipidemia Father   . Hypertension Father   . Sleep apnea Father   . Obesity Father   . Cancer Paternal Grandfather        prostate    Social History   Socioeconomic History  . Marital status: Married    Spouse name: [name]  . Number of children: Not on file  . Years of education: Not on file  . Highest education level: Not on file  Occupational History  . Occupation: Product manager: [employer]    Comment: 4 th grade  Tobacco Use  . Smoking status: Never Smoker  . Smokeless tobacco: Never Used  Substance and Sexual Activity  . Alcohol use: No  Alcohol/week: 0.0 standard drinks  . Drug use: No  . Sexual activity: Yes    Partners: Male    Birth control/protection: Pill  Other Topics Concern  . Not on file  Social History Narrative   Exercise--- walk in summer--- its been difficult during school year   Social Determinants of Health   Financial Resource Strain:   . Difficulty of Paying Living Expenses: Not on file  Food Insecurity:   . Worried About Charity fundraiser in the Last Year: Not on file  . Ran Out of Food in the Last Year: Not on file  Transportation Needs:   . Lack of Transportation (Medical): Not on file  . Lack of Transportation (Non-Medical): Not on file  Physical Activity:   . Days of Exercise per Week: Not on file  . Minutes of Exercise per Session: Not on file  Stress:   . Feeling of Stress : Not on file  Social Connections:   . Frequency of Communication with Friends and Family: Not on file  . Frequency of Social Gatherings with Friends and Family: Not on file  . Attends Religious Services: Not on file  . Active Member of Clubs or Organizations: Not on file  . Attends Archivist Meetings: Not on file  . Marital Status: Not on file  Intimate Partner Violence:   . Fear of Current or Ex-Partner: Not on file  .  Emotionally Abused: Not on file  . Physically Abused: Not on file  . Sexually Abused: Not on file    Past Medical History, Surgical history, Social history, and Family history were reviewed and updated as appropriate.   Please see review of systems for further details on the patient's review from today.   Objective:   Physical Exam:  There were no vitals taken for this visit.  Physical Exam Neurological:     Mental Status: She is alert and oriented to person, place, and time.     Cranial Nerves: No dysarthria.  Psychiatric:        Attention and Perception: Attention and perception normal.        Speech: Speech normal.        Behavior: Behavior is cooperative.        Thought Content: Thought content normal. Thought content is not paranoid or delusional. Thought content does not include homicidal or suicidal ideation. Thought content does not include homicidal or suicidal plan.        Cognition and Memory: Cognition and memory normal.        Judgment: Judgment normal.     Comments: Insight intact Mood presents as mildly anxious     Lab Review:     Component Value Date/Time   NA 138 04/24/2019 0916   NA 139 12/12/2018 0922   K 4.2 04/24/2019 0916   CL 103 04/24/2019 0916   CO2 27 04/24/2019 0916   GLUCOSE 131 (H) 04/24/2019 0916   BUN 15 04/24/2019 0916   BUN 14 12/12/2018 0922   CREATININE 0.62 04/24/2019 0916   CALCIUM 9.4 04/24/2019 0916   PROT 6.9 04/24/2019 0916   PROT 6.6 12/12/2018 0922   ALBUMIN 4.3 04/24/2019 0916   ALBUMIN 4.4 12/12/2018 0922   AST 19 04/24/2019 0916   ALT 28 04/24/2019 0916   ALKPHOS 87 04/24/2019 0916   BILITOT 0.3 04/24/2019 0916   BILITOT 0.2 12/12/2018 0922   GFRNONAA >60 04/21/2019 1744   GFRAA >60 04/21/2019 1744      No results  found for: POCLITH, LITHIUM   No results found for: PHENYTOIN, PHENOBARB, VALPROATE, CBMZ   .res Assessment: Plan:   Pt seen for 30 minutes and time spent counseling pt and coordinating care re:  changes in medical and social hx since pt has not been seen in over 15 months. Pt reports that she would prefer to continue current plan of care since she reports some improved s/s with current medications. She prefers to not increase medications at this time for mild residual since she prefers to manage anxiety s/s through group therapy and behavioral strategies. She reports that she will contact office if she feels an increase in Sertraline is needed. Will continue Sertraline 100 mg po qd for anxiety and depression. Reports PCP will continue to manage Wellbutrin and that she is no longer consistently using Xanax prn. Pt to f/u in 3 months or sooner if clinically indicated. Patient advised to contact office with any questions, adverse effects, or acute worsening in signs and symptoms.  Jia was seen today for follow-up.  Diagnoses and all orders for this visit:  Mild episode of recurrent major depressive disorder (HCC) -     sertraline (ZOLOFT) 100 MG tablet; Take 1 tablet (100 mg total) by mouth daily.  Generalized anxiety disorder -     sertraline (ZOLOFT) 100 MG tablet; Take 1 tablet (100 mg total) by mouth daily.    Please see After Visit Summary for patient specific instructions.  No future appointments.  No orders of the defined types were placed in this encounter.     -------------------------------

## 2019-06-23 ENCOUNTER — Encounter: Payer: Self-pay | Admitting: Family Medicine

## 2019-06-30 ENCOUNTER — Other Ambulatory Visit (INDEPENDENT_AMBULATORY_CARE_PROVIDER_SITE_OTHER): Payer: Self-pay | Admitting: Physician Assistant

## 2019-06-30 DIAGNOSIS — R7303 Prediabetes: Secondary | ICD-10-CM

## 2019-07-06 ENCOUNTER — Other Ambulatory Visit: Payer: Self-pay | Admitting: Family Medicine

## 2019-07-08 ENCOUNTER — Other Ambulatory Visit: Payer: Self-pay | Admitting: Family Medicine

## 2019-07-08 DIAGNOSIS — J302 Other seasonal allergic rhinitis: Secondary | ICD-10-CM

## 2019-07-08 DIAGNOSIS — G4700 Insomnia, unspecified: Secondary | ICD-10-CM

## 2019-07-08 DIAGNOSIS — H6692 Otitis media, unspecified, left ear: Secondary | ICD-10-CM

## 2019-07-09 MED ORDER — ALPRAZOLAM 0.25 MG PO TABS: 0.2500 mg | ORAL_TABLET | Freq: Every day | ORAL | 0 refills | Status: DC

## 2019-07-09 MED ORDER — FLUTICASONE PROPIONATE 50 MCG/ACT NA SUSP: 2.0000 | Freq: Every day | NASAL | 5 refills | Status: DC

## 2019-07-09 NOTE — Telephone Encounter (Signed)
Requesting: Alprazolam Contract:none [id number] Last Visit:04/24/2019 Next Visit:none scheduled Last Refill: 04/24/2019 Please Advise

## 2019-08-03 ENCOUNTER — Encounter: Payer: Self-pay | Admitting: Family Medicine

## 2019-09-08 ENCOUNTER — Encounter: Payer: Self-pay | Admitting: Family Medicine

## 2019-09-08 NOTE — Telephone Encounter (Signed)
Ov --- she was started on metoprolol for palpitations     Does she have f/u with cardiology

## 2019-09-12 ENCOUNTER — Encounter: Payer: Self-pay | Admitting: Family Medicine

## 2019-09-12 ENCOUNTER — Other Ambulatory Visit: Payer: Self-pay

## 2019-09-12 ENCOUNTER — Ambulatory Visit (INDEPENDENT_AMBULATORY_CARE_PROVIDER_SITE_OTHER): Payer: BC Managed Care – PPO | Admitting: Family Medicine

## 2019-09-12 VITALS — HR 90 | Temp 97.5°F | Ht 62.0 in

## 2019-09-12 DIAGNOSIS — R197 Diarrhea, unspecified: Secondary | ICD-10-CM

## 2019-09-12 DIAGNOSIS — R739 Hyperglycemia, unspecified: Secondary | ICD-10-CM

## 2019-09-12 LAB — COMPREHENSIVE METABOLIC PANEL
ALT: 39 U/L — ABNORMAL HIGH (ref 0–35)
AST: 35 U/L (ref 0–37)
Albumin: 3.9 g/dL (ref 3.5–5.2)
Alkaline Phosphatase: 86 U/L (ref 39–117)
BUN: 8 mg/dL (ref 6–23)
CO2: 24 mEq/L (ref 19–32)
Calcium: 8.3 mg/dL — ABNORMAL LOW (ref 8.4–10.5)
Chloride: 104 mEq/L (ref 96–112)
Creatinine, Ser: 0.57 mg/dL (ref 0.40–1.20)
GFR: 119.47 mL/min (ref >60.00)
Glucose, Bld: 161 mg/dL — ABNORMAL HIGH (ref 70–99)
Potassium: 3.6 mEq/L (ref 3.5–5.1)
Sodium: 139 mEq/L (ref 135–145)
Total Bilirubin: 0.4 mg/dL (ref 0.2–1.2)
Total Protein: 6.1 g/dL (ref 6.0–8.3)

## 2019-09-12 LAB — LIPID PANEL
Cholesterol: 181 mg/dL (ref 0–200)
HDL: 35.5 mg/dL — ABNORMAL LOW (ref >39.00)
LDL Cholesterol: 111 mg/dL — ABNORMAL HIGH (ref 0–99)
NonHDL: 145.45
Total CHOL/HDL Ratio: 5
Triglycerides: 172 mg/dL — ABNORMAL HIGH (ref 0.0–149.0)
VLDL: 34.4 mg/dL (ref 0.0–40.0)

## 2019-09-12 LAB — CBC WITH DIFFERENTIAL/PLATELET
Basophils Absolute: 0 10*3/uL (ref 0.0–0.1)
Basophils Relative: 0.9 % (ref 0.0–3.0)
Eosinophils Absolute: 0.4 10*3/uL (ref 0.0–0.7)
Eosinophils Relative: 7.7 % — ABNORMAL HIGH (ref 0.0–5.0)
HCT: 39.8 % (ref 36.0–46.0)
Hemoglobin: 13.5 g/dL (ref 12.0–15.0)
Lymphocytes Relative: 32.4 % (ref 12.0–46.0)
Lymphs Abs: 1.5 10*3/uL (ref 0.7–4.0)
MCHC: 34 g/dL (ref 30.0–36.0)
MCV: 94.5 fl (ref 78.0–100.0)
Monocytes Absolute: 0.4 10*3/uL (ref 0.1–1.0)
Monocytes Relative: 8 % (ref 3.0–12.0)
Neutro Abs: 2.4 10*3/uL (ref 1.4–7.7)
Neutrophils Relative %: 51 % (ref 43.0–77.0)
Platelets: 184 10*3/uL (ref 150.0–400.0)
RBC: 4.22 Mil/uL (ref 3.87–5.11)
RDW: 13.3 % (ref 11.5–15.5)
WBC: 4.6 10*3/uL (ref 4.0–10.5)

## 2019-09-12 LAB — POC URINALSYSI DIPSTICK (AUTOMATED)
Bilirubin, UA: NEGATIVE
Blood, UA: NEGATIVE
Glucose, UA: NEGATIVE
Ketones, UA: NEGATIVE
Leukocytes, UA: NEGATIVE
Nitrite, UA: NEGATIVE
Protein, UA: POSITIVE — AB
Spec Grav, UA: 1.025 (ref 1.010–1.025)
Urobilinogen, UA: 0.2 E.U./dL
pH, UA: 5.5 (ref 5.0–8.0)

## 2019-09-12 LAB — HEMOGLOBIN A1C: Hgb A1c MFr Bld: 6.3 % (ref 4.6–6.5)

## 2019-09-12 LAB — TSH: TSH: 2.24 u[IU]/mL (ref 0.35–4.50)

## 2019-09-12 NOTE — Progress Notes (Signed)
Virtual Visit via Video Note  I connected with [name] on 09/12/19 at 11:20 AM EDT by a video enabled telemedicine application and verified that I am speaking with the correct person using two identifiers.  Location: Patient: home  Provider: office    I discussed the limitations of evaluation and management by telemedicine and the availability of in person appointments. The patient expressed understanding and agreed to proceed.  History of Present Illness: Pt is home c/o diarrhea since Tues, ha and fatigue with fever 99--- she had rapid test yesterday and it was neg -------she took aspirin last night --- no fever today  Loose stools mult -- up to 10 x a day --- very watery.   No abd pain.   Whenever she eats it comes right.  No one else in family is sick.    Observations/Objective: Vitals:   09/12/19 1128  Pulse: 90  Temp: (!) 97.5 F (36.4 C)     Assessment and Plan: 1. Diarrhea, unspecified type Rapid covid neg  con't prn immodium Check labs and stool culture, c diff Pt is on doxy =--- for over a month now  - CBC with Differential/Platelet; Future - TSH; Future - Comprehensive metabolic panel; Future - POCT Urinalysis Dipstick (Automated); Future - Stool Culture; Future - Clostridium difficile EIA; Future  Follow Up Instructions:    I discussed the assessment and treatment plan with the patient. The patient was provided an opportunity to ask questions and all were answered. The patient agreed with the plan and demonstrated an understanding of the instructions.   The patient was advised to call back or seek an in-person evaluation if the symptoms worsen or if the condition fails to improve as anticipated.  I provided 30 minutes of non-face-to-face time during this encounter.   Ann Held, DO

## 2019-09-12 NOTE — Addendum Note (Signed)
Addended by: Kelle Darting A on: 09/12/2019 04:13 PM   Modules accepted: Orders

## 2019-09-12 NOTE — Addendum Note (Signed)
Addended by: Kelle Darting A on: 09/12/2019 04:35 PM   Modules accepted: Orders

## 2019-09-12 NOTE — Addendum Note (Signed)
Addended by: Kelle Darting A on: 09/12/2019 02:20 PM   Modules accepted: Orders

## 2019-09-14 ENCOUNTER — Other Ambulatory Visit: Payer: Self-pay | Admitting: Family Medicine

## 2019-09-14 LAB — CLOSTRIDIUM DIFFICILE EIA: C difficile Toxins A+B, EIA: NEGATIVE

## 2019-09-15 ENCOUNTER — Encounter: Payer: Self-pay | Admitting: Family Medicine

## 2019-09-16 LAB — STOOL CULTURE: E coli, Shiga toxin Assay: NEGATIVE

## 2019-09-17 ENCOUNTER — Encounter: Payer: Self-pay | Admitting: Obstetrics & Gynecology

## 2019-09-17 ENCOUNTER — Other Ambulatory Visit: Payer: Self-pay

## 2019-09-17 ENCOUNTER — Ambulatory Visit (INDEPENDENT_AMBULATORY_CARE_PROVIDER_SITE_OTHER): Payer: BC Managed Care – PPO | Admitting: Obstetrics & Gynecology

## 2019-09-17 VITALS — BP 141/85 | HR 97 | Ht 62.0 in | Wt 333.0 lb

## 2019-09-17 DIAGNOSIS — N926 Irregular menstruation, unspecified: Secondary | ICD-10-CM

## 2019-09-17 DIAGNOSIS — R7303 Prediabetes: Secondary | ICD-10-CM

## 2019-09-17 DIAGNOSIS — N939 Abnormal uterine and vaginal bleeding, unspecified: Secondary | ICD-10-CM | POA: Diagnosis not present

## 2019-09-17 DIAGNOSIS — Z3202 Encounter for pregnancy test, result negative: Secondary | ICD-10-CM | POA: Diagnosis not present

## 2019-09-17 LAB — POCT URINE PREGNANCY: Preg Test, Ur: NEGATIVE

## 2019-09-17 MED ORDER — METFORMIN HCL ER 500 MG PO TB24: 500.0000 mg | ORAL_TABLET | Freq: Every day | ORAL | 3 refills | Status: DC

## 2019-09-17 NOTE — Progress Notes (Signed)
Abnormal late periods in Jan 21 and March '21.Patient states that her last period was late and was light. Patient took pregnancy test that was negative on 08-19-2019 and 08-22-2019. Patient states she was recently dx with samonella. [name] RN

## 2019-09-17 NOTE — Progress Notes (Signed)
History:  39 y.o. G0P0000 here today for eval of oligomenorrhea. Desires conception. Pt is s/p HSG which showed fallopian tubes patent bilaterally. She does not want to consider IVH but, wants to see if she can try ovulation induction. If they cant conceive, they will adopt.  Her last cycles was delayed by 5 days and lasted 11 days.    The following portions of the patient's history were reviewed and updated as appropriate: allergies, current medications, past family history, past medical history, past social history, past surgical history and problem list.  Review of Systems:  Pertinent items are noted in HPI.    Objective:  Physical Exam BP (!) 141/85   Pulse 97   Ht 5\' 2"  (1.575 m)   Wt (!) 333 lb (151 kg)   LMP 08/20/2019 (Exact Date)   BMI 60.91 kg/m   CONSTITUTIONAL: Well-developed, well-nourished female in no acute distress.  HENT:  Normocephalic, atraumatic EYES: Conjunctivae and EOM are normal. No scleral icterus.  NECK: Normal range of motion SKIN: Skin is warm and dry. No rash noted. Not diaphoretic.No pallor. [place]: Alert and oriented to person, place, and time. Normal coordination.    Labs and Imaging 02/21/2019 CLINICAL DATA:  Dysmenorrhea. Primary infertility. History of myomectomy in 2019.  EXAM: HYSTEROSALPINGOGRAM  TECHNIQUE: Following cleansing of the cervix and vagina with Betadine solution, a hysterosalpingogram was performed using a 5-French hysterosalpingogram catheter and Omnipaque 300 contrast. The patient tolerated the examination without difficulty.  COMPARISON:  07/31/2017 pelvic sonogram.  FLUOROSCOPY TIME:  Radiation Exposure Index (as provided by the fluoroscopic device): 297 mGy  Fluoroscopy Time:  1 minutes 0 seconds  Number of Acquired Images:  6  FINDINGS: There is mild concavity of the fundal uterine cavity contour, which may indicate arcuate configuration of the uterus. No filling defects in the uterine  cavity.  There is opacification of normal caliber bilateral fallopian tubes in their entirety, with spillage of contrast from the fimbriated ends of the fallopian tubes bilaterally into the peritoneal cavity, and with normal dispersed of contrast within the peritoneal cavity bilaterally.  IMPRESSION: 1. Patent normal fallopian tubes bilaterally. 2. Mild concavity of the fundal uterine cavity contour, nonspecific, which may indicate an arcuate configuration of the uterus.  Assessment & Plan:  Irreg menses. Pt wants to conceive. Reviewed options  Semen analysis in partner  Labs: [facility] and FSH  F/u in 4 weeks to discuss starting Letrozole   DM for labs- pt off Glucophage due to diarrhea due to salmonella. Sx resolved.   Restarted Metformin. Will attempts 500XR daily  Total face-to-face time with patient was 20 min.  Greater than 50% was spent in counseling and coordination of care with the patient.   Jeanne [name], M.D., Cherlynn June

## 2019-09-17 NOTE — Patient Instructions (Signed)
Female Infertility  Female infertility refers to a woman's inability to get pregnant (conceive) after a year of having sex regularly (or after 6 months in women over age 37) without using birth control. Infertility can also mean that a woman is not able to carry a pregnancy to full term. Both women and men can have fertility problems. What are the causes? This condition may be caused by:  Problems with reproductive organs. Infertility can result if a woman: ? Has an abnormally short cervix or a cervix that does not remain closed during a pregnancy. ? Has a blockage or scarring in the fallopian tubes. ? Has an abnormally shaped uterus. ? Has uterine fibroids. This is a benign mass of tissue or muscle (tumor) that can develop in the uterus. ? Is not ovulating in a regular way.  Certain medical conditions. These may include: ? Polycystic ovary syndrome (PCOS). This is a hormonal disorder that can cause small cysts to grow on the ovaries. This is the most common cause of infertility in women. ? Endometriosis. This is a condition in which the tissue that lines the uterus (endometrium) grows outside of its normal location. ? Cancer and cancer treatments, such as chemotherapy or radiation. ? Premature ovarian failure. This is when ovaries stop producing eggs and hormones before age 43. ? Sexually transmitted diseases, such as chlamydia or gonorrhea. ? Autoimmune disorders. These are disorders in which the body's defense system (immune system) attacks normal, healthy cells. Infertility can be linked to more than one cause. For some women, the cause of infertility is not known (unexplained infertility). What increases the risk?  Age. A woman's fertility declines with age, especially after her mid-62s.  Being underweight or overweight.  Drinking too much alcohol.  Using drugs such as anabolic steroids, cocaine, and marijuana.  Exercising excessively.  Being exposed to environmental toxins,  such as radiation, pesticides, and certain chemicals. What are the signs or symptoms? The main sign of infertility in women is the inability to get pregnant or carry a pregnancy to full term. How is this diagnosed? This condition may be diagnosed by:  Checking whether you are ovulating each month. The tests may include: ? Blood tests to check hormone levels. ? An ultrasound of the ovaries. ? Taking a small tissue that lines the uterus and checking it under a microscope (endometrial biopsy).  Doing additional tests. This is done if ovulation is normal. Tests may include: ? Hysterosalpingography. This X-ray test can show the shape of the uterus and whether the fallopian tubes are open. ? Laparoscopy. This test uses a lighted tube (laparoscope) to look for problems in the fallopian tubes and other organs. ? Transvaginal ultrasound. This imaging test is used to check for abnormalities in the uterus and ovaries. ? Hysteroscopy. This test uses a lighted tube to check for problems in the cervix and the uterus. To be diagnosed with infertility, both partners will have a physical exam. Both partners will also have an extensive medical and sexual history taken. Additional tests may be done. How is this treated? Treatment depends on the cause of infertility. Most cases of infertility in women are treated with medicine or surgery.  Women may take medicine to: ? Correct ovulation problems. ? Treat other health conditions.  Surgery may be done to: ? Repair damage to the ovaries, fallopian tubes, cervix, or uterus. ? Remove growths from the uterus. ? Remove scar tissue from the uterus, pelvis, or other organs. Assisted reproductive technology (ART) Assisted reproductive technology (  ART) refers to all treatments and procedures that combine eggs and sperm outside the body to try to help a couple conceive. ART is often combined with fertility drugs to stimulate ovulation. Sometimes ART is done using eggs  retrieved from another woman's body (donor eggs) or from previously frozen fertilized eggs (embryos). There are different types of ART. These include:  Intrauterine insemination (IUI). A long, thin tube is used to place sperm directly into a woman's uterus. This procedure: ? Is effective for infertility caused by sperm problems, including low sperm count and low motility. ? Can be used in combination with fertility drugs.  In vitro fertilization (IVF). This is done when a woman's fallopian tubes are blocked or when a man has low sperm count. In this procedure: ? Fertility drugs are used to stimulate the ovaries to produce multiple eggs. ? Once mature, these eggs are removed from the body and combined with the sperm to be fertilized. ? The fertilized eggs are then placed into the woman's uterus. Follow these instructions at home:  Take over-the-counter and prescription medicines only as told by your health care provider.  Do not use any products that contain nicotine or tobacco, such as cigarettes and e-cigarettes. If you need help quitting, ask your health care provider.  If you drink alcohol, limit how much you have to 1 drink a day.  Make dietary changes to lose weight or maintain a healthy weight. Work with your health care provider and a dietitian to set a weight-loss goal that is healthy and reasonable for you.  Seek support from a counselor or support group to talk about your concerns related to infertility. Couples counseling may be helpful for you and your partner.  Practice stress reduction techniques that work well for you, such as regular physical activity, meditation, or deep breathing.  Keep all follow-up visits as told by your health care provider. This is important. Contact a health care provider if you:  Feel that stress is interfering with your life and relationships.  Have side effects from treatments for infertility. Summary  Female infertility refers to a woman's  inability to get pregnant (conceive) after a year of having sex regularly (or after 6 months in women over age 35) without using birth control.  To be diagnosed with infertility, both partners will have a physical exam. Both partners will also have an extensive medical and sexual history taken.  Seek support from a counselor or support group to talk about your concerns related to infertility. Couples counseling may be helpful for you and your partner. This information is not intended to replace advice given to you by your health care provider. Make sure you discuss any questions you have with your health care provider. Document Revised: 09/12/2018 Document Reviewed: 04/23/2017 Elsevier Patient Education  2020 [employer]  

## 2019-09-21 LAB — FOLLICLE STIMULATING HORMONE: FSH: 3.1 m[IU]/mL

## 2019-09-21 LAB — ANTI MULLERIAN HORMONE: ANTI-MULLERIAN HORMONE (AMH): 1.27 ng/mL

## 2019-09-26 ENCOUNTER — Other Ambulatory Visit: Payer: Self-pay | Admitting: Family Medicine

## 2019-09-26 DIAGNOSIS — F3289 Other specified depressive episodes: Secondary | ICD-10-CM

## 2019-10-13 LAB — STATE LABORATORY REPORT

## 2019-10-15 ENCOUNTER — Other Ambulatory Visit: Payer: Self-pay

## 2019-10-15 ENCOUNTER — Telehealth: Payer: BC Managed Care – PPO | Admitting: Obstetrics & Gynecology

## 2019-11-02 ENCOUNTER — Other Ambulatory Visit: Payer: Self-pay | Admitting: Family Medicine

## 2019-11-02 DIAGNOSIS — E559 Vitamin D deficiency, unspecified: Secondary | ICD-10-CM

## 2019-11-20 ENCOUNTER — Other Ambulatory Visit: Payer: Self-pay | Admitting: Family Medicine

## 2019-11-20 DIAGNOSIS — J4520 Mild intermittent asthma, uncomplicated: Secondary | ICD-10-CM

## 2019-11-24 ENCOUNTER — Telehealth: Payer: Self-pay | Admitting: Obstetrics & Gynecology

## 2019-11-24 NOTE — Telephone Encounter (Signed)
TC to pt. Reviewed semen analysis and recommendation to see Urologist. I have also discussed with her again the option of seeing REI. I have informed her that IUI may be an option but she would need to go for a consult and see if this is an option. Pt reports that she would like to pursue this option.   All of her questions were answered. I will put in a note for a referral and she will keep [place] updated on her progress.    Daleiza Bacchi L. Harraway-Smith, M.D., Cherlynn June

## 2019-12-09 ENCOUNTER — Other Ambulatory Visit: Payer: Self-pay

## 2019-12-09 DIAGNOSIS — N926 Irregular menstruation, unspecified: Secondary | ICD-10-CM

## 2019-12-09 DIAGNOSIS — R7303 Prediabetes: Secondary | ICD-10-CM

## 2019-12-09 DIAGNOSIS — N939 Abnormal uterine and vaginal bleeding, unspecified: Secondary | ICD-10-CM

## 2019-12-09 MED ORDER — METFORMIN HCL ER 500 MG PO TB24: 500.0000 mg | ORAL_TABLET | Freq: Every day | ORAL | 3 refills | Status: DC

## 2020-02-05 ENCOUNTER — Ambulatory Visit: Payer: BC Managed Care – PPO | Admitting: Psychiatry

## 2020-03-01 ENCOUNTER — Other Ambulatory Visit: Payer: Self-pay

## 2020-03-01 DIAGNOSIS — R7303 Prediabetes: Secondary | ICD-10-CM

## 2020-03-01 DIAGNOSIS — N926 Irregular menstruation, unspecified: Secondary | ICD-10-CM

## 2020-03-01 DIAGNOSIS — N939 Abnormal uterine and vaginal bleeding, unspecified: Secondary | ICD-10-CM

## 2020-03-01 MED ORDER — METFORMIN HCL ER 500 MG PO TB24: 500.0000 mg | ORAL_TABLET | Freq: Every day | ORAL | 3 refills | Status: DC

## 2020-03-11 ENCOUNTER — Other Ambulatory Visit: Payer: Self-pay

## 2020-03-11 ENCOUNTER — Ambulatory Visit (INDEPENDENT_AMBULATORY_CARE_PROVIDER_SITE_OTHER): Payer: BC Managed Care – PPO | Admitting: Psychiatry

## 2020-03-11 ENCOUNTER — Encounter: Payer: Self-pay | Admitting: Psychiatry

## 2020-03-11 DIAGNOSIS — F331 Major depressive disorder, recurrent, moderate: Secondary | ICD-10-CM | POA: Diagnosis not present

## 2020-03-11 DIAGNOSIS — G4700 Insomnia, unspecified: Secondary | ICD-10-CM | POA: Diagnosis not present

## 2020-03-11 DIAGNOSIS — F411 Generalized anxiety disorder: Secondary | ICD-10-CM | POA: Diagnosis not present

## 2020-03-11 MED ORDER — SERTRALINE HCL 100 MG PO TABS: 150.0000 mg | ORAL_TABLET | Freq: Every day | ORAL | 0 refills | Status: DC

## 2020-03-11 MED ORDER — ALPRAZOLAM 0.25 MG PO TABS: 0.2500 mg | ORAL_TABLET | Freq: Every day | ORAL | 0 refills | Status: DC

## 2020-03-11 NOTE — Progress Notes (Signed)
Jeanne [name] [id number] 1980-08-12 39 y.o.  Subjective:   Patient ID:  Jeanne [name] is a 39 y.o. (DOB February 15, 1981) female.  Chief Complaint:  Chief Complaint  Patient presents with  . Anxiety  . Depression    HPI [name] presents to the office today for follow-up of anxiety and depression. She reports that she has noticed some "mood swings, emotional, and sad a lot." Denies any elevated moods. "Feeling fine and normal and then sad and emotional for no apparent reason." She reports feeling upset by things that normally would not have upset her. She reports that she has been under increased stress. She reports difficulty focusing and sometimes will have difficulty doing lesson plans. She typically has to go home and take a nap before doing lesson plans. Denies feeling nervous. "I feel overwhelmed a lot." Denies any panic s/s. Sleep has been fair. Usually falls asleep on the couch and some nights sleeps on the couch all night and other nights wakes up and then goes to bed. She reports that she has been eating more, particularly after work and seems to eat to calm down or numb emotions. Energy has been low. Joined a gym last week. Motivation has been lower. Reports that she has had passive fleeting suicidal thoughts twice in the last month.   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 She reports that this has been challenging.   She has been stressed with fertility issues and will get her hopes up when period is late. Has apt today to see fertility specialist.   Had been seeing a therapist online who is currently on leave. Has been thinking about reaching out to another therapist.   GAD-7     Office Visit from 04/24/2019 in [facility] [place] Visit from 07/29/2018 in [place]  Total GAD-7 Score 7 0    PHQ2-9     Office Visit from 08/16/2018 in [facility] at [place] Visit from 07/29/2018 in [place] Office Visit from 09/10/2017 in [place] Office Visit from 03/31/2016 in [place] [facility] [place]  PHQ-2 Total Score 0 0 5 0  PHQ-9 Total Score 0 5 18 --       Review of Systems:  Review of Systems  Musculoskeletal: Negative for gait problem.  Neurological: Negative for tremors and headaches.  Psychiatric/Behavioral:       Please refer to HPI    Medications: I have reviewed the patient's current medications.  Current Outpatient Medications  Medication Sig Dispense Refill  . albuterol (VENTOLIN HFA) 108 (90 Base) MCG/ACT inhaler TAKE 2 PUFFS BY MOUTH EVERY 6 HOURS AS NEEDED FOR WHEEZE OR SHORTNESS OF BREATH 90 g 1  . beclomethasone (QVAR REDIHALER) 40 MCG/ACT inhaler Inhale 2 puffs into the lungs 2 (two) times daily. 10.6 g 5  . buPROPion (WELLBUTRIN SR) 200 MG 12 hr tablet TAKE 1 TABLET BY MOUTH EVERY DAY 90 tablet 3  . calcium carbonate (TUMS - DOSED IN MG ELEMENTAL CALCIUM) 500 MG chewable tablet Chew 1 tablet by mouth 3 (three) times daily as needed for indigestion or heartburn.     . Ciclopirox 1 % shampoo Apply topically daily as needed.    . fluticasone (FLONASE) 50 MCG/ACT nasal spray Place 2 sprays into both nostrils daily. 48 mL 5  . ibuprofen (ADVIL,MOTRIN) 600  MG tablet Take 1 tablet (600 mg total) by mouth every 6 (six) hours as needed (mild pain). 30 tablet 0  . metFORMIN (GLUCOPHAGE XR) 500 MG 24 hr tablet Take 1 tablet (500 mg total) by mouth daily with breakfast. 30 tablet 3  . metoprolol succinate (TOPROL-XL) 100 MG 24 hr tablet Take 1 tablet (100 mg total) by mouth daily. Take with or immediately following a meal. 90 tablet 3  . Multiple Vitamin (MULTIVITAMIN) tablet Take 1 tablet by mouth daily.    [name] triamcinolone cream (KENALOG) 0.1 % Apply 1 application topically 2 (two) times daily as needed (for eczema on hands.). 30 g 3  .  Vitamin D, Ergocalciferol, (DRISDOL) 1.25 MG (50000 UNIT) CAPS capsule TAKE 1 CAPSULE (50,000 UNITS TOTAL) BY MOUTH EVERY 3 (THREE) DAYS. 30 capsule 1  . ALPRAZolam (XANAX) 0.25 MG tablet Take 1 tablet (0.25 mg total) by mouth at bedtime. 20 tablet 0  . Polyethyl Glycol-Propyl Glycol (LUBRICANT EYE DROPS) 0.4-0.3 % SOLN Place 1 drop into both eyes 3 (three) times daily as needed (for dry/allergy eyes.).    [name] sertraline (ZOLOFT) 100 MG tablet Take 1.5 tablets (150 mg total) by mouth daily. 135 tablet 0   No current facility-administered medications for this visit.    Medication Side Effects: None  Allergies: No Known Allergies  Past Medical History:  Diagnosis Date  . Anxiety   . Asthma   . Dry eyes   . Dry skin   . Eczema   . Fatigue   . Fibroids   . H/O echocardiogram    only showed tachycardia  . Heartburn   . HTN (hypertension)   . Hyperlipidemia   . Palpitations   . Pre-diabetes     pre diabetes, Metformin ordered from weight loss clinic  . Rash in adult   . Shortness of breath    due to allergies  . Stress   . Tachycardia   . Vitamin D deficiency   . Wheezing     Family History  Problem Relation Age of Onset  . Thyroid disease Mother   . Fibroids Mother   . Deep vein thrombosis Mother   . Cancer Maternal Grandmother        liver   . Cancer Maternal Grandfather        pancreatic  . Hyperlipidemia Father   . Hypertension Father   . Sleep apnea Father   . Obesity Father   . Cancer Paternal Grandfather        prostate    Social History   Socioeconomic History  . Marital status: Married    Spouse name: [name]  . Number of children: Not on file  . Years of education: Not on file  . Highest education level: Not on file  Occupational History  . Occupation: Product manager: [employer]    Comment: 4 th grade  Tobacco Use  . Smoking status: Never Smoker  . Smokeless tobacco: Never Used  Vaping Use  . Vaping Use: Never used  Substance  and Sexual Activity  . Alcohol use: No    Alcohol/week: 0.0 standard drinks  . Drug use: No  . Sexual activity: Yes    Partners: Male    Birth control/protection: Pill  Other Topics Concern  . Not on file  Social History Narrative   Exercise--- walk in summer--- its been difficult during school year   Social Determinants of Health   Financial Resource Strain:   . Difficulty  of Paying Living Expenses: Not on file  Food Insecurity:   . Worried About Charity fundraiser in the Last Year: Not on file  . Ran Out of Food in the Last Year: Not on file  Transportation Needs:   . Lack of Transportation (Medical): Not on file  . Lack of Transportation (Non-Medical): Not on file  Physical Activity:   . Days of Exercise per Week: Not on file  . Minutes of Exercise per Session: Not on file  Stress:   . Feeling of Stress : Not on file  Social Connections:   . Frequency of Communication with Friends and Family: Not on file  . Frequency of Social Gatherings with Friends and Family: Not on file  . Attends Religious Services: Not on file  . Active Member of Clubs or Organizations: Not on file  . Attends Archivist Meetings: Not on file  . Marital Status: Not on file  Intimate Partner Violence:   . Fear of Current or Ex-Partner: Not on file  . Emotionally Abused: Not on file  . Physically Abused: Not on file  . Sexually Abused: Not on file    Past Medical History, Surgical history, Social history, and Family history were reviewed and updated as appropriate.   Please see review of systems for further details on the patient's review from today.   Objective:   Physical Exam:  There were no vitals taken for this visit.  Physical Exam Constitutional:      General: She is not in acute distress. Musculoskeletal:        General: No deformity.  Neurological:     Mental Status: She is alert and oriented to person, place, and time.     Coordination: Coordination normal.   Psychiatric:        Attention and Perception: Attention and perception normal. She does not perceive auditory or visual hallucinations.        Mood and Affect: Mood is anxious and depressed. Affect is not labile, blunt, angry or inappropriate.        Speech: Speech normal.        Behavior: Behavior normal.        Thought Content: Thought content normal. Thought content is not paranoid or delusional. Thought content does not include homicidal or suicidal ideation. Thought content does not include homicidal or suicidal plan.        Cognition and Memory: Cognition and memory normal.        Judgment: Judgment normal.     Comments: Insight intact     Lab Review:     Component Value Date/Time   NA 139 09/12/2019 1419   NA 139 12/12/2018 0922   K 3.6 09/12/2019 1419   CL 104 09/12/2019 1419   CO2 24 09/12/2019 1419   GLUCOSE 161 (H) 09/12/2019 1419   BUN 8 09/12/2019 1419   BUN 14 12/12/2018 0922   CREATININE 0.57 09/12/2019 1419   CALCIUM 8.3 (L) 09/12/2019 1419   PROT 6.1 09/12/2019 1419   PROT 6.6 12/12/2018 0922   ALBUMIN 3.9 09/12/2019 1419   ALBUMIN 4.4 12/12/2018 0922   AST 35 09/12/2019 1419   ALT 39 (H) 09/12/2019 1419   ALKPHOS 86 09/12/2019 1419   BILITOT 0.4 09/12/2019 1419   BILITOT 0.2 12/12/2018 0922   GFRNONAA >60 04/21/2019 1744   GFRAA >60 04/21/2019 1744       Component Value Date/Time   WBC 4.6 09/12/2019 1419   RBC 4.22 09/12/2019 1419  HGB 13.5 09/12/2019 1419   HGB 14.3 04/25/2018 1318   HCT 39.8 09/12/2019 1419   HCT 43.0 04/25/2018 1318   PLT 184.0 09/12/2019 1419   PLT 363 04/25/2018 1318   MCV 94.5 09/12/2019 1419   MCV 90 04/25/2018 1318   MCH 31.9 04/21/2019 1744   MCHC 34.0 09/12/2019 1419   RDW 13.3 09/12/2019 1419   RDW 12.6 04/25/2018 1318   LYMPHSABS 1.5 09/12/2019 1419   LYMPHSABS 2.3 04/25/2018 1318   MONOABS 0.4 09/12/2019 1419   EOSABS 0.4 09/12/2019 1419   EOSABS 0.3 04/25/2018 1318   BASOSABS 0.0 09/12/2019 1419    BASOSABS 0.1 04/25/2018 1318    No results found for: POCLITH, LITHIUM   No results found for: PHENYTOIN, PHENOBARB, VALPROATE, CBMZ   .res Assessment: Plan:   Discussed potential benefits, risks, and side effects of increasing Sertraline to 150 mg po qd to improve mood and anxiety. Discussed that Sertraline is generally considered to be safe in pregnancy and would be safe to continue during pregnancy. Discussed increasing dose since she has had a partial improvement in mood and anxiety s/s with lower doses of Sertraline without adverse effects.  Recommended moving Wellbutrin to morning to improve energy and motivation during the day and minimize sleep disturbance with taking Wellbutrin at HS.  Continue Xanax 0.25 mg po QHS prn insomnia.  Discussed possible therapy referrals.  Pt to follow-up in 4-6 weeks or sooner if clinically indicated.  Patient advised to contact office with any questions, adverse effects, or acute worsening in signs and symptoms.   Jewelz was seen today for anxiety and depression.  Diagnoses and all orders for this visit:  Moderate episode of recurrent major depressive disorder (HCC) -     sertraline (ZOLOFT) 100 MG tablet; Take 1.5 tablets (150 mg total) by mouth daily.  Insomnia, unspecified type -     ALPRAZolam (XANAX) 0.25 MG tablet; Take 1 tablet (0.25 mg total) by mouth at bedtime.  Generalized anxiety disorder -     sertraline (ZOLOFT) 100 MG tablet; Take 1.5 tablets (150 mg total) by mouth daily.     Please see After Visit Summary for patient specific instructions.  Future Appointments  Date Time Provider [place]  04/15/2020  3:00 PM Thayer Headings, PMHNP [facility] None    No orders of the defined types were placed in this encounter.   -------------------------------

## 2020-04-07 ENCOUNTER — Other Ambulatory Visit: Payer: Self-pay | Admitting: Obstetrics and Gynecology

## 2020-04-15 ENCOUNTER — Telehealth: Payer: Self-pay | Admitting: Psychiatry

## 2020-04-15 ENCOUNTER — Encounter: Payer: Self-pay | Admitting: Psychiatry

## 2020-04-15 ENCOUNTER — Telehealth (INDEPENDENT_AMBULATORY_CARE_PROVIDER_SITE_OTHER): Payer: BC Managed Care – PPO | Admitting: Psychiatry

## 2020-04-15 DIAGNOSIS — F411 Generalized anxiety disorder: Secondary | ICD-10-CM | POA: Diagnosis not present

## 2020-04-15 DIAGNOSIS — F331 Major depressive disorder, recurrent, moderate: Secondary | ICD-10-CM

## 2020-04-15 MED ORDER — SERTRALINE HCL 100 MG PO TABS: 200.0000 mg | ORAL_TABLET | Freq: Every day | ORAL | 0 refills | Status: DC

## 2020-04-15 NOTE — Telephone Encounter (Signed)
Ms. nishita, isaacks are scheduled for a virtual visit with your provider today.    Just as we do with appointments in the office, we must obtain your consent to participate.  Your consent will be active for this visit and any virtual visit you may have with one of our providers in the next 365 days.    If you have a MyChart account, I can also send a copy of this consent to you electronically.  All virtual visits are billed to your insurance company just like a traditional visit in the office.  As this is a virtual visit, video technology does not allow for your provider to perform a traditional examination.  This may limit your provider's ability to fully assess your condition.  If your provider identifies any concerns that need to be evaluated in person or the need to arrange testing such as labs, EKG, etc, we will make arrangements to do so.    Although advances in technology are sophisticated, we cannot ensure that it will always work on either your end or our end.  If the connection with a video visit is poor, we may have to switch to a telephone visit.  With either a video or telephone visit, we are not always able to ensure that we have a secure connection.   I need to obtain your verbal consent now.   Are you willing to proceed with your visit today?   Tyesha Joffe has provided verbal consent on 04/15/2020 for a virtual visit (video or telephone).   Thayer Headings, PMHNP 04/15/2020  3:04 PM

## 2020-04-15 NOTE — Progress Notes (Signed)
Jeanne [name] [id number] Jun 24, 1980 39 y.o.  Virtual Visit via Video Note  I connected with pt @ on 04/15/20 at  3:00 PM EST by a video enabled telemedicine application and verified that I am speaking with the correct person using two identifiers.   I discussed the limitations of evaluation and management by telemedicine and the availability of in person appointments. The patient expressed understanding and agreed to proceed.  I discussed the assessment and treatment plan with the patient. The patient was provided an opportunity to ask questions and all were answered. The patient agreed with the plan and demonstrated an understanding of the instructions.   The patient was advised to call back or seek an in-person evaluation if the symptoms worsen or if the condition fails to improve as anticipated.  I provided 30 minutes of non-face-to-face time during this encounter.  The patient was located at home.  The provider was located at [place].   Thayer Headings, PMHNP   Subjective:   Patient ID:  Jeanne [name] is a 39 y.o. (DOB 1981-04-25) female.  Chief Complaint:  Chief Complaint  Patient presents with   Anxiety   Follow-up    Depression    HPI Jeanne [name] presents for follow-up of anxiety and depression. She reports that she has been doing "a lot better." She feels that she has more energy during the day with taking Wellbutrin in the morning. She reports that she has adequate energy now. Mood has been "good... more positive." Denies depressed mood. She reports that she has been able to handle stressors and set backs better. Anxiety has been improved. Continues to feel anxious and overwhelmed. She reports some physical s/s with anxiety. Denies panic attacks. She reports that she is sleeping well. She reports occ middle of the night awakenings and is able to return to sleep without difficulty. Sleeping about 7-8 hours a night. She reports that she notices some  emotional eating and denies any change in this. Motivation has improved. Concentration has also improved. Denies SI.   Husband had a change in leadership at his work. Has had some work and family stress.   Has not received prescription for Xanax.   Has not talked with a therapist recently.   Review of Systems:  Review of Systems  Genitourinary: Positive for menstrual problem.  Musculoskeletal: Negative for gait problem.  Neurological: Negative for tremors.  Psychiatric/Behavioral:       Please refer to HPI   Had ultrasound procedure with fertility specialist. Found to have a polyp and will have  Polyp will be removed in December.  Medications: I have reviewed the patient's current medications.  Current Outpatient Medications  Medication Sig Dispense Refill   ALPRAZolam (XANAX) 0.25 MG tablet Take 1 tablet (0.25 mg total) by mouth at bedtime. (Patient taking differently: Take 0.25 mg by mouth at bedtime as needed. ) 20 tablet 0   beclomethasone (QVAR REDIHALER) 40 MCG/ACT inhaler Inhale 2 puffs into the lungs 2 (two) times daily. 10.6 g 5   buPROPion (WELLBUTRIN SR) 200 MG 12 hr tablet TAKE 1 TABLET BY MOUTH EVERY DAY 90 tablet 3   calcium carbonate (TUMS - DOSED IN MG ELEMENTAL CALCIUM) 500 MG chewable tablet Chew 1 tablet by mouth 3 (three) times daily as needed for indigestion or heartburn.      fluticasone (FLONASE) 50 MCG/ACT nasal spray Place 2 sprays into both nostrils daily. 48 mL 5   ibuprofen (ADVIL,MOTRIN) 600 MG tablet Take 1 tablet (600 mg total) by  mouth every 6 (six) hours as needed (mild pain). 30 tablet 0   metFORMIN (GLUCOPHAGE XR) 500 MG 24 hr tablet Take 1 tablet (500 mg total) by mouth daily with breakfast. 30 tablet 3   metoprolol succinate (TOPROL-XL) 100 MG 24 hr tablet Take 1 tablet (100 mg total) by mouth daily. Take with or immediately following a meal. 90 tablet 3   Multiple Vitamin (MULTIVITAMIN) tablet Take 1 tablet by mouth daily.     sertraline  (ZOLOFT) 100 MG tablet Take 2 tablets (200 mg total) by mouth daily. 180 tablet 0   triamcinolone cream (KENALOG) 0.1 % Apply 1 application topically 2 (two) times daily as needed (for eczema on hands.). 30 g 3   Vitamin D, Ergocalciferol, (DRISDOL) 1.25 MG (50000 UNIT) CAPS capsule TAKE 1 CAPSULE (50,000 UNITS TOTAL) BY MOUTH EVERY 3 (THREE) DAYS. 30 capsule 1   albuterol (VENTOLIN HFA) 108 (90 Base) MCG/ACT inhaler TAKE 2 PUFFS BY MOUTH EVERY 6 HOURS AS NEEDED FOR WHEEZE OR SHORTNESS OF BREATH 90 g 1   Ciclopirox 1 % shampoo Apply topically daily as needed.     Polyethyl Glycol-Propyl Glycol (LUBRICANT EYE DROPS) 0.4-0.3 % SOLN Place 1 drop into both eyes 3 (three) times daily as needed (for dry/allergy eyes.).     No current facility-administered medications for this visit.    Medication Side Effects: None  Allergies: No Known Allergies  Past Medical History:  Diagnosis Date   Anxiety    Asthma    Dry eyes    Dry skin    Eczema    Fatigue    Fibroids    H/O echocardiogram    only showed tachycardia   Heartburn    HTN (hypertension)    Hyperlipidemia    Palpitations    Pre-diabetes     pre diabetes, Metformin ordered from weight loss clinic   Rash in adult    Shortness of breath    due to allergies   Stress    Tachycardia    Vitamin D deficiency    Wheezing     Family History  Problem Relation Age of Onset   Thyroid disease Mother    Fibroids Mother    Deep vein thrombosis Mother    Cancer Maternal Grandmother        liver    Cancer Maternal Grandfather        pancreatic   Hyperlipidemia Father    Hypertension Father    Sleep apnea Father    Obesity Father    Cancer Paternal Grandfather        prostate    Social History   Socioeconomic History   Marital status: Married    Spouse name: Jeanne [name]   Number of children: Not on file   Years of education: Not on file   Highest education level: Not on file  Occupational  History   Occupation: Product manager: [employer]    Comment: 4 th grade  Tobacco Use   Smoking status: Never Smoker   Smokeless tobacco: Never Used  Vaping Use   Vaping Use: Never used  Substance and Sexual Activity   Alcohol use: No    Alcohol/week: 0.0 standard drinks   Drug use: No   Sexual activity: Yes    Partners: Male    Birth control/protection: Pill  Other Topics Concern   Not on file  Social History Narrative   Exercise--- walk in summer--- its been difficult during school year  Social Determinants of Health   Financial Resource Strain:    Difficulty of Paying Living Expenses: Not on file  Food Insecurity:    Worried About Charity fundraiser in the Last Year: Not on file   [employer] of Food in the Last Year: Not on file  Transportation Needs:    Lack of Transportation (Medical): Not on file   Lack of Transportation (Non-Medical): Not on file  Physical Activity:    Days of Exercise per Week: Not on file   Minutes of Exercise per Session: Not on file  Stress:    Feeling of Stress : Not on file  Social Connections:    Frequency of Communication with Friends and Family: Not on file   Frequency of Social Gatherings with Friends and Family: Not on file   Attends Religious Services: Not on file   Active Member of Clubs or Organizations: Not on file   Attends Archivist Meetings: Not on file   Marital Status: Not on file  Intimate Partner Violence:    Fear of Current or Ex-Partner: Not on file   Emotionally Abused: Not on file   Physically Abused: Not on file   Sexually Abused: Not on file    Past Medical History, Surgical history, Social history, and Family history were reviewed and updated as appropriate.   Please see review of systems for further details on the patient's review from today.   Objective:   Physical Exam:  There were no vitals taken for this visit.  Physical Exam Neurological:      Mental Status: She is alert and oriented to person, place, and time.     Cranial Nerves: No dysarthria.  Psychiatric:        Attention and Perception: Attention and perception normal.        Mood and Affect: Mood is anxious. Mood is not depressed.        Speech: Speech normal.        Behavior: Behavior is cooperative.        Thought Content: Thought content normal. Thought content is not paranoid or delusional. Thought content does not include homicidal or suicidal ideation. Thought content does not include homicidal or suicidal plan.        Cognition and Memory: Cognition and memory normal.        Judgment: Judgment normal.     Comments: Insight intact     Lab Review:     Component Value Date/Time   NA 139 09/12/2019 1419   NA 139 12/12/2018 0922   K 3.6 09/12/2019 1419   CL 104 09/12/2019 1419   CO2 24 09/12/2019 1419   GLUCOSE 161 (H) 09/12/2019 1419   BUN 8 09/12/2019 1419   BUN 14 12/12/2018 0922   CREATININE 0.57 09/12/2019 1419   CALCIUM 8.3 (L) 09/12/2019 1419   PROT 6.1 09/12/2019 1419   PROT 6.6 12/12/2018 0922   ALBUMIN 3.9 09/12/2019 1419   ALBUMIN 4.4 12/12/2018 0922   AST 35 09/12/2019 1419   ALT 39 (H) 09/12/2019 1419   ALKPHOS 86 09/12/2019 1419   BILITOT 0.4 09/12/2019 1419   BILITOT 0.2 12/12/2018 0922   GFRNONAA >60 04/21/2019 1744   GFRAA >60 04/21/2019 1744       Component Value Date/Time   WBC 4.6 09/12/2019 1419   RBC 4.22 09/12/2019 1419   HGB 13.5 09/12/2019 1419   HGB 14.3 04/25/2018 1318   HCT 39.8 09/12/2019 1419   HCT 43.0 04/25/2018 1318  PLT 184.0 09/12/2019 1419   PLT 363 04/25/2018 1318   MCV 94.5 09/12/2019 1419   MCV 90 04/25/2018 1318   MCH 31.9 04/21/2019 1744   MCHC 34.0 09/12/2019 1419   RDW 13.3 09/12/2019 1419   RDW 12.6 04/25/2018 1318   LYMPHSABS 1.5 09/12/2019 1419   LYMPHSABS 2.3 04/25/2018 1318   MONOABS 0.4 09/12/2019 1419   EOSABS 0.4 09/12/2019 1419   EOSABS 0.3 04/25/2018 1318   BASOSABS 0.0 09/12/2019  1419   BASOSABS 0.1 04/25/2018 1318    No results found for: POCLITH, LITHIUM   No results found for: PHENYTOIN, PHENOBARB, VALPROATE, CBMZ   .res Assessment: Plan:   Discussed potential benefits, risks, and side effects of increasing Sertraline to 200 mg po qd to improve anxiety since anxiety s/s have partially improved with lower doses. Pt agrees to increase in Sertraline.  Continue Xanax prn anxiety. Pt to follow-up in 4-6 weeks or sooner if clinically indicated.  Patient advised to contact office with any questions, adverse effects, or acute worsening in signs and symptoms.  Jeanne [name] was seen today for anxiety and follow-up.  Diagnoses and all orders for this visit:  Moderate episode of recurrent major depressive disorder (HCC) -     sertraline (ZOLOFT) 100 MG tablet; Take 2 tablets (200 mg total) by mouth daily.  Generalized anxiety disorder -     sertraline (ZOLOFT) 100 MG tablet; Take 2 tablets (200 mg total) by mouth daily.     Please see After Visit Summary for patient specific instructions.  Future Appointments  Date Time Provider [place]  05/14/2020 10:00 AM [facility] PAT 2 [facility] None  06/02/2020 11:30 AM Thayer Headings, PMHNP CP-CP None    No orders of the defined types were placed in this encounter.     -------------------------------

## 2020-05-13 NOTE — Patient Instructions (Addendum)
DUE TO COVID-19 ONLY ONE VISITOR IS ALLOWED TO COME WITH YOU AND STAY IN THE WAITING ROOM ONLY DURING PRE OP AND PROCEDURE.   IF YOU WILL BE ADMITTED INTO THE HOSPITAL YOU ARE ALLOWED ONE SUPPORT PERSON DURING VISITATION HOURS ONLY (10AM -8PM)   . The support person may change daily. . The support person must pass our screening, gel in and out, and wear a mask at all times, including in the patient's room. . Patients must also wear a mask when staff or their support person are in the room.   COVID SWAB TESTING MUST BE COMPLETED ON:  Monday, 05-17-20 @ 11:10 AM    [address] [place], [place] [address]  (Must self quarantine after testing. Follow instructions on handout.)        Your procedure is scheduled on:  Wednesday, 05-19-20   Report to [facility] Main  Entrance   Report to admitting at 11:00 AM   Call this number if you have problems the morning of surgery [phone]   Do not eat food :After Midnight.   May have liquids until 10:00 AM day of surgery  CLEAR LIQUID DIET  Foods Allowed                                                                     Foods Excluded  Water, Black Coffee and tea, regular and decaf             liquids that you cannot  Plain Jell-O in any flavor  (No red)                                    see through such as: Fruit ices (not with fruit pulp)                                      milk, soups, orange juice              Iced Popsicles (No red)                                      All solid food                                   Apple juices Sports drinks like Gatorade (No red) Lightly seasoned clear broth or consume(fat free) Sugar, honey syrup      Oral Hygiene is also important to reduce your risk of infection.                                    Remember - BRUSH YOUR TEETH THE MORNING OF SURGERY WITH YOUR REGULAR TOOTHPASTE   Do NOT smoke after Midnight   Take these medicines the morning of surgery with A SIP OF WATER:   Wellbutrin, Okay to use inhalers  You may not have any metal on your body including hair pins, jewelry, and body piercings             Do not wear make-up, lotions, powders, perfumes/cologne, or deodorant             Do not wear nail polish.  Do not shave  48 hours prior to surgery.            Do not bring valuables to the hospital. [place].   Contacts, dentures or bridgework may not be worn into surgery.   Patients discharged the day of surgery will not be allowed to drive home.              Please read over the following fact sheets you were given: IF YOU HAVE QUESTIONS ABOUT YOUR PRE OP INSTRUCTIONS PLEASE CALL [phone]   [facility] - Preparing for Surgery Before surgery, you can play an important role.  Because skin is not sterile, your skin needs to be as free of germs as possible.  You can reduce the number of germs on your skin by washing with CHG (chlorahexidine gluconate) soap before surgery.  CHG is an antiseptic cleaner which kills germs and bonds with the skin to continue killing germs even after washing. Please DO NOT use if you have an allergy to CHG or antibacterial soaps.  If your skin becomes reddened/irritated stop using the CHG and inform your nurse when you arrive at Short Stay. Do not shave (including legs and underarms) for at least 48 hours prior to the first CHG shower.  You may shave your face/neck.  Please follow these instructions carefully:  1.  Shower with CHG Soap the night before surgery and the  morning of surgery.  2.  If you choose to wash your hair, wash your hair first as usual with your normal  shampoo.  3.  After you shampoo, rinse your hair and body thoroughly to remove the shampoo.                             4.  Use CHG as you would any other liquid soap.  You can apply chg directly to the skin and wash.  Gently with a scrungie or clean washcloth.  5.  Apply the CHG Soap to  your body ONLY FROM THE NECK DOWN.   Do   not use on face/ open                           Wound or open sores. Avoid contact with eyes, ears mouth and   genitals (private parts).                       Wash face,  Genitals (private parts) with your normal soap.             6.  Wash thoroughly, paying special attention to the area where your    surgery  will be performed.  7.  Thoroughly rinse your body with warm water from the neck down.  8.  DO NOT shower/wash with your normal soap after using and rinsing off the CHG Soap.                9.  Pat yourself dry with a clean towel.  10.  Wear clean pajamas.            11.  Place clean sheets on your bed the night of your first shower and do not  sleep with pets. Day of Surgery : Do not apply any lotions/deodorants the morning of surgery.  Please wear clean clothes to the hospital/surgery center.  FAILURE TO FOLLOW THESE INSTRUCTIONS MAY RESULT IN THE CANCELLATION OF YOUR SURGERY  PATIENT SIGNATURE_________________________________  NURSE SIGNATURE__________________________________  ________________________________________________________________________

## 2020-05-13 NOTE — Progress Notes (Addendum)
COVID Vaccine Completed:   x3 Date COVID Vaccine completed:08-08-19 & 08-29-19 Moderna Booster 04-15-20 COVID vaccine manufacturer: [employer]   PCP - Roma Schanz, DO Cardiologist - Berniece Salines, DO  Chest x-ray -  EKG - 05-14-20 in Epic Stress Test -  ECHO - 12-11-16 in Epic Cardiac Cath -  Pacemaker/ICD device last checked: Non-Telemetry Monitoring - 01-16-17 in Epic  Sleep Study - N/A CPAP -   Fasting Blood Sugar - N/A Checks Blood Sugar _____ times a day  Blood Thinner Instructions: Aspirin Instructions: Last Dose:  Anesthesia review: Hx of palpitations with cardiology assessment.  Diagnosed with tachycardia  Patient denies shortness of breath, fever, cough and chest pain at PAT appointment.  Pt able to climb a flight of stairs and perform ADLs without assistance.   Patient verbalized understanding of instructions that were given to them at the PAT appointment. Patient was also instructed that they will need to review over the PAT instructions again at home before surgery.

## 2020-05-14 ENCOUNTER — Encounter (HOSPITAL_COMMUNITY)
Admission: RE | Admit: 2020-05-14 | Discharge: 2020-05-14 | Disposition: A | Discharge status: Home / Self Care | Payer: BC Managed Care – PPO | Source: Ambulatory Visit | Attending: Obstetrics and Gynecology | Admitting: Obstetrics and Gynecology

## 2020-05-14 ENCOUNTER — Encounter (HOSPITAL_COMMUNITY): Payer: Self-pay

## 2020-05-14 ENCOUNTER — Other Ambulatory Visit: Payer: Self-pay

## 2020-05-14 DIAGNOSIS — I10 Essential (primary) hypertension: Secondary | ICD-10-CM | POA: Insufficient documentation

## 2020-05-14 DIAGNOSIS — Z01818 Encounter for other preprocedural examination: Secondary | ICD-10-CM | POA: Diagnosis not present

## 2020-05-14 LAB — BASIC METABOLIC PANEL
Anion gap: 1 — ABNORMAL LOW (ref 5–15)
BUN: 14 mg/dL (ref 6–20)
CO2: 31 mmol/L (ref 22–32)
Calcium: 9.3 mg/dL (ref 8.9–10.3)
Chloride: 104 mmol/L (ref 98–111)
Creatinine, Ser: 0.59 mg/dL (ref 0.44–1.00)
GFR, Estimated: >60 mL/min (ref >60)
Glucose, Bld: 122 mg/dL — ABNORMAL HIGH (ref 70–99)
Potassium: 4 mmol/L (ref 3.5–5.1)
Sodium: 136 mmol/L (ref 135–145)

## 2020-05-14 LAB — CBC
HCT: 42.3 % (ref 36.0–46.0)
Hemoglobin: 13.9 g/dL (ref 12.0–15.0)
MCH: 30.9 pg (ref 26.0–34.0)
MCHC: 32.9 g/dL (ref 30.0–36.0)
MCV: 94 fL (ref 80.0–100.0)
Platelets: 304 10*3/uL (ref 150–400)
RBC: 4.5 MIL/uL (ref 3.87–5.11)
RDW: 13 % (ref 11.5–15.5)
WBC: 7.6 10*3/uL (ref 4.0–10.5)
nRBC: 0 % (ref 0.0–0.2)

## 2020-05-14 LAB — HEMOGLOBIN A1C
Hgb A1c MFr Bld: 6.4 % — ABNORMAL HIGH (ref 4.8–5.6)
Mean Plasma Glucose: 136.98 mg/dL

## 2020-05-17 ENCOUNTER — Other Ambulatory Visit (HOSPITAL_COMMUNITY)
Admission: RE | Admit: 2020-05-17 | Discharge: 2020-05-17 | Disposition: A | Discharge status: Home / Self Care | Payer: BC Managed Care – PPO | Source: Ambulatory Visit | Attending: Obstetrics and Gynecology | Admitting: Obstetrics and Gynecology

## 2020-05-17 DIAGNOSIS — Z01812 Encounter for preprocedural laboratory examination: Secondary | ICD-10-CM | POA: Insufficient documentation

## 2020-05-17 DIAGNOSIS — Z6841 Body Mass Index (BMI) 40.0 and over, adult: Secondary | ICD-10-CM | POA: Diagnosis not present

## 2020-05-17 DIAGNOSIS — N840 Polyp of corpus uteri: Secondary | ICD-10-CM | POA: Diagnosis present

## 2020-05-17 DIAGNOSIS — Z20822 Contact with and (suspected) exposure to covid-19: Secondary | ICD-10-CM | POA: Diagnosis not present

## 2020-05-17 DIAGNOSIS — Z79899 Other long term (current) drug therapy: Secondary | ICD-10-CM | POA: Diagnosis not present

## 2020-05-17 DIAGNOSIS — Z7984 Long term (current) use of oral hypoglycemic drugs: Secondary | ICD-10-CM | POA: Diagnosis not present

## 2020-05-17 LAB — SARS CORONAVIRUS 2 (TAT 6-24 HRS): SARS Coronavirus 2: NEGATIVE

## 2020-05-19 ENCOUNTER — Ambulatory Visit (HOSPITAL_COMMUNITY): Payer: BC Managed Care – PPO | Admitting: Anesthesiology

## 2020-05-19 ENCOUNTER — Encounter (HOSPITAL_COMMUNITY)
Admission: RE | Disposition: A | Discharge status: Home / Self Care | Payer: Self-pay | Source: Other Acute Inpatient Hospital | Attending: Obstetrics and Gynecology

## 2020-05-19 ENCOUNTER — Ambulatory Visit (HOSPITAL_COMMUNITY)
Admission: RE | Admit: 2020-05-19 | Discharge: 2020-05-19 | Disposition: A | Discharge status: Home / Self Care | Payer: BC Managed Care – PPO | Source: Other Acute Inpatient Hospital | Attending: Obstetrics and Gynecology | Admitting: Obstetrics and Gynecology

## 2020-05-19 ENCOUNTER — Encounter (HOSPITAL_COMMUNITY): Payer: Self-pay | Admitting: Obstetrics and Gynecology

## 2020-05-19 DIAGNOSIS — Z7984 Long term (current) use of oral hypoglycemic drugs: Secondary | ICD-10-CM | POA: Insufficient documentation

## 2020-05-19 DIAGNOSIS — N840 Polyp of corpus uteri: Secondary | ICD-10-CM | POA: Diagnosis not present

## 2020-05-19 DIAGNOSIS — Z6841 Body Mass Index (BMI) 40.0 and over, adult: Secondary | ICD-10-CM | POA: Insufficient documentation

## 2020-05-19 DIAGNOSIS — Z20822 Contact with and (suspected) exposure to covid-19: Secondary | ICD-10-CM | POA: Insufficient documentation

## 2020-05-19 DIAGNOSIS — Z79899 Other long term (current) drug therapy: Secondary | ICD-10-CM | POA: Insufficient documentation

## 2020-05-19 HISTORY — PX: HYSTEROSCOPY: SHX211

## 2020-05-19 LAB — TYPE AND SCREEN
ABO/RH(D): A POS
Antibody Screen: NEGATIVE

## 2020-05-19 LAB — GLUCOSE, CAPILLARY: Glucose-Capillary: 108 mg/dL — ABNORMAL HIGH (ref 70–99)

## 2020-05-19 LAB — PREGNANCY, URINE: Preg Test, Ur: NEGATIVE

## 2020-05-19 SURGERY — HYSTEROSCOPY
Anesthesia: General

## 2020-05-19 MED ORDER — SODIUM CHLORIDE (PF) 0.9 % IJ SOLN
INTRAMUSCULAR | Status: AC
  Filled 2020-05-19: qty 10

## 2020-05-19 MED ORDER — CHLORHEXIDINE GLUCONATE 0.12 % MT SOLN
15.0000 mL | Freq: Once | OROMUCOSAL | Status: AC
  Administered 2020-05-19: 15 mL via OROMUCOSAL

## 2020-05-19 MED ORDER — PROPOFOL 10 MG/ML IV BOLUS
INTRAVENOUS | Status: DC | PRN
  Administered 2020-05-19: 50 mg via INTRAVENOUS
  Administered 2020-05-19: 300 mg via INTRAVENOUS

## 2020-05-19 MED ORDER — MIDAZOLAM HCL 2 MG/2ML IJ SOLN
INTRAMUSCULAR | Status: AC
  Filled 2020-05-19: qty 2

## 2020-05-19 MED ORDER — FENTANYL CITRATE (PF) 100 MCG/2ML IJ SOLN
INTRAMUSCULAR | Status: DC | PRN
  Administered 2020-05-19 (×2): 100 ug via INTRAVENOUS

## 2020-05-19 MED ORDER — LIDOCAINE 2% (20 MG/ML) 5 ML SYRINGE
INTRAMUSCULAR | Status: DC | PRN
  Administered 2020-05-19: 100 mg via INTRAVENOUS

## 2020-05-19 MED ORDER — SODIUM CHLORIDE 0.9 % IR SOLN
Status: DC | PRN
  Administered 2020-05-19: 3000 mL

## 2020-05-19 MED ORDER — ACETAMINOPHEN 500 MG PO TABS
1000.0000 mg | ORAL_TABLET | Freq: Once | ORAL | Status: AC
  Administered 2020-05-19: 1000 mg via ORAL
  Filled 2020-05-19: qty 2

## 2020-05-19 MED ORDER — POVIDONE-IODINE 10 % EX SWAB
2.0000 "application " | Freq: Once | CUTANEOUS | Status: AC
  Administered 2020-05-19: 2 via TOPICAL

## 2020-05-19 MED ORDER — FENTANYL CITRATE (PF) 100 MCG/2ML IJ SOLN
INTRAMUSCULAR | Status: AC
  Filled 2020-05-19: qty 2

## 2020-05-19 MED ORDER — MIDAZOLAM HCL 5 MG/5ML IJ SOLN
INTRAMUSCULAR | Status: DC | PRN
  Administered 2020-05-19: 2 mg via INTRAVENOUS

## 2020-05-19 MED ORDER — ONDANSETRON HCL 4 MG/2ML IJ SOLN
INTRAMUSCULAR | Status: DC | PRN
  Administered 2020-05-19: 4 mg via INTRAVENOUS

## 2020-05-19 MED ORDER — FENTANYL CITRATE (PF) 100 MCG/2ML IJ SOLN: 25.0000 ug | INTRAMUSCULAR | Status: DC | PRN

## 2020-05-19 MED ORDER — VASOPRESSIN 20 UNIT/ML IV SOLN
INTRAVENOUS | Status: AC
  Filled 2020-05-19: qty 1

## 2020-05-19 MED ORDER — FENTANYL CITRATE (PF) 100 MCG/2ML IJ SOLN
INTRAMUSCULAR | Status: AC
  Administered 2020-05-19: 50 ug via INTRAVENOUS
  Filled 2020-05-19: qty 2

## 2020-05-19 MED ORDER — PHENYLEPHRINE 40 MCG/ML (10ML) SYRINGE FOR IV PUSH (FOR BLOOD PRESSURE SUPPORT)
PREFILLED_SYRINGE | INTRAVENOUS | Status: DC | PRN
  Administered 2020-05-19: 80 ug via INTRAVENOUS

## 2020-05-19 MED ORDER — LACTATED RINGERS IV SOLN: INTRAVENOUS | Status: DC

## 2020-05-19 MED ORDER — PROPOFOL 10 MG/ML IV BOLUS
INTRAVENOUS | Status: AC
  Filled 2020-05-19: qty 40

## 2020-05-19 MED ORDER — DEXAMETHASONE SODIUM PHOSPHATE 10 MG/ML IJ SOLN
INTRAMUSCULAR | Status: DC | PRN
  Administered 2020-05-19: 5 mg via INTRAVENOUS

## 2020-05-19 MED ORDER — AMISULPRIDE (ANTIEMETIC) 5 MG/2ML IV SOLN: 10.0000 mg | Freq: Once | INTRAVENOUS | Status: DC | PRN

## 2020-05-19 MED ORDER — SODIUM CHLORIDE (PF) 0.9 % IJ SOLN
INTRAMUSCULAR | Status: AC
  Filled 2020-05-19: qty 50

## 2020-05-19 MED ORDER — CEFAZOLIN SODIUM-DEXTROSE 2-4 GM/100ML-% IV SOLN
INTRAVENOUS | Status: AC
  Filled 2020-05-19: qty 100

## 2020-05-19 MED ORDER — SUCCINYLCHOLINE CHLORIDE 20 MG/ML IJ SOLN
INTRAMUSCULAR | Status: DC | PRN
  Administered 2020-05-19: 160 mg via INTRAVENOUS

## 2020-05-19 MED ORDER — ORAL CARE MOUTH RINSE: 15.0000 mL | Freq: Once | OROMUCOSAL | Status: AC

## 2020-05-19 SURGICAL SUPPLY — 27 items
BIPOLAR CUTTING LOOP 21FR (ELECTRODE)
CANISTER SUCT 3000ML PPV (MISCELLANEOUS) ×2 IMPLANT
CANNULA CURETTE W/SYR 6 (CANNULA) IMPLANT
CANNULA CURETTE W/SYR 7 (CANNULA) IMPLANT
CATH NOVY CORNUAL CURVED 5.0 (CATHETERS) IMPLANT
CATH ROBINSON RED A/P 16FR (CATHETERS) ×2 IMPLANT
COVER WAND RF STERILE (DRAPES) IMPLANT
DEVICE MYOSURE REACH (MISCELLANEOUS) ×2 IMPLANT
DILATOR CANAL MILEX (MISCELLANEOUS) IMPLANT
DRAPE C-ARM 42X120 X-RAY (DRAPES) IMPLANT
ELECT BIPOLAR KNIFE NDL PTD 7M (ELECTRODE) IMPLANT
GAUZE 4X4 16PLY RFD (DISPOSABLE) ×2 IMPLANT
GLOVE BIO SURGEON STRL SZ8 (GLOVE) ×2 IMPLANT
GOWN STRL REUS W/TWL XL LVL3 (GOWN DISPOSABLE) ×4 IMPLANT
KIT PROCEDURE FLUENT (KITS) IMPLANT
KIT TURNOVER KIT A (KITS) IMPLANT
LOOP CUTTING BIPOLAR 21FR (ELECTRODE) IMPLANT
PACK VAGINAL MINOR WOMEN LF (CUSTOM PROCEDURE TRAY) ×2 IMPLANT
PAD OB MATERNITY 4.3X12.25 (PERSONAL CARE ITEMS) ×2 IMPLANT
PENCIL SMOKE EVACUATOR (MISCELLANEOUS) IMPLANT
SEAL ROD LENS SCOPE MYOSURE (ABLATOR) ×2 IMPLANT
SEPRAFILM MEMBRANE 5X6 (MISCELLANEOUS) IMPLANT
SET IRRIG Y TYPE TUR BLADDER L (SET/KITS/TRAYS/PACK) ×2 IMPLANT
STENT BALLN UTERINE 3CM 6FR (STENTS) IMPLANT
STENT BALLN UTERINE 4CM 6FR (STENTS) IMPLANT
SYRINGE 3CC/18X1.5 ECLIPSE (MISCELLANEOUS) ×2 IMPLANT
TOWEL OR 17X26 10 PK STRL BLUE (TOWEL DISPOSABLE) ×4 IMPLANT

## 2020-05-19 NOTE — Anesthesia Procedure Notes (Signed)
Procedure Name: Intubation Performed by: Gean Maidens, CRNA Pre-anesthesia Checklist: Patient identified, Emergency Drugs available, Suction available, Patient being monitored and Timeout performed Patient Re-evaluated:Patient Re-evaluated prior to induction Oxygen Delivery Method: Circle system utilized Preoxygenation: Pre-oxygenation with 100% oxygen Induction Type: IV induction Ventilation: Mask ventilation without difficulty Laryngoscope Size: Mac and 4 Grade View: Grade II Tube type: Oral Tube size: 7.0 mm Number of attempts: 1 Airway Equipment and Method: Stylet Placement Confirmation: ETT inserted through vocal cords under direct vision,  positive ETCO2 and breath sounds checked- equal and bilateral Secured at: 21 cm Tube secured with: Tape Dental Injury: Teeth and Oropharynx as per pre-operative assessment

## 2020-05-19 NOTE — Transfer of Care (Signed)
Immediate Anesthesia Transfer of Care Note  Patient: Jeanne [name]  Procedure(s) Performed: HYSTEROSCOPY POLYPECTOMY WITH MYOSURE AND D&C (N/A )  Patient Location: PACU  Anesthesia Type:General  Level of Consciousness: awake, alert  and oriented  Airway & Oxygen Therapy: Patient Spontanous Breathing and Patient connected to face mask oxygen  Post-op Assessment: Report given to RN and Post -op Vital signs reviewed and stable   Post vital signs: Reviewed and stable  Last Vitals:  Vitals Value Taken Time  BP 135/89 05/19/20 1531  Temp    Pulse 83 05/19/20 1532  Resp 22 05/19/20 1532  SpO2 95 % 05/19/20 1532  Vitals shown include unvalidated device data.  Last Pain:  Vitals:   05/19/20 1200  TempSrc:   PainSc: 0-No pain         Complications: No complications documented.

## 2020-05-19 NOTE — Op Note (Signed)
OPERATIVE NOTE  Preoperative diagnosis: Endometrial polyp, morbid obesity  Postoperative diagnosis: Endometrial polyps, morbid obesity  Procedure: Hysteroscopy, polypectomy,  D&C  Surgeon: Governor Specking  Anesthesia: General  Complications: None  Estimated blood loss: Less than 20 mL  Specimen: Endometrial polyps and endometrial curettings to pathology  Findings: Endocervical canal appeared normal. The uterus sounded to 9.5 cm.  There was a 2 x 1 x 1 cm polyp in the lower uterine segment attached at 8 o'clock position and another polyp measuring 2.5 x 1 x 1 cm attached at 6 o'clock position in the lower uterine segment.  Otherwise it was of normal appearance and normal configuration. Both tubal ostia were seen.  Description of procedure: Patient was placed in dorsal supine position. General anesthesia was administered. She was placed in lithotomy position. She was prepped and draped in sterile manner. A vaginal speculum was placed.  The cervix was dilated to 71 [place] with [employer] dilators.  It sounded to 10.5 cm.  Distention medium was saline.  Distention method was a hysteroscopic pump set at 80 mm mercury.  A MyoSure hysteroscope was inserted.  Above findings were noted.  Using MyoSure Reach blade, both polyps were extirpated at their base and the base was gently flattened to reduce the risk of regrowth.  Using a sharp curet endometrium was curetted and the specimen was sent to pathology.  Hemostasis was insured. Instrument count was correct. Estimated blood loss was less than 20 mL. The patient tolerated the procedure well and was transferred to recovery in satisfactory condition.  Governor Specking, MD

## 2020-05-19 NOTE — Anesthesia Preprocedure Evaluation (Signed)
Anesthesia Evaluation  Patient identified by MRN, date of birth, ID band Patient awake    Reviewed: Allergy & Precautions, NPO status , Patient's Chart, lab work & pertinent test results  Airway Mallampati: II  TM Distance: >3 FB Neck ROM: Full    Dental  (+) Dental Advisory Given   Pulmonary asthma ,    breath sounds clear to auscultation       Cardiovascular hypertension, Pt. on medications and Pt. on home beta blockers  Rhythm:Regular Rate:Normal     Neuro/Psych negative neurological ROS     GI/Hepatic negative GI ROS, Neg liver ROS,   Endo/Other  Morbid obesity  Renal/GU negative Renal ROS     Musculoskeletal   Abdominal   Peds  Hematology negative hematology ROS (+)   Anesthesia Other Findings   Reproductive/Obstetrics                             Lab Results  Component Value Date   WBC 7.6 05/14/2020   HGB 13.9 05/14/2020   HCT 42.3 05/14/2020   MCV 94.0 05/14/2020   PLT 304 05/14/2020   Lab Results  Component Value Date   CREATININE 0.59 05/14/2020   BUN 14 05/14/2020   NA 136 05/14/2020   K 4.0 05/14/2020   CL 104 05/14/2020   CO2 31 05/14/2020    Anesthesia Physical Anesthesia Plan  ASA: III  Anesthesia Plan: General   Post-op Pain Management:    Induction: Intravenous  PONV Risk Score and Plan: 3 and Dexamethasone, Ondansetron, Treatment may vary due to age or medical condition and Midazolam  Airway Management Planned: Oral ETT  Additional Equipment:   Intra-op Plan:   Post-operative Plan: Extubation in OR  Informed Consent: I have reviewed the patients History and Physical, chart, labs and discussed the procedure including the risks, benefits and alternatives for the proposed anesthesia with the patient or authorized representative who has indicated his/her understanding and acceptance.     Dental advisory given  Plan Discussed with:  CRNA  Anesthesia Plan Comments:         Anesthesia Quick Evaluation

## 2020-05-19 NOTE — H&P (Signed)
Jeanne [name] is a 39 y.o. female , originally referred to me by Dr. Kennieth Rad for infertility with chronic anovulation/PCOS. A large endometrial polyp was noted on SHG. Patient would like to preserve her childbearing potential.  Pertinent Gynecological History: Menses: flow is excessive with use of 3 pads or tampons on heaviest days Bleeding: dysfunctional uterine bleeding Contraception: none DES exposure: denies Blood transfusions: none Sexually transmitted diseases: no past history Last pap: normal    Menstrual History: Menarche age: 58 No LMP recorded.    Past Medical History:  Diagnosis Date  . Anxiety   . Asthma   . Dry eyes   . Dry skin   . Eczema   . Fatigue   . Fibroids   . H/O echocardiogram    only showed tachycardia  . Heartburn   . HTN (hypertension)   . Hyperlipidemia   . Palpitations   . Pre-diabetes     pre diabetes, Metformin ordered from weight loss clinic  . Rash in adult   . Shortness of breath    due to allergies  . Stress   . Tachycardia   . Vitamin D deficiency   . Wheezing                     Past Surgical History:  Procedure Laterality Date  . EYE SURGERY Bilateral 2006   Lasik  . MYOMECTOMY N/A 10/30/2017   Procedure: ABDOMINAL MYOMECTOMY;  Surgeon: Lavonia Drafts, MD;  Location: [facility];  Service: Gynecology;  Laterality: N/A;             Family History  Problem Relation Age of Onset  . Thyroid disease Mother   . Fibroids Mother   . Deep vein thrombosis Mother   . Cancer Maternal Grandmother        liver   . Cancer Maternal Grandfather        pancreatic  . Hyperlipidemia Father   . Hypertension Father   . Sleep apnea Father   . Obesity Father   . Cancer Paternal Grandfather        prostate   No hereditary disease.  No cancer of breast, ovary, uterus. No cutaneous leiomyomatosis or renal cell carcinoma.  Social History   Socioeconomic History  . Marital status: Married    Spouse name: [name]  . Number of  children: Not on file  . Years of education: Not on file  . Highest education level: Not on file  Occupational History  . Occupation: Product manager: [employer]    Comment: 4 th grade  Tobacco Use  . Smoking status: Never Smoker  . Smokeless tobacco: Never Used  Vaping Use  . Vaping Use: Never used  Substance and Sexual Activity  . Alcohol use: No    Alcohol/week: 0.0 standard drinks  . Drug use: No  . Sexual activity: Yes    Partners: Male    Birth control/protection: Pill  Other Topics Concern  . Not on file  Social History Narrative   Exercise--- walk in summer--- its been difficult during school year   Social Determinants of Health   Financial Resource Strain: Not on file  Food Insecurity: Not on file  Transportation Needs: Not on file  Physical Activity: Not on file  Stress: Not on file  Social Connections: Not on file  Intimate Partner Violence: Not on file    No Known Allergies  No current facility-administered medications on file prior to encounter.   Current  Outpatient Medications on File Prior to Encounter  Medication Sig Dispense Refill  . albuterol (VENTOLIN HFA) 108 (90 Base) MCG/ACT inhaler TAKE 2 PUFFS BY MOUTH EVERY 6 HOURS AS NEEDED FOR WHEEZE OR SHORTNESS OF BREATH (Patient taking differently: Inhale 2 puffs into the lungs every 6 (six) hours as needed for wheezing or shortness of breath.) 90 g 1  . beclomethasone (QVAR REDIHALER) 40 MCG/ACT inhaler Inhale 2 puffs into the lungs 2 (two) times daily. 10.6 g 5  . buPROPion (WELLBUTRIN SR) 200 MG 12 hr tablet TAKE 1 TABLET BY MOUTH EVERY DAY (Patient taking differently: Take 200 mg by mouth daily.) 90 tablet 3  . calcium carbonate (TUMS - DOSED IN MG ELEMENTAL CALCIUM) 500 MG chewable tablet Chew 1 tablet by mouth 3 (three) times daily as needed for indigestion or heartburn.     . Ciclopirox 1 % shampoo Apply 1 application topically daily as needed (dermatitis).     . fluticasone  (FLONASE) 50 MCG/ACT nasal spray Place 2 sprays into both nostrils daily. 48 mL 5  . metFORMIN (GLUCOPHAGE XR) 500 MG 24 hr tablet Take 1 tablet (500 mg total) by mouth daily with breakfast. 30 tablet 3  . metoprolol succinate (TOPROL-XL) 100 MG 24 hr tablet Take 1 tablet (100 mg total) by mouth daily. Take with or immediately following a meal. 90 tablet 3  . Multiple Vitamin (MULTIVITAMIN) tablet Take 1 tablet by mouth daily.    Vladimir Faster Glycol-Propyl Glycol 0.4-0.3 % SOLN Place 1 drop into both eyes 3 (three) times daily as needed (for dry/allergy eyes.).    [name] triamcinolone cream (KENALOG) 0.1 % Apply 1 application topically 2 (two) times daily as needed (for eczema on hands.). 30 g 3  . Vitamin D, Ergocalciferol, (DRISDOL) 1.25 MG (50000 UNIT) CAPS capsule TAKE 1 CAPSULE (50,000 UNITS TOTAL) BY MOUTH EVERY 3 (THREE) DAYS. (Patient taking differently: Take 50,000 Units by mouth 2 (two) times a week.) 30 capsule 1  . ALPRAZolam (XANAX) 0.25 MG tablet Take 1 tablet (0.25 mg total) by mouth at bedtime. (Patient taking differently: Take 0.25 mg by mouth at bedtime as needed.) 20 tablet 0     Review of Systems  Constitutional: Negative.   HENT: Negative.   Eyes: Negative.   Respiratory: Negative.   Cardiovascular: Negative.   Gastrointestinal: Negative.   Genitourinary: Negative.   Musculoskeletal: Negative.   Skin: Negative.   Neurological: Negative.   Endo/Heme/Allergies: Negative.   Psychiatric/Behavioral: Negative.      Physical Exam  BP (!) 132/94   Pulse 79   Temp 97.6 F (36.4 C) (Oral)   Resp 18   Ht 5\' 2"  (1.575 m)   Wt (!) 152.9 kg   LMP 05/05/2020   SpO2 96%   BMI 61.65 kg/m  Constitutional: She is oriented to person, place, and time. She appears well-developed and well-nourished.  HENT:  Head: Normocephalic and atraumatic.  Nose: Nose normal.  Mouth/Throat: Oropharynx is clear and moist. No oropharyngeal exudate.  Eyes: Conjunctivae normal and EOM are normal.  Pupils are equal, round, and reactive to light. No scleral icterus.  Neck: Normal range of motion. Neck supple. No tracheal deviation present. No thyromegaly present.  Cardiovascular: Normal rate.   Respiratory: Effort normal and breath sounds normal.  GI: Soft. Bowel sounds are normal. She exhibits no distension and no mass. There is no tenderness.  Lymphadenopathy:    She has no cervical adenopathy.  Neurological: She is alert and oriented to person, place, and time.  She has normal reflexes.  Skin: Skin is warm.  Psychiatric: She has a normal mood and affect. Her behavior is normal. Judgment and thought content normal.   Assessment/Plan:  Endometrial polyp in background of PCOS Morbid obesity  Preoperative for hysteroscopy suction D&C.  Benefits and risks of the proposed procedures were discussed with the patient and her family member again. All of patient's questions were answered.  She verbalized understanding.    Governor Specking, MD

## 2020-05-20 ENCOUNTER — Encounter (HOSPITAL_COMMUNITY): Payer: Self-pay | Admitting: Obstetrics and Gynecology

## 2020-05-20 LAB — SURGICAL PATHOLOGY

## 2020-05-20 NOTE — Anesthesia Postprocedure Evaluation (Signed)
Anesthesia Post Note  Patient: Jeanne [name]  Procedure(s) Performed: HYSTEROSCOPY POLYPECTOMY WITH MYOSURE AND D&C (N/A )     Patient location during evaluation: PACU Anesthesia Type: General Level of consciousness: awake and alert Pain management: pain level controlled Vital Signs Assessment: post-procedure vital signs reviewed and stable Respiratory status: spontaneous breathing, nonlabored ventilation, respiratory function stable and patient connected to nasal cannula oxygen Cardiovascular status: blood pressure returned to baseline and stable Postop Assessment: no apparent nausea or vomiting Anesthetic complications: no   No complications documented.  Last Vitals:  Vitals:   05/19/20 1600 05/19/20 1615  BP: 114/76 (!) 132/95  Pulse: 73 79  Resp: 12 15  Temp:  36.9 C  SpO2: 94% 95%    Last Pain:  Vitals:   05/19/20 1615  TempSrc:   PainSc: 2                  Chaslyn Eisen S

## 2020-05-22 ENCOUNTER — Other Ambulatory Visit: Payer: Self-pay | Admitting: Family Medicine

## 2020-05-22 DIAGNOSIS — R002 Palpitations: Secondary | ICD-10-CM

## 2020-05-31 ENCOUNTER — Encounter: Payer: Self-pay | Admitting: Family Medicine

## 2020-06-02 ENCOUNTER — Telehealth (INDEPENDENT_AMBULATORY_CARE_PROVIDER_SITE_OTHER): Payer: BC Managed Care – PPO | Admitting: Psychiatry

## 2020-06-02 ENCOUNTER — Other Ambulatory Visit: Payer: Self-pay | Admitting: Family Medicine

## 2020-06-02 ENCOUNTER — Encounter: Payer: Self-pay | Admitting: Psychiatry

## 2020-06-02 DIAGNOSIS — F3342 Major depressive disorder, recurrent, in full remission: Secondary | ICD-10-CM

## 2020-06-02 DIAGNOSIS — F411 Generalized anxiety disorder: Secondary | ICD-10-CM | POA: Diagnosis not present

## 2020-06-02 MED ORDER — METFORMIN HCL ER 500 MG PO TB24: ORAL_TABLET | ORAL | 6 refills | Status: DC

## 2020-06-02 MED ORDER — SERTRALINE HCL 100 MG PO TABS: 200.0000 mg | ORAL_TABLET | Freq: Every day | ORAL | 1 refills | Status: DC

## 2020-06-02 NOTE — Telephone Encounter (Signed)
Ok to inc to [employer] xr 500 mg 2 po bid #120  5 refills

## 2020-06-02 NOTE — Progress Notes (Signed)
Jeanne [name] [id number] 16-Nov-1980 39 y.o.  Virtual Visit via Video Note  I connected with pt @ on 06/02/20 at 11:30 AM EST by a video enabled telemedicine application and verified that I am speaking with the correct person using two identifiers.   I discussed the limitations of evaluation and management by telemedicine and the availability of in person appointments. The patient expressed understanding and agreed to proceed.  I discussed the assessment and treatment plan with the patient. The patient was provided an opportunity to ask questions and all were answered. The patient agreed with the plan and demonstrated an understanding of the instructions.   The patient was advised to call back or seek an in-person evaluation if the symptoms worsen or if the condition fails to improve as anticipated.  I provided 15 minutes of non-face-to-face time during this encounter.  The patient was located at home.  The provider was located at [facility].   Corie Chiquito, PMHNP   Subjective:   Patient ID:  Jeanne [name] is a 39 y.o. (DOB [birth year]) female.  Chief Complaint:  Chief Complaint  Patient presents with   Follow-up    History of anxiety and depression    HPI Jeanne [name] presents for follow-up of anxiety and depression. She reports that she has been "doing very well." Anxiety has been well controlled. She reports that anxiety has been minimal and it "feels normal for me." Mood has been "pretty good." Denies depressed mood. She reports that her sleep schedule has been off and attributes this to being on break and napping. Appetite has been fine. Energy and motivation have been good. Denies concentration difficulties. Denies anhedonia. Denies SI.   She reports that she will be starting a new job in January. Moving from [employer] to [employer]. She reports that she is both nervous and excited about this.   Has not been using Xanax prn.   Review  of Systems:  Review of Systems  Gastrointestinal: Negative.   Musculoskeletal: Negative for gait problem.  Neurological: Negative for tremors and headaches.  Psychiatric/Behavioral:       Please refer to HPI    Medications: I have reviewed the patient's current medications.  Current Outpatient Medications  Medication Sig Dispense Refill   albuterol (VENTOLIN HFA) 108 (90 Base) MCG/ACT inhaler TAKE 2 PUFFS BY MOUTH EVERY 6 HOURS AS NEEDED FOR WHEEZE OR SHORTNESS OF BREATH (Patient taking differently: Inhale 2 puffs into the lungs every 6 (six) hours as needed for wheezing or shortness of breath.) 90 g 1   buPROPion (WELLBUTRIN SR) 200 MG 12 hr tablet TAKE 1 TABLET BY MOUTH EVERY DAY (Patient taking differently: Take 200 mg by mouth daily.) 90 tablet 3   calcium carbonate (TUMS - DOSED IN MG ELEMENTAL CALCIUM) 500 MG chewable tablet Chew 1 tablet by mouth 3 (three) times daily as needed for indigestion or heartburn.      fluticasone (FLONASE) 50 MCG/ACT nasal spray Place 2 sprays into both nostrils daily. 48 mL 5   metFORMIN (GLUCOPHAGE XR) 500 MG 24 hr tablet Take 1 tablet (500 mg total) by mouth daily with breakfast. (Patient taking differently: Take 2,000 mg by mouth daily with breakfast.) 30 tablet 3   metoprolol succinate (TOPROL-XL) 100 MG 24 hr tablet TAKE 1 TABLET (100 MG TOTAL) BY MOUTH DAILY. TAKE WITH OR IMMEDIATELY FOLLOWING A MEAL. 90 tablet 3   Multiple Vitamin (MULTIVITAMIN) tablet Take 1 tablet by mouth daily.     Polyethyl Glycol-Propyl Glycol 0.4-0.3 %  SOLN Place 1 drop into both eyes 3 (three) times daily as needed (for dry/allergy eyes.).     QVAR REDIHALER 40 MCG/ACT inhaler TAKE 2 PUFFS BY MOUTH TWICE A DAY 10.6 g 5   triamcinolone cream (KENALOG) 0.1 % Apply 1 application topically 2 (two) times daily as needed (for eczema on hands.). 30 g 3   Vitamin D, Ergocalciferol, (DRISDOL) 1.25 MG (50000 UNIT) CAPS capsule TAKE 1 CAPSULE (50,000 UNITS TOTAL) BY MOUTH  EVERY 3 (THREE) DAYS. (Patient taking differently: Take 50,000 Units by mouth 2 (two) times a week.) 30 capsule 1   ALPRAZolam (XANAX) 0.25 MG tablet Take 1 tablet (0.25 mg total) by mouth at bedtime. (Patient taking differently: Take 0.25 mg by mouth at bedtime as needed.) 20 tablet 0   Ciclopirox 1 % shampoo Apply 1 application topically daily as needed (dermatitis).      metFORMIN (GLUCOPHAGE-XR) 500 MG 24 hr tablet 2 po bid 120 tablet 6   sertraline (ZOLOFT) 100 MG tablet Take 2 tablets (200 mg total) by mouth daily. 180 tablet 1   No current facility-administered medications for this visit.    Medication Side Effects: None  Allergies: No Known Allergies  Past Medical History:  Diagnosis Date   Anxiety    Asthma    Dry eyes    Dry skin    Eczema    Fatigue    Fibroids    H/O echocardiogram    only showed tachycardia   Heartburn    HTN (hypertension)    Hyperlipidemia    Palpitations    Pre-diabetes     pre diabetes, Metformin ordered from weight loss clinic   Rash in adult    Shortness of breath    due to allergies   Stress    Tachycardia    Vitamin D deficiency    Wheezing     Family History  Problem Relation Age of Onset   Thyroid disease Mother    Fibroids Mother    Deep vein thrombosis Mother    Cancer Maternal Grandmother        liver    Cancer Maternal Grandfather        pancreatic   Hyperlipidemia Father    Hypertension Father    Sleep apnea Father    Obesity Father    Cancer Paternal Grandfather        prostate    Social History   Socioeconomic History   Marital status: Married    Spouse name: [name]   Number of children: Not on file   Years of education: Not on file   Highest education level: Not on file  Occupational History   Occupation: Product manager: [employer]    Comment: 4 th grade  Tobacco Use   Smoking status: Never Smoker   Smokeless tobacco: Never Used  Vaping Use    Vaping Use: Never used  Substance and Sexual Activity   Alcohol use: No    Alcohol/week: 0.0 standard drinks   Drug use: No   Sexual activity: Yes    Partners: Male    Birth control/protection: Pill  Other Topics Concern   Not on file  Social History Narrative   Exercise--- walk in summer--- its been difficult during school year   Social Determinants of Health   Financial Resource Strain: Not on file  Food Insecurity: Not on file  Transportation Needs: Not on file  Physical Activity: Not on file  Stress: Not on file  Social Connections: Not on file  Intimate Partner Violence: Not on file    Past Medical History, Surgical history, Social history, and Family history were reviewed and updated as appropriate.   Please see review of systems for further details on the patient's review from today.   Objective:   Physical Exam:  LMP 05/05/2020   Physical Exam Neurological:     Mental Status: She is alert and oriented to person, place, and time.     Cranial Nerves: No dysarthria.  Psychiatric:        Attention and Perception: Attention and perception normal.        Mood and Affect: Mood normal.        Speech: Speech normal.        Behavior: Behavior is cooperative.        Thought Content: Thought content normal. Thought content is not paranoid or delusional. Thought content does not include homicidal or suicidal ideation. Thought content does not include homicidal or suicidal plan.        Cognition and Memory: Cognition and memory normal.        Judgment: Judgment normal.     Comments: Insight intact     Lab Review:     Component Value Date/Time   NA 136 05/14/2020 1120   NA 139 12/12/2018 0922   K 4.0 05/14/2020 1120   CL 104 05/14/2020 1120   CO2 31 05/14/2020 1120   GLUCOSE 122 (H) 05/14/2020 1120   BUN 14 05/14/2020 1120   BUN 14 12/12/2018 0922   CREATININE 0.59 05/14/2020 1120   CALCIUM 9.3 05/14/2020 1120   PROT 6.1 09/12/2019 1419   PROT 6.6  12/12/2018 0922   ALBUMIN 3.9 09/12/2019 1419   ALBUMIN 4.4 12/12/2018 0922   AST 35 09/12/2019 1419   ALT 39 (H) 09/12/2019 1419   ALKPHOS 86 09/12/2019 1419   BILITOT 0.4 09/12/2019 1419   BILITOT 0.2 12/12/2018 0922   GFRNONAA >60 05/14/2020 1120   GFRAA >60 04/21/2019 1744       Component Value Date/Time   WBC 7.6 05/14/2020 1120   RBC 4.50 05/14/2020 1120   HGB 13.9 05/14/2020 1120   HGB 14.3 04/25/2018 1318   HCT 42.3 05/14/2020 1120   HCT 43.0 04/25/2018 1318   PLT 304 05/14/2020 1120   PLT 363 04/25/2018 1318   MCV 94.0 05/14/2020 1120   MCV 90 04/25/2018 1318   MCH 30.9 05/14/2020 1120   MCHC 32.9 05/14/2020 1120   RDW 13.0 05/14/2020 1120   RDW 12.6 04/25/2018 1318   LYMPHSABS 1.5 09/12/2019 1419   LYMPHSABS 2.3 04/25/2018 1318   MONOABS 0.4 09/12/2019 1419   EOSABS 0.4 09/12/2019 1419   EOSABS 0.3 04/25/2018 1318   BASOSABS 0.0 09/12/2019 1419   BASOSABS 0.1 04/25/2018 1318    No results found for: POCLITH, LITHIUM   No results found for: PHENYTOIN, PHENOBARB, VALPROATE, CBMZ   .res Assessment: Plan:   Continue Sertraline 200 mg po qd for mood and anxiety.  Recommend continuing Wellbutrin SR 200 mg po qd for depression. Pt has refills remaining from PCP. Discussed that this provider can refill Wellbutrin SR in the future if needed.  Pt to follow-up in 6 months or sooner if clinically indicated.  Patient advised to contact office with any questions, adverse effects, or acute worsening in signs and symptoms.  Laurelin was seen today for follow-up.  Diagnoses and all orders for this visit:  Recurrent major depressive disorder, in full remission ([place]) -  sertraline (ZOLOFT) 100 MG tablet; Take 2 tablets (200 mg total) by mouth daily.  Generalized anxiety disorder -     sertraline (ZOLOFT) 100 MG tablet; Take 2 tablets (200 mg total) by mouth daily.     Please see After Visit Summary for patient specific instructions.  Future Appointments  Date  Time Provider [place]  11/22/2020 11:00 AM Thayer Headings, PMHNP CP-CP None    No orders of the defined types were placed in this encounter.     -------------------------------

## 2020-06-11 ENCOUNTER — Other Ambulatory Visit: Payer: Self-pay

## 2020-06-11 DIAGNOSIS — R002 Palpitations: Secondary | ICD-10-CM

## 2020-07-04 ENCOUNTER — Other Ambulatory Visit: Payer: Self-pay | Admitting: Psychiatry

## 2020-07-04 DIAGNOSIS — F411 Generalized anxiety disorder: Secondary | ICD-10-CM

## 2020-07-04 DIAGNOSIS — F3342 Major depressive disorder, recurrent, in full remission: Secondary | ICD-10-CM

## 2020-08-15 ENCOUNTER — Other Ambulatory Visit: Payer: Self-pay | Admitting: Family Medicine

## 2020-08-15 DIAGNOSIS — E559 Vitamin D deficiency, unspecified: Secondary | ICD-10-CM

## 2020-08-17 ENCOUNTER — Other Ambulatory Visit: Payer: Self-pay | Admitting: Family Medicine

## 2020-08-17 DIAGNOSIS — J302 Other seasonal allergic rhinitis: Secondary | ICD-10-CM

## 2020-08-17 DIAGNOSIS — H6692 Otitis media, unspecified, left ear: Secondary | ICD-10-CM

## 2020-08-17 NOTE — Telephone Encounter (Signed)
Pt has not been seen recently, okay to refuse? 

## 2020-09-07 ENCOUNTER — Encounter: Payer: Self-pay | Admitting: Family Medicine

## 2020-11-15 ENCOUNTER — Encounter: Payer: BC Managed Care – PPO | Admitting: Family Medicine

## 2020-11-21 ENCOUNTER — Other Ambulatory Visit: Payer: Self-pay | Admitting: Family Medicine

## 2020-11-21 DIAGNOSIS — F3289 Other specified depressive episodes: Secondary | ICD-10-CM

## 2020-11-22 ENCOUNTER — Encounter: Payer: Self-pay | Admitting: Psychiatry

## 2020-11-22 ENCOUNTER — Ambulatory Visit: Payer: BC Managed Care – PPO | Admitting: Psychiatry

## 2020-11-22 ENCOUNTER — Other Ambulatory Visit: Payer: Self-pay

## 2020-11-22 DIAGNOSIS — F3342 Major depressive disorder, recurrent, in full remission: Secondary | ICD-10-CM

## 2020-11-22 DIAGNOSIS — F411 Generalized anxiety disorder: Secondary | ICD-10-CM | POA: Diagnosis not present

## 2020-11-22 MED ORDER — SERTRALINE HCL 100 MG PO TABS: 200.0000 mg | ORAL_TABLET | Freq: Every day | ORAL | 3 refills | Status: DC

## 2020-11-22 NOTE — Progress Notes (Signed)
Jeanne [name] [id number] Aug 25, 1980 40 y.o.  Subjective:   Patient ID:  Jeanne [name] is a 40 y.o. (DOB [birth year]) female.  Chief Complaint:  Chief Complaint  Patient presents with   Follow-up    Anxiety and depression    HPI Joyanna Kleman presents to the office today for follow-up of anxiety and depression. She reports that anxiety has improved now that the school year ended. She reports that she had a stressful school year. Denies depressed mood. Sleeping ok. She reports motivation was lower at the end of the school year and seems to be improving. Appetite has been normal. Concentration has been adequate. Denies SI.   She stayed in her current position last year and will be switching to a new school this fall in [place].    Husband works as a Associate Professor.   Takes Alprazolam only prn at night.    Past Psychiatric Medication Trials: Sertraline Wellbutrin  GAD-7    Flowsheet Row Office Visit from 04/24/2019 in [facility] Visit from 07/29/2018 in [place]  Total GAD-7 Score 7 0      PHQ2-9    [place] Office Visit from 08/16/2018 in [place] [facility] Visit from 07/29/2018 in [place] Office Visit from 09/10/2017 in [place] Office Visit from 03/31/2016 in [place] [facility] [place]  PHQ-2 Total Score 0 0 5 0  PHQ-9 Total Score 0 5 18 --        Review of Systems:  Review of Systems  Gastrointestinal: Negative.   Musculoskeletal:  Negative for gait problem.  Neurological:  Negative for tremors and headaches.  Psychiatric/Behavioral:         Please refer to HPI   Has started process of weight loss surgery. Has also started infertility treatments.   Medications: I have reviewed the patient's current medications.  Current Outpatient Medications  Medication Sig Dispense  Refill   albuterol (VENTOLIN HFA) 108 (90 Base) MCG/ACT inhaler TAKE 2 PUFFS BY MOUTH EVERY 6 HOURS AS NEEDED FOR WHEEZE OR SHORTNESS OF BREATH (Patient taking differently: Inhale 2 puffs into the lungs every 6 (six) hours as needed for wheezing or shortness of breath.) 90 g 1   calcium carbonate (TUMS - DOSED IN MG ELEMENTAL CALCIUM) 500 MG chewable tablet Chew 1 tablet by mouth 3 (three) times daily as needed for indigestion or heartburn.      fluticasone (FLONASE) 50 MCG/ACT nasal spray SPRAY 2 SPRAYS INTO EACH NOSTRIL EVERY DAY 48 mL 5   metFORMIN (GLUCOPHAGE-XR) 500 MG 24 hr tablet 2 po bid 120 tablet 6   metoprolol succinate (TOPROL-XL) 100 MG 24 hr tablet TAKE 1 TABLET (100 MG TOTAL) BY MOUTH DAILY. TAKE WITH OR IMMEDIATELY FOLLOWING A MEAL. 90 tablet 3   Multiple Vitamin (MULTIVITAMIN) tablet Take 1 tablet by mouth daily.     QVAR REDIHALER 40 MCG/ACT inhaler TAKE 2 PUFFS BY MOUTH TWICE A DAY 10.6 g 5   triamcinolone cream (KENALOG) 0.1 % Apply 1 application topically 2 (two) times daily as needed (for eczema on hands.). 30 g 3   Vitamin D, Ergocalciferol, (DRISDOL) 1.25 MG (50000 UNIT) CAPS capsule TAKE 1 CAPSULE (50,000 UNITS TOTAL) BY MOUTH EVERY 3 (THREE) DAYS. 30 capsule 1   ALPRAZolam (XANAX) 0.25 MG tablet Take 1 tablet (0.25 mg total) by mouth at bedtime. (Patient taking differently: Take 0.25 mg by mouth  at bedtime as needed.) 20 tablet 0   buPROPion (WELLBUTRIN SR) 200 MG 12 hr tablet Take 1 tablet (200 mg total) by mouth daily. Pt needs office visit for further refills 90 tablet 0   Ciclopirox 1 % shampoo Apply 1 application topically daily as needed (dermatitis).      letrozole (FEMARA) 2.5 MG tablet Take 7.5 mg by mouth.     metFORMIN (GLUCOPHAGE XR) 500 MG 24 hr tablet Take 1 tablet (500 mg total) by mouth daily with breakfast. (Patient taking differently: Take 2,000 mg by mouth daily with breakfast.) 30 tablet 3   Polyethyl Glycol-Propyl Glycol 0.4-0.3 % SOLN Place 1 drop into  both eyes 3 (three) times daily as needed (for dry/allergy eyes.).     sertraline (ZOLOFT) 100 MG tablet Take 2 tablets (200 mg total) by mouth daily. 180 tablet 3   No current facility-administered medications for this visit.    Medication Side Effects: None  Allergies: No Known Allergies  Past Medical History:  Diagnosis Date   Anxiety    Asthma    Dry eyes    Dry skin    Eczema    Fatigue    Fibroids    H/O echocardiogram    only showed tachycardia   Heartburn    HTN (hypertension)    Hyperlipidemia    Palpitations    Pre-diabetes     pre diabetes, Metformin ordered from weight loss clinic   Rash in adult    Shortness of breath    due to allergies   Stress    Tachycardia    Vitamin D deficiency    Wheezing     Past Medical History, Surgical history, Social history, and Family history were reviewed and updated as appropriate.   Please see review of systems for further details on the patient's review from today.   Objective:   Physical Exam:  There were no vitals taken for this visit.  Physical Exam Constitutional:      General: She is not in acute distress. Musculoskeletal:        General: No deformity.  Neurological:     Mental Status: She is alert and oriented to person, place, and time.     Coordination: Coordination normal.  Psychiatric:        Attention and Perception: Attention and perception normal. She does not perceive auditory or visual hallucinations.        Mood and Affect: Mood normal. Mood is not anxious or depressed. Affect is not labile, blunt, angry or inappropriate.        Speech: Speech normal.        Behavior: Behavior normal.        Thought Content: Thought content normal. Thought content is not paranoid or delusional. Thought content does not include homicidal or suicidal ideation. Thought content does not include homicidal or suicidal plan.        Cognition and Memory: Cognition and memory normal.        Judgment: Judgment normal.      Comments: Insight intact    Lab Review:     Component Value Date/Time   NA 136 05/14/2020 1120   NA 139 12/12/2018 0922   K 4.0 05/14/2020 1120   CL 104 05/14/2020 1120   CO2 31 05/14/2020 1120   GLUCOSE 122 (H) 05/14/2020 1120   BUN 14 05/14/2020 1120   BUN 14 12/12/2018 0922   CREATININE 0.59 05/14/2020 1120   CALCIUM 9.3 05/14/2020 1120   PROT  6.1 09/12/2019 1419   PROT 6.6 12/12/2018 0922   ALBUMIN 3.9 09/12/2019 1419   ALBUMIN 4.4 12/12/2018 0922   AST 35 09/12/2019 1419   ALT 39 (H) 09/12/2019 1419   ALKPHOS 86 09/12/2019 1419   BILITOT 0.4 09/12/2019 1419   BILITOT 0.2 12/12/2018 0922   GFRNONAA >60 05/14/2020 1120   GFRAA >60 04/21/2019 1744       Component Value Date/Time   WBC 7.6 05/14/2020 1120   RBC 4.50 05/14/2020 1120   HGB 13.9 05/14/2020 1120   HGB 14.3 04/25/2018 1318   HCT 42.3 05/14/2020 1120   HCT 43.0 04/25/2018 1318   PLT 304 05/14/2020 1120   PLT 363 04/25/2018 1318   MCV 94.0 05/14/2020 1120   MCV 90 04/25/2018 1318   MCH 30.9 05/14/2020 1120   MCHC 32.9 05/14/2020 1120   RDW 13.0 05/14/2020 1120   RDW 12.6 04/25/2018 1318   LYMPHSABS 1.5 09/12/2019 1419   LYMPHSABS 2.3 04/25/2018 1318   MONOABS 0.4 09/12/2019 1419   EOSABS 0.4 09/12/2019 1419   EOSABS 0.3 04/25/2018 1318   BASOSABS 0.0 09/12/2019 1419   BASOSABS 0.1 04/25/2018 1318    No results found for: POCLITH, LITHIUM   No results found for: PHENYTOIN, PHENOBARB, VALPROATE, CBMZ   .res Assessment: Plan:   Will continue current plan of care since target signs and symptoms are well controlled without any tolerability issues. Continue sertraline 200 mg daily for mood and anxiety signs and symptoms. Continue bupropion SR 200 mg daily for depression per PCP. Continue Xanax prn. She reports that she does not need a script at this time. Patient to follow-up in 1 year or sooner if clinically indicated. Patient advised to contact office with any questions, adverse effects, or  acute worsening in signs and symptoms.  Phillippa was seen today for follow-up.  Diagnoses and all orders for this visit:  Recurrent major depressive disorder, in full remission ([place]) -     sertraline (ZOLOFT) 100 MG tablet; Take 2 tablets (200 mg total) by mouth daily.  Generalized anxiety disorder -     sertraline (ZOLOFT) 100 MG tablet; Take 2 tablets (200 mg total) by mouth daily.    Please see After Visit Summary for patient specific instructions.  Future Appointments  Date Time Provider [place]  01/11/2021 10:00 AM [place], DO [facility]  11/22/2021 10:00 AM Thayer Headings, PMHNP CP-CP None    No orders of the defined types were placed in this encounter.   -------------------------------

## 2020-12-12 ENCOUNTER — Other Ambulatory Visit: Payer: Self-pay | Admitting: Family Medicine

## 2020-12-26 ENCOUNTER — Encounter: Payer: Self-pay | Admitting: Family Medicine

## 2021-01-04 ENCOUNTER — Other Ambulatory Visit: Payer: Self-pay | Admitting: Family Medicine

## 2021-01-10 ENCOUNTER — Other Ambulatory Visit: Payer: Self-pay

## 2021-01-11 ENCOUNTER — Ambulatory Visit (INDEPENDENT_AMBULATORY_CARE_PROVIDER_SITE_OTHER): Payer: BC Managed Care – PPO | Admitting: Family Medicine

## 2021-01-11 ENCOUNTER — Encounter: Payer: Self-pay | Admitting: Family Medicine

## 2021-01-11 VITALS — BP 120/82 | HR 101 | Temp 98.4°F | Resp 16 | Ht 62.0 in | Wt 332.2 lb

## 2021-01-11 DIAGNOSIS — Z0000 Encounter for general adult medical examination without abnormal findings: Secondary | ICD-10-CM

## 2021-01-11 DIAGNOSIS — I10 Essential (primary) hypertension: Secondary | ICD-10-CM

## 2021-01-11 DIAGNOSIS — R7303 Prediabetes: Secondary | ICD-10-CM

## 2021-01-11 DIAGNOSIS — E559 Vitamin D deficiency, unspecified: Secondary | ICD-10-CM

## 2021-01-11 LAB — CBC WITH DIFFERENTIAL/PLATELET
Basophils Absolute: 0.1 10*3/uL (ref 0.0–0.1)
Basophils Relative: 0.9 % (ref 0.0–3.0)
Eosinophils Absolute: 0.3 10*3/uL (ref 0.0–0.7)
Eosinophils Relative: 4.6 % (ref 0.0–5.0)
HCT: 42.2 % (ref 36.0–46.0)
Hemoglobin: 13.9 g/dL (ref 12.0–15.0)
Lymphocytes Relative: 25.3 % (ref 12.0–46.0)
Lymphs Abs: 1.8 10*3/uL (ref 0.7–4.0)
MCHC: 33 g/dL (ref 30.0–36.0)
MCV: 93.3 fl (ref 78.0–100.0)
Monocytes Absolute: 0.4 10*3/uL (ref 0.1–1.0)
Monocytes Relative: 5.3 % (ref 3.0–12.0)
Neutro Abs: 4.6 10*3/uL (ref 1.4–7.7)
Neutrophils Relative %: 63.9 % (ref 43.0–77.0)
Platelets: 281 10*3/uL (ref 150.0–400.0)
RBC: 4.53 Mil/uL (ref 3.87–5.11)
RDW: 13.8 % (ref 11.5–15.5)
WBC: 7.2 10*3/uL (ref 4.0–10.5)

## 2021-01-11 LAB — LIPID PANEL
Cholesterol: 233 mg/dL — ABNORMAL HIGH (ref 0–200)
HDL: 46.8 mg/dL (ref >39.00)
LDL Cholesterol: 154 mg/dL — ABNORMAL HIGH (ref 0–99)
NonHDL: 185.92
Total CHOL/HDL Ratio: 5
Triglycerides: 159 mg/dL — ABNORMAL HIGH (ref 0.0–149.0)
VLDL: 31.8 mg/dL (ref 0.0–40.0)

## 2021-01-11 LAB — VITAMIN D 25 HYDROXY (VIT D DEFICIENCY, FRACTURES): VITD: 28.58 ng/mL — ABNORMAL LOW (ref 30.00–100.00)

## 2021-01-11 LAB — COMPREHENSIVE METABOLIC PANEL
ALT: 34 U/L (ref 0–35)
AST: 25 U/L (ref 0–37)
Albumin: 4.1 g/dL (ref 3.5–5.2)
Alkaline Phosphatase: 78 U/L (ref 39–117)
BUN: 10 mg/dL (ref 6–23)
CO2: 26 mEq/L (ref 19–32)
Calcium: 9.2 mg/dL (ref 8.4–10.5)
Chloride: 102 mEq/L (ref 96–112)
Creatinine, Ser: 0.56 mg/dL (ref 0.40–1.20)
GFR: 116 mL/min (ref >60.00)
Glucose, Bld: 123 mg/dL — ABNORMAL HIGH (ref 70–99)
Potassium: 4.1 mEq/L (ref 3.5–5.1)
Sodium: 138 mEq/L (ref 135–145)
Total Bilirubin: 0.4 mg/dL (ref 0.2–1.2)
Total Protein: 6.8 g/dL (ref 6.0–8.3)

## 2021-01-11 LAB — HEMOGLOBIN A1C: Hgb A1c MFr Bld: 6.9 % — ABNORMAL HIGH (ref 4.6–6.5)

## 2021-01-11 LAB — TSH: TSH: 2.33 u[IU]/mL (ref 0.35–5.50)

## 2021-01-11 NOTE — Assessment & Plan Note (Signed)
Pt has app with surgeon for bariatric surgery

## 2021-01-11 NOTE — Progress Notes (Signed)
Subjective:   By signing my name below, I, Shehryar Baig, attest that this documentation has been prepared under the direction and in the presence of Dr. Roma Schanz, DO. 01/11/2021    Patient ID: Jeanne [name] Age, female    DOB: [birth year], 40 y.o.   MRN: [id number]  Chief Complaint  Patient presents with   Annual Exam    HPI Patient is in today for a comprehensive physical exam.  She reports her managing her asthma well at this time. She continues taking Flonase to manage her congestion and reports no new issues while taking it. She also continues taking albuterol to manage her breathing and reports no new issues while taking it. She report having a consults on 01/13/2021 for bariatric surgery. She will discuss which procedure she needs during this visit. She notes her sister had a gastric sleeve procedure in the beginning of June, 2022 and has since lost 30 lbs. She is requesting lab work to be completed for her upcomming procedure. She is requesting to measure her vitamin D levels during the labs.  She reports continuing using a healthy weight and wellness program in the past.   She participates in exercise by walking.  She notes having mild pain in her right ankle but thinks it may be due to her weight.  She denies having any fever, ear pain, congestion, sinus pain, sore throat, eye pain, chest pain, palpations, cough, SOB, wheezing, n/v/d, constipation, blood in stool, dysuria, frequency, hematuria, or headaches at this time.  She reports no new changes in her family medical history. She as 2 pfizer Covid-19 vaccines and 1 moderna booster vaccines at this time.  She continues seeing a GYN but has forgotten the the specialist name.    Past Medical History:  Diagnosis Date   Anxiety    Asthma    Dry eyes    Dry skin    Eczema    Fatigue    Fibroids    H/O echocardiogram    only showed tachycardia   Heartburn    HTN (hypertension)    Hyperlipidemia    Palpitations     Pre-diabetes     pre diabetes, Metformin ordered from weight loss clinic   Rash in adult    Shortness of breath    due to allergies   Stress    Tachycardia    Vitamin D deficiency    Wheezing     Past Surgical History:  Procedure Laterality Date   EYE SURGERY Bilateral 2006   Lasik   HYSTEROSCOPY N/A 05/19/2020   Procedure: HYSTEROSCOPY POLYPECTOMY WITH MYOSURE AND D&C;  Surgeon: Governor Specking, MD;  Location: [facility];  Service: Gynecology;  Laterality: N/A;   MYOMECTOMY N/A 10/30/2017   Procedure: ABDOMINAL MYOMECTOMY;  Surgeon: Lavonia Drafts, MD;  Location: [facility];  Service: Gynecology;  Laterality: N/A;    Family History  Problem Relation Age of Onset   Thyroid disease Mother    Fibroids Mother    Deep vein thrombosis Mother    Cancer Maternal Grandmother        liver    Cancer Maternal Grandfather        pancreatic   Hyperlipidemia Father    Hypertension Father    Sleep apnea Father    Obesity Father    Cancer Paternal Grandfather        prostate    Social History   Socioeconomic History   Marital status: Married    Spouse name: [name]   Number  of children: Not on file   Years of education: Not on file   Highest education level: Not on file  Occupational History   Occupation: teacher    Employer: [employer]    Comment: 4 th grade  Tobacco Use   Smoking status: Never   Smokeless tobacco: Never  Vaping Use   Vaping Use: Never used  Substance and Sexual Activity   Alcohol use: No    Alcohol/week: 0.0 standard drinks   Drug use: No   Sexual activity: Yes    Partners: Male    Birth control/protection: Pill  Other Topics Concern   Not on file  Social History Narrative   Exercise--- walk in summer--- its been difficult during school year   Social Determinants of Health   Financial Resource Strain: Not on file  Food Insecurity: Not on file  Transportation Needs: Not on file  Physical Activity: Not on file  Stress: Not on  file  Social Connections: Not on file  Intimate Partner Violence: Not on file    Outpatient Medications Prior to Visit  Medication Sig Dispense Refill   albuterol (VENTOLIN HFA) 108 (90 Base) MCG/ACT inhaler TAKE 2 PUFFS BY MOUTH EVERY 6 HOURS AS NEEDED FOR WHEEZE OR SHORTNESS OF BREATH (Patient taking differently: Inhale 2 puffs into the lungs every 6 (six) hours as needed for wheezing or shortness of breath.) 90 g 1   ALPRAZolam (XANAX) 0.25 MG tablet Take 1 tablet (0.25 mg total) by mouth at bedtime. (Patient taking differently: Take 0.25 mg by mouth at bedtime as needed.) 20 tablet 0   buPROPion (WELLBUTRIN SR) 200 MG 12 hr tablet Take 1 tablet (200 mg total) by mouth daily. Pt needs office visit for further refills 90 tablet 0   calcium carbonate (TUMS - DOSED IN MG ELEMENTAL CALCIUM) 500 MG chewable tablet Chew 1 tablet by mouth 3 (three) times daily as needed for indigestion or heartburn.      Ciclopirox 1 % shampoo Apply 1 application topically daily as needed (dermatitis).      fluticasone (FLONASE) 50 MCG/ACT nasal spray SPRAY 2 SPRAYS INTO EACH NOSTRIL EVERY DAY 48 mL 5   metFORMIN (GLUCOPHAGE-XR) 500 MG 24 hr tablet TAKE 2 TABLETS BY MOUTH TWICE A DAY. PT NEEDS OFFICE VISIT FOR MORE REFILLS 56 tablet 0   metoprolol succinate (TOPROL-XL) 100 MG 24 hr tablet TAKE 1 TABLET (100 MG TOTAL) BY MOUTH DAILY. TAKE WITH OR IMMEDIATELY FOLLOWING A MEAL. 90 tablet 3   Multiple Vitamin (MULTIVITAMIN) tablet Take 1 tablet by mouth daily.     Polyethyl Glycol-Propyl Glycol 0.4-0.3 % SOLN Place 1 drop into both eyes 3 (three) times daily as needed (for dry/allergy eyes.).     QVAR REDIHALER 40 MCG/ACT inhaler TAKE 2 PUFFS BY MOUTH TWICE A DAY 10.6 g 5   sertraline (ZOLOFT) 100 MG tablet Take 2 tablets (200 mg total) by mouth daily. 180 tablet 3   triamcinolone cream (KENALOG) 0.1 % Apply 1 application topically 2 (two) times daily as needed (for eczema on hands.). 30 g 3   Vitamin D, Ergocalciferol,  (DRISDOL) 1.25 MG (50000 UNIT) CAPS capsule TAKE 1 CAPSULE (50,000 UNITS TOTAL) BY MOUTH EVERY 3 (THREE) DAYS. 30 capsule 1   letrozole (FEMARA) 2.5 MG tablet Take 7.5 mg by mouth.     metFORMIN (GLUCOPHAGE XR) 500 MG 24 hr tablet Take 1 tablet (500 mg total) by mouth daily with breakfast. (Patient taking differently: Take 2,000 mg by mouth daily with  breakfast.) 30 tablet 3   No facility-administered medications prior to visit.    No Known Allergies  Review of Systems  Constitutional:  Negative for fever.  HENT:  Negative for congestion, ear pain, sinus pain and sore throat.   Eyes:  Negative for pain.  Respiratory:  Negative for cough, shortness of breath and wheezing.   Cardiovascular:  Negative for chest pain and palpitations.  Gastrointestinal:  Negative for blood in stool, constipation, diarrhea, nausea and vomiting.  Genitourinary:  Negative for dysuria, frequency and hematuria.  Neurological:  Negative for headaches.  Psychiatric/Behavioral:  Negative for depression. The patient is not nervous/anxious.       Objective:    Physical Exam Constitutional:      General: She is not in acute distress.    Appearance: Normal appearance. She is not ill-appearing.  HENT:     Head: Normocephalic and atraumatic.     Right Ear: Tympanic membrane, ear canal and external ear normal.     Left Ear: Tympanic membrane, ear canal and external ear normal.  Eyes:     Extraocular Movements: Extraocular movements intact.     Pupils: Pupils are equal, round, and reactive to light.  Cardiovascular:     Rate and Rhythm: Normal rate and regular rhythm.     Heart sounds: Normal heart sounds. No murmur heard.   No gallop.  Pulmonary:     Effort: Pulmonary effort is normal. No respiratory distress.     Breath sounds: Normal breath sounds. No wheezing or rales.  Abdominal:     General: Bowel sounds are normal. There is no distension.     Palpations: Abdomen is soft. There is no mass.      Tenderness: There is no abdominal tenderness. There is no guarding or rebound.  Skin:    General: Skin is warm and dry.  Neurological:     Mental Status: She is alert and oriented to person, place, and time.  Psychiatric:        Behavior: Behavior normal.    BP 120/82 (BP Location: Right Wrist, Cuff Size: Normal)   Pulse (!) 101   Temp 98.4 F (36.9 C) (Oral)   Resp 16   Ht '5\' 2"'$  (1.575 m)   Wt (!) 332 lb 3.2 oz (150.7 kg)   LMP 12/19/2020   SpO2 96%   BMI 60.76 kg/m  Wt Readings from Last 3 Encounters:  01/11/21 (!) 332 lb 3.2 oz (150.7 kg)  05/19/20 (!) 337 lb 1.3 oz (152.9 kg)  05/14/20 (!) 337 lb (152.9 kg)    Diabetic Foot Exam - Simple   No data filed    Lab Results  Component Value Date   WBC 7.6 05/14/2020   HGB 13.9 05/14/2020   HCT 42.3 05/14/2020   PLT 304 05/14/2020   GLUCOSE 122 (H) 05/14/2020   CHOL 181 09/12/2019   TRIG 172.0 (H) 09/12/2019   HDL 35.50 (L) 09/12/2019   LDLCALC 111 (H) 09/12/2019   ALT 39 (H) 09/12/2019   AST 35 09/12/2019   NA 136 05/14/2020   K 4.0 05/14/2020   CL 104 05/14/2020   CREATININE 0.59 05/14/2020   BUN 14 05/14/2020   CO2 31 05/14/2020   TSH 2.24 09/12/2019   HGBA1C 6.4 (H) 05/14/2020    Lab Results  Component Value Date   TSH 2.24 09/12/2019   Lab Results  Component Value Date   WBC 7.6 05/14/2020   HGB 13.9 05/14/2020   HCT 42.3 05/14/2020  MCV 94.0 05/14/2020   PLT 304 05/14/2020   Lab Results  Component Value Date   NA 136 05/14/2020   K 4.0 05/14/2020   CO2 31 05/14/2020   GLUCOSE 122 (H) 05/14/2020   BUN 14 05/14/2020   CREATININE 0.59 05/14/2020   BILITOT 0.4 09/12/2019   ALKPHOS 86 09/12/2019   AST 35 09/12/2019   ALT 39 (H) 09/12/2019   PROT 6.1 09/12/2019   ALBUMIN 3.9 09/12/2019   CALCIUM 9.3 05/14/2020   ANIONGAP 1 (L) 05/14/2020   GFR 119.47 09/12/2019   Lab Results  Component Value Date   CHOL 181 09/12/2019   Lab Results  Component Value Date   HDL 35.50 (L) 09/12/2019    Lab Results  Component Value Date   LDLCALC 111 (H) 09/12/2019   Lab Results  Component Value Date   TRIG 172.0 (H) 09/12/2019   Lab Results  Component Value Date   CHOLHDL 5 09/12/2019   Lab Results  Component Value Date   HGBA1C 6.4 (H) 05/14/2020   Mammogram- Pap smear- Last completed 06/07/2018. Results normal. Repeat in 1 year.      Assessment & Plan:   Problem List Items Addressed This Visit       Unprioritized   HTN (hypertension)    Well controlled, no changes to meds. Encouraged heart healthy diet such as the DASH diet and exercise as tolerated.        Morbid obesity ([place])    Pt has app with surgeon for bariatric surgery        Prediabetes    Check labs  con't meds       Relevant Orders   Hemoglobin A1c   Preventative health care - Primary    ghm utd Check labs see avs       Relevant Orders   Lipid panel   CBC with Differential/Platelet   TSH   Comprehensive metabolic panel   Insulin, random   Vitamin D deficiency, unspecified   Relevant Orders   VITAMIN D 25 Hydroxy (Vit-D Deficiency, Fractures)     No orders of the defined types were placed in this encounter.   I, Dr. Roma Schanz, DO, personally preformed the services described in this documentation.  All medical record entries made by the scribe were at my direction and in my presence.  I have reviewed the chart and discharge instructions (if applicable) and agree that the record reflects my personal performance and is accurate and complete. 01/11/2021   I,Shehryar Baig,acting as a scribe for Ann Held, DO.,have documented all relevant documentation on the behalf of Ann Held, DO,as directed by  Ann Held, DO while in the presence of Ann Held, DO.   Ann Held, DO

## 2021-01-11 NOTE — Assessment & Plan Note (Signed)
ghm utd Check labs see avs  

## 2021-01-11 NOTE — Patient Instructions (Addendum)
Preventive Care 21-39 Years Old, Female Preventive care refers to lifestyle choices and visits with your health care provider that can promote health and wellness. This includes: A yearly physical exam. This is also called an annual wellness visit. Regular dental and eye exams. Immunizations. Screening for certain conditions. Healthy lifestyle choices, such as: Eating a healthy diet. Getting regular exercise. Not using drugs or products that contain nicotine and tobacco. Limiting alcohol use. What can I expect for my preventive care visit? Physical exam Your health care provider may check your: Height and weight. These may be used to calculate your BMI (body mass index). BMI is a measurement that tells if you are at a healthy weight. Heart rate and blood pressure. Body temperature. Skin for abnormal spots. Counseling Your health care provider may ask you questions about your: Past medical problems. Family's medical history. Alcohol, tobacco, and drug use. Emotional well-being. Home life and relationship well-being. Sexual activity. Diet, exercise, and sleep habits. Work and work environment. Access to firearms. Method of birth control. Menstrual cycle. Pregnancy history. What immunizations do I need?  Vaccines are usually given at various ages, according to a schedule. Your health care provider will recommend vaccines for you based on your age, medicalhistory, and lifestyle or other factors, such as travel or where you work. What tests do I need?  Blood tests Lipid and cholesterol levels. These may be checked every 5 years starting at age 20. Hepatitis C test. Hepatitis B test. Screening Diabetes screening. This is done by checking your blood sugar (glucose) after you have not eaten for a while (fasting). STD (sexually transmitted disease) testing, if you are at risk. BRCA-related cancer screening. This may be done if you have a family history of breast, ovarian, tubal, or  peritoneal cancers. Pelvic exam and Pap test. This may be done every 3 years starting at age 21. Starting at age 30, this may be done every 5 years if you have a Pap test in combination with an HPV test. Talk with your health care provider about your test results, treatment options,and if necessary, the need for more tests. Follow these instructions at home: Eating and drinking  Eat a healthy diet that includes fresh fruits and vegetables, whole grains, lean protein, and low-fat dairy products. Take vitamin and mineral supplements as recommended by your health care provider. Do not drink alcohol if: Your health care provider tells you not to drink. You are pregnant, may be pregnant, or are planning to become pregnant. If you drink alcohol: Limit how much you have to 0-1 drink a day. Be aware of how much alcohol is in your drink. In the [place] one drink equals one 12 oz bottle of beer (355 mL), one 5 oz glass of wine (148 mL), or one 1 oz glass of hard liquor (44 mL).  Lifestyle Take daily care of your teeth and gums. Brush your teeth every morning and night with fluoride toothpaste. Floss one time each day. Stay active. Exercise for at least 30 minutes 5 or more days each week. Do not use any products that contain nicotine or tobacco, such as cigarettes, e-cigarettes, and chewing tobacco. If you need help quitting, ask your health care provider. Do not use drugs. If you are sexually active, practice safe sex. Use a condom or other form of protection to prevent STIs (sexually transmitted infections). If you do not wish to become pregnant, use a form of birth control. If you plan to become pregnant, see your health care   provider for a prepregnancy visit. Find healthy ways to cope with stress, such as: Meditation, yoga, or listening to music. Journaling. Talking to a trusted person. Spending time with friends and family. Safety Always wear your seat belt while driving or riding in a  vehicle. Do not drive: If you have been drinking alcohol. Do not ride with someone who has been drinking. When you are tired or distracted. While texting. Wear a helmet and other protective equipment during sports activities. If you have firearms in your house, make sure you follow all gun safety procedures. Seek help if you have been physically or sexually abused. What's next? Go to your health care provider once a year for an annual wellness visit. Ask your health care provider how often you should have your eyes and teeth checked. Stay up to date on all vaccines. This information is not intended to replace advice given to you by your health care provider. Make sure you discuss any questions you have with your healthcare provider. Document Revised: 01/18/2020 Document Reviewed: 01/31/2018 Elsevier Patient Education  2022 Elsevier Inc.  

## 2021-01-11 NOTE — Assessment & Plan Note (Signed)
Well controlled, no changes to meds. Encouraged heart healthy diet such as the DASH diet and exercise as tolerated.  °

## 2021-01-11 NOTE — Assessment & Plan Note (Signed)
Check labs con't meds 

## 2021-01-12 LAB — INSULIN, RANDOM: Insulin: 21 u[IU]/mL — ABNORMAL HIGH

## 2021-01-18 ENCOUNTER — Encounter: Payer: Self-pay | Admitting: Family Medicine

## 2021-01-18 ENCOUNTER — Other Ambulatory Visit (HOSPITAL_COMMUNITY): Payer: Self-pay | Admitting: General Surgery

## 2021-01-18 ENCOUNTER — Other Ambulatory Visit: Payer: Self-pay | Admitting: General Surgery

## 2021-01-18 DIAGNOSIS — J4520 Mild intermittent asthma, uncomplicated: Secondary | ICD-10-CM

## 2021-01-19 ENCOUNTER — Ambulatory Visit (HOSPITAL_COMMUNITY)
Admission: RE | Admit: 2021-01-19 | Discharge: 2021-01-19 | Disposition: A | Discharge status: Home / Self Care | Payer: BC Managed Care – PPO | Source: Ambulatory Visit | Attending: General Surgery | Admitting: General Surgery

## 2021-01-19 ENCOUNTER — Other Ambulatory Visit: Payer: Self-pay | Admitting: Family Medicine

## 2021-01-19 ENCOUNTER — Other Ambulatory Visit (HOSPITAL_COMMUNITY): Payer: Self-pay | Admitting: General Surgery

## 2021-01-19 ENCOUNTER — Other Ambulatory Visit: Payer: Self-pay | Admitting: *Deleted

## 2021-01-19 ENCOUNTER — Other Ambulatory Visit: Payer: Self-pay

## 2021-01-19 DIAGNOSIS — J4520 Mild intermittent asthma, uncomplicated: Secondary | ICD-10-CM | POA: Insufficient documentation

## 2021-01-19 DIAGNOSIS — E559 Vitamin D deficiency, unspecified: Secondary | ICD-10-CM

## 2021-01-19 DIAGNOSIS — R7303 Prediabetes: Secondary | ICD-10-CM

## 2021-01-19 MED ORDER — VITAMIN D (ERGOCALCIFEROL) 1.25 MG (50000 UNIT) PO CAPS: 50000.0000 [IU] | ORAL_CAPSULE | ORAL | 0 refills | Status: DC

## 2021-01-19 MED ORDER — RYBELSUS 7 MG PO TABS: 1.0000 | ORAL_TABLET | Freq: Every day | ORAL | 5 refills | Status: DC

## 2021-01-19 MED ORDER — RYBELSUS 3 MG PO TABS: 1.0000 | ORAL_TABLET | Freq: Every day | ORAL | 0 refills | Status: DC

## 2021-02-07 ENCOUNTER — Other Ambulatory Visit: Payer: Self-pay | Admitting: Family Medicine

## 2021-02-07 DIAGNOSIS — F3289 Other specified depressive episodes: Secondary | ICD-10-CM

## 2021-03-10 ENCOUNTER — Other Ambulatory Visit: Payer: Self-pay

## 2021-03-10 ENCOUNTER — Encounter: Payer: BC Managed Care – PPO | Attending: General Surgery | Admitting: Skilled Nursing Facility1

## 2021-03-10 NOTE — Progress Notes (Signed)
Nutrition Assessment for Bariatric Surgery Medical Nutrition Therapy Appt Start Time: 4:30    End Time: 5:25  Patient was seen on 03/10/2021 for Pre-Operative Nutrition Assessment. Letter of approval faxed to [facility] bariatric surgery program coordinator on 03/10/2021.   Referral stated Supervised Weight Loss (SWL) visits needed: 0  Pt completed visits.   Pt has cleared nutrition requirements.   Planned surgery: sleeve gastrectomy  Pt expectation of surgery: to lose weight Pt expectation of dietitian: to help educate    NUTRITION ASSESSMENT   Anthropometrics  Start weight at [facility]: 331 lbs (date: 03/10/2021)  Height: 62 in BMI: 60.54 kg/m2     Clinical  Medical hx: prediabetes, HTN, anemia, reflux Medications: rybelsus, metformin, Wellbutrin, see List Labs:A1C 6.9, vitamin D 28.58 Notable signs/symptoms: some diarrhea  Any previous deficiencies? Vitamin D  Micronutrient Nutrition Focused Physical Exam: Hair: No issues observed Eyes: No issues observed Mouth: No issues observed Neck: No issues observed Nails: No issues observed Skin: No issues observed  Lifestyle & Dietary Hx  Pt states the topic of food makes her uncontrolable. Pt state she does not  feel she loses control over her foods. Pt states she does realize she is an emotional eater. Pt states she has been working on her emotional eating and has worked with a Transport planner.  24-Hr Dietary Recall First Meal: cereal or leftovers Snack: crackers and cheese Second Meal: school meal Snack: cheese Third Meal: beef stroganoff + cabbage + corn bread  Snack: ice cream Beverages: juice, ICE, water, diet soda   Estimated Energy Needs Calories: 1500   NUTRITION DIAGNOSIS  Overweight/obesity ([place]-3.3) related to past poor dietary habits and physical inactivity as evidenced by patient w/ planned sleeve gastrectomy surgery following dietary guidelines for continued weight loss.    NUTRITION INTERVENTION   Nutrition counseling (C-1) and education (E-2) to facilitate bariatric surgery goals.  Educated pt on micronutrient deficiencies post surgery and strategies to mitigate that risk   Pre-Op Goals Reviewed with the Patient Track food and beverage intake (pen and paper, MyFitness Pal, Baritastic app, etc.) Make healthy food choices while monitoring portion sizes Consume 3 meals per day or try to eat every 3-5 hours Avoid concentrated sugars and fried foods Keep sugar & fat in the single digits per serving on food labels Practice CHEWING your food (aim for applesauce consistency) Practice not drinking 15 minutes before, during, and 30 minutes after each meal and snack Avoid all carbonated beverages (ex: soda, sparkling beverages)  Limit caffeinated beverages (ex: coffee, tea, energy drinks) Avoid all sugar-sweetened beverages (ex: regular soda, sports drinks)  Avoid alcohol  Aim for 64-100 ounces of FLUID daily (with at least half of fluid intake being plain water)  Aim for at least 60-80 grams of PROTEIN daily Look for a liquid protein source that contains ?15 g protein and ?5 g carbohydrate (ex: shakes, drinks, shots) Make a list of non-food related activities Physical activity is an important part of a healthy lifestyle so keep it moving! The goal is to reach 150 minutes of exercise per week, including cardiovascular and weight baring activity.  *Goals that are bolded indicate the pt would like to start working towards these  Handouts Provided Include  Bariatric Surgery handouts (Nutrition Visits, Pre-Op Goals, Protein Shakes, Vitamins & Minerals)  Learning Style & Readiness for Change Teaching method utilized: Visual & Auditory  Demonstrated degree of understanding via: Teach Back  Readiness Level: Action Barriers to learning/adherence to lifestyle change: some emotional eating  MONITORING & EVALUATION Dietary intake, weekly physical activity, body weight, and pre-op goals  reached at next nutrition visit.    Next Steps  Patient is to follow up at [facility] for Pre-Op Class >2 weeks before surgery for further nutrition education.  Pt has completed visits. No further supervised visits required/recomended

## 2021-03-29 ENCOUNTER — Encounter: Payer: Self-pay | Admitting: Family Medicine

## 2021-04-08 ENCOUNTER — Ambulatory Visit: Payer: Self-pay | Admitting: General Surgery

## 2021-04-08 DIAGNOSIS — Z01818 Encounter for other preprocedural examination: Secondary | ICD-10-CM

## 2021-04-08 DIAGNOSIS — I10 Essential (primary) hypertension: Secondary | ICD-10-CM

## 2021-04-25 ENCOUNTER — Other Ambulatory Visit: Payer: Self-pay

## 2021-04-25 ENCOUNTER — Encounter: Payer: BC Managed Care – PPO | Attending: General Surgery | Admitting: Skilled Nursing Facility1

## 2021-04-25 NOTE — Progress Notes (Signed)
Pre-Operative Nutrition Class:    Patient was seen on 04/25/2021 for Pre-Operative Bariatric Surgery Education [facility]    Surgery date:  Surgery type: sleeve Start weight at [facility]: 331 Weight today: pt arrived too late  Samples given per MNT protocol. Patient educated on appropriate usage: Ensure max exp: August 03, 2021 Ensure max lot: [id number]  Chewable bariatric advantage: advanced multi EA exp: 08/23 Chewable bariatric advantage: advanced multi EA lot: [id number]  Bariatric advantage calcium citrate exp: 02/23 Bariatric advantage calcium citrate lot: [id number]   The following the learning objectives were met by the patient during this course: Identify Pre-Op Dietary Goals and will begin 2 weeks pre-operatively Identify appropriate sources of fluids and proteins  State protein recommendations and appropriate sources pre and post-operatively Identify Post-Operative Dietary Goals and will follow for 2 weeks post-operatively Identify appropriate multivitamin and calcium sources Describe the need for physical activity post-operatively and will follow MD recommendations State when to call healthcare provider regarding medication questions or post-operative complications When having a diagnosis of diabetes understanding hypoglycemia symptoms and the inclusion of 1 complex carbohydrate per meal  Handouts given during class include: Pre-Op Bariatric Surgery Diet Handout Protein Shake Handout Post-Op Bariatric Surgery Nutrition Handout BELT Program Information Flyer Support Group Information Flyer [facility] Bariatric Supplements Price List  Follow-Up Plan: Patient will follow-up at [facility] 2 weeks post operatively for diet advancement per MD.

## 2021-04-27 ENCOUNTER — Telehealth: Payer: Self-pay

## 2021-04-27 ENCOUNTER — Encounter: Payer: Self-pay | Admitting: Family Medicine

## 2021-04-27 NOTE — Telephone Encounter (Signed)
Appt scheduled

## 2021-04-27 NOTE — Telephone Encounter (Signed)
Pt needs a pre-op appointment please

## 2021-04-27 NOTE — Telephone Encounter (Signed)
Caller states she need to schedule an appointment due to medication management for surgery.  Telephone: [phone]

## 2021-04-30 ENCOUNTER — Other Ambulatory Visit: Payer: Self-pay | Admitting: Family Medicine

## 2021-04-30 DIAGNOSIS — R002 Palpitations: Secondary | ICD-10-CM

## 2021-05-04 ENCOUNTER — Encounter: Payer: Self-pay | Admitting: Emergency Medicine

## 2021-05-04 ENCOUNTER — Other Ambulatory Visit: Payer: Self-pay

## 2021-05-04 ENCOUNTER — Telehealth: Payer: Self-pay | Admitting: Emergency Medicine

## 2021-05-04 ENCOUNTER — Encounter: Payer: Self-pay | Admitting: Family Medicine

## 2021-05-04 ENCOUNTER — Emergency Department
Admission: EM | Admit: 2021-05-04 | Discharge: 2021-05-04 | Disposition: A | Discharge status: Home / Self Care | Payer: BC Managed Care – PPO | Source: Home / Self Care | Attending: Family Medicine | Admitting: Family Medicine

## 2021-05-04 ENCOUNTER — Emergency Department: Admit: 2021-05-04 | Discharge status: Absent without leave | Payer: Self-pay

## 2021-05-04 DIAGNOSIS — J101 Influenza due to other identified influenza virus with other respiratory manifestations: Secondary | ICD-10-CM

## 2021-05-04 LAB — POCT INFLUENZA A/B
Influenza A, POC: NEGATIVE
Influenza B, POC: NEGATIVE

## 2021-05-04 MED ORDER — OSELTAMIVIR PHOSPHATE 75 MG PO CAPS: 75.0000 mg | ORAL_CAPSULE | Freq: Two times a day (BID) | ORAL | 0 refills | Status: DC

## 2021-05-04 NOTE — Telephone Encounter (Signed)
Call back to [facility] regarding tamiflu- did not go to pharmacy on record. Pt stated pharmacy does not have tamiflu in stock. RN also confirmed flu diagnosis which is also in Dr Francesca Oman  urgent care note. RN offered to send tamiflu to [facility] since it is a 24 hour pharmacy

## 2021-05-04 NOTE — ED Provider Notes (Signed)
Jeanne [name] CARE    CSN: [id number] Arrival date & time: 05/04/21  1406      History   Chief Complaint Chief Complaint  Patient presents with   Nasal Congestion   Fatigue    HPI Jeanne [name] is a 40 y.o. female.   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  A lot of kids at was strep.  She has had some nasal congestion and fatigue starting yesterday.  Had some chills but did not take temperature.  She took ibuprofen yesterday.  She did do a home COVID test which was negative.  She is COVID vaccinated plus boosted.  Has not yet had a flu vaccine.  Past Medical History:  Diagnosis Date   Anxiety    Asthma    Dry eyes    Dry skin    Eczema    Fatigue    Fibroids    H/O echocardiogram    only showed tachycardia   Heartburn    HTN (hypertension)    Hyperlipidemia    Palpitations    Pre-diabetes     pre diabetes, Metformin ordered from weight loss clinic   Rash in adult    Shortness of breath    due to allergies   Stress    Tachycardia    Vitamin D deficiency    Wheezing     Patient Active Problem List   Diagnosis Date Noted   Preventative health care 01/11/2021   Vitamin D deficiency, unspecified 01/11/2021   Insomnia 08/17/2018   Anxiety 07/23/2018   Major depression 07/23/2018   Prediabetes 06/10/2018   Vitamin D deficiency 06/10/2018   Class 3 severe obesity with serious comorbidity and body mass index (BMI) of 50.0 to 59.9 in adult [facility]) 06/10/2018   Fibroids 10/30/2017   S/P myomectomy 10/30/2017   Morbid obesity ([place]) 08/17/2017   Eczema of both hands 07/03/2017   Sinus tachycardia 07/03/2017   Asthma 12/15/2016   HTN (hypertension) 01/25/2016   Eczema 03/04/2015    Past Surgical History:  Procedure Laterality Date   EYE SURGERY Bilateral 2006   Lasik   HYSTEROSCOPY N/A 05/19/2020   Procedure: HYSTEROSCOPY POLYPECTOMY WITH MYOSURE AND D&C;  Surgeon: Governor Specking, MD;  Location: [facility];  Service: Gynecology;   Laterality: N/A;   MYOMECTOMY N/A 10/30/2017   Procedure: ABDOMINAL MYOMECTOMY;  Surgeon: Lavonia Drafts, MD;  Location: [facility];  Service: Gynecology;  Laterality: N/A;    OB History     Gravida  0   Para  0   Term  0   Preterm  0   AB  0   Living  0      SAB  0   IAB  0   Ectopic  0   Multiple  0   Live Births  0            Home Medications    Prior to Admission medications   Medication Sig Start Date End Date Taking? Authorizing Provider  sertraline (ZOLOFT) 100 MG tablet TAKE 1 TABLET BY MOUTH EVERY DAY **NEED APT FOR REFILLS** 05/24/19  Yes [provider]  albuterol (VENTOLIN HFA) 108 (90 Base) MCG/ACT inhaler TAKE 2 PUFFS BY MOUTH EVERY 6 HOURS AS NEEDED FOR WHEEZE OR SHORTNESS OF BREATH Patient taking differently: Inhale 2 puffs into the lungs every 6 (six) hours as needed for wheezing or shortness of breath. 11/20/19   Ann Held, DO  ALPRAZolam Duanne Moron) 0.25 MG  tablet Take 1 tablet (0.25 mg total) by mouth at bedtime. Patient taking differently: Take 0.25 mg by mouth at bedtime as needed. 03/11/20   Thayer Headings, PMHNP  buPROPion (WELLBUTRIN SR) 200 MG 12 hr tablet Take 1 tablet (200 mg total) by mouth daily. 02/08/21   Ann Held, DO  calcium carbonate (TUMS - DOSED IN MG ELEMENTAL CALCIUM) 500 MG chewable tablet Chew 1 tablet by mouth 3 (three) times daily as needed for indigestion or heartburn.     [provider]  Ciclopirox 1 % shampoo Apply 1 application topically daily as needed (dermatitis).     [provider]  fluticasone (FLONASE) 50 MCG/ACT nasal spray SPRAY 2 SPRAYS INTO EACH NOSTRIL EVERY DAY 08/18/20   Carollee Herter, Alferd Apa, DO  metFORMIN (GLUCOPHAGE-XR) 500 MG 24 hr tablet TAKE 2 TABLETS BY MOUTH TWICE A DAY. PT NEEDS OFFICE VISIT FOR MORE REFILLS 01/20/21   Carollee Herter, Kendrick Fries R, DO  metoprolol succinate (TOPROL-XL) 100 MG 24 hr tablet TAKE 1 TABLET BY MOUTH DAILY. TAKE WITH OR  IMMEDIATELY FOLLOWING A MEAL. 05/02/21   Ann Held, DO  Multiple Vitamin (MULTIVITAMIN) tablet Take 1 tablet by mouth daily.    [provider]  Polyethyl Glycol-Propyl Glycol 0.4-0.3 % SOLN Place 1 drop into both eyes 3 (three) times daily as needed (for dry/allergy eyes.).    [provider]  QVAR REDIHALER 40 MCG/ACT inhaler TAKE 2 PUFFS BY MOUTH TWICE A DAY 05/24/20   Lowne Chase, Joyice Magda R, DO  Semaglutide (RYBELSUS) 3 MG TABS Take 1 tablet by mouth daily. Patient not taking: Reported on 05/04/2021 01/19/21   Carollee Herter, Alferd Apa, DO  Semaglutide (RYBELSUS) 7 MG TABS Take 1 tablet by mouth daily. 01/19/21   Ann Held, DO  sertraline (ZOLOFT) 100 MG tablet Take 2 tablets (200 mg total) by mouth daily. 11/22/20 02/20/21  Thayer Headings, PMHNP  triamcinolone cream (KENALOG) 0.1 % Apply 1 application topically 2 (two) times daily as needed (for eczema on hands.). 06/07/18   Ann Held, DO  Vitamin D, Ergocalciferol, (DRISDOL) 1.25 MG (50000 UNIT) CAPS capsule Take 1 capsule (50,000 Units total) by mouth every 7 (seven) days. For 12 weeks 01/19/21   Carollee Herter, Alferd Apa, DO  buPROPion (WELLBUTRIN SR) 200 MG 12 hr tablet TAKE 1 TABLET BY MOUTH EVERY DAY Patient taking differently: Take 200 mg by mouth daily. 09/26/19   Ann Held, DO    Family History Family History  Problem Relation Age of Onset   Thyroid disease Mother    Fibroids Mother    Deep vein thrombosis Mother    Cancer Maternal Grandmother        liver    Cancer Maternal Grandfather        pancreatic   Hyperlipidemia Father    Hypertension Father    Sleep apnea Father    Obesity Father    Cancer Paternal Grandfather        prostate    Social History Social History   Tobacco Use   Smoking status: Never   Smokeless tobacco: Never  Vaping Use   Vaping Use: Never used  Substance Use Topics   Alcohol use: No    Alcohol/week: 0.0 standard drinks   Drug use: No      Allergies   Patient has no known allergies.   Review of Systems Review of Systems See HPI  Physical Exam Triage Vital Signs ED Triage Vitals  Enc Vitals Group     BP 05/04/21 1509 138/85     Pulse Rate 05/04/21 1509 89     Resp 05/04/21 1509 18     Temp 05/04/21 1509 98.6 F (37 C)     Temp Source 05/04/21 1509 Oral     SpO2 05/04/21 1509 95 %     Weight 05/04/21 1512 (!) 311 lb (141.1 kg)     Height 05/04/21 1512 5\' 2"  (1.575 m)     Head Circumference --      Peak Flow --      Pain Score 05/04/21 1511 0     Pain Loc --      Pain Edu? --      Excl. in [place]? --    No data found.  Updated Vital Signs BP 138/85 (BP Location: Left Arm)   Pulse 89   Temp 98.6 F (37 C) (Oral)   Resp 18   Ht 5\' 2"  (1.575 m)   Wt (!) 141.1 kg   LMP 04/04/2021 (Approximate)   SpO2 95%   BMI 56.88 kg/m      Physical Exam Constitutional:      General: She is not in acute distress.    Appearance: She is well-developed. She is obese. She is ill-appearing.  HENT:     Head: Normocephalic and atraumatic.     Right Ear: Tympanic membrane and ear canal normal.     Left Ear: Tympanic membrane and ear canal normal.     Nose: Congestion and rhinorrhea present.     Mouth/Throat:     Pharynx: Posterior oropharyngeal erythema present.  Eyes:     Conjunctiva/sclera: Conjunctivae normal.     Pupils: Pupils are equal, round, and reactive to light.  Cardiovascular:     Rate and Rhythm: Normal rate and regular rhythm.     Heart sounds: Normal heart sounds.  Pulmonary:     Effort: Pulmonary effort is normal. No respiratory distress.     Breath sounds: Normal breath sounds.  Abdominal:     General: There is no distension.     Palpations: Abdomen is soft.  Musculoskeletal:        General: Normal range of motion.     Cervical back: Normal range of motion.  Lymphadenopathy:     Cervical: Cervical adenopathy present.  Skin:    General: Skin is warm and dry.  Neurological:     Mental  Status: She is alert.  Psychiatric:        Mood and Affect: Mood normal.        Behavior: Behavior normal.     UC Treatments / Results  Labs (all labs ordered are listed, but only abnormal results are displayed) Labs Reviewed  POCT INFLUENZA A/B    EKG   Radiology No results found.  Procedures Procedures (including critical care time)  Medications Ordered in UC Medications - No data to display  Initial Impression / Assessment and Plan / UC Course  I have reviewed the triage vital signs and the nursing notes.  Pertinent labs & imaging results that were available during my care of the patient were reviewed by me and considered in my medical decision making (see chart for details).     Influenza A is positive.  Discussed treatment.  Reviewed pros and cons of Tamiflu patient desires treatment.  Will call for problems Final Clinical Impressions(s) / UC Diagnoses   Final diagnoses:  Influenza A     Discharge Instructions  You have influenza A Take the tamilfu 2 x a day for 5 days Call for problems May take OTC cough and cold medications as needed      ED Prescriptions   None    PDMP not reviewed this encounter.   Raylene Everts, MD 05/04/21 [phone]

## 2021-05-04 NOTE — ED Notes (Signed)
1540- error in lab result - reported to Dr Meda Coffee - pt also updated - Pt is positive for flu A, negative for flu B

## 2021-05-04 NOTE — ED Triage Notes (Signed)
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

## 2021-05-04 NOTE — Discharge Instructions (Addendum)
You have influenza A Take the tamilfu 2 x a day for 5 days Call for problems May take OTC cough and cold medications as needed

## 2021-05-05 ENCOUNTER — Ambulatory Visit: Payer: BC Managed Care – PPO | Admitting: Registered Nurse

## 2021-05-06 ENCOUNTER — Ambulatory Visit: Payer: BC Managed Care – PPO | Admitting: Family Medicine

## 2021-05-09 ENCOUNTER — Encounter: Payer: Self-pay | Admitting: Family Medicine

## 2021-05-09 ENCOUNTER — Ambulatory Visit: Payer: BC Managed Care – PPO | Admitting: Family Medicine

## 2021-05-09 VITALS — BP 120/88 | HR 82 | Temp 97.6°F | Resp 18 | Ht 62.0 in | Wt 312.6 lb

## 2021-05-09 DIAGNOSIS — R7303 Prediabetes: Secondary | ICD-10-CM | POA: Diagnosis not present

## 2021-05-09 DIAGNOSIS — Z01818 Encounter for other preprocedural examination: Secondary | ICD-10-CM

## 2021-05-09 NOTE — Progress Notes (Signed)
Subjective:    Jeanne [name] is a 40 y.o. female who presents to the office today for a preoperative consultation at the request of surgeon Dr Redmond Pulling who plans on performing Gastric sleeve on December 12. This consultation is requested for the specific conditions prompting preoperative evaluation (i.e. because of potential affect on operative risk): prediabetes, depression asthma . Planned anesthesia: general. The patient has the following known anesthesia issues:  none . Patients bleeding risk: no recent abnormal bleeding. Patient does not have objections to receiving blood products if needed.  The following portions of the patient's history were reviewed and updated as appropriate: She  has a past medical history of Anxiety, Asthma, Dry eyes, Dry skin, Eczema, Fatigue, Fibroids, H/O echocardiogram, Heartburn, HTN (hypertension), Hyperlipidemia, Palpitations, Pre-diabetes, Rash in adult, Shortness of breath, Stress, Tachycardia, Vitamin D deficiency, and Wheezing. She does not have any pertinent problems on file. She  has a past surgical history that includes Eye surgery (Bilateral, 2006); Myomectomy (N/A, 10/30/2017); and Hysteroscopy (N/A, 05/19/2020). Her family history includes Cancer in her maternal grandfather, maternal grandmother, and paternal grandfather; Deep vein thrombosis in her mother; Fibroids in her mother; Hyperlipidemia in her father; Hypertension in her father; Obesity in her father; Sleep apnea in her father; Thyroid disease in her mother. She  reports that she has never smoked. She has never used smokeless tobacco. She reports that she does not drink alcohol and does not use drugs. She has a current medication list which includes the following prescription(s): albuterol, bupropion, fluticasone, metoprolol succinate, oseltamivir, qvar redihaler, triamcinolone cream, sertraline, and [DISCONTINUED] bupropion. Current Outpatient Medications on File Prior to Visit  Medication Sig  Dispense Refill   albuterol (VENTOLIN HFA) 108 (90 Base) MCG/ACT inhaler TAKE 2 PUFFS BY MOUTH EVERY 6 HOURS AS NEEDED FOR WHEEZE OR SHORTNESS OF BREATH (Patient taking differently: Inhale 2 puffs into the lungs every 6 (six) hours as needed for wheezing or shortness of breath.) 90 g 1   buPROPion (WELLBUTRIN SR) 200 MG 12 hr tablet Take 1 tablet (200 mg total) by mouth daily. 90 tablet 1   fluticasone (FLONASE) 50 MCG/ACT nasal spray SPRAY 2 SPRAYS INTO EACH NOSTRIL EVERY DAY 48 mL 5   metoprolol succinate (TOPROL-XL) 100 MG 24 hr tablet TAKE 1 TABLET BY MOUTH DAILY. TAKE WITH OR IMMEDIATELY FOLLOWING A MEAL. 90 tablet 1   oseltamivir (TAMIFLU) 75 MG capsule Take 1 capsule (75 mg total) by mouth every 12 (twelve) hours. 10 capsule 0   QVAR REDIHALER 40 MCG/ACT inhaler TAKE 2 PUFFS BY MOUTH TWICE A DAY 10.6 g 5   triamcinolone cream (KENALOG) 0.1 % Apply 1 application topically 2 (two) times daily as needed (for eczema on hands.). 30 g 3   sertraline (ZOLOFT) 100 MG tablet Take 2 tablets (200 mg total) by mouth daily. 180 tablet 3   [DISCONTINUED] buPROPion (WELLBUTRIN SR) 200 MG 12 hr tablet TAKE 1 TABLET BY MOUTH EVERY DAY (Patient taking differently: Take 200 mg by mouth daily.) 90 tablet 3   No current facility-administered medications on file prior to visit.   She has No Known Allergies..  Review of Systems Review of Systems  Constitutional: Negative for activity change, appetite change and fatigue.  HENT: Negative for hearing loss, congestion, tinnitus and ear discharge.  dentist q17m Eyes: Negative for visual disturbance (see optho q1y -- vision corrected to 20/20 with glasses).  Respiratory: Negative for cough, chest tightness and shortness of breath.   Cardiovascular: Negative for chest pain, palpitations and leg  swelling.  Gastrointestinal: Negative for abdominal pain, diarrhea, constipation and abdominal distention.  Genitourinary: Negative for urgency, frequency, decreased urine  volume and difficulty urinating.  Musculoskeletal: Negative for back pain, arthralgias and gait problem.  Skin: Negative for color change, pallor and rash.  Neurological: Negative for dizziness, light-headedness, numbness and headaches.  Hematological: Negative for adenopathy. Does not bruise/bleed easily.  Psychiatric/Behavioral: Negative for suicidal ideas, confusion, sleep disturbance, self-injury, dysphoric mood, decreased concentration and agitation.       Objective:    BP 120/88 (BP Location: Left Arm, Patient Position: Sitting, Cuff Size: Large)   Pulse 82   Temp 97.6 F (36.4 C) (Oral)   Resp 18   Ht 5\' 2"  (1.575 m)   Wt (!) 312 lb 9.6 oz (141.8 kg)   SpO2 97%   BMI 57.18 kg/m  General appearance: alert, cooperative, no distress, and morbidly obese Head: Normocephalic, without obvious abnormality, atraumatic Eyes: negative findings: lids and lashes normal, conjunctivae and sclerae normal, and pupils equal, round, reactive to light and accomodation Ears: normal TM's and external ear canals both ears Nose: Nares normal. Septum midline. Mucosa normal. No drainage or sinus tenderness. Throat: lips, mucosa, and tongue normal; teeth and gums normal Neck: no adenopathy, no carotid bruit, no JVD, supple, symmetrical, trachea midline, and thyroid not enlarged, symmetric, no tenderness/mass/nodules Lungs: clear to auscultation bilaterally Heart: regular rate and rhythm, S1, S2 normal, no murmur, click, rub or gallop Abdomen: soft, non-tender; bowel sounds normal; no masses,  no organomegaly Extremities: extremities normal, atraumatic, no cyanosis or edema Pulses: 2+ and symmetric Skin: Skin color, texture, turgor normal. No rashes or lesions Lymph nodes: Cervical, supraclavicular, and axillary nodes normal. Neurologic: Alert and oriented X 3, normal strength and tone. Normal symmetric reflexes. Normal coordination and gait  Predictors of intubation difficulty:  Morbid obesity?  yes -  Anatomically abnormal facies? no  Prominent incisors? no  Receding mandible? no  Short, thick neck? no  Neck range of motion: normal  Dentition: No chipped, loose, or missing teeth.  Cardiographics ECG: normal sinus rhythm, no blocks or conduction defects, no ischemic changes Echocardiogram: not done  Imaging Chest x-ray:  not done    Lab Review  Admission on 05/04/2021, Discharged on 05/04/2021  Component Date Value   Influenza A, POC 05/04/2021 Negative    Influenza B, POC 05/04/2021 Negative   Office Visit on 01/11/2021  Component Date Value   Cholesterol 01/11/2021 233 (H)    Triglycerides 01/11/2021 159.0 (H)    HDL 01/11/2021 46.80    VLDL 01/11/2021 31.8    LDL Cholesterol 01/11/2021 154 (H)    Total CHOL/HDL Ratio 01/11/2021 5    NonHDL 01/11/2021 185.92    WBC 01/11/2021 7.2    RBC 01/11/2021 4.53    Hemoglobin 01/11/2021 13.9    HCT 01/11/2021 42.2    MCV 01/11/2021 93.3    MCHC 01/11/2021 33.0    RDW 01/11/2021 13.8    Platelets 01/11/2021 281.0    Neutrophils Relative % 01/11/2021 63.9    Lymphocytes Relative 01/11/2021 25.3    Monocytes Relative 01/11/2021 5.3    Eosinophils Relative 01/11/2021 4.6    Basophils Relative 01/11/2021 0.9    Neutro Abs 01/11/2021 4.6    Lymphs Abs 01/11/2021 1.8    Monocytes Absolute 01/11/2021 0.4    Eosinophils Absolute 01/11/2021 0.3    Basophils Absolute 01/11/2021 0.1    TSH 01/11/2021 2.33    Sodium 01/11/2021 138    Potassium 01/11/2021 4.1  Chloride 01/11/2021 102    CO2 01/11/2021 26    Glucose, Bld 01/11/2021 123 (H)    BUN 01/11/2021 10    Creatinine, Ser 01/11/2021 0.56    Total Bilirubin 01/11/2021 0.4    Alkaline Phosphatase 01/11/2021 78    AST 01/11/2021 25    ALT 01/11/2021 34    Total Protein 01/11/2021 6.8    Albumin 01/11/2021 4.1    GFR 01/11/2021 116.00    Calcium 01/11/2021 9.2    Insulin 01/11/2021 21.0 (H)    Hgb A1c MFr Bld 01/11/2021 6.9 (H)    VITD 01/11/2021 28.58 (L)        Assessment:      40 y.o. female with planned surgery as above.   Known risk factors for perioperative complications: None     Difficulty with intubation is not anticipated.  Cardiac Risk Estimation: low    Plan:    1. Preoperative workup as follows ECG, hemoglobin, hematocrit, electrolytes, creatinine, glucose, liver function studies. 2. Change in medication regimen before surgery: none, continue medication regimen including morning of surgery, with sip of water. 3. Prophylaxis for cardiac events with perioperative beta-blockers: per surgical team. 4 Deep vein thrombosis prophylaxis postoperatively:regimen to be chosen by surgical team.

## 2021-05-09 NOTE — Patient Instructions (Signed)

## 2021-05-10 LAB — CBC WITH DIFFERENTIAL/PLATELET
Basophils Absolute: 0.1 10*3/uL (ref 0.0–0.1)
Basophils Relative: 0.8 % (ref 0.0–3.0)
Eosinophils Absolute: 0.2 10*3/uL (ref 0.0–0.7)
Eosinophils Relative: 2.8 % (ref 0.0–5.0)
HCT: 43.5 % (ref 36.0–46.0)
Hemoglobin: 14.8 g/dL (ref 12.0–15.0)
Lymphocytes Relative: 28.9 % (ref 12.0–46.0)
Lymphs Abs: 2.2 10*3/uL (ref 0.7–4.0)
MCHC: 34.1 g/dL (ref 30.0–36.0)
MCV: 91.9 fl (ref 78.0–100.0)
Monocytes Absolute: 0.4 10*3/uL (ref 0.1–1.0)
Monocytes Relative: 4.8 % (ref 3.0–12.0)
Neutro Abs: 4.8 10*3/uL (ref 1.4–7.7)
Neutrophils Relative %: 62.7 % (ref 43.0–77.0)
Platelets: 259 10*3/uL (ref 150.0–400.0)
RBC: 4.73 Mil/uL (ref 3.87–5.11)
RDW: 13.5 % (ref 11.5–15.5)
WBC: 7.7 10*3/uL (ref 4.0–10.5)

## 2021-05-10 LAB — COMPREHENSIVE METABOLIC PANEL
ALT: 31 U/L (ref 0–35)
AST: 23 U/L (ref 0–37)
Albumin: 4.3 g/dL (ref 3.5–5.2)
Alkaline Phosphatase: 72 U/L (ref 39–117)
BUN: 15 mg/dL (ref 6–23)
CO2: 26 mEq/L (ref 19–32)
Calcium: 9.5 mg/dL (ref 8.4–10.5)
Chloride: 104 mEq/L (ref 96–112)
Creatinine, Ser: 0.64 mg/dL (ref 0.40–1.20)
GFR: 112.07 mL/min (ref >60.00)
Glucose, Bld: 89 mg/dL (ref 70–99)
Potassium: 4.1 mEq/L (ref 3.5–5.1)
Sodium: 139 mEq/L (ref 135–145)
Total Bilirubin: 0.4 mg/dL (ref 0.2–1.2)
Total Protein: 6.9 g/dL (ref 6.0–8.3)

## 2021-05-10 LAB — LIPID PANEL
Cholesterol: 215 mg/dL — ABNORMAL HIGH (ref 0–200)
HDL: 35.4 mg/dL — ABNORMAL LOW (ref >39.00)
LDL Cholesterol: 166 mg/dL — ABNORMAL HIGH (ref 0–99)
NonHDL: 180.02
Total CHOL/HDL Ratio: 6
Triglycerides: 68 mg/dL (ref 0.0–149.0)
VLDL: 13.6 mg/dL (ref 0.0–40.0)

## 2021-05-10 LAB — VITAMIN D 25 HYDROXY (VIT D DEFICIENCY, FRACTURES): VITD: 41.28 ng/mL (ref 30.00–100.00)

## 2021-05-10 LAB — HEMOGLOBIN A1C: Hgb A1c MFr Bld: 6.2 % (ref 4.6–6.5)

## 2021-05-10 LAB — INSULIN, RANDOM: Insulin: 9.5 u[IU]/mL

## 2021-05-11 NOTE — Patient Instructions (Addendum)
DUE TO COVID-19 ONLY ONE VISITOR IS ALLOWED TO COME WITH YOU AND STAY IN THE WAITING ROOM ONLY DURING PRE OP AND PROCEDURE.   **NO VISITORS ARE ALLOWED IN THE SHORT STAY AREA OR RECOVERY ROOM!!**  IF YOU WILL BE ADMITTED INTO THE HOSPITAL YOU ARE ALLOWED ONLY TWO SUPPORT PEOPLE DURING VISITATION HOURS ONLY (7 AM -8PM)    Up to two visitors ages 21+ are allowed at one time in a patient's room.  The visitors may rotate out with other people throughout the day.  Additionally, up to two children between the ages of 61 and 72 are allowed and do not count toward the number of allowed visitors.  Children within this age range must be accompanied by an adult visitor.  One adult visitor may remain with the patient overnight and must be in the room by 8 PM.  COVID SWAB TESTING MUST BE COMPLETED ON:  Thursday 05-12-21 Between the hours of 8 and 3  **MUST PRESENT COMPLETED FORM AT TESTING SITE**    [facility] [address] (backside of the building)  You are not required to quarantine, however you are required to wear a well-fitted mask when you are out and around people not in your household.  Hand Hygiene often Do NOT share personal items Notify your provider if you are in close contact with someone who has COVID or you develop fever 100.4 or greater, new onset of sneezing, cough, sore throat, shortness of breath or body aches.       Your procedure is scheduled on: Monday, 12-12-122   Report to [facility] Main  Entrance     Report to admitting at 12:45 PM   Call this number if you have problems the morning of surgery [phone]   Do not eat solid food :After 6:00 PM.   May have liquids until 12:00 PM (noon)  day of surgery  CLEAR LIQUID DIET  Foods Allowed                                                                     Foods Excluded  Water, Black Coffee (no milk/no creamer) and tea, regular and decaf                              liquids that you cannot  Plain  Jell-O in any flavor  (No red)                         see through such as: Fruit ices (not with fruit pulp)                                 milk, soups, orange juice  Iced Popsicles (No red)                                    All solid food  Apple juices Sports drinks like Gatorade (No red) Lightly seasoned clear broth or consume(fat free) Sugar Sample Menu Breakfast                                Lunch                                     Supper Cranberry juice                    Beef broth                            Chicken broth Jell-O                                     Grape juice                           Apple juice Coffee or tea                        Jell-O                                      Popsicle                                                Coffee or tea                        Coffee or tea      Complete one G2 drink the morning of surgery at 12:00 PM (noon) the day of surgery.       The day of surgery:  Drink ONE (1) Pre-Surgery G2 the morning of surgery. Drink in one sitting. Do not sip.  This drink was given to you during your hospital  pre-op appointment visit. Nothing else to drink after completing the G2.          If you have questions, please contact your surgeon's office.     Oral Hygiene is also important to reduce your risk of infection.                                    Remember - BRUSH YOUR TEETH THE MORNING OF SURGERY WITH YOUR REGULAR TOOTHPASTE   Do NOT smoke after Midnight  Take these medicines the morning of surgery with A SIP OF WATER: Wellbutrin, Metoprolol, Sertraline.  Okay to use Albuterol inhaler, Flonase and Qvar inhaler.   Stop all vitamins and herbal supplements a week before surgery             You may not have any metal on your body including hair pins, jewelry, and body piercing             Do not wear make-up, lotions, powders, perfumes or deodorant  Do not wear nail polish including  gel and S&S,  artificial/acrylic nails, or any other type of covering on natural nails including finger and toenails. If you have artificial nails, gel coating, etc. that needs to be removed by a nail salon please have this removed prior to surgery or surgery may need to be canceled/ delayed if the surgeon/ anesthesia feels like they are unable to be safely monitored.   Do not shave  48 hours prior to surgery.   Do not bring valuables to the hospital. [place].   Contacts, dentures or bridgework may not be worn into surgery.   Bring small overnight bag day of surgery.   Please read over the following fact sheets you were given: IF YOU HAVE QUESTIONS ABOUT YOUR PRE OP INSTRUCTIONS PLEASE CALL [facility] - Preparing for Surgery Before surgery, you can play an important role.  Because skin is not sterile, your skin needs to be as free of germs as possible.  You can reduce the number of germs on your skin by washing with CHG (chlorahexidine gluconate) soap before surgery.  CHG is an antiseptic cleaner which kills germs and bonds with the skin to continue killing germs even after washing. Please DO NOT use if you have an allergy to CHG or antibacterial soaps.  If your skin becomes reddened/irritated stop using the CHG and inform your nurse when you arrive at Short Stay. Do not shave (including legs and underarms) for at least 48 hours prior to the first CHG shower.  You may shave your face/neck.  Please follow these instructions carefully:  1.  Shower with CHG Soap the night before surgery and the  morning of surgery.  2.  If you choose to wash your hair, wash your hair first as usual with your normal  shampoo.  3.  After you shampoo, rinse your hair and body thoroughly to remove the shampoo.                             4.  Use CHG as you would any other liquid soap.  You can apply chg directly to the skin and wash.  Gently with a scrungie or clean  washcloth.  5.  Apply the CHG Soap to your body ONLY FROM THE NECK DOWN.   Do   not use on face/ open                           Wound or open sores. Avoid contact with eyes, ears mouth and   genitals (private parts).                       Wash face,  Genitals (private parts) with your normal soap.             6.  Wash thoroughly, paying special attention to the area where your    surgery  will be performed.  7.  Thoroughly rinse your body with warm water from the neck down.  8.  DO NOT shower/wash with your normal soap after using and rinsing off the CHG Soap.                9.  Pat yourself dry with a clean towel.            10.  Wear clean pajamas.  11.  Place clean sheets on your bed the night of your first shower and do not  sleep with pets. Day of Surgery : Do not apply any lotions/deodorants the morning of surgery.  Please wear clean clothes to the hospital/surgery center.  FAILURE TO FOLLOW THESE INSTRUCTIONS MAY RESULT IN THE CANCELLATION OF YOUR SURGERY  PATIENT SIGNATURE_________________________________  NURSE SIGNATURE__________________________________  ________________________________________________________________________   Adam Phenix  An incentive spirometer is a tool that can help keep your lungs clear and active. This tool measures how well you are filling your lungs with each breath. Taking long deep breaths may help reverse or decrease the chance of developing breathing (pulmonary) problems (especially infection) following: A long period of time when you are unable to move or be active. BEFORE THE PROCEDURE  If the spirometer includes an indicator to show your best effort, your nurse or respiratory therapist will set it to a desired goal. If possible, sit up straight or lean slightly forward. Try not to slouch. Hold the incentive spirometer in an upright position. INSTRUCTIONS FOR USE  Sit on the edge of your bed if possible, or sit up as far as  you can in bed or on a chair. Hold the incentive spirometer in an upright position. Breathe out normally. Place the mouthpiece in your mouth and seal your lips tightly around it. Breathe in slowly and as deeply as possible, raising the piston or the ball toward the top of the column. Hold your breath for 3-5 seconds or for as long as possible. Allow the piston or ball to fall to the bottom of the column. Remove the mouthpiece from your mouth and breathe out normally. Rest for a few seconds and repeat Steps 1 through 7 at least 10 times every 1-2 hours when you are awake. Take your time and take a few normal breaths between deep breaths. The spirometer may include an indicator to show your best effort. Use the indicator as a goal to work toward during each repetition. After each set of 10 deep breaths, practice coughing to be sure your lungs are clear. If you have an incision (the cut made at the time of surgery), support your incision when coughing by placing a pillow or rolled up towels firmly against it. Once you are able to get out of bed, walk around indoors and cough well. You may stop using the incentive spirometer when instructed by your caregiver.  RISKS AND COMPLICATIONS Take your time so you do not get dizzy or light-headed. If you are in pain, you may need to take or ask for pain medication before doing incentive spirometry. It is harder to take a deep breath if you are having pain. AFTER USE Rest and breathe slowly and easily. It can be helpful to keep track of a log of your progress. Your caregiver can provide you with a simple table to help with this. If you are using the spirometer at home, follow these instructions: [place] IF:  You are having difficultly using the spirometer. You have trouble using the spirometer as often as instructed. Your pain medication is not giving enough relief while using the spirometer. You develop fever of 100.5 F (38.1 C) or higher. SEEK  IMMEDIATE MEDICAL CARE IF:  You cough up bloody sputum that had not been present before. You develop fever of 102 F (38.9 C) or greater. You develop worsening pain at or near the incision site. MAKE SURE YOU:  Understand these instructions. Will watch your condition.  Will get help right away if you are not doing well or get worse. Document Released: 10/02/2006 Document Revised: 08/14/2011 Document Reviewed: 12/03/2006 Exit[employer] Patient Information 2014 Exit[employer], [place].   ________________________________________________________________________  WHAT IS A BLOOD TRANSFUSION? Blood Transfusion Information  A transfusion is the replacement of blood or some of its parts. Blood is made up of multiple cells which provide different functions. Red blood cells carry oxygen and are used for blood loss replacement. White blood cells fight against infection. Platelets control bleeding. Plasma helps clot blood. Other blood products are available for specialized needs, such as hemophilia or other clotting disorders. BEFORE THE TRANSFUSION  Who gives blood for transfusions?  Healthy volunteers who are fully evaluated to make sure their blood is safe. This is blood bank blood. Transfusion therapy is the safest it has ever been in the practice of medicine. Before blood is taken from a donor, a complete history is taken to make sure that person has no history of diseases nor engages in risky social behavior (examples are intravenous drug use or sexual activity with multiple partners). The donor's travel history is screened to minimize risk of transmitting infections, such as malaria. The donated blood is tested for signs of infectious diseases, such as HIV and hepatitis. The blood is then tested to be sure it is compatible with you in order to minimize the chance of a transfusion reaction. If you or a relative donates blood, this is often done in anticipation of surgery and is not appropriate for emergency  situations. It takes many days to process the donated blood. RISKS AND COMPLICATIONS Although transfusion therapy is very safe and saves many lives, the main dangers of transfusion include:  Getting an infectious disease. Developing a transfusion reaction. This is an allergic reaction to something in the blood you were given. Every precaution is taken to prevent this. The decision to have a blood transfusion has been considered carefully by your caregiver before blood is given. Blood is not given unless the benefits outweigh the risks. AFTER THE TRANSFUSION Right after receiving a blood transfusion, you will usually feel much better and more energetic. This is especially true if your red blood cells have gotten low (anemic). The transfusion raises the level of the red blood cells which carry oxygen, and this usually causes an energy increase. The nurse administering the transfusion will monitor you carefully for complications. HOME CARE INSTRUCTIONS  No special instructions are needed after a transfusion. You may find your energy is better. Speak with your caregiver about any limitations on activity for underlying diseases you may have. SEEK MEDICAL CARE IF:  Your condition is not improving after your transfusion. You develop redness or irritation at the intravenous (IV) site. SEEK IMMEDIATE MEDICAL CARE IF:  Any of the following symptoms occur over the next 12 hours: Shaking chills. You have a temperature by mouth above 102 F (38.9 C), not controlled by medicine. Chest, back, or muscle pain. People around you feel you are not acting correctly or are confused. Shortness of breath or difficulty breathing. Dizziness and fainting. You get a rash or develop hives. You have a decrease in urine output. Your urine turns a dark color or changes to pink, red, or brown. Any of the following symptoms occur over the next 10 days: You have a temperature by mouth above 102 F (38.9 C), not controlled  by medicine. Shortness of breath. Weakness after normal activity. The white part of the eye turns yellow (jaundice). You have a decrease  in the amount of urine or are urinating less often. Your urine turns a dark color or changes to pink, red, or brown. Document Released: 05/19/2000 Document Revised: 08/14/2011 Document Reviewed: 01/06/2008 [facility] Patient Information 2014 [place], [place].  _______________________________________________________________________

## 2021-05-11 NOTE — Progress Notes (Addendum)
COVID swab appointment:  05-12-21  COVID Vaccine Completed:  Yes x2 Date COVID Vaccine completed: 08-08-19 08-29-19 Has received booster: Yes x1 04-15-20 COVID vaccine manufacturer: Pfizer  &  Moderna    Date of COVID positive in last 90 days:  No  PCP - Roma Schanz, DO Cardiologist - Berniece Salines, DO  Chest x-ray - 01-19-21 Epic EKG - 05-09-21 Epic Stress Test - N/A ECHO - greater than 2 years, Epic Cardiac Cath - N/A Pacemaker/ICD device last checked: Spinal Cord Stimulator: Non-Telemetry Monitor - 2018 Epic  Sleep Study - N/A CPAP -   Fasting Blood Sugar - 116 to 119 Checks Blood Sugar - checks occasionally  Blood Thinner Instructions: N/A Aspirin Instructions: Last Dose:  Activity level:  Can go up a flight of stairs and perform activities of daily living without stopping and without symptoms of chest pain or shortness of breath.   Anesthesia review: Tachycardia and palpitations evaluated by cardiology.  HTN, pre-DM.  Palpitations stopped when she started Metoprolol  Patient denies shortness of breath, fever, cough and chest pain at PAT appointment   Patient verbalized understanding of instructions that were given to them at the PAT appointment. Patient was also instructed that they will need to review over the PAT instructions again at home before surgery.

## 2021-05-12 ENCOUNTER — Other Ambulatory Visit: Payer: Self-pay

## 2021-05-12 ENCOUNTER — Other Ambulatory Visit: Payer: Self-pay | Admitting: Family Medicine

## 2021-05-12 ENCOUNTER — Other Ambulatory Visit: Payer: Self-pay | Admitting: General Surgery

## 2021-05-12 ENCOUNTER — Encounter (HOSPITAL_COMMUNITY)
Admission: RE | Admit: 2021-05-12 | Discharge: 2021-05-12 | Disposition: A | Discharge status: Home / Self Care | Payer: BC Managed Care – PPO | Source: Ambulatory Visit | Attending: General Surgery | Admitting: General Surgery

## 2021-05-12 ENCOUNTER — Encounter (HOSPITAL_COMMUNITY): Payer: Self-pay

## 2021-05-12 DIAGNOSIS — I10 Essential (primary) hypertension: Secondary | ICD-10-CM | POA: Diagnosis not present

## 2021-05-12 DIAGNOSIS — Z01818 Encounter for other preprocedural examination: Secondary | ICD-10-CM

## 2021-05-12 DIAGNOSIS — Z6841 Body Mass Index (BMI) 40.0 and over, adult: Secondary | ICD-10-CM | POA: Diagnosis not present

## 2021-05-12 DIAGNOSIS — Z01812 Encounter for preprocedural laboratory examination: Secondary | ICD-10-CM | POA: Diagnosis not present

## 2021-05-12 DIAGNOSIS — E785 Hyperlipidemia, unspecified: Secondary | ICD-10-CM

## 2021-05-12 HISTORY — DX: Gastro-esophageal reflux disease without esophagitis: K21.9

## 2021-05-12 LAB — SARS CORONAVIRUS 2 (TAT 6-24 HRS): SARS Coronavirus 2: NEGATIVE

## 2021-05-12 LAB — GLUCOSE, CAPILLARY: Glucose-Capillary: 105 mg/dL — ABNORMAL HIGH (ref 70–99)

## 2021-05-16 ENCOUNTER — Inpatient Hospital Stay (HOSPITAL_COMMUNITY)
Admission: RE | Admit: 2021-05-16 | Discharge: 2021-05-17 | DRG: 621 | Disposition: A | Discharge status: Home / Self Care | Payer: BC Managed Care – PPO | Source: Ambulatory Visit | Attending: General Surgery | Admitting: General Surgery

## 2021-05-16 ENCOUNTER — Other Ambulatory Visit: Payer: Self-pay

## 2021-05-16 ENCOUNTER — Encounter (HOSPITAL_COMMUNITY)
Admission: RE | Disposition: A | Discharge status: Home / Self Care | Payer: Self-pay | Source: Ambulatory Visit | Attending: General Surgery

## 2021-05-16 ENCOUNTER — Inpatient Hospital Stay (HOSPITAL_COMMUNITY): Payer: BC Managed Care – PPO | Admitting: Certified Registered Nurse Anesthetist

## 2021-05-16 ENCOUNTER — Inpatient Hospital Stay (HOSPITAL_COMMUNITY): Payer: BC Managed Care – PPO | Admitting: Physician Assistant

## 2021-05-16 ENCOUNTER — Encounter (HOSPITAL_COMMUNITY): Payer: Self-pay | Admitting: General Surgery

## 2021-05-16 DIAGNOSIS — Z6841 Body Mass Index (BMI) 40.0 and over, adult: Secondary | ICD-10-CM

## 2021-05-16 DIAGNOSIS — E559 Vitamin D deficiency, unspecified: Secondary | ICD-10-CM | POA: Diagnosis present

## 2021-05-16 DIAGNOSIS — Z83438 Family history of other disorder of lipoprotein metabolism and other lipidemia: Secondary | ICD-10-CM

## 2021-05-16 DIAGNOSIS — E782 Mixed hyperlipidemia: Secondary | ICD-10-CM | POA: Diagnosis present

## 2021-05-16 DIAGNOSIS — J4520 Mild intermittent asthma, uncomplicated: Secondary | ICD-10-CM | POA: Diagnosis present

## 2021-05-16 DIAGNOSIS — Z01818 Encounter for other preprocedural examination: Secondary | ICD-10-CM

## 2021-05-16 DIAGNOSIS — F419 Anxiety disorder, unspecified: Secondary | ICD-10-CM | POA: Diagnosis present

## 2021-05-16 DIAGNOSIS — E119 Type 2 diabetes mellitus without complications: Secondary | ICD-10-CM | POA: Diagnosis present

## 2021-05-16 DIAGNOSIS — Z8249 Family history of ischemic heart disease and other diseases of the circulatory system: Secondary | ICD-10-CM | POA: Diagnosis not present

## 2021-05-16 DIAGNOSIS — Z800 Family history of malignant neoplasm of digestive organs: Secondary | ICD-10-CM

## 2021-05-16 DIAGNOSIS — Z9884 Bariatric surgery status: Secondary | ICD-10-CM

## 2021-05-16 DIAGNOSIS — F32A Depression, unspecified: Secondary | ICD-10-CM | POA: Diagnosis present

## 2021-05-16 DIAGNOSIS — Z8349 Family history of other endocrine, nutritional and metabolic diseases: Secondary | ICD-10-CM

## 2021-05-16 DIAGNOSIS — I10 Essential (primary) hypertension: Secondary | ICD-10-CM | POA: Diagnosis present

## 2021-05-16 HISTORY — PX: LAPAROSCOPIC GASTRIC SLEEVE RESECTION: SHX5895

## 2021-05-16 HISTORY — PX: UPPER GI ENDOSCOPY: SHX6162

## 2021-05-16 LAB — TYPE AND SCREEN
ABO/RH(D): A POS
Antibody Screen: NEGATIVE

## 2021-05-16 LAB — HEMOGLOBIN AND HEMATOCRIT, BLOOD
HCT: 44.3 % (ref 36.0–46.0)
Hemoglobin: 14.7 g/dL (ref 12.0–15.0)

## 2021-05-16 LAB — PREGNANCY, URINE: Preg Test, Ur: NEGATIVE

## 2021-05-16 LAB — GLUCOSE, CAPILLARY
Glucose-Capillary: 103 mg/dL — ABNORMAL HIGH (ref 70–99)
Glucose-Capillary: 138 mg/dL — ABNORMAL HIGH (ref 70–99)
Glucose-Capillary: 148 mg/dL — ABNORMAL HIGH (ref 70–99)

## 2021-05-16 SURGERY — GASTRECTOMY, SLEEVE, LAPAROSCOPIC
Anesthesia: General | Site: Abdomen

## 2021-05-16 MED ORDER — SERTRALINE HCL 100 MG PO TABS
200.0000 mg | ORAL_TABLET | Freq: Every day | ORAL | Status: DC
  Administered 2021-05-17: 200 mg via ORAL
  Filled 2021-05-16: qty 2

## 2021-05-16 MED ORDER — INSULIN ASPART 100 UNIT/ML IJ SOLN
0.0000 [IU] | INTRAMUSCULAR | Status: DC
  Administered 2021-05-16: 2 [IU] via SUBCUTANEOUS
  Administered 2021-05-17 (×2): 3 [IU] via SUBCUTANEOUS

## 2021-05-16 MED ORDER — ENOXAPARIN SODIUM 30 MG/0.3ML IJ SOSY
30.0000 mg | PREFILLED_SYRINGE | Freq: Two times a day (BID) | INTRAMUSCULAR | Status: DC
  Administered 2021-05-17 (×2): 30 mg via SUBCUTANEOUS
  Filled 2021-05-16 (×2): qty 0.3

## 2021-05-16 MED ORDER — FENTANYL CITRATE PF 50 MCG/ML IJ SOSY
PREFILLED_SYRINGE | INTRAMUSCULAR | Status: AC
  Filled 2021-05-16: qty 1

## 2021-05-16 MED ORDER — KETOROLAC TROMETHAMINE 30 MG/ML IJ SOLN
INTRAMUSCULAR | Status: AC
  Filled 2021-05-16: qty 1

## 2021-05-16 MED ORDER — SUGAMMADEX SODIUM 500 MG/5ML IV SOLN
INTRAVENOUS | Status: AC
  Filled 2021-05-16: qty 5

## 2021-05-16 MED ORDER — HEPARIN SODIUM (PORCINE) 5000 UNIT/ML IJ SOLN
5000.0000 [IU] | INTRAMUSCULAR | Status: AC
  Administered 2021-05-16: 5000 [IU] via SUBCUTANEOUS
  Filled 2021-05-16: qty 1

## 2021-05-16 MED ORDER — KETOROLAC TROMETHAMINE 30 MG/ML IJ SOLN
INTRAMUSCULAR | Status: DC | PRN
  Administered 2021-05-16: 30 mg via INTRAVENOUS

## 2021-05-16 MED ORDER — SODIUM CHLORIDE (PF) 0.9 % IJ SOLN
INTRAMUSCULAR | Status: DC | PRN
  Administered 2021-05-16: 50 mL

## 2021-05-16 MED ORDER — HYDRALAZINE HCL 20 MG/ML IJ SOLN: 10.0000 mg | INTRAMUSCULAR | Status: DC | PRN

## 2021-05-16 MED ORDER — KETAMINE HCL 10 MG/ML IJ SOLN
INTRAMUSCULAR | Status: DC | PRN
  Administered 2021-05-16: 15 mg via INTRAVENOUS
  Administered 2021-05-16: 10 mg via INTRAVENOUS

## 2021-05-16 MED ORDER — LIDOCAINE HCL 2 % IJ SOLN
INTRAMUSCULAR | Status: AC
  Filled 2021-05-16: qty 20

## 2021-05-16 MED ORDER — DIPHENHYDRAMINE HCL 50 MG/ML IJ SOLN: 12.5000 mg | Freq: Three times a day (TID) | INTRAMUSCULAR | Status: DC | PRN

## 2021-05-16 MED ORDER — KETAMINE HCL 10 MG/ML IJ SOLN
INTRAMUSCULAR | Status: AC
  Filled 2021-05-16: qty 1

## 2021-05-16 MED ORDER — LACTATED RINGERS IV SOLN: INTRAVENOUS | Status: DC

## 2021-05-16 MED ORDER — PHENYLEPHRINE 40 MCG/ML (10ML) SYRINGE FOR IV PUSH (FOR BLOOD PRESSURE SUPPORT)
PREFILLED_SYRINGE | INTRAVENOUS | Status: DC | PRN
  Administered 2021-05-16: 80 ug via INTRAVENOUS

## 2021-05-16 MED ORDER — CHLORHEXIDINE GLUCONATE 4 % EX LIQD: 60.0000 mL | Freq: Once | CUTANEOUS | Status: DC

## 2021-05-16 MED ORDER — ROCURONIUM BROMIDE 10 MG/ML (PF) SYRINGE
PREFILLED_SYRINGE | INTRAVENOUS | Status: AC
  Filled 2021-05-16: qty 10

## 2021-05-16 MED ORDER — GABAPENTIN 300 MG PO CAPS
ORAL_CAPSULE | ORAL | Status: AC
  Filled 2021-05-16: qty 1

## 2021-05-16 MED ORDER — OXYCODONE HCL 5 MG PO TABS: 5.0000 mg | ORAL_TABLET | Freq: Once | ORAL | Status: DC | PRN

## 2021-05-16 MED ORDER — BUPIVACAINE LIPOSOME 1.3 % IJ SUSP
INTRAMUSCULAR | Status: AC
  Filled 2021-05-16: qty 20

## 2021-05-16 MED ORDER — ACETAMINOPHEN 500 MG PO TABS: 1000.0000 mg | ORAL_TABLET | Freq: Once | ORAL | Status: DC

## 2021-05-16 MED ORDER — METOPROLOL SUCCINATE ER 100 MG PO TB24
100.0000 mg | ORAL_TABLET | Freq: Every day | ORAL | Status: DC
  Administered 2021-05-17: 100 mg via ORAL
  Filled 2021-05-16: qty 1

## 2021-05-16 MED ORDER — PROPOFOL 10 MG/ML IV BOLUS
INTRAVENOUS | Status: AC
  Filled 2021-05-16: qty 20

## 2021-05-16 MED ORDER — DEXAMETHASONE SODIUM PHOSPHATE 4 MG/ML IJ SOLN: 4.0000 mg | INTRAMUSCULAR | Status: DC

## 2021-05-16 MED ORDER — FENTANYL CITRATE (PF) 100 MCG/2ML IJ SOLN
INTRAMUSCULAR | Status: DC | PRN
  Administered 2021-05-16: 100 ug via INTRAVENOUS

## 2021-05-16 MED ORDER — OXYCODONE HCL 5 MG/5ML PO SOLN: 5.0000 mg | Freq: Four times a day (QID) | ORAL | Status: DC | PRN

## 2021-05-16 MED ORDER — FLUTICASONE PROPIONATE 50 MCG/ACT NA SUSP
2.0000 | Freq: Every day | NASAL | Status: DC
  Administered 2021-05-17: 2 via NASAL
  Filled 2021-05-16: qty 16

## 2021-05-16 MED ORDER — LIDOCAINE 2% (20 MG/ML) 5 ML SYRINGE
INTRAMUSCULAR | Status: DC | PRN
  Administered 2021-05-16: 100 mg via INTRAVENOUS

## 2021-05-16 MED ORDER — ORAL CARE MOUTH RINSE: 15.0000 mL | Freq: Once | OROMUCOSAL | Status: AC

## 2021-05-16 MED ORDER — ALBUTEROL SULFATE HFA 108 (90 BASE) MCG/ACT IN AERS: 2.0000 | INHALATION_SPRAY | Freq: Four times a day (QID) | RESPIRATORY_TRACT | Status: DC | PRN

## 2021-05-16 MED ORDER — ENSURE MAX PROTEIN PO LIQD
2.0000 [oz_av] | ORAL | Status: DC
  Administered 2021-05-17 (×5): 2 [oz_av] via ORAL

## 2021-05-16 MED ORDER — OXYCODONE HCL 5 MG/5ML PO SOLN: 5.0000 mg | Freq: Once | ORAL | Status: DC | PRN

## 2021-05-16 MED ORDER — ONDANSETRON HCL 4 MG/2ML IJ SOLN
INTRAMUSCULAR | Status: AC
  Filled 2021-05-16: qty 2

## 2021-05-16 MED ORDER — MORPHINE SULFATE (PF) 2 MG/ML IV SOLN: 1.0000 mg | INTRAVENOUS | Status: DC | PRN

## 2021-05-16 MED ORDER — PANTOPRAZOLE SODIUM 40 MG IV SOLR
40.0000 mg | Freq: Every day | INTRAVENOUS | Status: DC
  Administered 2021-05-16: 40 mg via INTRAVENOUS
  Filled 2021-05-16: qty 40

## 2021-05-16 MED ORDER — SCOPOLAMINE 1 MG/3DAYS TD PT72
1.0000 | MEDICATED_PATCH | TRANSDERMAL | Status: DC
  Administered 2021-05-16: 1.5 mg via TRANSDERMAL
  Filled 2021-05-16: qty 1

## 2021-05-16 MED ORDER — MIDAZOLAM HCL 2 MG/2ML IJ SOLN
INTRAMUSCULAR | Status: AC
  Filled 2021-05-16: qty 2

## 2021-05-16 MED ORDER — ALBUTEROL SULFATE (2.5 MG/3ML) 0.083% IN NEBU: 2.5000 mg | INHALATION_SOLUTION | Freq: Four times a day (QID) | RESPIRATORY_TRACT | Status: DC | PRN

## 2021-05-16 MED ORDER — ACETAMINOPHEN 160 MG/5ML PO SOLN: 1000.0000 mg | Freq: Three times a day (TID) | ORAL | Status: DC

## 2021-05-16 MED ORDER — FENTANYL CITRATE PF 50 MCG/ML IJ SOSY
25.0000 ug | PREFILLED_SYRINGE | INTRAMUSCULAR | Status: DC | PRN
  Administered 2021-05-16: 50 ug via INTRAVENOUS

## 2021-05-16 MED ORDER — BUPROPION HCL ER (SR) 100 MG PO TB12: 200.0000 mg | ORAL_TABLET | Freq: Every day | ORAL | Status: DC

## 2021-05-16 MED ORDER — ACETAMINOPHEN 500 MG PO TABS
1000.0000 mg | ORAL_TABLET | Freq: Three times a day (TID) | ORAL | Status: DC
  Administered 2021-05-16 – 2021-05-17 (×3): 1000 mg via ORAL
  Filled 2021-05-16 (×3): qty 2

## 2021-05-16 MED ORDER — CHLORHEXIDINE GLUCONATE 0.12 % MT SOLN
15.0000 mL | Freq: Once | OROMUCOSAL | Status: AC
  Administered 2021-05-16: 15 mL via OROMUCOSAL

## 2021-05-16 MED ORDER — SODIUM CHLORIDE 0.9 % IV SOLN
2.0000 g | INTRAVENOUS | Status: AC
  Administered 2021-05-16: 2 g via INTRAVENOUS
  Filled 2021-05-16: qty 2

## 2021-05-16 MED ORDER — ACETAMINOPHEN 500 MG PO TABS
1000.0000 mg | ORAL_TABLET | ORAL | Status: AC
  Administered 2021-05-16: 1000 mg via ORAL
  Filled 2021-05-16: qty 2

## 2021-05-16 MED ORDER — ONDANSETRON HCL 4 MG/2ML IJ SOLN
INTRAMUSCULAR | Status: DC | PRN
  Administered 2021-05-16: 4 mg via INTRAVENOUS

## 2021-05-16 MED ORDER — FENTANYL CITRATE (PF) 100 MCG/2ML IJ SOLN
INTRAMUSCULAR | Status: AC
  Filled 2021-05-16: qty 2

## 2021-05-16 MED ORDER — SODIUM CHLORIDE 0.9 % IV SOLN
12.5000 mg | Freq: Four times a day (QID) | INTRAVENOUS | Status: DC | PRN
  Filled 2021-05-16: qty 0.5

## 2021-05-16 MED ORDER — ONDANSETRON HCL 4 MG/2ML IJ SOLN: 4.0000 mg | Freq: Once | INTRAMUSCULAR | Status: DC | PRN

## 2021-05-16 MED ORDER — DEXAMETHASONE SODIUM PHOSPHATE 10 MG/ML IJ SOLN
INTRAMUSCULAR | Status: DC | PRN
  Administered 2021-05-16: 4 mg via INTRAVENOUS

## 2021-05-16 MED ORDER — PROPOFOL 10 MG/ML IV BOLUS
INTRAVENOUS | Status: DC | PRN
  Administered 2021-05-16: 200 mg via INTRAVENOUS

## 2021-05-16 MED ORDER — GABAPENTIN 300 MG PO CAPS
300.0000 mg | ORAL_CAPSULE | ORAL | Status: AC
  Administered 2021-05-16: 300 mg via ORAL
  Filled 2021-05-16: qty 1

## 2021-05-16 MED ORDER — DEXAMETHASONE SODIUM PHOSPHATE 10 MG/ML IJ SOLN
INTRAMUSCULAR | Status: AC
  Filled 2021-05-16: qty 1

## 2021-05-16 MED ORDER — ROCURONIUM BROMIDE 10 MG/ML (PF) SYRINGE
PREFILLED_SYRINGE | INTRAVENOUS | Status: DC | PRN
  Administered 2021-05-16: 100 mg via INTRAVENOUS

## 2021-05-16 MED ORDER — KCL IN DEXTROSE-NACL 20-5-0.45 MEQ/L-%-% IV SOLN
INTRAVENOUS | Status: DC
  Filled 2021-05-16 (×2): qty 1000

## 2021-05-16 MED ORDER — LACTATED RINGERS IR SOLN
Status: DC | PRN
  Administered 2021-05-16: 3000 mL

## 2021-05-16 MED ORDER — LIDOCAINE 2% (20 MG/ML) 5 ML SYRINGE
INTRAMUSCULAR | Status: DC | PRN
  Administered 2021-05-16: 1.5 mg/kg/h via INTRAVENOUS

## 2021-05-16 MED ORDER — BUPIVACAINE LIPOSOME 1.3 % IJ SUSP
INTRAMUSCULAR | Status: DC | PRN
  Administered 2021-05-16: 20 mL

## 2021-05-16 MED ORDER — SUGAMMADEX SODIUM 500 MG/5ML IV SOLN
INTRAVENOUS | Status: DC | PRN
  Administered 2021-05-16: 500 mg via INTRAVENOUS

## 2021-05-16 MED ORDER — MIDAZOLAM HCL 2 MG/2ML IJ SOLN
INTRAMUSCULAR | Status: DC | PRN
  Administered 2021-05-16: 2 mg via INTRAVENOUS

## 2021-05-16 MED ORDER — ONDANSETRON HCL 4 MG/2ML IJ SOLN: 4.0000 mg | Freq: Four times a day (QID) | INTRAMUSCULAR | Status: DC | PRN

## 2021-05-16 MED ORDER — SIMETHICONE 80 MG PO CHEW: 80.0000 mg | CHEWABLE_TABLET | Freq: Four times a day (QID) | ORAL | Status: DC | PRN

## 2021-05-16 MED ORDER — BUPIVACAINE LIPOSOME 1.3 % IJ SUSP: 20.0000 mL | Freq: Once | INTRAMUSCULAR | Status: DC

## 2021-05-16 MED ORDER — APREPITANT 40 MG PO CAPS
40.0000 mg | ORAL_CAPSULE | ORAL | Status: AC
  Administered 2021-05-16: 40 mg via ORAL
  Filled 2021-05-16: qty 1

## 2021-05-16 MED ORDER — AMISULPRIDE (ANTIEMETIC) 5 MG/2ML IV SOLN: 10.0000 mg | Freq: Once | INTRAVENOUS | Status: DC | PRN

## 2021-05-16 MED ORDER — BUPROPION HCL ER (SR) 100 MG PO TB12
200.0000 mg | ORAL_TABLET | Freq: Every day | ORAL | Status: DC
  Administered 2021-05-17: 200 mg via ORAL
  Filled 2021-05-16: qty 2

## 2021-05-16 MED ORDER — LIDOCAINE HCL (PF) 2 % IJ SOLN
INTRAMUSCULAR | Status: AC
  Filled 2021-05-16: qty 5

## 2021-05-16 SURGICAL SUPPLY — 78 items
APPLICATOR COTTON TIP 6 STRL (MISCELLANEOUS) IMPLANT
APPLICATOR COTTON TIP 6IN STRL (MISCELLANEOUS)
APPLIER CLIP ROT 10 11.4 M/L (STAPLE)
APPLIER CLIP ROT 13.4 12 LRG (CLIP)
BAG COUNTER SPONGE SURGICOUNT (BAG) IMPLANT
BENZOIN TINCTURE PRP APPL 2/3 (GAUZE/BANDAGES/DRESSINGS) ×3 IMPLANT
BLADE SURG SZ11 CARB STEEL (BLADE) ×3 IMPLANT
BNDG ADH 1X3 SHEER STRL LF (GAUZE/BANDAGES/DRESSINGS) ×18 IMPLANT
CABLE HIGH FREQUENCY MONO STRZ (ELECTRODE) ×3 IMPLANT
CHLORAPREP W/TINT 26 (MISCELLANEOUS) ×6 IMPLANT
CLIP APPLIE ROT 10 11.4 M/L (STAPLE) IMPLANT
CLIP APPLIE ROT 13.4 12 LRG (CLIP) IMPLANT
COVER SURGICAL LIGHT HANDLE (MISCELLANEOUS) ×3 IMPLANT
DECANTER SPIKE VIAL GLASS SM (MISCELLANEOUS) ×3 IMPLANT
DERMABOND ADVANCED (GAUZE/BANDAGES/DRESSINGS) ×1
DERMABOND ADVANCED .7 DNX12 (GAUZE/BANDAGES/DRESSINGS) IMPLANT
DEVICE SUT QUICK LOAD TK 5 (STAPLE) IMPLANT
DEVICE SUT TI-KNOT TK 5X26 (MISCELLANEOUS) IMPLANT
DEVICE SUTURE ENDOST 10MM (ENDOMECHANICALS) IMPLANT
DISSECTOR BLUNT TIP ENDO 5MM (MISCELLANEOUS) IMPLANT
DRAPE UTILITY XL STRL (DRAPES) ×6 IMPLANT
ELECT L-HOOK LAP 45CM DISP (ELECTROSURGICAL)
ELECT REM PT RETURN 15FT ADLT (MISCELLANEOUS) ×3 IMPLANT
ELECTRODE L-HOOK LAP 45CM DISP (ELECTROSURGICAL) IMPLANT
GAUZE SPONGE 2X2 8PLY STRL LF (GAUZE/BANDAGES/DRESSINGS) IMPLANT
GAUZE SPONGE 4X4 12PLY STRL (GAUZE/BANDAGES/DRESSINGS) IMPLANT
GLOVE SRG 8 PF TXTR STRL LF DI (GLOVE) ×2 IMPLANT
GLOVE SURG POLY ORTHO LF SZ7.5 (GLOVE) ×3 IMPLANT
GLOVE SURG UNDER POLY LF SZ8 (GLOVE) ×1
GOWN STRL REUS W/TWL XL LVL3 (GOWN DISPOSABLE) ×9 IMPLANT
GRASPER SUT TROCAR 14GX15 (MISCELLANEOUS) ×3 IMPLANT
IRRIG SUCT STRYKERFLOW 2 WTIP (MISCELLANEOUS) ×3
IRRIGATION SUCT STRKRFLW 2 WTP (MISCELLANEOUS) ×2 IMPLANT
KIT BASIN OR (CUSTOM PROCEDURE TRAY) ×3 IMPLANT
KIT TURNOVER KIT A (KITS) IMPLANT
MARKER SKIN DUAL TIP RULER LAB (MISCELLANEOUS) ×3 IMPLANT
MAT PREVALON FULL STRYKER (MISCELLANEOUS) ×3 IMPLANT
NDL SPNL 22GX3.5 QUINCKE BK (NEEDLE) ×2 IMPLANT
NEEDLE SPNL 22GX3.5 QUINCKE BK (NEEDLE) ×3 IMPLANT
PACK UNIVERSAL I (CUSTOM PROCEDURE TRAY) ×3 IMPLANT
PENCIL SMOKE EVACUATOR (MISCELLANEOUS) IMPLANT
RELOAD STAPLE 60 3.6 BLU REG (STAPLE) ×2 IMPLANT
RELOAD STAPLE 60 3.8 GOLD REG (STAPLE) IMPLANT
RELOAD STAPLE 60 4.1 GRN THCK (STAPLE) ×2 IMPLANT
RELOAD STAPLE 60 BLK VRY/THCK (STAPLE) IMPLANT
RELOAD STAPLER 60MM BLK (STAPLE) IMPLANT
RELOAD STAPLER BLUE 60MM (STAPLE) ×4 IMPLANT
RELOAD STAPLER GOLD 60MM (STAPLE) IMPLANT
RELOAD STAPLER GREEN 60MM (STAPLE) ×4 IMPLANT
SCISSORS LAP 5X45 EPIX DISP (ENDOMECHANICALS) IMPLANT
SEALANT SURGICAL APPL DUAL CAN (MISCELLANEOUS) IMPLANT
SET TUBE SMOKE EVAC HIGH FLOW (TUBING) ×3 IMPLANT
SHEARS HARMONIC ACE PLUS 45CM (MISCELLANEOUS) ×3 IMPLANT
SLEEVE GASTRECTOMY 40FR VISIGI (MISCELLANEOUS) ×3 IMPLANT
SLEEVE XCEL OPT CAN 5 100 (ENDOMECHANICALS) ×9 IMPLANT
SOL ANTI FOG 6CC (MISCELLANEOUS) ×2 IMPLANT
SOLUTION ANTI FOG 6CC (MISCELLANEOUS) ×1
SPONGE GAUZE 2X2 STER 10/PKG (GAUZE/BANDAGES/DRESSINGS)
SPONGE T-LAP 18X18 ~~LOC~~+RFID (SPONGE) ×3 IMPLANT
STAPLE LINE REINFORCEMENT LAP (STAPLE) ×6 IMPLANT
STAPLER ECHELON BIOABSB 60 FLE (MISCELLANEOUS) ×12 IMPLANT
STAPLER ECHELON LONG 60 440 (INSTRUMENTS) ×4 IMPLANT
STAPLER RELOAD 60MM BLK (STAPLE)
STAPLER RELOAD BLUE 60MM (STAPLE) ×6
STAPLER RELOAD GOLD 60MM (STAPLE)
STAPLER RELOAD GREEN 60MM (STAPLE) ×6
STRIP CLOSURE SKIN 1/2X4 (GAUZE/BANDAGES/DRESSINGS) ×3 IMPLANT
SUT MNCRL AB 4-0 PS2 18 (SUTURE) ×3 IMPLANT
SUT SURGIDAC NAB ES-9 0 48 120 (SUTURE) IMPLANT
SUT VICRYL 0 TIES 12 18 (SUTURE) ×3 IMPLANT
SYR 20ML LL LF (SYRINGE) ×3 IMPLANT
SYR 50ML LL SCALE MARK (SYRINGE) ×3 IMPLANT
TOWEL OR 17X26 10 PK STRL BLUE (TOWEL DISPOSABLE) ×3 IMPLANT
TOWEL OR NON WOVEN STRL DISP B (DISPOSABLE) ×3 IMPLANT
TROCAR BLADELESS 15MM (ENDOMECHANICALS) ×3 IMPLANT
TROCAR BLADELESS OPT 5 100 (ENDOMECHANICALS) ×3 IMPLANT
TUBING CONNECTING 10 (TUBING) ×6 IMPLANT
TUBING ENDO SMARTCAP (MISCELLANEOUS) ×3 IMPLANT

## 2021-05-16 NOTE — Transfer of Care (Signed)
Immediate Anesthesia Transfer of Care Note  Patient: Jeanne [name]  Procedure(s) Performed: LAPAROSCOPIC GASTRIC SLEEVE RESECTION (Abdomen) UPPER GI ENDOSCOPY  Patient Location: PACU  Anesthesia Type:General  Level of Consciousness: drowsy  Airway & Oxygen Therapy: Patient Spontanous Breathing and Patient connected to face mask  Post-op Assessment: Report given to RN and Post -op Vital signs reviewed and stable  Post vital signs: Reviewed and stable  Last Vitals:  Vitals Value Taken Time  BP 153/87 05/16/21 1645  Temp    Pulse 64 05/16/21 1649  Resp 22 05/16/21 1649  SpO2 92 % 05/16/21 1649  Vitals shown include unvalidated device data.  Last Pain:  Vitals:   05/16/21 1342  TempSrc:   PainSc: 0-No pain      Patients Stated Pain Goal: 5 (84/13/24 4010)  Complications: No notable events documented.

## 2021-05-16 NOTE — Anesthesia Procedure Notes (Signed)
Procedure Name: Intubation Date/Time: 05/16/2021 3:22 PM Performed by: Niel Hummer, CRNA Pre-anesthesia Checklist: Patient identified, Emergency Drugs available, Suction available and Patient being monitored Patient Re-evaluated:Patient Re-evaluated prior to induction Oxygen Delivery Method: Circle system utilized Preoxygenation: Pre-oxygenation with 100% oxygen Induction Type: IV induction Ventilation: Mask ventilation without difficulty and Oral airway inserted - appropriate to patient size Laryngoscope Size: Mac and 4 Grade View: Grade I Tube type: Oral Tube size: 7.0 mm Number of attempts: 1 Airway Equipment and Method: Stylet Placement Confirmation: ETT inserted through vocal cords under direct vision, positive ETCO2 and breath sounds checked- equal and bilateral Secured at: 23 cm Tube secured with: Tape Dental Injury: Teeth and Oropharynx as per pre-operative assessment

## 2021-05-16 NOTE — Progress Notes (Addendum)
PHARMACY CONSULT FOR:  Risk Assessment for Post-Discharge VTE Following Bariatric Surgery  Post-Discharge VTE Risk Assessment: This patient's probability of 30-day post-discharge VTE is increased due to the factors marked:   Female    Age >/=60 years   x BMI >/=50 kg/m2    CHF    Dyspnea at Rest    Paraplegia  x  Non-gastric-band surgery    Operation Time >/=3 hr    Return to OR     Length of Stay >/= 3 d   Hx of VTE   Hypercoagulable condition   Significant venous stasis       Predicted probability of 30-day post-discharge VTE: 0.27%  Other patient-specific factors to consider: n/a   Recommendation for Discharge: No pharmacologic prophylaxis post-discharge    Jeanne [name] is a 40 y.o. female who underwent laparoscopic sleeve gastrectomy on 05/16/21   Case start: 1537 Case end: 1634   No Known Allergies  Patient Measurements: Height: 5\' 2"  (157.5 cm) Weight: (!) 139.3 kg (307 lb) IBW/kg (Calculated) : 50.1 Body mass index is 56.15 kg/m.  No results for input(s): WBC, HGB, HCT, PLT, APTT, CREATININE, LABCREA, CREATININE, CREAT24HRUR, MG, PHOS, ALBUMIN, PROT, ALBUMIN, AST, ALT, ALKPHOS, BILITOT, BILIDIR, IBILI in the last 72 hours. Estimated Creatinine Clearance: 129.1 mL/min (by C-G formula based on SCr of 0.64 mg/dL).    Past Medical History:  Diagnosis Date   Anxiety    Asthma    Dry eyes    Dry skin    Eczema    Fatigue    Fibroids    GERD (gastroesophageal reflux disease)    H/O echocardiogram    only showed tachycardia   Heartburn    HTN (hypertension)    Hyperlipidemia    Palpitations    Pre-diabetes     pre diabetes, Metformin ordered from weight loss clinic   Rash in adult    Shortness of breath    due to allergies   Stress    Tachycardia    Vitamin D deficiency    Wheezing      Medications Prior to Admission  Medication Sig Dispense Refill Last Dose   albuterol (VENTOLIN HFA) 108 (90 Base) MCG/ACT inhaler TAKE 2 PUFFS BY MOUTH  EVERY 6 HOURS AS NEEDED FOR WHEEZE OR SHORTNESS OF BREATH (Patient taking differently: Inhale 2 puffs into the lungs every 6 (six) hours as needed for wheezing or shortness of breath.) 90 g 1 Past Month   buPROPion (WELLBUTRIN SR) 200 MG 12 hr tablet Take 1 tablet (200 mg total) by mouth daily. 90 tablet 1 05/16/2021   fluticasone (FLONASE) 50 MCG/ACT nasal spray SPRAY 2 SPRAYS INTO EACH NOSTRIL EVERY DAY 48 mL 5    metoprolol succinate (TOPROL-XL) 100 MG 24 hr tablet TAKE 1 TABLET BY MOUTH DAILY. TAKE WITH OR IMMEDIATELY FOLLOWING A MEAL. 90 tablet 1 05/16/2021   QVAR REDIHALER 40 MCG/ACT inhaler TAKE 2 PUFFS BY MOUTH TWICE A DAY 10.6 g 5 05/16/2021   sertraline (ZOLOFT) 100 MG tablet Take 2 tablets (200 mg total) by mouth daily. 180 tablet 3 05/16/2021   oseltamivir (TAMIFLU) 75 MG capsule Take 1 capsule (75 mg total) by mouth every 12 (twelve) hours. 10 capsule 0 Unknown   triamcinolone cream (KENALOG) 0.1 % Apply 1 application topically 2 (two) times daily as needed (for eczema on hands.). 30 g 3 More than a month       Kara Mead 05/16/2021,4:53 PM

## 2021-05-16 NOTE — Op Note (Signed)
Preoperative diagnosis: laparoscopic sleeve gastrectomy  Postoperative diagnosis: Same   Procedure: Upper endoscopy   Surgeon: Ramona Ruark A Ryun Velez, M.D.  Anesthesia: Gen.   Description of procedure: The endoscope was placed in the mouth and oropharynx and under endoscopic vision it was advanced to the esophagogastric junction which was identified at 36cm from the teeth.  The pouch was tensely insufflated while the upper abdomen was flooded with irrigation to perform a leak test, which was negative. No bubbles were seen.  The staple line was hemostatic and the lumen was evenly tubular without undue narrowing, angulation or twisting specifically at the incisura angularis. The lumen was decompressed and the scope was withdrawn without difficulty.    Zyair Russi A Bostyn Kunkler, M.D. General, Bariatric, & Minimally Invasive Surgery [facility]    

## 2021-05-16 NOTE — Anesthesia Preprocedure Evaluation (Signed)
Anesthesia Evaluation  Patient identified by MRN, date of birth, ID band Patient awake    Reviewed: Allergy & Precautions, NPO status , Patient's Chart, lab work & pertinent test results  History of Anesthesia Complications Negative for: history of anesthetic complications  Airway Mallampati: II  TM Distance: >3 FB Neck ROM: Full    Dental  (+) Teeth Intact, Dental Advisory Given   Pulmonary asthma ,    Pulmonary exam normal        Cardiovascular hypertension, Normal cardiovascular exam     Neuro/Psych negative neurological ROS     GI/Hepatic Neg liver ROS, GERD  ,  Endo/Other  Morbid obesity  Renal/GU negative Renal ROS  negative genitourinary   Musculoskeletal negative musculoskeletal ROS (+)   Abdominal   Peds  Hematology negative hematology ROS (+)   Anesthesia Other Findings   Reproductive/Obstetrics                            Anesthesia Physical Anesthesia Plan  ASA: 3  Anesthesia Plan: General   Post-op Pain Management: Toradol IV (intra-op), Tylenol PO (pre-op) and Lidocaine infusion   Induction: Intravenous  PONV Risk Score and Plan: 4 or greater and Ondansetron, Dexamethasone, Treatment may vary due to age or medical condition and Midazolam  Airway Management Planned: Oral ETT  Additional Equipment: None  Intra-op Plan:   Post-operative Plan: Extubation in OR  Informed Consent: I have reviewed the patients History and Physical, chart, labs and discussed the procedure including the risks, benefits and alternatives for the proposed anesthesia with the patient or authorized representative who has indicated his/her understanding and acceptance.     Dental advisory given  Plan Discussed with:   Anesthesia Plan Comments:         Anesthesia Quick Evaluation

## 2021-05-16 NOTE — Op Note (Signed)
05/16/2021 [name] 06-Jun-1980 [id number]   PRE-OPERATIVE DIAGNOSIS:   Severe obesity (BMI 25)  Hypercholesterolemia with hypertriglyceridemia  Essential hypertension  Diabetes mellitus type 2 in obese (CMS-HCC)  Mild intermittent asthma without complication  Vitamin D deficiency  Anxiety and depression  POST-OPERATIVE DIAGNOSIS:  same  PROCEDURE:  Procedure(s): LAPAROSCOPIC SLEEVE GASTRECTOMY  UPPER GI ENDOSCOPY  SURGEON:  Surgeon(s): Gayland Curry, MD FACS FASMBS  ASSISTANTS: Romana Juniper MD FACS  ANESTHESIA:   general  DRAINS: none   BOUGIE: 40 fr ViSiGi  LOCAL MEDICATIONS USED:   Exparel  EBL: minimal  SPECIMEN:  Source of Specimen:  Greater curvature of stomach  DISPOSITION OF SPECIMEN:  PATHOLOGY  COUNTS:  YES  INDICATION FOR PROCEDURE: This is a very pleasant 40 y.o.-year-old morbidly obese female who has had unsuccessful attempts for sustained weight loss. The patient presents today for a planned laparoscopic sleeve gastrectomy with upper endoscopy. We have discussed the risk and benefits of the procedure extensively preoperatively. Please see my separate notes.  PROCEDURE: After obtaining informed consent and receiving 5000 units of subcutaneous heparin, the patient was brought to the operating room at [facility] and placed supine on the operating room table. General endotracheal anesthesia was established. Sequential compression devices were placed. A orogastric tube was placed. The patient's abdomen was prepped and draped in the usual standard surgical fashion. The patient received preoperative IV antibiotic. A surgical timeout was performed. ERAS protocol used.   Access to the abdomen was achieved using a 5 mm 0 laparoscope thru a 5 mm trocar In the left upper Quadrant 2 fingerbreadths below the left subcostal margin using the Optiview technique. Pneumoperitoneum was smoothly established up to 15 mm of mercury. The laparoscope was  advanced and the abdominal cavity was surveilled. The patient was then placed in reverse Trendelenburg.   A 5 mm trocar was placed slightly above and to the left of the umbilicus under direct visualization.  The [facility] liver retractor was placed under the left lobe of the liver through a 5 mm trocar incision site in the subxiphoid position. A 5 mm trocar was placed in the lateral right upper quadrant along with a 15 mm trocar in the mid right abdomen. A final 5 mm trocar was placed in the lateral LUQ.  All under direct visualization after exparel had been infiltrated in bilateral lateral upper abdominal walls as a TAP block for postoperative pain relief  The stomach was inspected. It was completely decompressed and the orogastric tube was removed.  There was no anterior dimple that was obviously visible. Her preop ugi showed no hiatal hernia.   We identified the pylorus and measured 6 cm proximal to the pylorus and identified an area of where we would start taking down the short gastric vessels. Harmonic scalpel was used to take down the short gastric vessels along the greater curvature of the stomach. We were able to enter the lesser sac. We continued to march along the greater curvature of the stomach taking down the short gastrics. As we approached the gastrosplenic ligament we took care in this area not to injure the spleen. We were able to take down the entire gastrosplenic ligament. We then mobilized the fundus away from the left crus of diaphragm. There were a few posterior gastric avascular attachments which were taken down.  This left the stomach completely mobilized. No vessels had been taken down along the lesser curvature of the stomach.  We then reidentified the pylorus. A 40Fr ViSiGi was  then placed in the oropharynx and advanced down into the stomach and placed in the distal antrum and positioned along the lesser curvature. It was placed under suction which secured the 40Fr ViSiGi in place  along the lesser curve. Then using the Ethicon echelon 60 mm stapler with a green load with ethicon staple line reinforcement material, I placed a stapler along the antrum approximately 5 cm from the pylorus. The stapler was angled so that there is ample room at the angularis incisura. I then fired the first staple load after inspecting it posteriorly to ensure adequate space both anteriorly and posteriorly. At this point I still was not completely past the angularis so with another gold load with ethicon staple line reinforcement material, I placed the stapler in position just inside the prior stapleline. We then rotated the stomach to insure that there was adequate anteriorly as well as posteriorly. The stapler was then fired.  At this point I started using 60 mm blue load staple cartridges with ethicon staple line reinforcement material. The echelon stapler was then repositioned with a 60 mm blue load with ethicon staple line reinforcement material and we continued to march up along the [place]. My assistant was holding traction along the greater curvature stomach along the cauterized short gastric vessels ensuring that the stomach was symmetrically retracted. Prior to each firing of the staple, we rotated the stomach to ensure that there is adequate stomach left.  As we approached the fundus, I used 60 mm blue cartridge with ethicon staple line reinforcement material aiming  lateral to the GE junction after mobilizing some of the esophageal fat pad.  The sleeve was inspected. There is no evidence of cork screw. The staple line appeared hemostatic. The CRNA inflated the ViSiGi to the green zone and the upper abdomen was flooded with saline. There were no bubbles. The sleeve was decompressed and the ViSiGi removed. My assistant scrubbed out and performed an upper endoscopy. The sleeve easily distended with air and the scope was easily advanced to the pylorus. There is no evidence of internal bleeding or cork  screwing. There was no narrowing at the angularis. There is no evidence of bubbles. Please see his operative note for further details. The gastric sleeve was decompressed and the endoscope was removed.  The greater curvature the stomach was grasped with a laparoscopic grasper and removed from the 15 mm trocar site.  The liver retractor was removed. I then closed the 15 mm trocar site with 1 interrupted 0 Vicryl sutures through the fascia using the endoclose. The closure was viewed laparoscopically and it was airtight. Remaining Exparel was then infiltrated in the preperitoneal spaces around the trocar sites. Pneumoperitoneum was released. All trocar sites were closed with a 4-0 Monocryl in a subcuticular fashion followed by the application of dermabond. The patient was extubated and taken to the recovery room in stable condition. All needle, instrument, and sponge counts were correct x2. There are no immediate complications  (1) 60 mm green with ethicon staple line reinforcement material (1) 60 mm gold with ethicon staple line reinforcement material (4) 60 mm blue with  ethicon staple line reinforcement material  PLAN OF CARE: Admit to inpatient   PATIENT DISPOSITION:  PACU - hemodynamically stable.   Delay start of Pharmacological VTE agent (>24hrs) due to surgical blood loss or risk of bleeding:  no  Leighton Ruff. Redmond Pulling, MD, FACS FASMBS General, Bariatric, & Minimally Invasive Surgery [facility], [place]

## 2021-05-16 NOTE — H&P (Signed)
CC: here for surgery  Requesting provider: n/a  HPI: Bessy Reaney is an 40 y.o. female who is here for lap sleeve gastrectomy, upper endoscopy. Denies changes since seen in clinic in early November.  Had the flu after that visit. No steroids. Just tamiflu. Has felt well the past week. No fever, chills, n/v.   History-Madoline Morganne Haile is a 40 y.o. female who is seen today for follow-up regarding her obesity and related comorbidities which include hypertension, dyslipidemia, prediabetes, left shoulder pain, right ankle pain and some occasional food related GERD. I initially met her in mid August to discuss bariatric surgery. She has completed the bariatric surgery evaluation program. She has received nutritional and psychological evaluation and clearance..  The only medical change since she was initially seen was that her A1c came back elevated so her doctor put her on Rybelsus. She states that is gone well. She did have a little bit of nausea. She does think it is working. She has lost some weight and she has a decreased appetite. Otherwise she denies any medical changes. No chest pain, chest pressure, shortness of breath, dyspnea on exertion, orthopnea, paroxysmal nocturnal dyspnea, abdominal pain  Past Medical History:  Diagnosis Date   Anxiety    Asthma    Dry eyes    Dry skin    Eczema    Fatigue    Fibroids    GERD (gastroesophageal reflux disease)    H/O echocardiogram    only showed tachycardia   Heartburn    HTN (hypertension)    Hyperlipidemia    Palpitations    Pre-diabetes     pre diabetes, Metformin ordered from weight loss clinic   Rash in adult    Shortness of breath    due to allergies   Stress    Tachycardia    Vitamin D deficiency    Wheezing     Past Surgical History:  Procedure Laterality Date   EYE SURGERY Bilateral 2006   Lasik   HYSTEROSCOPY N/A 05/19/2020   Procedure: HYSTEROSCOPY POLYPECTOMY WITH MYOSURE AND D&C;  Surgeon: Governor Specking, MD;  Location: [facility];  Service: Gynecology;  Laterality: N/A;   MYOMECTOMY N/A 10/30/2017   Procedure: ABDOMINAL MYOMECTOMY;  Surgeon: Lavonia Drafts, MD;  Location: [place] [facility];  Service: Gynecology;  Laterality: N/A;    Family History  Problem Relation Age of Onset   Thyroid disease Mother    Fibroids Mother    Deep vein thrombosis Mother    Cancer Maternal Grandmother        liver    Cancer Maternal Grandfather        pancreatic   Hyperlipidemia Father    Hypertension Father    Sleep apnea Father    Obesity Father    Cancer Paternal Grandfather        prostate    Social:  reports that she has never smoked. She has never used smokeless tobacco. She reports that she does not drink alcohol and does not use drugs.  Allergies: No Known Allergies  Medications: I have reviewed the patient's current medications.   ROS - all of the below systems have been reviewed with the patient and positives are indicated with bold text General: chills, fever or night sweats Eyes: blurry vision or double vision ENT: epistaxis or sore throat Allergy/Immunology: itchy/watery eyes or nasal congestion Hematologic/Lymphatic: bleeding problems, blood clots or swollen lymph nodes Endocrine: temperature intolerance or unexpected weight changes Breast: new or changing breast lumps or nipple discharge  Resp: cough, shortness of breath, or wheezing CV: chest pain or dyspnea on exertion GI: as per HPI GU: dysuria, trouble voiding, or hematuria MSK: joint pain or joint stiffness Neuro: TIA or stroke symptoms Derm: pruritus and skin lesion changes Psych: anxiety and depression  PE Last menstrual period 05/05/2021. Constitutional: NAD; conversant; no deformities Eyes: Moist conjunctiva; no lid lag; anicteric; PERRL Neck: Trachea midline; no thyromegaly Lungs: Normal respiratory effort; no tactile fremitus CV: RRR; no palpable thrills; no pitting edema GI: Abd soft, nt; no palpable  hepatosplenomegaly MSK: Normal gait; no clubbing/cyanosis Psychiatric: Appropriate affect; alert and oriented x3 Lymphatic: No palpable cervical or axillary lymphadenopathy Skin:no rash  No results found for this or any previous visit (from the past 48 hour(s)).  No results found.  Imaging: reviewed  A/P: Caterina Racine is an 40 y.o. female with  Severe obesity (CMS-HCC)  Hypercholesterolemia with hypertriglyceridemia  Essential hypertension  Diabetes mellitus type 2 in obese (CMS-HCC)  Mild intermittent asthma without complication  Vitamin D deficiency  Anxiety and depression  To OR for lap sleeve gastrectomy with upper endoscopy IV abx ERAS protocol  Leighton Ruff. Redmond Pulling, MD, FACS General, Bariatric, & Minimally Invasive Surgery [facility], [place]

## 2021-05-16 NOTE — Anesthesia Postprocedure Evaluation (Signed)
Anesthesia Post Note  Patient: Jeanne [name]  Procedure(s) Performed: LAPAROSCOPIC GASTRIC SLEEVE RESECTION (Abdomen) UPPER GI ENDOSCOPY     Patient location during evaluation: PACU Anesthesia Type: General Level of consciousness: awake and alert Pain management: pain level controlled Vital Signs Assessment: post-procedure vital signs reviewed and stable Respiratory status: spontaneous breathing, nonlabored ventilation and respiratory function stable Cardiovascular status: blood pressure returned to baseline and stable Postop Assessment: no apparent nausea or vomiting Anesthetic complications: no   No notable events documented.  Last Vitals:  Vitals:   05/16/21 1745 05/16/21 1811  BP: (!) 141/95 (!) 145/91  Pulse: 61 63  Resp: 15 16  Temp: 36.5 C 36.6 C  SpO2: 94% 99%    Last Pain:  Vitals:   05/16/21 1811  TempSrc: Oral  PainSc:                  Lidia Collum

## 2021-05-16 NOTE — Progress Notes (Signed)

## 2021-05-17 ENCOUNTER — Other Ambulatory Visit (HOSPITAL_COMMUNITY): Payer: Self-pay

## 2021-05-17 ENCOUNTER — Encounter (HOSPITAL_COMMUNITY): Payer: Self-pay | Admitting: General Surgery

## 2021-05-17 LAB — CBC WITH DIFFERENTIAL/PLATELET
Abs Immature Granulocytes: 0.06 10*3/uL (ref 0.00–0.07)
Basophils Absolute: 0 10*3/uL (ref 0.0–0.1)
Basophils Relative: 0 %
Eosinophils Absolute: 0 10*3/uL (ref 0.0–0.5)
Eosinophils Relative: 0 %
HCT: 43.7 % (ref 36.0–46.0)
Hemoglobin: 14.5 g/dL (ref 12.0–15.0)
Immature Granulocytes: 1 %
Lymphocytes Relative: 13 %
Lymphs Abs: 1.4 10*3/uL (ref 0.7–4.0)
MCH: 31 pg (ref 26.0–34.0)
MCHC: 33.2 g/dL (ref 30.0–36.0)
MCV: 93.6 fL (ref 80.0–100.0)
Monocytes Absolute: 0.4 10*3/uL (ref 0.1–1.0)
Monocytes Relative: 4 %
Neutro Abs: 8.6 10*3/uL — ABNORMAL HIGH (ref 1.7–7.7)
Neutrophils Relative %: 82 %
Platelets: 339 10*3/uL (ref 150–400)
RBC: 4.67 MIL/uL (ref 3.87–5.11)
RDW: 13 % (ref 11.5–15.5)
WBC: 10.5 10*3/uL (ref 4.0–10.5)
nRBC: 0 % (ref 0.0–0.2)

## 2021-05-17 LAB — COMPREHENSIVE METABOLIC PANEL
ALT: 40 U/L (ref 0–44)
AST: 29 U/L (ref 15–41)
Albumin: 4 g/dL (ref 3.5–5.0)
Alkaline Phosphatase: 62 U/L (ref 38–126)
Anion gap: 7 (ref 5–15)
BUN: 10 mg/dL (ref 6–20)
CO2: 24 mmol/L (ref 22–32)
Calcium: 8.9 mg/dL (ref 8.9–10.3)
Chloride: 107 mmol/L (ref 98–111)
Creatinine, Ser: 0.73 mg/dL (ref 0.44–1.00)
GFR, Estimated: >60 mL/min (ref >60)
Glucose, Bld: 151 mg/dL — ABNORMAL HIGH (ref 70–99)
Potassium: 4.6 mmol/L (ref 3.5–5.1)
Sodium: 138 mmol/L (ref 135–145)
Total Bilirubin: 0.6 mg/dL (ref 0.3–1.2)
Total Protein: 7.1 g/dL (ref 6.5–8.1)

## 2021-05-17 LAB — GLUCOSE, CAPILLARY
Glucose-Capillary: 110 mg/dL — ABNORMAL HIGH (ref 70–99)
Glucose-Capillary: 167 mg/dL — ABNORMAL HIGH (ref 70–99)
Glucose-Capillary: 168 mg/dL — ABNORMAL HIGH (ref 70–99)
Glucose-Capillary: 92 mg/dL (ref 70–99)

## 2021-05-17 MED ORDER — ACETAMINOPHEN 500 MG PO TABS: 1000.0000 mg | ORAL_TABLET | Freq: Three times a day (TID) | ORAL | 0 refills | Status: AC

## 2021-05-17 MED ORDER — TRAMADOL HCL 50 MG PO TABS
50.0000 mg | ORAL_TABLET | Freq: Four times a day (QID) | ORAL | 0 refills | Status: DC | PRN
  Filled 2021-05-17: qty 10, 3d supply, fill #0

## 2021-05-17 MED ORDER — PANTOPRAZOLE SODIUM 40 MG PO TBEC
40.0000 mg | DELAYED_RELEASE_TABLET | Freq: Every day | ORAL | 0 refills | Status: DC
  Filled 2021-05-17: qty 90, 90d supply, fill #0

## 2021-05-17 MED ORDER — ONDANSETRON 4 MG PO TBDP
4.0000 mg | ORAL_TABLET | Freq: Four times a day (QID) | ORAL | 0 refills | Status: DC | PRN
  Filled 2021-05-17: qty 18, 4d supply, fill #0

## 2021-05-17 NOTE — Discharge Instructions (Signed)

## 2021-05-17 NOTE — Progress Notes (Signed)
Patient alert and oriented, pain is controlled. Patient is tolerating fluids, advanced to protein shake today, patient is tolerating well.  Reviewed Gastric sleeve discharge instructions with patient and patient is able to articulate understanding.  Provided information on BELT program, Support Group and [facility] outpatient pharmacy. All questions answered, will continue to monitor.  

## 2021-05-17 NOTE — Progress Notes (Signed)
°  Transition of Care [facility]) Screening Note   Patient Details  Name: Jeanne [name] Date of Birth: January 26, 1981   Transition of Care [facility]) CM/SW Contact:    Jeanne Pall, LCSW Phone Number: 05/17/2021, 12:59 PM    Transition of Care Department [facility]) has reviewed patient and no TOC needs have been identified at this time. We will continue to monitor patient advancement through interdisciplinary progression rounds. If new patient transition needs arise, please place a TOC consult.   Jeanne Hunton, LCSW

## 2021-05-17 NOTE — Discharge Summary (Signed)
Physician Discharge Summary  Jeanne [name] NFA:[id number] DOB: 1980/08/03 DOA: 05/16/2021  PCP: Ann Held, DO  Admit date: 05/16/2021 Discharge date: 05/17/2021  Recommendations for Outpatient Follow-up:    Follow-up Information     Greer Pickerel, MD. Go on 06/16/2021.   Specialty: General Surgery Why: at 3:30pm.  Please arrive 15 minutes prior to your appointment time.  Thank you. Contact information: [address] [place] [address] [phone]         Carlena Hurl, PA-C. Go on 07/12/2021.   Specialty: General Surgery Why: at 2:15pm for Dr. Redmond Pulling.  Please arrive 15 minutes prior to your appointment time.  Thank you. Contact information: [address] [place] [address] [phone]                Discharge Diagnoses:  Principal Problem:   S/P laparoscopic sleeve gastrectomy Severe obesity (BMI 56)  Hypercholesterolemia with hypertriglyceridemia  Essential hypertension  Diabetes mellitus type 2 in obese (CMS-HCC)  Mild intermittent asthma without complication  Vitamin D deficiency  Anxiety and depression  Surgical Procedure: Laparoscopic Sleeve Gastrectomy, upper endoscopy  Discharge Condition: Good Disposition: Home  Diet recommendation: Postoperative sleeve gastrectomy diet (liquids only)  Filed Weights   05/16/21 1336 05/16/21 1342  Weight: (!) 139.3 kg (!) 139.3 kg     Hospital Course:  The patient was admitted for a planned laparoscopic sleeve gastrectomy. Please see operative note. Preoperatively the patient was given 5000 units of subcutaneous heparin for DVT prophylaxis. Postoperative prophylactic Lovenox dosing was started on the evening of postoperative day 0. ERAS protocol was used. On the evening of postoperative day 0, the patient was started on water and ice chips. On postoperative day 1 the patient had no fever or tachycardia and was tolerating water in their diet was gradually advanced  throughout the day. The patient was ambulating without difficulty. Their vital signs are stable without fever or tachycardia. Their hemoglobin had remained stable.  The patient had received discharge instructions and counseling. They were deemed stable for discharge and had met discharge criteria  I also discussed dc instructions with pt and family member  BP (!) 123/96 (BP Location: Right Arm)    Pulse 62    Temp 98.4 F (36.9 C) (Oral)    Resp 16    Ht '5\' 2"'  (1.575 m)    Wt (!) 139.3 kg    LMP 05/05/2021 (Exact Date)    SpO2 98%    BMI 56.15 kg/m   Gen: alert, NAD, non-toxic appearing Pupils: equal, no scleral icterus Pulm: Lungs clear to auscultation, symmetric chest rise CV: regular rate and rhythm Abd: soft, mild approp tender, nondistended.  No cellulitis. No incisional hernia Ext: no edema, no calf tenderness Skin: no rash, no jaundice  Discharge Instructions  Discharge Instructions     Ambulate hourly while awake   Complete by: As directed    Call MD for:  difficulty breathing, headache or visual disturbances   Complete by: As directed    Call MD for:  persistant dizziness or light-headedness   Complete by: As directed    Call MD for:  persistant nausea and vomiting   Complete by: As directed    Call MD for:  redness, tenderness, or signs of infection (pain, swelling, redness, odor or green/yellow discharge around incision site)   Complete by: As directed    Call MD for:  severe uncontrolled pain   Complete by: As directed    Call  MD for:  temperature >101 F   Complete by: As directed    Diet bariatric full liquid   Complete by: As directed    Discharge instructions   Complete by: As directed    See bariatric discharge instructions   Incentive spirometry   Complete by: As directed    Perform hourly while awake      Allergies as of 05/17/2021   No Known Allergies      Medication List     STOP taking these medications    oseltamivir 75 MG capsule Commonly  known as: TAMIFLU       TAKE these medications    acetaminophen 500 MG tablet Commonly known as: TYLENOL Take 2 tablets (1,000 mg total) by mouth every 8 (eight) hours for 5 days.   albuterol 108 (90 Base) MCG/ACT inhaler Commonly known as: VENTOLIN HFA TAKE 2 PUFFS BY MOUTH EVERY 6 HOURS AS NEEDED FOR WHEEZE OR SHORTNESS OF BREATH What changed: See the new instructions.   buPROPion 200 MG 12 hr tablet Commonly known as: WELLBUTRIN SR Take 1 tablet (200 mg total) by mouth daily.   fluticasone 50 MCG/ACT nasal spray Commonly known as: FLONASE SPRAY 2 SPRAYS INTO EACH NOSTRIL EVERY DAY   metoprolol succinate 100 MG 24 hr tablet Commonly known as: TOPROL-XL TAKE 1 TABLET BY MOUTH DAILY. TAKE WITH OR IMMEDIATELY FOLLOWING A MEAL. Notes to patient: Monitor Blood Pressure Daily and keep a log for primary care physician.  You may need to make changes to your medications with rapid weight loss.     ondansetron 4 MG disintegrating tablet Commonly known as: ZOFRAN-ODT Take 1 tablet by mouth every 6 hours as needed for nausea or vomiting.   pantoprazole 40 MG tablet Commonly known as: PROTONIX Take 1 tablet by mouth daily.   Qvar RediHaler 40 MCG/ACT inhaler Generic drug: beclomethasone TAKE 2 PUFFS BY MOUTH TWICE A DAY   sertraline 100 MG tablet Commonly known as: ZOLOFT Take 2 tablets (200 mg total) by mouth daily.   traMADol 50 MG tablet Commonly known as: ULTRAM Take 1 tablet by mouth every 6 hours as needed (pain).   triamcinolone cream 0.1 % Commonly known as: KENALOG Apply 1 application topically 2 (two) times daily as needed (for eczema on hands.).        Follow-up Information     Greer Pickerel, MD. Go on 06/16/2021.   Specialty: General Surgery Why: at 3:30pm.  Please arrive 15 minutes prior to your appointment time.  Thank you. Contact information: [address] [place] [address] [phone]         Carlena Hurl, PA-C. Go on 07/12/2021.    Specialty: General Surgery Why: at 2:15pm for Dr. Redmond Pulling.  Please arrive 15 minutes prior to your appointment time.  Thank you. Contact information: [address] [place] [address] [phone]                  The results of significant diagnostics from this hospitalization (including imaging, microbiology, ancillary and laboratory) are listed below for reference.    Significant Diagnostic Studies: No results found.  Labs: Basic Metabolic Panel: Recent Labs  Lab 05/17/21 0443  NA 138  K 4.6  CL 107  CO2 24  GLUCOSE 151*  BUN 10  CREATININE 0.73  CALCIUM 8.9   Liver Function Tests: Recent Labs  Lab 05/17/21 0443  AST 29  ALT 40  ALKPHOS 62  BILITOT 0.6  PROT  7.1  ALBUMIN 4.0    CBC: Recent Labs  Lab 05/16/21 1840 05/17/21 0443  WBC  --  10.5  NEUTROABS  --  8.6*  HGB 14.7 14.5  HCT 44.3 43.7  MCV  --  93.6  PLT  --  339    CBG: Recent Labs  Lab 05/16/21 1923 05/17/21 0058 05/17/21 0408 05/17/21 0731 05/17/21 1132  GLUCAP 148* 167* 168* 110* 92    Principal Problem:   S/P laparoscopic sleeve gastrectomy   Time coordinating discharge: 15 min  Signed:  Gayland Curry, MD [facility], [place] [phone] 05/17/2021, 3:32 PM

## 2021-05-18 ENCOUNTER — Other Ambulatory Visit (HOSPITAL_COMMUNITY): Payer: Self-pay

## 2021-05-18 LAB — SURGICAL PATHOLOGY

## 2021-05-18 NOTE — Progress Notes (Signed)
24hr fluid recall prior to discharge: 726mL.  Per dehydration protocol, will call pt to f/u within one week post op.

## 2021-05-23 ENCOUNTER — Telehealth (HOSPITAL_COMMUNITY): Payer: Self-pay | Admitting: *Deleted

## 2021-05-30 ENCOUNTER — Other Ambulatory Visit: Payer: Self-pay | Admitting: Family Medicine

## 2021-05-31 ENCOUNTER — Other Ambulatory Visit: Payer: Self-pay

## 2021-05-31 ENCOUNTER — Encounter: Payer: BC Managed Care – PPO | Attending: General Surgery | Admitting: Skilled Nursing Facility1

## 2021-06-01 NOTE — Progress Notes (Signed)
2 Week Post-Operative Nutrition Class   Patient was seen on 05/31/2021 for Post-Operative Nutrition education at the [facility].    Surgery date: 05/16/2021 Surgery type: sleeve Start weight at [facility]: 331 Weight today: 297.5 Bowel Habits: Every day to every other day no complaints   Body Composition Scale Date  Current Body Weight 297.5  Total Body Fat % 49.6  Visceral Fat 20  Fat-Free Mass % 50.3   Total Body Water % 39.6  Muscle-Mass lbs 31.4  BMI 54.3  Body Fat Displacement          Torso  lbs 91.7         Left Leg  lbs 18.3         Right Leg  lbs 18.3         Left Arm  lbs 9.1         Right Arm   lbs 9.1   Pt states she is no longer taking metformin or rybelsus.  Pt reports blood sugars in the low 100s   Labs: A1C 6.2    The following the learning objectives were met by the patient during this course: Identifies Phase 3 (Soft, High Proteins) Dietary Goals and will begin from 2 weeks post-operatively to 2 months post-operatively Identifies appropriate sources of fluids and proteins  Identifies appropriate fat sources and healthy verses unhealthy fat types   States protein recommendations and appropriate sources post-operatively Identifies the need for appropriate texture modifications, mastication, and bite sizes when consuming solids Identifies appropriate fat consumption and sources Identifies appropriate multivitamin and calcium sources post-operatively Describes the need for physical activity post-operatively and will follow MD recommendations States when to call healthcare provider regarding medication questions or post-operative complications   Handouts given during class include: Phase 3A: Soft, High Protein Diet Handout Phase 3 High Protein Meals Healthy Fats   Follow-Up Plan: Patient will follow-up at [facility] in 6 weeks for 2 month post-op nutrition visit for diet advancement per MD.

## 2021-06-06 ENCOUNTER — Telehealth: Payer: Self-pay | Admitting: Skilled Nursing Facility1

## 2021-06-06 NOTE — Telephone Encounter (Signed)
RD called pt to verify fluid intake once starting soft, solid proteins 2 week post-bariatric surgery.   Daily Fluid intake:  Daily Protein intake: Bowel Habits:   Concerns/issues:    LVM 

## 2021-06-07 ENCOUNTER — Ambulatory Visit: Payer: BC Managed Care – PPO | Admitting: Family Medicine

## 2021-06-07 ENCOUNTER — Encounter: Payer: Self-pay | Admitting: Family Medicine

## 2021-06-07 VITALS — BP 128/80 | HR 67 | Temp 97.9°F | Resp 18 | Ht 62.0 in | Wt 295.0 lb

## 2021-06-07 DIAGNOSIS — I10 Essential (primary) hypertension: Secondary | ICD-10-CM | POA: Diagnosis not present

## 2021-06-07 DIAGNOSIS — Z3009 Encounter for other general counseling and advice on contraception: Secondary | ICD-10-CM

## 2021-06-07 DIAGNOSIS — R7303 Prediabetes: Secondary | ICD-10-CM

## 2021-06-07 DIAGNOSIS — E118 Type 2 diabetes mellitus with unspecified complications: Secondary | ICD-10-CM | POA: Diagnosis not present

## 2021-06-07 LAB — COMPREHENSIVE METABOLIC PANEL
ALT: 40 U/L — ABNORMAL HIGH (ref 0–35)
AST: 26 U/L (ref 0–37)
Albumin: 4 g/dL (ref 3.5–5.2)
Alkaline Phosphatase: 73 U/L (ref 39–117)
BUN: 14 mg/dL (ref 6–23)
CO2: 25 mEq/L (ref 19–32)
Calcium: 9.3 mg/dL (ref 8.4–10.5)
Chloride: 105 mEq/L (ref 96–112)
Creatinine, Ser: 0.83 mg/dL (ref 0.40–1.20)
GFR: 89.35 mL/min (ref >60.00)
Glucose, Bld: 103 mg/dL — ABNORMAL HIGH (ref 70–99)
Potassium: 3.9 mEq/L (ref 3.5–5.1)
Sodium: 139 mEq/L (ref 135–145)
Total Bilirubin: 0.4 mg/dL (ref 0.2–1.2)
Total Protein: 6.6 g/dL (ref 6.0–8.3)

## 2021-06-07 LAB — LIPID PANEL
Cholesterol: 208 mg/dL — ABNORMAL HIGH (ref 0–200)
HDL: 33.4 mg/dL — ABNORMAL LOW
LDL Cholesterol: 155 mg/dL — ABNORMAL HIGH (ref 0–99)
NonHDL: 174.91
Total CHOL/HDL Ratio: 6
Triglycerides: 99 mg/dL (ref 0.0–149.0)
VLDL: 19.8 mg/dL (ref 0.0–40.0)

## 2021-06-07 LAB — CBC WITH DIFFERENTIAL/PLATELET
Basophils Absolute: 0 10*3/uL (ref 0.0–0.1)
Basophils Relative: 0.7 % (ref 0.0–3.0)
Eosinophils Absolute: 0.3 10*3/uL (ref 0.0–0.7)
Eosinophils Relative: 5.6 % — ABNORMAL HIGH (ref 0.0–5.0)
HCT: 44 % (ref 36.0–46.0)
Hemoglobin: 14.6 g/dL (ref 12.0–15.0)
Lymphocytes Relative: 30.3 % (ref 12.0–46.0)
Lymphs Abs: 1.9 10*3/uL (ref 0.7–4.0)
MCHC: 33.2 g/dL (ref 30.0–36.0)
MCV: 91.5 fl (ref 78.0–100.0)
Monocytes Absolute: 0.4 10*3/uL (ref 0.1–1.0)
Monocytes Relative: 6.4 % (ref 3.0–12.0)
Neutro Abs: 3.5 10*3/uL (ref 1.4–7.7)
Neutrophils Relative %: 57 % (ref 43.0–77.0)
Platelets: 261 10*3/uL (ref 150.0–400.0)
RBC: 4.81 Mil/uL (ref 3.87–5.11)
RDW: 13.7 % (ref 11.5–15.5)
WBC: 6.1 10*3/uL (ref 4.0–10.5)

## 2021-06-07 MED ORDER — NORETHIN ACE-ETH ESTRAD-FE 1-20 MG-MCG(24) PO TABS: 1.0000 | ORAL_TABLET | Freq: Every day | ORAL | 3 refills | Status: DC

## 2021-06-07 NOTE — Assessment & Plan Note (Signed)
S/p gastric sleeve Has lost 15 lbs so far

## 2021-06-07 NOTE — Progress Notes (Signed)
Established Patient Office Visit  Subjective:  Patient ID: Jeanne [name], female    DOB: 1981/05/26  Age: 41 y.o. MRN: [id number]  CC:  Chief Complaint  Patient presents with   Post-op Follow-up    Gastric sleeve    HPI [name] presents for f/u dm and blood work since surgery.  No complaints   Past Medical History:  Diagnosis Date   Anxiety    Asthma    Dry eyes    Dry skin    Eczema    Fatigue    Fibroids    GERD (gastroesophageal reflux disease)    H/O echocardiogram    only showed tachycardia   Heartburn    HTN (hypertension)    Hyperlipidemia    Palpitations    Pre-diabetes     pre diabetes, Metformin ordered from weight loss clinic   Rash in adult    Shortness of breath    due to allergies   Stress    Tachycardia    Vitamin D deficiency    Wheezing     Past Surgical History:  Procedure Laterality Date   EYE SURGERY Bilateral 2006   Lasik   HYSTEROSCOPY N/A 05/19/2020   Procedure: HYSTEROSCOPY POLYPECTOMY WITH MYOSURE AND D&C;  Surgeon: Governor Specking, MD;  Location: [facility];  Service: Gynecology;  Laterality: N/A;   LAPAROSCOPIC GASTRIC SLEEVE RESECTION N/A 05/16/2021   Procedure: LAPAROSCOPIC GASTRIC SLEEVE RESECTION;  Surgeon: Greer Pickerel, MD;  Location: [facility];  Service: General;  Laterality: N/A;   MYOMECTOMY N/A 10/30/2017   Procedure: ABDOMINAL MYOMECTOMY;  Surgeon: Lavonia Drafts, MD;  Location: [facility];  Service: Gynecology;  Laterality: N/A;   UPPER GI ENDOSCOPY N/A 05/16/2021   Procedure: UPPER GI ENDOSCOPY;  Surgeon: Greer Pickerel, MD;  Location: [facility];  Service: General;  Laterality: N/A;    Family History  Problem Relation Age of Onset   Thyroid disease Mother    Fibroids Mother    Deep vein thrombosis Mother    Cancer Maternal Grandmother        liver    Cancer Maternal Grandfather        pancreatic   Hyperlipidemia Father    Hypertension Father    Sleep apnea Father    Obesity Father    Cancer Paternal  Grandfather        prostate    Social History   Socioeconomic History   Marital status: Married    Spouse name: [name]   Number of children: Not on file   Years of education: Not on file   Highest education level: Not on file  Occupational History   Occupation: Product manager: [employer]    Comment: 4 th grade  Tobacco Use   Smoking status: Never   Smokeless tobacco: Never  Vaping Use   Vaping Use: Never used  Substance and Sexual Activity   Alcohol use: No    Alcohol/week: 0.0 standard drinks   Drug use: No   Sexual activity: Yes    Partners: Male    Birth control/protection: Pill  Other Topics Concern   Not on file  Social History Narrative   Exercise--- walk in summer--- its been difficult during school year   Social Determinants of Health   Financial Resource Strain: Not on file  Food Insecurity: Not on file  Transportation Needs: Not on file  Physical Activity: Not on file  Stress: Not on file  Social Connections: Not on file  Intimate Partner  Violence: Not on file    Outpatient Medications Prior to Visit  Medication Sig Dispense Refill   albuterol (VENTOLIN HFA) 108 (90 Base) MCG/ACT inhaler TAKE 2 PUFFS BY MOUTH EVERY 6 HOURS AS NEEDED FOR WHEEZE OR SHORTNESS OF BREATH (Patient taking differently: Inhale 2 puffs into the lungs every 6 (six) hours as needed for wheezing or shortness of breath.) 90 g 1   buPROPion (WELLBUTRIN SR) 200 MG 12 hr tablet Take 1 tablet (200 mg total) by mouth daily. 90 tablet 1   fluticasone (FLONASE) 50 MCG/ACT nasal spray SPRAY 2 SPRAYS INTO EACH NOSTRIL EVERY DAY 48 mL 5   metoprolol succinate (TOPROL-XL) 100 MG 24 hr tablet TAKE 1 TABLET BY MOUTH DAILY. TAKE WITH OR IMMEDIATELY FOLLOWING A MEAL. 90 tablet 1   ondansetron (ZOFRAN-ODT) 4 MG disintegrating tablet Take 1 tablet by mouth every 6 hours as needed for nausea or vomiting. 20 tablet 0   pantoprazole (PROTONIX) 40 MG tablet Take 1 tablet by mouth daily. 90  tablet 0   QVAR REDIHALER 40 MCG/ACT inhaler TAKE 2 PUFFS BY MOUTH TWICE A DAY 10.6 g 5   traMADol (ULTRAM) 50 MG tablet Take 1 tablet by mouth every 6 hours as needed (pain). 10 tablet 0   triamcinolone cream (KENALOG) 0.1 % Apply 1 application topically 2 (two) times daily as needed (for eczema on hands.). 30 g 3   sertraline (ZOLOFT) 100 MG tablet Take 2 tablets (200 mg total) by mouth daily. 180 tablet 3   No facility-administered medications prior to visit.    No Known Allergies  ROS Review of Systems  Constitutional:  Negative for activity change, appetite change, fatigue and unexpected weight change.  Respiratory:  Negative for cough and shortness of breath.   Cardiovascular:  Negative for chest pain and palpitations.  Psychiatric/Behavioral:  Negative for behavioral problems and dysphoric mood. The patient is not nervous/anxious.      Objective:    Physical Exam Vitals and nursing note reviewed.  Constitutional:      Appearance: She is well-developed.  HENT:     Head: Normocephalic and atraumatic.  Eyes:     Conjunctiva/sclera: Conjunctivae normal.  Neck:     Thyroid: No thyromegaly.     Vascular: No carotid bruit or JVD.  Cardiovascular:     Rate and Rhythm: Normal rate and regular rhythm.     Heart sounds: Normal heart sounds. No murmur heard. Pulmonary:     Effort: Pulmonary effort is normal. No respiratory distress.     Breath sounds: Normal breath sounds. No wheezing or rales.  Chest:     Chest wall: No tenderness.  Musculoskeletal:     Cervical back: Normal range of motion and neck supple.  Neurological:     Mental Status: She is alert and oriented to person, place, and time.    BP 128/80 (BP Location: Left Arm, Patient Position: Sitting, Cuff Size: Large)    Pulse 67    Temp 97.9 F (36.6 C) (Oral)    Resp 18    Ht 5\' 2"  (1.575 m)    Wt 295 lb (133.8 kg)    SpO2 97%    BMI 53.96 kg/m  Wt Readings from Last 3 Encounters:  06/07/21 295 lb (133.8 kg)   06/01/21 297 lb 8 oz (134.9 kg)  05/16/21 (!) 307 lb (139.3 kg)     Health Maintenance Due  Topic Date Due   URINE MICROALBUMIN  Never done   Pneumococcal Vaccine 19-64 Years  old (2 - PCV) 11/01/2018   PAP SMEAR-Modifier  06/07/2021    There are no preventive care reminders to display for this patient.  Lab Results  Component Value Date   TSH 2.33 01/11/2021   Lab Results  Component Value Date   WBC 10.5 05/17/2021   HGB 14.5 05/17/2021   HCT 43.7 05/17/2021   MCV 93.6 05/17/2021   PLT 339 05/17/2021   Lab Results  Component Value Date   NA 138 05/17/2021   K 4.6 05/17/2021   CO2 24 05/17/2021   GLUCOSE 151 (H) 05/17/2021   BUN 10 05/17/2021   CREATININE 0.73 05/17/2021   BILITOT 0.6 05/17/2021   ALKPHOS 62 05/17/2021   AST 29 05/17/2021   ALT 40 05/17/2021   PROT 7.1 05/17/2021   ALBUMIN 4.0 05/17/2021   CALCIUM 8.9 05/17/2021   ANIONGAP 7 05/17/2021   GFR 112.07 05/09/2021   Lab Results  Component Value Date   CHOL 215 (H) 05/09/2021   Lab Results  Component Value Date   HDL 35.40 (L) 05/09/2021   Lab Results  Component Value Date   LDLCALC 166 (H) 05/09/2021   Lab Results  Component Value Date   TRIG 68.0 05/09/2021   Lab Results  Component Value Date   CHOLHDL 6 05/09/2021   Lab Results  Component Value Date   HGBA1C 6.2 05/09/2021      Assessment & Plan:   Problem List Items Addressed This Visit       High   Morbid obesity ([place])    S/p gastric sleeve Has lost 15 lbs so far          Unprioritized   HTN (hypertension)    Well controlled, no changes to meds. Encouraged heart healthy diet such as the DASH diet and exercise as tolerated.       Relevant Orders   CBC with Differential/Platelet   Comprehensive metabolic panel   Lipid panel   Prediabetes    Recheck labs today Pt is off rybelsus and metformin       Other Visit Diagnoses     Advised about oral contraception    -  Primary   Relevant Medications    Norethindrone Acetate-Ethinyl Estrad-FE (LOESTRIN 24 FE) 1-20 MG-MCG(24) tablet   Controlled type 2 diabetes mellitus with complication, without long-term current use of insulin (HCC)       Relevant Orders   CBC with Differential/Platelet   Comprehensive metabolic panel   Lipid panel       Meds ordered this encounter  Medications   Norethindrone Acetate-Ethinyl Estrad-FE (LOESTRIN 24 FE) 1-20 MG-MCG(24) tablet    Sig: Take 1 tablet by mouth daily.    Dispense:  3 tablet    Refill:  3    Follow-up: Return in about 4 weeks (around 07/05/2021) for hypertension.    Ann Held, DO

## 2021-06-07 NOTE — Patient Instructions (Signed)

## 2021-06-07 NOTE — Assessment & Plan Note (Signed)
Recheck labs today Pt is off rybelsus and metformin

## 2021-06-07 NOTE — Addendum Note (Signed)
Addended by: Kelle Darting A on: 06/07/2021 11:54 AM   Modules accepted: Orders

## 2021-06-07 NOTE — Assessment & Plan Note (Signed)
Well controlled, no changes to meds. Encouraged heart healthy diet such as the DASH diet and exercise as tolerated.  °

## 2021-06-13 ENCOUNTER — Telehealth: Payer: Self-pay | Admitting: *Deleted

## 2021-06-13 NOTE — Telephone Encounter (Signed)
Transition Care Management Follow-up Telephone Call Date of discharge and from where: 05/17/21 [facility] How have you been since you were released from the hospital? "Doing well" Any questions or concerns? No  Items Reviewed: Did the pt receive and understand the discharge instructions provided? Yes  Medications obtained and verified? Yes  Other? No  Any new allergies since your discharge? No  Dietary orders reviewed? Yes Do you have support at home? Yes   Home Care and Equipment/Supplies: Were home health services ordered? no If so, what is the name of the agency? Not applicable  Has the agency set up a time to come to the patient's home? not applicable Were any new equipment or medical supplies ordered?  No What is the name of the medical supply agency? Not applicable Were you able to get the supplies/equipment? not applicable Do you have any questions related to the use of the equipment or supplies? Not applicable  Functional Questionnaire: (I = Independent and D = Dependent) ADLs: I  Bathing/Dressing- I  Meal Prep- I  Eating- I  Maintaining continence- I  Transferring/Ambulation- I  Managing Meds- I  Follow up appointments reviewed:  PCP Hospital f/u appt confirmed? Yes  Scheduled to see Dr. Etter Sjogren on 06/07/21 @ 9:40 am.- completed [facility] f/u appt confirmed? Yes  Scheduled to see Dr. Redmond Pulling on 06/16/21. Are transportation arrangements needed? No  If their condition worsens, is the pt aware to call PCP or go to the Emergency Dept.? Yes Was the patient provided with contact information for the PCP's office or ED? Yes Was to pt encouraged to call back with questions or concerns? Yes    Kelli Churn RN, CCM, [facility] Care Management Coordinator - Managed [place] High Risk [phone]

## 2021-06-14 ENCOUNTER — Other Ambulatory Visit: Payer: Self-pay | Admitting: Family Medicine

## 2021-06-14 DIAGNOSIS — E785 Hyperlipidemia, unspecified: Secondary | ICD-10-CM

## 2021-06-15 ENCOUNTER — Other Ambulatory Visit: Payer: Self-pay | Admitting: *Deleted

## 2021-06-15 MED ORDER — ROSUVASTATIN CALCIUM 10 MG PO TABS: 10.0000 mg | ORAL_TABLET | Freq: Every day | ORAL | 2 refills | Status: DC

## 2021-06-21 ENCOUNTER — Encounter: Payer: Self-pay | Admitting: Psychiatry

## 2021-06-21 ENCOUNTER — Other Ambulatory Visit: Payer: Self-pay

## 2021-06-21 ENCOUNTER — Ambulatory Visit: Payer: BC Managed Care – PPO | Admitting: Psychiatry

## 2021-06-21 DIAGNOSIS — F411 Generalized anxiety disorder: Secondary | ICD-10-CM | POA: Diagnosis not present

## 2021-06-21 DIAGNOSIS — F3342 Major depressive disorder, recurrent, in full remission: Secondary | ICD-10-CM

## 2021-06-21 NOTE — Progress Notes (Signed)
Jeanne [name] [id number] August 20, 1980 41 y.o.  Subjective:   Patient ID:  Jeanne [name] is a 41 y.o. (DOB [birth year]) female.  Chief Complaint:  Chief Complaint  Patient presents with   Follow-up    History of depression and anxiety    HPI [name] presents to the office today for follow-up of anxiety and depression. She had gastric sleeve surgery on December 12th and surgeon recommended earlier follow-up to re-evaluate response to medications after surgery. She reports that her surgery went well. She reports that she has more energy and sleep has improved. Sleeping about 7 hours a night. She reports that she is now sleeping through the night more often. Motivation has been good. Denies any recent anxiety. Mood has been good. Appetite has been good. She has been decreasing quantity of foods. Has lost 17 lbs since surgery and 42 lbs since October. Concentration has been adequate. Denies anhedonia. Denies SI.   Denies any discontinuation s/s prior to next dose of Sertraline.   Enjoying change in schools.   Past Psychiatric Medication Trials: Sertraline Wellbutrin  GAD-7    Flowsheet Row Office Visit from 04/24/2019 in [facility] at [place] Visit from 07/29/2018 in [place]  Total GAD-7 Score 7 0      PHQ2-9    [place] Office Visit from 08/16/2018 in [facility] at [place] Visit from 07/29/2018 in [place] Office Visit from 09/10/2017 in [place] Office Visit from 03/31/2016 in [place] at [place]  PHQ-2 Total Score 0 0 5 0  PHQ-9 Total Score 0 5 18 --      Flowsheet Row Admission (Discharged) from 05/16/2021 in [place] Testing 60 from 05/12/2021 in [place] ED from 05/04/2021 in [facility] at [place] No Risk No Risk No Risk        Review of Systems:  Review of Systems  Gastrointestinal:  Negative for nausea and vomiting.  Musculoskeletal:  Negative for gait problem.  Neurological:        Had one migraine after returning from hospital.   Psychiatric/Behavioral:         Please refer to HPI   Medications: I have reviewed the patient's current medications.  Current Outpatient Medications  Medication Sig Dispense Refill   albuterol (VENTOLIN HFA) 108 (90 Base) MCG/ACT inhaler TAKE 2 PUFFS BY MOUTH EVERY 6 HOURS AS NEEDED FOR WHEEZE OR SHORTNESS OF BREATH (Patient taking differently: Inhale 2 puffs into the lungs every 6 (six) hours as needed for wheezing or shortness of breath.) 90 g 1   buPROPion (WELLBUTRIN SR) 200 MG 12 hr tablet Take 1 tablet (200 mg total) by mouth daily. 90 tablet 1   Calcium Carbonate-Vitamin D (CALTRATE 600+D PO) Take by mouth.     fluticasone (FLONASE) 50 MCG/ACT nasal spray SPRAY 2 SPRAYS INTO EACH NOSTRIL EVERY DAY 48 mL 5   metoprolol succinate (TOPROL-XL) 100 MG 24 hr tablet TAKE 1 TABLET BY MOUTH DAILY. TAKE WITH OR IMMEDIATELY FOLLOWING A MEAL. 90 tablet 1   Multiple Vitamins-Minerals (BARIATRIC MULTIVITAMINS/IRON PO) Take by mouth.     Norethindrone Acetate-Ethinyl Estrad-FE (LOESTRIN 24 FE) 1-20 MG-MCG(24) tablet Take 1 tablet by mouth daily. 3 tablet 3   pantoprazole (PROTONIX) 40 MG tablet Take 1 tablet by mouth daily. 90 tablet 0   QVAR REDIHALER  40 MCG/ACT inhaler TAKE 2 PUFFS BY MOUTH TWICE A DAY 10.6 g 5   rosuvastatin (CRESTOR) 10 MG tablet Take 1 tablet (10 mg total) by mouth at bedtime. 30 tablet 2   triamcinolone cream (KENALOG) 0.1 % Apply 1 application topically 2 (two) times daily as needed (for eczema on hands.). 30 g 3   ondansetron (ZOFRAN-ODT) 4 MG disintegrating tablet Take 1 tablet by mouth every 6 hours as needed for nausea or vomiting. 20 tablet 0   sertraline (ZOLOFT) 100 MG tablet Take 2 tablets (200 mg  total) by mouth daily. 180 tablet 3   traMADol (ULTRAM) 50 MG tablet Take 1 tablet by mouth every 6 hours as needed (pain). 10 tablet 0   No current facility-administered medications for this visit.    Medication Side Effects: None  Allergies: No Known Allergies  Past Medical History:  Diagnosis Date   Anxiety    Asthma    Dry eyes    Dry skin    Eczema    Fatigue    Fibroids    GERD (gastroesophageal reflux disease)    H/O echocardiogram    only showed tachycardia   Heartburn    HTN (hypertension)    Hyperlipidemia    Palpitations    Pre-diabetes     pre diabetes, Metformin ordered from weight loss clinic   Rash in adult    Shortness of breath    due to allergies   Stress    Tachycardia    Vitamin D deficiency    Wheezing     Past Medical History, Surgical history, Social history, and Family history were reviewed and updated as appropriate.   Please see review of systems for further details on the patient's review from today.   Objective:   Physical Exam:  There were no vitals taken for this visit.  Physical Exam Constitutional:      General: She is not in acute distress. Musculoskeletal:        General: No deformity.  Neurological:     Mental Status: She is alert and oriented to person, place, and time.     Coordination: Coordination normal.  Psychiatric:        Attention and Perception: Attention and perception normal. She does not perceive auditory or visual hallucinations.        Mood and Affect: Mood normal. Mood is not anxious or depressed. Affect is not labile, blunt, angry or inappropriate.        Speech: Speech normal.        Behavior: Behavior normal.        Thought Content: Thought content normal. Thought content is not paranoid or delusional. Thought content does not include homicidal or suicidal ideation. Thought content does not include homicidal or suicidal plan.        Cognition and Memory: Cognition and memory normal.        Judgment:  Judgment normal.     Comments: Insight intact    Lab Review:     Component Value Date/Time   NA 139 06/07/2021 1012   NA 139 12/12/2018 0922   K 3.9 06/07/2021 1012   CL 105 06/07/2021 1012   CO2 25 06/07/2021 1012   GLUCOSE 103 (H) 06/07/2021 1012   BUN 14 06/07/2021 1012   BUN 14 12/12/2018 0922   CREATININE 0.83 06/07/2021 1012   CALCIUM 9.3 06/07/2021 1012   PROT 6.6 06/07/2021 1012   PROT 6.6 12/12/2018 0922   ALBUMIN 4.0 06/07/2021 1012  ALBUMIN 4.4 12/12/2018 0922   AST 26 06/07/2021 1012   ALT 40 (H) 06/07/2021 1012   ALKPHOS 73 06/07/2021 1012   BILITOT 0.4 06/07/2021 1012   BILITOT 0.2 12/12/2018 0922   GFRNONAA >60 05/17/2021 0443   GFRAA >60 04/21/2019 1744       Component Value Date/Time   WBC 6.1 06/07/2021 1012   RBC 4.81 06/07/2021 1012   HGB 14.6 06/07/2021 1012   HGB 14.3 04/25/2018 1318   HCT 44.0 06/07/2021 1012   HCT 43.0 04/25/2018 1318   PLT 261.0 06/07/2021 1012   PLT 363 04/25/2018 1318   MCV 91.5 06/07/2021 1012   MCV 90 04/25/2018 1318   MCH 31.0 05/17/2021 0443   MCHC 33.2 06/07/2021 1012   RDW 13.7 06/07/2021 1012   RDW 12.6 04/25/2018 1318   LYMPHSABS 1.9 06/07/2021 1012   LYMPHSABS 2.3 04/25/2018 1318   MONOABS 0.4 06/07/2021 1012   EOSABS 0.3 06/07/2021 1012   EOSABS 0.3 04/25/2018 1318   BASOSABS 0.0 06/07/2021 1012   BASOSABS 0.1 04/25/2018 1318    No results found for: POCLITH, LITHIUM   No results found for: PHENYTOIN, PHENOBARB, VALPROATE, CBMZ   .res Assessment: Plan:    Discussed continuing current plan of care since she reports that her mood and anxiety s/s have remained well controlled after bariatric surgery. Discussed that bariatric surgery can sometimes affect absorption of medications and cause some discontinuation s/s before next scheduled dose. She denies any discontinuation s/s or breakthrough anxiety or depression. Advised pt to contact office if she notices any of these issues in the future.  Continue  Sertraline 200 mg po qd for anxiety and depression.  Continue Buproprion SR 200 mg daily for depression.  Pt to follow-up as scheduled on 11/22/21 or sooner if clinically indicated.  Patient advised to contact office with any questions, adverse effects, or acute worsening in signs and symptoms.   Ramani was seen today for follow-up.  Diagnoses and all orders for this visit:  Recurrent major depressive disorder, in full remission ([place])  Generalized anxiety disorder     Please see After Visit Summary for patient specific instructions.  Future Appointments  Date Time Provider [place]  07/08/2021  4:00 PM Ann Held, [place] [facility]  07/13/2021  5:00 PM Ruby Cola, RD [place] [facility]  11/22/2021 10:00 AM Thayer Headings, PMHNP CP-CP None    No orders of the defined types were placed in this encounter.   -------------------------------

## 2021-07-08 ENCOUNTER — Ambulatory Visit: Payer: BC Managed Care – PPO | Admitting: Family Medicine

## 2021-07-08 ENCOUNTER — Encounter: Payer: Self-pay | Admitting: Family Medicine

## 2021-07-13 ENCOUNTER — Encounter: Payer: BC Managed Care – PPO | Attending: General Surgery | Admitting: Skilled Nursing Facility1

## 2021-07-13 ENCOUNTER — Other Ambulatory Visit: Payer: Self-pay

## 2021-07-13 NOTE — Progress Notes (Signed)
Bariatric Nutrition Follow-Up Visit Medical Nutrition Therapy    NUTRITION ASSESSMENT    Surgery date: 05/16/2021 Surgery type: sleeve Start weight at [facility]: 331 Weight today: 285.2 pounds  Body Composition Scale Date 07/13/2021  Current Body Weight 297.5 285.2  Total Body Fat % 49.6 49  Visceral Fat 20 19  Fat-Free Mass % 50.3 50.9   Total Body Water % 39.6 39.9  Muscle-Mass lbs 31.4 31  BMI 54.3 52.8  Body Fat Displacement           Torso  lbs 91.7 86.8         Left Leg  lbs 18.3 17.3         Right Leg  lbs 18.3 17.3         Left Arm  lbs 9.1 8.6         Right Arm   lbs 9.1 8.6    Clinical  Medical hx: prediabetes, HTN, anemia, reflux Medications: no longer taking metformin or rybelsus; added crestor Labs: A1C 6.2, vitamin D 28.58, cholesterol 208, HDL 33.40, LDL155, ALT 40 Notable signs/symptoms: some diarrhea  Any previous deficiencies? Vitamin D   Lifestyle & Dietary Hx  Pt states she does not check her blood sugars.   Pt states if she eats too many cashews she will have diarrhea and has been trying to measure them.  Pt states now that she is feeling better she will probably naturally be more active.  Pt states she is struggling remembering to drink throughout the day.   Estimated daily fluid intake: 64 oz Estimated daily protein intake: 70+ g Supplements: multi and calcium Current average weekly physical activity: gym inconsistent; trying to be overall active     24-Hr Dietary Recall First Meal: chicken or meat from dinner Snack:   Second Meal: chicken (frozen lightly breaded) + cheese Snack: nuts  Third Meal: shrimp and beans or frozen chicken cordon blu Snack: sugar free fudgcicle Beverages: water, gatorade zero, diet green tea, coffee + 2% milk and suagr free syrup, black or green tea  Post-Op Goals/ Signs/ Symptoms Using straws: no Drinking while eating: no Chewing/swallowing difficulties: no Changes in vision: no Changes to mood/headaches:  no Hair loss/changes to skin/nails: no Difficulty focusing/concentrating: no Sweating: no Limb weakness: no Dizziness/lightheadedness: no Palpitations: no  Carbonated/caffeinated beverages: no N/V/D/C/Gas: no Abdominal pain: no Dumping syndrome: no    NUTRITION DIAGNOSIS  Overweight/obesity ([place]-3.3) related to past poor dietary habits and physical inactivity as evidenced by completed bariatric surgery and following dietary guidelines for continued weight loss and healthy nutrition status.     NUTRITION INTERVENTION Nutrition counseling (C-1) and education (E-2) to facilitate bariatric surgery goals, including: Diet advancement to the next phase (phase 4) now including non starchy vegetables  The importance of consuming adequate calories as well as certain nutrients daily due to the body's need for essential vitamins, minerals, and fats The importance of daily physical activity and to reach a goal of at least 150 minutes of moderate to vigorous physical activity weekly (or as directed by their physician) due to benefits such as increased musculature and improved lab values The importance of intuitive eating specifically learning hunger-satiety cues and understanding the importance of learning a new body: The importance of mindful eating to avoid grazing behaviors   Goals: -Continue to aim for a minimum of 64 fluid ounces 7 days a week with at least 30 ounces being plain water  -Eat non-starchy vegetables 2 times a day 7 days a week  -  Start out with soft cooked vegetables today and tomorrow; if tolerated begin to eat raw vegetables or cooked including salads  -Eat your 3 ounces of protein first then start in on your non-starchy vegetables; once you understand how much of your meal leads to satisfaction and not full while still eating 3 ounces of protein and non-starchy vegetables you can eat them in any order   -Continue to aim for 30 minutes of activity at least 5 times a week  -Do  NOT cook with/add to your food: alfredo sauce, cheese sauce, barbeque sauce, ketchup, fat back, butter, bacon grease, grease, Crisco, OR SUGAR  -limit nuts to 1/4 cup per day   Handouts Provided Include  Phase 4  Learning Style & Readiness for Change Teaching method utilized: Visual & Auditory  Demonstrated degree of understanding via: Teach Back  Readiness Level: action Barriers to learning/adherence to lifestyle change: learning curve   RD's Notes for Next Visit Assess adherence to pt chosen goals    MONITORING & EVALUATION Dietary intake, weekly physical activity, body weight  Next Steps Patient is to follow-up in May 6 month class

## 2021-07-25 ENCOUNTER — Ambulatory Visit: Payer: BC Managed Care – PPO | Admitting: Family Medicine

## 2021-07-25 NOTE — Progress Notes (Incomplete)
Subjective:   By signing my name below, I, Zite Okoli, attest that this documentation has been prepared under the direction and in the presence of Ann Held, DO. 07/25/2021     Patient ID: Jeanne [name], female    DOB: 1981-04-01, 41 y.o.   MRN: [id number]  No chief complaint on file.   HPI Patient is in today for an office visit and f/u  Past Medical History:  Diagnosis Date   Anxiety    Asthma    Dry eyes    Dry skin    Eczema    Fatigue    Fibroids    GERD (gastroesophageal reflux disease)    H/O echocardiogram    only showed tachycardia   Heartburn    HTN (hypertension)    Hyperlipidemia    Palpitations    Pre-diabetes     pre diabetes, Metformin ordered from weight loss clinic   Rash in adult    Shortness of breath    due to allergies   Stress    Tachycardia    Vitamin D deficiency    Wheezing     Past Surgical History:  Procedure Laterality Date   EYE SURGERY Bilateral 2006   Lasik   HYSTEROSCOPY N/A 05/19/2020   Procedure: HYSTEROSCOPY POLYPECTOMY WITH MYOSURE AND D&C;  Surgeon: Governor Specking, MD;  Location: [facility];  Service: Gynecology;  Laterality: N/A;   LAPAROSCOPIC GASTRIC SLEEVE RESECTION N/A 05/16/2021   Procedure: LAPAROSCOPIC GASTRIC SLEEVE RESECTION;  Surgeon: Greer Pickerel, MD;  Location: [facility];  Service: General;  Laterality: N/A;   MYOMECTOMY N/A 10/30/2017   Procedure: ABDOMINAL MYOMECTOMY;  Surgeon: Lavonia Drafts, MD;  Location: [facility];  Service: Gynecology;  Laterality: N/A;   UPPER GI ENDOSCOPY N/A 05/16/2021   Procedure: UPPER GI ENDOSCOPY;  Surgeon: Greer Pickerel, MD;  Location: [facility];  Service: General;  Laterality: N/A;    Family History  Problem Relation [name] of Onset   Thyroid disease Mother    Fibroids Mother    Deep vein thrombosis Mother    Cancer Maternal Grandmother        liver    Cancer Maternal Grandfather        pancreatic   Hyperlipidemia Father    Hypertension Father    Sleep apnea  Father    Obesity Father    Cancer Paternal Grandfather        prostate    Social History   Socioeconomic History   Marital status: Married    Spouse name: [name]   Number of children: Not on file   Years of education: Not on file   Highest education level: Not on file  Occupational History   Occupation: Product manager: [employer]    Comment: 4 th grade  Tobacco Use   Smoking status: Never   Smokeless tobacco: Never  Vaping Use   Vaping Use: Never used  Substance and Sexual Activity   Alcohol use: No    Alcohol/week: 0.0 standard drinks   Drug use: No   Sexual activity: Yes    Partners: Male    Birth control/protection: Pill  Other Topics Concern   Not on file  Social History Narrative   Exercise--- walk in summer--- its been difficult during school year   Social Determinants of Health   Financial Resource Strain: Not on file  Food Insecurity: Not on file  Transportation Needs: Not on file  Physical Activity: Not on file  Stress: Not  on file  Social Connections: Not on file  Intimate Partner Violence: Not on file    Outpatient Medications Prior to Visit  Medication Sig Dispense Refill   albuterol (VENTOLIN HFA) 108 (90 Base) MCG/ACT inhaler TAKE 2 PUFFS BY MOUTH EVERY 6 HOURS AS NEEDED FOR WHEEZE OR SHORTNESS OF BREATH (Patient taking differently: Inhale 2 puffs into the lungs every 6 (six) hours as needed for wheezing or shortness of breath.) 90 g 1   buPROPion (WELLBUTRIN SR) 200 MG 12 hr tablet Take 1 tablet (200 mg total) by mouth daily. 90 tablet 1   Calcium Carbonate-Vitamin D (CALTRATE 600+D PO) Take by mouth.     fluticasone (FLONASE) 50 MCG/ACT nasal spray SPRAY 2 SPRAYS INTO EACH NOSTRIL EVERY DAY 48 mL 5   metoprolol succinate (TOPROL-XL) 100 MG 24 hr tablet TAKE 1 TABLET BY MOUTH DAILY. TAKE WITH OR IMMEDIATELY FOLLOWING A MEAL. 90 tablet 1   Multiple Vitamins-Minerals (BARIATRIC MULTIVITAMINS/IRON PO) Take by mouth.      Norethindrone Acetate-Ethinyl Estrad-FE (LOESTRIN 24 FE) 1-20 MG-MCG(24) tablet Take 1 tablet by mouth daily. 3 tablet 3   ondansetron (ZOFRAN-ODT) 4 MG disintegrating tablet Take 1 tablet by mouth every 6 hours as needed for nausea or vomiting. 20 tablet 0   pantoprazole (PROTONIX) 40 MG tablet Take 1 tablet by mouth daily. 90 tablet 0   QVAR REDIHALER 40 MCG/ACT inhaler TAKE 2 PUFFS BY MOUTH TWICE A DAY 10.6 g 5   rosuvastatin (CRESTOR) 10 MG tablet Take 1 tablet (10 mg total) by mouth at bedtime. 30 tablet 2   sertraline (ZOLOFT) 100 MG tablet Take 2 tablets (200 mg total) by mouth daily. 180 tablet 3   traMADol (ULTRAM) 50 MG tablet Take 1 tablet by mouth every 6 hours as needed (pain). 10 tablet 0   triamcinolone cream (KENALOG) 0.1 % Apply 1 application topically 2 (two) times daily as needed (for eczema on hands.). 30 g 3   No facility-administered medications prior to visit.    No Known Allergies  Review of Systems  Constitutional:  Negative for fever.  HENT:  Negative for congestion, ear pain, hearing loss, sinus pain and sore throat.   Eyes:  Negative for blurred vision and pain.  Respiratory:  Negative for cough, sputum production, shortness of breath and wheezing.   Cardiovascular:  Negative for chest pain and palpitations.  Gastrointestinal:  Negative for blood in stool, constipation, diarrhea, nausea and vomiting.  Genitourinary:  Negative for dysuria, frequency, hematuria and urgency.  Musculoskeletal:  Negative for back pain, falls and myalgias.  Neurological:  Negative for dizziness, sensory change, loss of consciousness, weakness and headaches.  Endo/Heme/Allergies:  Negative for environmental allergies. Does not bruise/bleed easily.  Psychiatric/Behavioral:  Negative for depression and suicidal ideas. The patient is not nervous/anxious and does not have insomnia.       Objective:    Physical Exam Constitutional:      General: She is not in acute distress.     Appearance: Normal appearance. She is not ill-appearing.  HENT:     Head: Normocephalic and atraumatic.     Right Ear: External ear normal.     Left Ear: External ear normal.  Eyes:     Extraocular Movements: Extraocular movements intact.     Pupils: Pupils are equal, round, and reactive to light.  Cardiovascular:     Rate and Rhythm: Normal rate and regular rhythm.     Pulses: Normal pulses.     Heart sounds: Normal heart  sounds. No murmur heard.   No gallop.  Pulmonary:     Effort: Pulmonary effort is normal. No respiratory distress.     Breath sounds: Normal breath sounds. No wheezing, rhonchi or rales.  Abdominal:     General: Bowel sounds are normal. There is no distension.     Palpations: Abdomen is soft. There is no mass.     Tenderness: There is no abdominal tenderness. There is no guarding or rebound.     Hernia: No hernia is present.  Musculoskeletal:     Cervical back: Normal range of motion and neck supple.  Lymphadenopathy:     Cervical: No cervical adenopathy.  Skin:    General: Skin is warm and dry.  Neurological:     Mental Status: She is alert and oriented to person, place, and time.  Psychiatric:        Behavior: Behavior normal.    There were no vitals taken for this visit. Wt Readings from Last 3 Encounters:  07/13/21 285 lb 3.2 oz (129.4 kg)  06/07/21 295 lb (133.8 kg)  06/01/21 297 lb 8 oz (134.9 kg)    Diabetic Foot Exam - Simple   No data filed    Lab Results  Component Value Date   WBC 6.1 06/07/2021   HGB 14.6 06/07/2021   HCT 44.0 06/07/2021   PLT 261.0 06/07/2021   GLUCOSE 103 (H) 06/07/2021   CHOL 208 (H) 06/07/2021   TRIG 99.0 06/07/2021   HDL 33.40 (L) 06/07/2021   LDLCALC 155 (H) 06/07/2021   ALT 40 (H) 06/07/2021   AST 26 06/07/2021   NA 139 06/07/2021   K 3.9 06/07/2021   CL 105 06/07/2021   CREATININE 0.83 06/07/2021   BUN 14 06/07/2021   CO2 25 06/07/2021   TSH 2.33 01/11/2021   HGBA1C 6.2 05/09/2021    Lab Results   Component Value Date   TSH 2.33 01/11/2021   Lab Results  Component Value Date   WBC 6.1 06/07/2021   HGB 14.6 06/07/2021   HCT 44.0 06/07/2021   MCV 91.5 06/07/2021   PLT 261.0 06/07/2021   Lab Results  Component Value Date   NA 139 06/07/2021   K 3.9 06/07/2021   CO2 25 06/07/2021   GLUCOSE 103 (H) 06/07/2021   BUN 14 06/07/2021   CREATININE 0.83 06/07/2021   BILITOT 0.4 06/07/2021   ALKPHOS 73 06/07/2021   AST 26 06/07/2021   ALT 40 (H) 06/07/2021   PROT 6.6 06/07/2021   ALBUMIN 4.0 06/07/2021   CALCIUM 9.3 06/07/2021   ANIONGAP 7 05/17/2021   GFR 89.35 06/07/2021   Lab Results  Component Value Date   CHOL 208 (H) 06/07/2021   Lab Results  Component Value Date   HDL 33.40 (L) 06/07/2021   Lab Results  Component Value Date   LDLCALC 155 (H) 06/07/2021   Lab Results  Component Value Date   TRIG 99.0 06/07/2021   Lab Results  Component Value Date   CHOLHDL 6 06/07/2021   Lab Results  Component Value Date   HGBA1C 6.2 05/09/2021       Assessment & Plan:   Problem List Items Addressed This Visit   None    No orders of the defined types were placed in this encounter.   I,Zite Okoli,acting as a Education administrator for [employer], DO.,have documented all relevant documentation on the behalf of Ann Held, DO,as directed by  Ann Held, DO while in the  presence of Ann Held, DO.   I, Ann Held, DO., personally preformed the services described in this documentation.  All medical record entries made by the scribe were at my direction and in my presence.  I have reviewed the chart and discharge instructions (if applicable) and agree that the record reflects my personal performance and is accurate and complete. 07/25/2021

## 2021-08-19 ENCOUNTER — Other Ambulatory Visit: Payer: Self-pay | Admitting: Family Medicine

## 2021-08-19 DIAGNOSIS — F3289 Other specified depressive episodes: Secondary | ICD-10-CM

## 2021-09-13 ENCOUNTER — Encounter: Payer: BC Managed Care – PPO | Attending: General Surgery | Admitting: Skilled Nursing Facility1

## 2021-09-13 DIAGNOSIS — R7303 Prediabetes: Secondary | ICD-10-CM | POA: Diagnosis present

## 2021-09-13 DIAGNOSIS — Z6841 Body Mass Index (BMI) 40.0 and over, adult: Secondary | ICD-10-CM | POA: Insufficient documentation

## 2021-09-13 DIAGNOSIS — Z713 Dietary counseling and surveillance: Secondary | ICD-10-CM | POA: Insufficient documentation

## 2021-09-15 NOTE — Progress Notes (Signed)
Follow-up visit:  Post-Operative 09/14/2021 Surgery ? ?Medical Nutrition Therapy:  Appt start time: 6:00pm end time:  7:00pm ? ?Primary concerns today: Post-operative Bariatric Surgery Nutrition Management 6 Month Post-Op Class ? ?Surgery date: 05/16/2021 ?Surgery type: sleeve ?Start weight at [facility]: 331 ?Weight today: 275.6 pounds ? ?Body Composition Scale Date 07/13/2021 09/14/2021  ?Current Body Weight 297.5 285.2 275.6  ?Total Body Fat % 49.6 49 48.1  ?Visceral Fat '20 19 18  '$ ?Fat-Free Mass % 50.3 50.9 51.8  ? Total Body Water % 39.6 39.9 40.4  ?Muscle-Mass lbs 31.4 31 31.2  ?BMI 54.3 52.8 50.3  ?Body Fat Displacement     ?       Torso  lbs 91.7 86.8 82.3  ?       Left Leg  lbs 18.3 17.3 16.4  ?       Right Leg  lbs 18.3 17.3 16.4  ?       Left Arm  lbs 9.1 8.6 8.2  ?       Right Arm   lbs 9.1 8.6 8.2  ? ? ? ?Information Reviewed/ Discussed During Appointment: ?-Review of composition scale numbers ?-Fluid requirements (64-100 ounces) ?-Protein requirements (60-80g) ?-Strategies for tolerating diet ?-Advancement of diet to include Starchy vegetables ?-Barriers to inclusion of new foods ?-Inclusion of appropriate multivitamin and calcium supplements  ?-Exercise recommendations  ? ?Fluid intake: adequate  ? ?Medications: See List ?Supplementation: appropriate  ? ?Using straws: no ?Drinking while eating: no ?Having you been chewing well: yes ?Chewing/swallowing difficulties: no ?Changes in vision: no ?Changes to mood/headaches: no ?Hair loss/Cahnges to skin/Changes to nails: no ?Any difficulty focusing or concentrating: no ?Sweating: no ?Dizziness/Lightheaded: no ?Palpitations: no  ?Carbonated beverages: no ?N/V/D/C/GAS: no ?Abdominal Pain: no ?Dumping syndrome: no ? ?Recent physical activity:  ADL's ? ?Progress Towards Goal(s):  In Progress ?Teaching method utilized: Visual & Auditory  ?Demonstrated degree of understanding via: Teach Back  ?Readiness Level: Action ?Barriers to learning/adherence to lifestyle change:  none identified ? ?Handouts given during visit include: ?Phase V diet Progression  ?Goals Sheet ?The Benefits of Exercise are endless..... ?Support Group Topics ? ?Teaching Method Utilized:  ?Visual ?Auditory ?Hands on ? ?Demonstrated degree of understanding via:  Teach Back  ? ?Monitoring/Evaluation:  Dietary intake, exercise, and body weight. Follow up in 3 months for 9 month post-op visit. ? ?

## 2021-09-16 ENCOUNTER — Other Ambulatory Visit: Payer: Self-pay | Admitting: Family Medicine

## 2021-09-19 ENCOUNTER — Other Ambulatory Visit: Payer: Self-pay | Admitting: Psychiatry

## 2021-09-19 DIAGNOSIS — F3342 Major depressive disorder, recurrent, in full remission: Secondary | ICD-10-CM

## 2021-09-19 DIAGNOSIS — F411 Generalized anxiety disorder: Secondary | ICD-10-CM

## 2021-10-13 ENCOUNTER — Ambulatory Visit (INDEPENDENT_AMBULATORY_CARE_PROVIDER_SITE_OTHER): Payer: BC Managed Care – PPO | Admitting: Family Medicine

## 2021-10-13 ENCOUNTER — Encounter: Payer: Self-pay | Admitting: Family Medicine

## 2021-10-13 VITALS — BP 126/88 | HR 76 | Temp 97.8°F | Resp 18 | Ht 62.0 in | Wt 280.4 lb

## 2021-10-13 DIAGNOSIS — I10 Essential (primary) hypertension: Secondary | ICD-10-CM

## 2021-10-13 DIAGNOSIS — F3289 Other specified depressive episodes: Secondary | ICD-10-CM

## 2021-10-13 DIAGNOSIS — R42 Dizziness and giddiness: Secondary | ICD-10-CM | POA: Insufficient documentation

## 2021-10-13 DIAGNOSIS — R198 Other specified symptoms and signs involving the digestive system and abdomen: Secondary | ICD-10-CM | POA: Diagnosis not present

## 2021-10-13 DIAGNOSIS — F419 Anxiety disorder, unspecified: Secondary | ICD-10-CM | POA: Diagnosis not present

## 2021-10-13 DIAGNOSIS — F3342 Major depressive disorder, recurrent, in full remission: Secondary | ICD-10-CM

## 2021-10-13 DIAGNOSIS — J4520 Mild intermittent asthma, uncomplicated: Secondary | ICD-10-CM | POA: Diagnosis not present

## 2021-10-13 MED ORDER — ALPRAZOLAM 0.25 MG PO TABS
0.2500 mg | ORAL_TABLET | Freq: Two times a day (BID) | ORAL | 0 refills | Status: DC | PRN
Start: 2021-10-13 — End: 2022-02-27

## 2021-10-13 MED ORDER — TRELEGY ELLIPTA 100-62.5-25 MCG/ACT IN AEPB: 1.0000 | INHALATION_SPRAY | Freq: Every day | RESPIRATORY_TRACT | 11 refills | Status: DC

## 2021-10-13 NOTE — Assessment & Plan Note (Signed)
Xanax rx ?For anxiety and teeth ?F/u prm  ?con't counselor  ?

## 2021-10-13 NOTE — Assessment & Plan Note (Signed)
con't with counselor  ?con't zoloft ?

## 2021-10-13 NOTE — Assessment & Plan Note (Signed)
Check labs ?May be related to anxiety  ?

## 2021-10-13 NOTE — Patient Instructions (Signed)

## 2021-10-13 NOTE — Progress Notes (Signed)
? ?Subjective:  ? ?By signing my name below, I, Shehryar Baig, attest that this documentation has been prepared under the direction and in the presence of Ann Held, DO. 10/13/2021 ? ? ? Patient ID: Jeanne [name], female    DOB: 1980-12-15, 41 y.o.   MRN: [id number] ? ?Chief Complaint  ?Patient presents with  ? Hypertension  ?  Pt states not checking blood pressures at home and states having dizzy spells,  ? Follow-up  ? ? ?Hypertension ?Pertinent negatives include no headaches, palpitations or shortness of breath.  ?Patient is in today for a follow up visit.  ? ?She continues having occasional episodes of dizziness. Her spells typically last a couple of minutes. She also notes twitching while sleeping but denies having any panic attacks. She notes having increased stressed and anxiety due to her husband being admitted to a behavioral health facility. She continues taking 200 mg Wellbutrin, 100 mg Zoloft, and is regularly seeing a Social worker. She denies any heart palpitations or SOB, or headaches. She is maintaining her diet and is not fasting. She has a history of a gastric sleeve procedure and has lost 40-50 lb's since. She is checking her blood pressure at home. Her blood pressure is doing well during this visit. She continues taking 100 mg metoprolol succinate daily PO and reports no new issues while taking it.  ?BP Readings from Last 3 Encounters:  ?10/13/21 126/88  ?06/07/21 128/80  ?05/17/21 (!) 123/96  ? ?Pulse Readings from Last 3 Encounters:  ?10/13/21 76  ?06/07/21 67  ?05/17/21 62  ? ?She is requesting an alternative inhaler to her current QVAR redihaler. She notes her insurance is not covering it and it is too expensive. She has no recent episodes of her asthma worsening.  ? ? ?Past Medical History:  ?Diagnosis Date  ? Anxiety   ? Asthma   ? Dry eyes   ? Dry skin   ? Eczema   ? Fatigue   ? Fibroids   ? GERD (gastroesophageal reflux disease)   ? H/O echocardiogram   ? only showed tachycardia   ? Heartburn   ? HTN (hypertension)   ? Hyperlipidemia   ? Palpitations   ? Pre-diabetes   ?  pre diabetes, Metformin ordered from weight loss clinic  ? Rash in adult   ? Shortness of breath   ? due to allergies  ? Stress   ? Tachycardia   ? Vitamin D deficiency   ? Wheezing   ? ? ?Past Surgical History:  ?Procedure Laterality Date  ? EYE SURGERY Bilateral 2006  ? Lasik  ? HYSTEROSCOPY N/A 05/19/2020  ? Procedure: HYSTEROSCOPY POLYPECTOMY WITH MYOSURE AND D&C;  Surgeon: Governor Specking, MD;  Location: [facility];  Service: Gynecology;  Laterality: N/A;  ? LAPAROSCOPIC GASTRIC SLEEVE RESECTION N/A 05/16/2021  ? Procedure: LAPAROSCOPIC GASTRIC SLEEVE RESECTION;  Surgeon: Greer Pickerel, MD;  Location: [facility];  Service: General;  Laterality: N/A;  ? MYOMECTOMY N/A 10/30/2017  ? Procedure: ABDOMINAL MYOMECTOMY;  Surgeon: Lavonia Drafts, MD;  Location: [facility];  Service: Gynecology;  Laterality: N/A;  ? UPPER GI ENDOSCOPY N/A 05/16/2021  ? Procedure: UPPER GI ENDOSCOPY;  Surgeon: Greer Pickerel, MD;  Location: [facility];  Service: General;  Laterality: N/A;  ? ? ?Family History  ?Problem Relation Age of Onset  ? Thyroid disease Mother   ? Fibroids Mother   ? Deep vein thrombosis Mother   ? Cancer Maternal Grandmother   ?     liver   ?  Cancer Maternal Grandfather   ?     pancreatic  ? Hyperlipidemia Father   ? Hypertension Father   ? Sleep apnea Father   ? Obesity Father   ? Cancer Paternal Grandfather   ?     prostate  ? ? ?Social History  ? ?Socioeconomic History  ? Marital status: Married  ?  Spouse name: [name]  ? Number of children: Not on file  ? Years of education: Not on file  ? Highest education level: Not on file  ?Occupational History  ? Occupation: Pharmacist, hospital  ?  Employer: [employer]  ?  Comment: 4 th grade  ?Tobacco Use  ? Smoking status: Never  ? Smokeless tobacco: Never  ?Vaping Use  ? Vaping Use: Never used  ?Substance and Sexual Activity  ? Alcohol use: No  ?  Alcohol/week: 0.0 standard drinks  ?  Drug use: No  ? Sexual activity: Yes  ?  Partners: Male  ?  Birth control/protection: Pill  ?Other Topics Concern  ? Not on file  ?Social History Narrative  ? Exercise--- walk in summer--- its been difficult during school year  ? ?Social Determinants of Health  ? ?Financial Resource Strain: Not on file  ?Food Insecurity: Not on file  ?Transportation Needs: Not on file  ?Physical Activity: Not on file  ?Stress: Not on file  ?Social Connections: Not on file  ?Intimate Partner Violence: Not on file  ? ? ?Outpatient Medications Prior to Visit  ?Medication Sig Dispense Refill  ? albuterol (VENTOLIN HFA) 108 (90 Base) MCG/ACT inhaler TAKE 2 PUFFS BY MOUTH EVERY 6 HOURS AS NEEDED FOR WHEEZE OR SHORTNESS OF BREATH (Patient taking differently: Inhale 2 puffs into the lungs every 6 (six) hours as needed for wheezing or shortness of breath.) 90 g 1  ? Calcium Carbonate-Vitamin D (CALTRATE 600+D PO) Take by mouth.    ? fluticasone (FLONASE) 50 MCG/ACT nasal spray SPRAY 2 SPRAYS INTO EACH NOSTRIL EVERY DAY 48 mL 5  ? metoprolol succinate (TOPROL-XL) 100 MG 24 hr tablet TAKE 1 TABLET BY MOUTH DAILY. TAKE WITH OR IMMEDIATELY FOLLOWING A MEAL. 90 tablet 1  ? Multiple Vitamins-Minerals (BARIATRIC MULTIVITAMINS/IRON PO) Take by mouth.    ? Norethindrone Acetate-Ethinyl Estrad-FE (LOESTRIN 24 FE) 1-20 MG-MCG(24) tablet Take 1 tablet by mouth daily. 3 tablet 3  ? rosuvastatin (CRESTOR) 10 MG tablet TAKE 1 TABLET BY MOUTH EVERYDAY AT BEDTIME 90 tablet 1  ? triamcinolone cream (KENALOG) 0.1 % Apply 1 application topically 2 (two) times daily as needed (for eczema on hands.). 30 g 3  ? QVAR REDIHALER 40 MCG/ACT inhaler TAKE 2 PUFFS BY MOUTH TWICE A DAY 10.6 g 5  ? sertraline (ZOLOFT) 100 MG tablet Take 2 tablets (200 mg total) by mouth daily. 180 tablet 3  ? buPROPion (WELLBUTRIN SR) 200 MG 12 hr tablet TAKE 1 TABLET BY MOUTH EVERY DAY (Patient taking differently: Take 200 mg by mouth daily.) 90 tablet 3  ? buPROPion (WELLBUTRIN SR) 200  MG 12 hr tablet TAKE 1 TABLET BY MOUTH EVERY DAY (Patient not taking: Reported on 10/13/2021) 90 tablet 1  ? ondansetron (ZOFRAN-ODT) 4 MG disintegrating tablet Take 1 tablet by mouth every 6 hours as needed for nausea or vomiting. (Patient not taking: Reported on 10/13/2021) 20 tablet 0  ? pantoprazole (PROTONIX) 40 MG tablet Take 1 tablet by mouth daily. (Patient not taking: Reported on 10/13/2021) 90 tablet 0  ? traMADol (ULTRAM) 50 MG tablet Take 1 tablet by mouth every  6 hours as needed (pain). (Patient not taking: Reported on 10/13/2021) 10 tablet 0  ? ?No facility-administered medications prior to visit.  ? ? ?No Known Allergies ? ?Review of Systems  ?Respiratory:  Negative for shortness of breath.   ?Cardiovascular:  Negative for palpitations.  ?Neurological:  Positive for dizziness. Negative for headaches.  ? ?   ?Objective:  ?  ?Physical Exam ?Constitutional:   ?   General: She is not in acute distress. ?   Appearance: Normal appearance. She is not ill-appearing.  ?HENT:  ?   Head: Normocephalic and atraumatic.  ?   Right Ear: External ear normal.  ?   Left Ear: External ear normal.  ?Eyes:  ?   Extraocular Movements: Extraocular movements intact.  ?   Pupils: Pupils are equal, round, and reactive to light.  ?Cardiovascular:  ?   Rate and Rhythm: Normal rate and regular rhythm.  ?   Heart sounds: Normal heart sounds. No murmur heard. ?  No gallop.  ?Pulmonary:  ?   Effort: Pulmonary effort is normal. No respiratory distress.  ?   Breath sounds: Normal breath sounds. No wheezing or rales.  ?Skin: ?   General: Skin is warm and dry.  ?Neurological:  ?   Mental Status: She is alert and oriented to person, place, and time.  ?Psychiatric:     ?   Judgment: Judgment normal.  ? ? ?BP 126/88 (BP Location: Left Arm, Patient Position: Sitting, Cuff Size: Large)   Pulse 76   Temp 97.8 ?F (36.6 ?C) (Oral)   Resp 18   Ht '5\' 2"'$  (1.575 m)   Wt 280 lb 6.4 oz (127.2 kg)   SpO2 96%   BMI 51.29 kg/m?  ?Wt Readings from  Last 3 Encounters:  ?10/13/21 280 lb 6.4 oz (127.2 kg)  ?09/15/21 275 lb 9.6 oz (125 kg)  ?07/13/21 285 lb 3.2 oz (129.4 kg)  ? ? ?Diabetic Foot Exam - Simple   ?No data filed ?  ? ?Lab Results  ?Component Value

## 2021-10-13 NOTE — Assessment & Plan Note (Signed)
con't zoloft ?Xanax bid prn  ?F/u counselor  ?

## 2021-10-14 LAB — LIPID PANEL
Cholesterol: 172 mg/dL (ref 0–200)
HDL: 54.8 mg/dL (ref >39.00)
LDL Cholesterol: 97 mg/dL (ref 0–99)
NonHDL: 117.15
Total CHOL/HDL Ratio: 3
Triglycerides: 102 mg/dL (ref 0.0–149.0)
VLDL: 20.4 mg/dL (ref 0.0–40.0)

## 2021-10-14 LAB — CBC WITH DIFFERENTIAL/PLATELET
Basophils Absolute: 0.1 10*3/uL (ref 0.0–0.1)
Basophils Relative: 0.6 % (ref 0.0–3.0)
Eosinophils Absolute: 0.3 10*3/uL (ref 0.0–0.7)
Eosinophils Relative: 3.3 % (ref 0.0–5.0)
HCT: 44.5 % (ref 36.0–46.0)
Hemoglobin: 15 g/dL (ref 12.0–15.0)
Lymphocytes Relative: 29.4 % (ref 12.0–46.0)
Lymphs Abs: 2.9 10*3/uL (ref 0.7–4.0)
MCHC: 33.7 g/dL (ref 30.0–36.0)
MCV: 92.1 fl (ref 78.0–100.0)
Monocytes Absolute: 0.6 10*3/uL (ref 0.1–1.0)
Monocytes Relative: 6.2 % (ref 3.0–12.0)
Neutro Abs: 6 10*3/uL (ref 1.4–7.7)
Neutrophils Relative %: 60.5 % (ref 43.0–77.0)
Platelets: 301 10*3/uL (ref 150.0–400.0)
RBC: 4.83 Mil/uL (ref 3.87–5.11)
RDW: 13.3 % (ref 11.5–15.5)
WBC: 9.9 10*3/uL (ref 4.0–10.5)

## 2021-10-14 LAB — VITAMIN D 25 HYDROXY (VIT D DEFICIENCY, FRACTURES): VITD: 30.43 ng/mL (ref 30.00–100.00)

## 2021-10-14 LAB — COMPREHENSIVE METABOLIC PANEL
ALT: 21 U/L (ref 0–35)
AST: 17 U/L (ref 0–37)
Albumin: 4.3 g/dL (ref 3.5–5.2)
Alkaline Phosphatase: 89 U/L (ref 39–117)
BUN: 14 mg/dL (ref 6–23)
CO2: 25 mEq/L (ref 19–32)
Calcium: 9.5 mg/dL (ref 8.4–10.5)
Chloride: 104 mEq/L (ref 96–112)
Creatinine, Ser: 0.64 mg/dL (ref 0.40–1.20)
GFR: 111.73 mL/min (ref >60.00)
Glucose, Bld: 91 mg/dL (ref 70–99)
Potassium: 4.4 mEq/L (ref 3.5–5.1)
Sodium: 138 mEq/L (ref 135–145)
Total Bilirubin: 0.4 mg/dL (ref 0.2–1.2)
Total Protein: 6.8 g/dL (ref 6.0–8.3)

## 2021-10-14 LAB — TSH: TSH: 1.25 u[IU]/mL (ref 0.35–5.50)

## 2021-10-14 LAB — VITAMIN B12: Vitamin B-12: 247 pg/mL (ref 211–911)

## 2021-10-19 ENCOUNTER — Other Ambulatory Visit: Payer: Self-pay

## 2021-10-19 MED ORDER — VITAMIN D (ERGOCALCIFEROL) 1.25 MG (50000 UNIT) PO CAPS: 50000.0000 [IU] | ORAL_CAPSULE | ORAL | 1 refills | Status: DC

## 2021-10-21 ENCOUNTER — Emergency Department
Admission: RE | Admit: 2021-10-21 | Discharge: 2021-10-21 | Disposition: A | Discharge status: Home / Self Care | Payer: BC Managed Care – PPO | Source: Ambulatory Visit

## 2021-10-21 VITALS — BP 111/80 | HR 84 | Temp 97.8°F | Resp 18 | Ht 62.0 in | Wt 275.0 lb

## 2021-10-21 DIAGNOSIS — J029 Acute pharyngitis, unspecified: Secondary | ICD-10-CM | POA: Diagnosis not present

## 2021-10-21 LAB — POCT RAPID STREP A (OFFICE): Rapid Strep A Screen: NEGATIVE

## 2021-10-21 MED ORDER — AZITHROMYCIN 250 MG PO TABS: 250.0000 mg | ORAL_TABLET | Freq: Every day | ORAL | 0 refills | Status: DC

## 2021-10-21 NOTE — Discharge Instructions (Addendum)
Advised patient we will follow-up with throat culture results once received.  Advised patient if throat culture results are positive or symptoms worsen over the next 5 days may start Zithromax.  Advised once starting this antibiotic please take to completion.  Encouraged patient to increase daily water intake while taking this medication.

## 2021-10-21 NOTE — ED Provider Notes (Signed)
Jeanne [name] CARE    CSN: [id number] Arrival date & time: 10/21/21  0957      History   Chief Complaint Chief Complaint  Patient presents with   Sore Throat    Possibly exposed to strep throat recently - Entered by patient   Appointment    HPI Jeanne [name] is a 41 y.o. female.   HPI 41 year old female presents with sore throat x1 day and difficulty swallowing, reports exposed to strep throat.  PMH significant for morbid obesity, asthma, and fatigue.  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   Preventative health care 01/11/2021   Vitamin D deficiency, unspecified 01/11/2021   Insomnia 08/17/2018   Anxiety 07/23/2018   Major depression 07/23/2018   Prediabetes 06/10/2018   Vitamin D deficiency 06/10/2018   Class 3 severe obesity with serious comorbidity and body mass index (BMI) of 50.0 to 59.9 in adult [facility]) 06/10/2018   Fibroids 10/30/2017   S/P myomectomy 10/30/2017   Morbid obesity ([place]) 08/17/2017   Eczema of both hands 07/03/2017   Sinus tachycardia 07/03/2017   Asthma 12/15/2016   HTN (hypertension) 01/25/2016   Eczema 03/04/2015    Past Surgical History:  Procedure Laterality Date   EYE SURGERY Bilateral 2006   Lasik   HYSTEROSCOPY N/A 05/19/2020   Procedure: HYSTEROSCOPY POLYPECTOMY WITH MYOSURE AND D&C;  Surgeon: Governor Specking, MD;  Location:  [facility];  Service: Gynecology;  Laterality: N/A;   LAPAROSCOPIC GASTRIC SLEEVE RESECTION N/A 05/16/2021   Procedure: LAPAROSCOPIC GASTRIC SLEEVE RESECTION;  Surgeon: Greer Pickerel, MD;  Location: [facility];  Service: General;  Laterality: N/A;   MYOMECTOMY N/A 10/30/2017   Procedure: ABDOMINAL MYOMECTOMY;  Surgeon: Lavonia Drafts, MD;  Location: [facility];  Service: Gynecology;  Laterality: N/A;   UPPER GI ENDOSCOPY N/A 05/16/2021   Procedure: UPPER GI ENDOSCOPY;  Surgeon: Greer Pickerel, MD;  Location: [facility];  Service: General;  Laterality: N/A;    OB History     Gravida  0   Para  0   Term  0   Preterm  0   AB  0   Living  0      SAB  0   IAB  0   Ectopic  0   Multiple  0   Live Births  0            Home Medications    Prior to Admission medications   Medication Sig Start Date End Date Taking? Authorizing Provider  albuterol (VENTOLIN HFA) 108 (90 Base) MCG/ACT inhaler TAKE 2 PUFFS BY MOUTH EVERY 6 HOURS AS NEEDED FOR WHEEZE OR SHORTNESS OF BREATH Patient taking differently: Inhale 2 puffs into the  lungs every 6 (six) hours as needed for wheezing or shortness of breath. 11/20/19  Yes Ann Held, DO  ALPRAZolam (XANAX) 0.25 MG tablet Take 1 tablet (0.25 mg total) by mouth 2 (two) times daily as needed for anxiety. 10/13/21  Yes Roma Schanz R, DO  azithromycin (ZITHROMAX) 250 MG tablet Take 1 tablet (250 mg total) by mouth daily. Take first 2 tablets together, then 1 every day until finished. 10/21/21  Yes Eliezer Lofts, FNP  buPROPion HCl (WELLBUTRIN PO) Take by mouth.   Yes [provider]  Calcium Carbonate-Vitamin D (CALTRATE 600+D PO) Take by mouth.   Yes [provider]  fluticasone (FLONASE) 50 MCG/ACT nasal spray SPRAY 2 SPRAYS INTO EACH NOSTRIL EVERY DAY 08/18/20  Yes Roma Schanz R, DO  Fluticasone-Umeclidin-Vilant (TRELEGY ELLIPTA) 100-62.5-25 MCG/ACT AEPB Inhale 1 puff into the lungs daily. 10/13/21  Yes Roma Schanz R, DO  metoprolol succinate (TOPROL-XL) 100 MG 24 hr tablet TAKE 1 TABLET BY MOUTH DAILY. TAKE WITH OR IMMEDIATELY FOLLOWING A MEAL. 05/02/21  Yes Roma Schanz R, DO  Multiple Vitamins-Minerals (BARIATRIC MULTIVITAMINS/IRON PO) Take by mouth.   Yes [provider]  Norethindrone Acetate-Ethinyl Estrad-FE (LOESTRIN 24 FE) 1-20 MG-MCG(24) tablet Take 1 tablet by mouth daily. 06/07/21  Yes Roma Schanz R, DO  rosuvastatin (CRESTOR) 10 MG tablet TAKE 1 TABLET BY MOUTH EVERYDAY AT BEDTIME 09/16/21  Yes Roma Schanz R, DO  Sertraline HCl (ZOLOFT PO) Take by mouth.   Yes [provider]  triamcinolone cream (KENALOG) 0.1 % Apply 1 application topically 2 (two) times daily as needed (for eczema on hands.). 06/07/18  Yes Ann Held, DO  Vitamin D, Ergocalciferol, (DRISDOL) 1.25 MG (50000 UNIT) CAPS capsule Take 1 capsule (50,000 Units total) by mouth every 7 (seven) days. 10/19/21  Yes Roma Schanz R, DO  sertraline (ZOLOFT) 100 MG tablet Take 2 tablets (200 mg total) by mouth daily. 11/22/20 05/16/21  Thayer Headings, PMHNP    Family History Family History  Problem Relation Age of Onset   Thyroid disease Mother    Fibroids Mother    Deep vein thrombosis Mother    Cancer Maternal Grandmother        liver    Cancer Maternal Grandfather        pancreatic   Hyperlipidemia Father    Hypertension Father    Sleep apnea Father    Obesity Father    Cancer Paternal Grandfather        prostate    Social History Social History   Tobacco Use   Smoking status: Never   Smokeless tobacco: Never  Vaping Use   Vaping Use: Never used  Substance Use Topics   Alcohol use: No    Alcohol/week: 0.0 standard drinks   Drug use: No     Allergies   Patient has no known allergies.   Review of Systems Review of Systems  HENT:  Positive for sore throat.   All other systems reviewed and are negative.   Physical Exam Triage Vital Signs ED  Triage Vitals  Enc Vitals Group     BP 10/21/21 1007 111/80     Pulse Rate 10/21/21 1007 84     Resp 10/21/21 1007 18     Temp 10/21/21 1007 97.8 F (36.6 C)     Temp Source 10/21/21 1007 Oral     SpO2 10/21/21 1007 96 %     Weight 10/21/21 1009 275  lb (124.7 kg)     Height 10/21/21 1009 '5\' 2"'$  (1.575 m)     Head Circumference --      Peak Flow --      Pain Score 10/21/21 1008 2     Pain Loc --      Pain Edu? --      Excl. in [place]? --    No data found.  Updated Vital Signs BP 111/80 (BP Location: Right Arm)   Pulse 84   Temp 97.8 F (36.6 C) (Oral)   Resp 18   Ht '5\' 2"'$  (1.575 m)   Wt 275 lb (124.7 kg)   LMP 09/21/2021   SpO2 96%   BMI 50.30 kg/m   Physical Exam Vitals and nursing note reviewed.  Constitutional:      Appearance: She is obese.  HENT:     Head: Normocephalic and atraumatic.     Right Ear: Tympanic membrane, ear canal and external ear normal.     Left Ear: Tympanic membrane, ear canal and external ear normal.     Nose: Nose normal.     Mouth/Throat:     Mouth: Mucous membranes are moist.     Pharynx: Oropharynx is clear. Uvula midline.  Eyes:     Conjunctiva/sclera: Conjunctivae normal.     Pupils: Pupils are equal, round, and reactive to light.  Cardiovascular:     Rate and Rhythm: Normal rate and regular rhythm.     Pulses: Normal pulses.     Heart sounds: Normal heart sounds. No murmur heard. Pulmonary:     Effort: Pulmonary effort is normal.     Breath sounds: Normal breath sounds. No wheezing, rhonchi or rales.  Musculoskeletal:     Cervical back: Normal range of motion and neck supple.  Skin:    General: Skin is warm and dry.  Neurological:     General: No focal deficit present.     Mental Status: She is alert and oriented to person, place, and time.     UC Treatments / Results  Labs (all labs ordered are listed, but only abnormal results are displayed) Labs Reviewed  CULTURE, GROUP A STREP ([place])  CULTURE, GROUP A STREP  POCT  RAPID STREP A (OFFICE)    EKG   Radiology No results found.  Procedures Procedures (including critical care time)  Medications Ordered in UC Medications - No data to display  Initial Impression / Assessment and Plan / UC Course  I have reviewed the triage vital signs and the nursing notes.  Pertinent labs & imaging results that were available during my care of the patient were reviewed by me and considered in my medical decision making (see chart for details).     MDM: 1.  Pharyngitis-throat culture ordered, Rx'd Zithromax. Advised patient we will follow-up with throat culture results once received.  Advised patient if throat culture results are positive or symptoms worsen over the next 5 days may start Zithromax.  Advised once starting this antibiotic please take to completion.  Encouraged patient to increase daily water intake while taking this medication.  Patient discharged home, hemodynamically stable.   Final Clinical Impressions(s) / UC Diagnoses   Final diagnoses:  Pharyngitis, unspecified etiology     Discharge Instructions      Advised patient we will follow-up with throat culture results once received.  Advised patient if throat culture results are positive or symptoms worsen over the next 5 days may start Zithromax.  Advised once starting this antibiotic please  take to completion.  Encouraged patient to increase daily water intake while taking this medication.     ED Prescriptions     Medication Sig Dispense Auth. Provider   azithromycin (ZITHROMAX) 250 MG tablet Take 1 tablet (250 mg total) by mouth daily. Take first 2 tablets together, then 1 every day until finished. 6 tablet Eliezer Lofts, FNP      PDMP not reviewed this encounter.   Eliezer Lofts, [place] 10/21/21 1049

## 2021-10-21 NOTE — ED Triage Notes (Signed)
Patient c/o sore throat x 1 day, difficulty swallowing, exposed to strep throat.  Patient denies any OTC meds.

## 2021-10-23 LAB — CULTURE, GROUP A STREP
MICRO NUMBER:: 13420438
SPECIMEN QUALITY:: ADEQUATE

## 2021-11-22 ENCOUNTER — Encounter: Payer: Self-pay | Admitting: Psychiatry

## 2021-11-22 ENCOUNTER — Ambulatory Visit: Payer: BC Managed Care – PPO | Admitting: Psychiatry

## 2021-11-22 DIAGNOSIS — F411 Generalized anxiety disorder: Secondary | ICD-10-CM | POA: Diagnosis not present

## 2021-11-22 DIAGNOSIS — F3342 Major depressive disorder, recurrent, in full remission: Secondary | ICD-10-CM

## 2021-11-22 MED ORDER — SERTRALINE HCL 100 MG PO TABS
200.0000 mg | ORAL_TABLET | Freq: Every day | ORAL | 1 refills | Status: DC
Start: 2021-11-22 — End: 2022-05-24

## 2021-11-22 NOTE — Progress Notes (Signed)
Jeanne [name] [id number] 02/28/1981 41 y.o.  Subjective:   Patient ID:  Jeanne [name] is a 41 y.o. (DOB Jul 17, 1980) female.  Chief Complaint: No chief complaint on file.   HPI Jeanne [name] presents to the office today for follow-up of *** She reports that she has been doing well overall. Denies depressed mood. She reports that she has had some anxiety in response to husband's mental health issues. She reports some worry about husband. Denies panic. Denies obsessive thoughts or compulsions. Denies irritability. Sleeping well. Appetite has been good. She reports that she has continued to gradually lose weight since gastric sleeve surgery. Energy and motivation have been good. Concentration has been good. Denies anhedonia. Denies SI.   Has not seen a therapist in awhile.   Reports school year went well and plans to return to the same school next year.   Enjoys travel, reading, and listening to podcasts.   Reports taking Xanax prn and has not needed it since school ended.   Past Psychiatric Medication Trials: Sertraline Wellbutrin Xanax  GAD-7    Flowsheet Row Office Visit from 04/24/2019 in [facility] [place] Visit from 07/29/2018 in [place]  Total GAD-7 Score 7 0      PHQ2-9    [place] Office Visit from 08/16/2018 in [facility] [place] Visit from 07/29/2018 in [place] Office Visit from 09/10/2017 in [place] Office Visit from 03/31/2016 in [place] [facility] [place]  PHQ-2 Total Score 0 0 5 0  PHQ-9 Total Score 0 5 18 --      Flowsheet Row ED from 10/21/2021 in [facility] at [facility] Admission (Discharged) from 05/16/2021 in [place] Testing 60 from 05/12/2021 in [place] No Risk No  Risk No Risk        Review of Systems:  Review of Systems  Gastrointestinal:        Recent GI s/s after possible food poison over the weekend.   Musculoskeletal:  Negative for gait problem.  Neurological:  Negative for tremors.  Psychiatric/Behavioral:         Please refer to HPI    Medications: I have reviewed the patient's current medications.  Current Outpatient Medications  Medication Sig Dispense Refill  . ALPRAZolam (XANAX) 0.25 MG tablet Take 1 tablet (0.25 mg total) by mouth 2 (two) times daily as needed for anxiety. 20 tablet 0  . buPROPion (WELLBUTRIN SR) 200 MG 12 hr tablet Take 200 mg by mouth every morning.    . Calcium Carbonate-Vitamin D (CALTRATE 600+D PO) Take by mouth.    . fluticasone (FLONASE) 50 MCG/ACT nasal spray SPRAY 2 SPRAYS INTO EACH NOSTRIL EVERY DAY 48 mL 5  . Fluticasone-Umeclidin-Vilant (TRELEGY ELLIPTA) 100-62.5-25 MCG/ACT AEPB Inhale 1 puff into the lungs daily. 1 each 11  . metoprolol succinate (TOPROL-XL) 100 MG 24 hr tablet TAKE 1 TABLET BY MOUTH DAILY. TAKE WITH OR IMMEDIATELY FOLLOWING A MEAL. 90 tablet 1  . Multiple Vitamins-Minerals (BARIATRIC MULTIVITAMINS/IRON PO) Take by mouth.    . Norethindrone Acetate-Ethinyl Estrad-FE (LOESTRIN 24 FE) 1-20 MG-MCG(24) tablet Take 1 tablet by mouth daily. 3 tablet 3  . ondansetron (ZOFRAN-ODT) 4 MG disintegrating tablet Take by mouth.    . rosuvastatin (CRESTOR) 10 MG tablet TAKE 1 TABLET BY MOUTH EVERYDAY AT BEDTIME 90 tablet 1  .  triamcinolone cream (KENALOG) 0.1 % Apply 1 application topically 2 (two) times daily as needed (for eczema on hands.). 30 g 3  . Vitamin D, Ergocalciferol, (DRISDOL) 1.25 MG (50000 UNIT) CAPS capsule Take 1 capsule (50,000 Units total) by mouth every 7 (seven) days. 12 capsule 1  . albuterol (VENTOLIN HFA) 108 (90 Base) MCG/ACT inhaler TAKE 2 PUFFS BY MOUTH EVERY 6 HOURS AS NEEDED FOR WHEEZE OR SHORTNESS OF BREATH (Patient taking differently: Inhale 2 puffs into the lungs every 6  (six) hours as needed for wheezing or shortness of breath.) 90 g 1  . pantoprazole (PROTONIX) 40 MG tablet Take 40 mg by mouth daily.    . sertraline (ZOLOFT) 100 MG tablet Take 2 tablets (200 mg total) by mouth daily. 180 tablet 1   No current facility-administered medications for this visit.    Medication Side Effects: None  Allergies: No Known Allergies  Past Medical History:  Diagnosis Date  . Anxiety   . Asthma   . Dry eyes   . Dry skin   . Eczema   . Fatigue   . Fibroids   . GERD (gastroesophageal reflux disease)   . H/O echocardiogram    only showed tachycardia  . Heartburn   . HTN (hypertension)   . Hyperlipidemia   . Palpitations   . Pre-diabetes     pre diabetes, Metformin ordered from weight loss clinic  . Rash in adult   . Shortness of breath    due to allergies  . Stress   . Tachycardia   . Vitamin D deficiency   . Wheezing     Past Medical History, Surgical history, Social history, and Family history were reviewed and updated as appropriate.   Please see review of systems for further details on the patient's review from today.   Objective:   Physical Exam:  There were no vitals taken for this visit.  Physical Exam  Lab Review:     Component Value Date/Time   NA 138 10/13/2021 1537   NA 139 12/12/2018 0922   K 4.4 10/13/2021 1537   CL 104 10/13/2021 1537   CO2 25 10/13/2021 1537   GLUCOSE 91 10/13/2021 1537   BUN 14 10/13/2021 1537   BUN 14 12/12/2018 0922   CREATININE 0.64 10/13/2021 1537   CALCIUM 9.5 10/13/2021 1537   PROT 6.8 10/13/2021 1537   PROT 6.6 12/12/2018 0922   ALBUMIN 4.3 10/13/2021 1537   ALBUMIN 4.4 12/12/2018 0922   AST 17 10/13/2021 1537   ALT 21 10/13/2021 1537   ALKPHOS 89 10/13/2021 1537   BILITOT 0.4 10/13/2021 1537   BILITOT 0.2 12/12/2018 0922   GFRNONAA >60 05/17/2021 0443   GFRAA >60 04/21/2019 1744       Component Value Date/Time   WBC 9.9 10/13/2021 1537   RBC 4.83 10/13/2021 1537   HGB 15.0  10/13/2021 1537   HGB 14.3 04/25/2018 1318   HCT 44.5 10/13/2021 1537   HCT 43.0 04/25/2018 1318   PLT 301.0 10/13/2021 1537   PLT 363 04/25/2018 1318   MCV 92.1 10/13/2021 1537   MCV 90 04/25/2018 1318   MCH 31.0 05/17/2021 0443   MCHC 33.7 10/13/2021 1537   RDW 13.3 10/13/2021 1537   RDW 12.6 04/25/2018 1318   LYMPHSABS 2.9 10/13/2021 1537   LYMPHSABS 2.3 04/25/2018 1318   MONOABS 0.6 10/13/2021 1537   EOSABS 0.3 10/13/2021 1537   EOSABS 0.3 04/25/2018 1318   BASOSABS 0.1 10/13/2021 1537   BASOSABS 0.1  04/25/2018 1318    No results found for: "POCLITH", "LITHIUM"   No results found for: "PHENYTOIN", "PHENOBARB", "VALPROATE", "CBMZ"   .res Assessment: Plan:   Pt seen for 25 minutes and time spent discussing treatment options for anxiety to include potential benefits, risks, and side effects of Buspar. She reports that she prefers not to start Buspar at this time since she attributes recent increased anxiety to psychosocial stressors. She reports that she has reached out to a therapist about starting individual therapy and agreed that this would likely be beneficial. Encouraged her to reach out if she needs a referral to a therapist.   Diagnoses and all orders for this visit:  Recurrent major depressive disorder, in full remission ([place]) -     sertraline (ZOLOFT) 100 MG tablet; Take 2 tablets (200 mg total) by mouth daily.  Generalized anxiety disorder -     sertraline (ZOLOFT) 100 MG tablet; Take 2 tablets (200 mg total) by mouth daily.     Please see After Visit Summary for patient specific instructions.  Future Appointments  Date Time Provider [place]  12/08/2021 11:15 AM Sc[place], Francesco Sor, RD [place] [facility]    No orders of the defined types were placed in this encounter.   -------------------------------

## 2021-11-23 ENCOUNTER — Other Ambulatory Visit: Payer: Self-pay | Admitting: General Surgery

## 2021-11-23 ENCOUNTER — Ambulatory Visit (INDEPENDENT_AMBULATORY_CARE_PROVIDER_SITE_OTHER): Payer: BC Managed Care – PPO

## 2021-11-23 VITALS — BP 106/66 | HR 78 | Temp 98.0°F | Resp 18 | Ht 62.0 in | Wt 276.4 lb

## 2021-11-23 DIAGNOSIS — E860 Dehydration: Secondary | ICD-10-CM

## 2021-11-23 MED ORDER — SODIUM CHLORIDE 0.9 % IV BOLUS
1000.0000 mL | Freq: Once | INTRAVENOUS | Status: AC
  Administered 2021-11-23: 1000 mL via INTRAVENOUS

## 2021-11-23 NOTE — Progress Notes (Signed)
Diagnosis: Dehydration  Provider:  Marshell Garfinkel, MD  Procedure: Infusion  IV Type: Peripheral, IV Location: R Antecubital  Normal Saline, Dose: 1000 ml  Infusion Start Time: 1505  Infusion Stop Time: 1515 pm  Post Infusion IV Care: Peripheral IV Discontinued  Discharge: Condition: Good, Destination: Home . AVS provided to patient.   Performed by:  Adelina Mings, LPN

## 2021-12-07 ENCOUNTER — Other Ambulatory Visit: Payer: Self-pay | Admitting: Family Medicine

## 2021-12-07 DIAGNOSIS — R002 Palpitations: Secondary | ICD-10-CM

## 2021-12-08 ENCOUNTER — Ambulatory Visit: Payer: BC Managed Care – PPO | Admitting: Skilled Nursing Facility1

## 2021-12-15 ENCOUNTER — Encounter: Payer: Self-pay | Admitting: Skilled Nursing Facility1

## 2021-12-15 ENCOUNTER — Encounter: Payer: BC Managed Care – PPO | Attending: General Surgery | Admitting: Skilled Nursing Facility1

## 2021-12-15 DIAGNOSIS — R7303 Prediabetes: Secondary | ICD-10-CM | POA: Diagnosis not present

## 2021-12-15 DIAGNOSIS — E669 Obesity, unspecified: Secondary | ICD-10-CM

## 2021-12-15 DIAGNOSIS — Z713 Dietary counseling and surveillance: Secondary | ICD-10-CM | POA: Insufficient documentation

## 2021-12-15 NOTE — Progress Notes (Addendum)
Follow-up visit:  Post-Operative 09/14/2021 Surgery   Surgery date: 05/16/2021 Surgery type: sleeve Start weight at [facility]: 331 Weight today: 280.7 pounds  Body Composition Scale Date 07/13/2021 09/14/2021 12/15/2021  Current Body Weight 297.5 285.2 275.6 280.7  Total Body Fat % 49.6 49 48.1 48.5  Visceral Fat '20 19 18 18  '$ Fat-Free Mass % 50.3 50.9 51.8 51.4   Total Body Water % 39.6 39.9 40.4 40.2  Muscle-Mass lbs 31.4 31 31.2 31.2  BMI 54.3 52.8 50.3 51.2  Body Fat Displacement             Torso  lbs 91.7 86.8 82.3 84.6         Left Leg  lbs 18.3 17.3 16.4 16.9         Right Leg  lbs 18.3 17.3 16.4 16.9         Left Arm  lbs 9.1 8.6 8.2 8.4         Right Arm   lbs 9.1 8.6 8.2 8.4     Clinical  Medical hx: prediabetes, HTN, anemia, reflux Medications: no longer taking metformin or rybelsus; added crestor Labs: A1C 6.2 (needs to be updated), glucose 145, b12 247 (was advised she needs to take the appropriate multivitamin) Notable signs/symptoms: some diarrhea  Any previous deficiencies? Vitamin D  Pt states she has not lost weight in 3 months stating she realizes she got into some not so good habits. Pt states she did start back with a therapist. Pt states she is realizing she is a stress eater.  Pt states she did realize she was using food to bring herself down from work.  Pt states she has been using food to deal with a lot of personal stuff in her life. Pt states she feels things are going better now that her husband has gotten treatment.  Pt states she was just in [place] for vacation and had a nice time.  Pt states she did have to go to the ED last month for dehydration from vomiting.    24 hr recall: her goals as she is not comfortable recanting her typically eaten foods at this time; eating every 3 hours  Breakfast: eating when first waking up; waking about 7-8am Snack: eating Lunch: eating  Snack: eating  Dinner: eating  Snack: eating; stopping about 10pm in bed  around 12am  Beverages: water, diet soda, sugar free drinks, diet green tea  Fluid intake: 64+  Medications: See List Supplementation: not taking multivitamin and calcium   Using straws: no Drinking while eating: no Having you been chewing well: yes Chewing/swallowing difficulties: no Changes in vision: no Changes to mood/headaches: no Hair loss/Cahnges to skin/Changes to nails: no Any difficulty focusing or concentrating: no Sweating: no Dizziness/Lightheaded: no Palpitations: no  Carbonated beverages: no N/V/D/C/GAS: no Abdominal Pain: no Dumping syndrome: no  Recent physical activity:  ADL's  Progress Towards Goal(s):  In Progress Teaching method utilized: Environmental health practitioner & Auditory  Demonstrated degree of understanding via: Teach Back  Readiness Level: contemplative Barriers to learning/adherence to lifestyle change: emotional eating   Teaching Method Utilized:  Visual Auditory Hands on  Demonstrated degree of understanding via:  Teach Back   Monitoring/Evaluation:  Dietary intake, exercise, and body weight. Follow up in 2 months

## 2022-01-01 ENCOUNTER — Encounter: Payer: Self-pay | Admitting: Family Medicine

## 2022-01-02 ENCOUNTER — Ambulatory Visit: Payer: BC Managed Care – PPO | Admitting: Family Medicine

## 2022-01-05 ENCOUNTER — Ambulatory Visit (INDEPENDENT_AMBULATORY_CARE_PROVIDER_SITE_OTHER): Payer: BC Managed Care – PPO | Admitting: Family Medicine

## 2022-01-05 ENCOUNTER — Other Ambulatory Visit (HOSPITAL_BASED_OUTPATIENT_CLINIC_OR_DEPARTMENT_OTHER): Payer: Self-pay

## 2022-01-05 ENCOUNTER — Ambulatory Visit (HOSPITAL_BASED_OUTPATIENT_CLINIC_OR_DEPARTMENT_OTHER)
Admission: RE | Admit: 2022-01-05 | Discharge: 2022-01-05 | Disposition: A | Discharge status: Home / Self Care | Payer: BC Managed Care – PPO | Source: Ambulatory Visit | Attending: Family Medicine | Admitting: Family Medicine

## 2022-01-05 ENCOUNTER — Encounter: Payer: Self-pay | Admitting: Family Medicine

## 2022-01-05 VITALS — BP 126/84 | HR 71 | Temp 98.2°F | Resp 18 | Ht 62.0 in | Wt 284.2 lb

## 2022-01-05 DIAGNOSIS — G8929 Other chronic pain: Secondary | ICD-10-CM

## 2022-01-05 DIAGNOSIS — M545 Low back pain, unspecified: Secondary | ICD-10-CM | POA: Diagnosis present

## 2022-01-05 MED ORDER — WEGOVY 0.25 MG/0.5ML ~~LOC~~ SOAJ: 0.2500 mg | SUBCUTANEOUS | 0 refills | Status: DC

## 2022-01-05 NOTE — Progress Notes (Signed)
Established Patient Office Visit  Subjective   Patient ID: Jeanne [name], female    DOB: March 06, 1981  Age: 41 y.o. MRN: [id number]  Chief Complaint  Patient presents with   Tailbone Pain    Pain since June. Pt reports no falls or injury.     HPI Pt is here c/o tailbone pain since June.   She did fall in April but the pain did not start until June.   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   Preventative health care 01/11/2021   Vitamin D deficiency, unspecified 01/11/2021   Insomnia 08/17/2018   Anxiety 07/23/2018   Major depression 07/23/2018   Prediabetes 06/10/2018   Vitamin D deficiency 06/10/2018   Class 3 severe obesity with serious comorbidity and body mass index (BMI) of 50.0 to 59.9 in adult [facility]) 06/10/2018   Fibroids 10/30/2017   S/P myomectomy 10/30/2017   Eczema of both hands 07/03/2017   Sinus tachycardia 07/03/2017   Asthma 12/15/2016   HTN (hypertension) 01/25/2016   Eczema 03/04/2015   Past Medical History:  Diagnosis Date   Anxiety    Asthma    Dry eyes    Dry skin    Eczema    Fatigue    Fibroids    GERD (gastroesophageal reflux disease)    H/O echocardiogram    only showed tachycardia   Heartburn    HTN (hypertension)    Hyperlipidemia    Palpitations    Pre-diabetes     pre diabetes, Metformin ordered from weight loss clinic   Rash in adult    Shortness of breath    due to allergies   Stress    Tachycardia    Vitamin D deficiency    Wheezing    Past Surgical History:  Procedure Laterality Date   EYE SURGERY Bilateral 2006   Lasik   HYSTEROSCOPY N/A 05/19/2020   Procedure: HYSTEROSCOPY POLYPECTOMY WITH MYOSURE AND D&C;  Surgeon: Governor Specking, MD;  Location: [facility];  Service:  Gynecology;  Laterality: N/A;   LAPAROSCOPIC GASTRIC SLEEVE RESECTION N/A 05/16/2021   Procedure: LAPAROSCOPIC GASTRIC SLEEVE RESECTION;  Surgeon: Greer Pickerel, MD;  Location: [facility];  Service: General;  Laterality: N/A;   MYOMECTOMY N/A 10/30/2017   Procedure: ABDOMINAL MYOMECTOMY;  Surgeon: Lavonia Drafts, MD;  Location: [facility];  Service: Gynecology;  Laterality: N/A;   UPPER GI ENDOSCOPY N/A 05/16/2021   Procedure: UPPER GI ENDOSCOPY;  Surgeon: Greer Pickerel, MD;  Location: [facility];  Service: General;  Laterality: N/A;   Social History   Tobacco Use   Smoking status: Never   Smokeless tobacco: Never  Vaping Use   Vaping Use: Never used  Substance Use Topics   Alcohol use: No    Alcohol/week: 0.0 standard drinks of alcohol   Drug use: No   Social History   Socioeconomic History   Marital status: Married    Spouse name: [name]   Number of children: Not on file   Years of education: Not on file   Highest education level: Not on file  Occupational History   Occupation: Product manager: [employer]    Comment: 4 th grade  Tobacco Use   Smoking status: Never  Smokeless tobacco: Never  Vaping Use   Vaping Use: Never used  Substance and Sexual Activity   Alcohol use: No    Alcohol/week: 0.0 standard drinks of alcohol   Drug use: No   Sexual activity: Yes    Partners: Male    Birth control/protection: Pill  Other Topics Concern   Not on file  Social History Narrative   Exercise--- walk in summer--- its been difficult during school year   Social Determinants of Health   Financial Resource Strain: Not on file  Food Insecurity: Not on file  Transportation Needs: Not on file  Physical Activity: Not on file  Stress: Not on file  Social Connections: Not on file  Intimate Partner Violence: Not on file   Family Status  Relation Name Status   Mother  Alive   MGM  Deceased   MGF  Deceased   Father  Alive   PGM  Alive   PGF  (Not Specified)    Family History  Problem Relation Age of Onset   Thyroid disease Mother    Fibroids Mother    Deep vein thrombosis Mother    Cancer Maternal Grandmother        liver    Cancer Maternal Grandfather        pancreatic   Hyperlipidemia Father    Hypertension Father    Sleep apnea Father    Obesity Father    Cancer Paternal Grandfather        prostate   No Known Allergies    Review of Systems  Constitutional:  Negative for fever and malaise/fatigue.  HENT:  Negative for congestion.   Eyes:  Negative for blurred vision.  Respiratory:  Negative for cough and shortness of breath.   Cardiovascular:  Negative for chest pain, palpitations and leg swelling.  Gastrointestinal:  Negative for vomiting.  Musculoskeletal:  Negative for back pain.  Skin:  Negative for rash.  Neurological:  Negative for loss of consciousness and headaches.      Objective:     BP 126/84 (BP Location: Left Arm, Patient Position: Sitting, Cuff Size: Large)   Pulse 71   Temp 98.2 F (36.8 C) (Oral)   Resp 18   Ht '5\' 2"'$  (1.575 m)   Wt 284 lb 3.2 oz (128.9 kg)   SpO2 96%   BMI 51.98 kg/m    Physical Exam Vitals and nursing note reviewed.  Constitutional:      Appearance: She is well-developed.  HENT:     Head: Normocephalic and atraumatic.  Eyes:     Conjunctiva/sclera: Conjunctivae normal.  Neck:     Thyroid: No thyromegaly.     Vascular: No carotid bruit or JVD.  Cardiovascular:     Rate and Rhythm: Normal rate and regular rhythm.     Heart sounds: Normal heart sounds. No murmur heard. Pulmonary:     Effort: Pulmonary effort is normal. No respiratory distress.     Breath sounds: Normal breath sounds. No wheezing or rales.  Chest:     Chest wall: No tenderness.  Musculoskeletal:        General: No swelling, tenderness, deformity or signs of injury.     Cervical back: Normal range of motion and neck supple.     Right lower leg: No edema.     Left lower leg: No edema.  Neurological:      General: No focal deficit present.     Mental Status: She is alert and oriented to person, place, and  time.     Cranial Nerves: No cranial nerve deficit.     Sensory: No sensory deficit.     Motor: No weakness.     Coordination: Coordination normal.     Gait: Gait normal.     Deep Tendon Reflexes: Reflexes normal.      No results found for any visits on 01/05/22.    The ASCVD Risk score (Arnett DK, et al., 2019) failed to calculate for the following reasons:   The 2019 ASCVD risk score is only valid for ages 85 to 98    Assessment & Plan:   Problem List Items Addressed This Visit       High   Morbid obesity ([place])   Relevant Medications   Semaglutide-Weight Management (WEGOVY) 0.25 MG/0.5ML SOAJ     Unprioritized   Chronic midline low back pain without sciatica - Primary   Relevant Orders   DG Lumbar Spine Complete   DG Sacrum/Coccyx    Return if symptoms worsen or fail to improve.    Ann Held, DO

## 2022-01-05 NOTE — Patient Instructions (Signed)
Tailbone Injury  The tailbone is the small bone at the lower end of the spine. The tailbone is also called the coccyx.A tailbone injury may involve stretched ligaments, bruising, or a broken bone/break (fracture). Tailbone injuries can be painful, and some may take a long time to heal. What are the causes? This condition may be caused by: Falling and landing on the tailbone. Repeated strain or friction from sitting for long periods of time. This may include actions such as rowing or bicycling. Childbirth. In some cases, the cause may not be known. What are the signs or symptoms? Symptoms of this condition include: Pain in the tailbone area or lower back, especially when sitting. Pain or difficulty when standing up from a sitting position. Bruising or swelling in the tailbone area. Painful bowel movements. In females, pain during sex. How is this diagnosed? This condition may be diagnosed based on your symptoms and a physical exam. If your health care provider suspects a fracture, you may have additional tests, such as: X-rays. CT scan. MRI. How is this treated? Most tailbone injuries heal on their own in 4-6 weeks. However, recovery time may be longer if there is a fracture. If treatment is needed, it may include: NSAIDs or other over-the-counter medicines to help relieve your pain. Rubber or inflated ring or cushion to take pressure off the tailbone when sitting. Physical therapy. Injection with local anesthesia and steroid medicine. This is done only if the pain does not improve over time with over-the-counter pain medicines. Follow these instructions at home: Activity Rest as told by your health care provider. Do not sit for a long time without moving. Get up to take short walks every 1-2 hours. This will improve blood flow and breathing. Ask for help if you feel weak or unsteady. Wear appropriate padding and sports gear when bicycling or rowing. This will prevent repeating an  injury that is caused by strain or friction. Do exercises as told by your health care provider. Increase your activity as the pain allows. Return to your normal activities as told by your health care provider. Ask your health care provider what activities are safe for you. Managing pain, stiffness, and swelling To help decrease discomfort when sitting: Sit on your rubber or inflated ring or cushion as told by your health care provider. Lean forward when you sit. If directed, put ice on the injured area. To do this: Put ice in a plastic bag. Place a towel between your skin and the bag. Leave the ice on for 20 minutes, 2-3 times per day for the first 1-2 days. If your skin turns bright red, remove the ice right away to prevent skin damage. The risk of skin damage is higher if you cannot feel pain, heat, or cold. If directed, apply heat to the affected area as often as told by your health care provider. Use the heat source that your health care provider recommends, such as a moist heat pack or a heating pad. Place a towel between your skin and the heat source. Leave the heat on for 20-30 minutes. If your skin turns bright red, remove the heat right away to prevent burns. The risk of burns is higher if you cannot feel pain, heat, or cold. General instructions Take over-the-counter and prescription medicines only as told by your health care provider. Your condition or medicine may cause constipation or painful bowel movements. To prevent or treat constipation, you may need to: Drink enough fluid to keep your urine pale yellow.   Take over-the-counter or prescription medicines. Eat foods that are high in fiber, such as beans, whole grains, and fresh fruits and vegetables. Limit foods that are high in fat and processed sugars, such as fried or sweet foods. Contact a health care provider if: Your pain becomes worse or is not controlled with medicine. Your bowel movements cause a great deal of  discomfort. You are unable to have a bowel movement after 4 days. You have pain during sex. Summary A tailbone injury may involve stretched ligaments, bruising, or a broken bone/break (fracture). Tailbone injuries can be painful. Most heal on their own in 4-6 weeks. Treatment may include taking NSAIDs, doing physical therapy, or using a rubber or inflated ring or cushion when sitting. Follow any recommendations from your health care provider to prevent or treat constipation. This information is not intended to replace advice given to you by your health care provider. Make sure you discuss any questions you have with your health care provider. Document Revised: 08/30/2021 Document Reviewed: 08/30/2021 Elsevier Patient Education  2023 [employer]  

## 2022-01-11 ENCOUNTER — Encounter (INDEPENDENT_AMBULATORY_CARE_PROVIDER_SITE_OTHER): Payer: Self-pay

## 2022-01-11 ENCOUNTER — Encounter: Payer: Self-pay | Admitting: Family Medicine

## 2022-01-12 ENCOUNTER — Telehealth: Payer: Self-pay

## 2022-01-12 NOTE — Telephone Encounter (Signed)
PA initiated via Covermymeds; KEY: [id number]. Awaiting determination.

## 2022-01-16 NOTE — Telephone Encounter (Signed)
PA approved. Effective 01/12/22 to 08/13/22.

## 2022-02-16 ENCOUNTER — Ambulatory Visit: Payer: BC Managed Care – PPO | Admitting: Skilled Nursing Facility1

## 2022-02-26 ENCOUNTER — Other Ambulatory Visit: Payer: Self-pay | Admitting: Family Medicine

## 2022-02-26 DIAGNOSIS — F419 Anxiety disorder, unspecified: Secondary | ICD-10-CM

## 2022-02-26 DIAGNOSIS — R198 Other specified symptoms and signs involving the digestive system and abdomen: Secondary | ICD-10-CM

## 2022-02-27 NOTE — Telephone Encounter (Signed)
Requesting: alprazolam 0.'25mg'$   Contract: None UDS: None Last Visit: 01/05/22 Next Visit: None Last Refill: 10/13/21 #20 and 0RF  Please Advise

## 2022-03-05 ENCOUNTER — Other Ambulatory Visit: Payer: Self-pay | Admitting: Family Medicine

## 2022-03-30 ENCOUNTER — Other Ambulatory Visit (HOSPITAL_COMMUNITY): Payer: Self-pay

## 2022-03-30 MED ORDER — WEGOVY 1.7 MG/0.75ML ~~LOC~~ SOAJ
1.7000 mg | SUBCUTANEOUS | 2 refills | Status: DC
  Filled 2022-03-30: qty 3, 28d supply, fill #0
  Filled 2022-04-23: qty 3, 28d supply, fill #1
  Filled 2022-05-21: qty 3, 28d supply, fill #2

## 2022-03-31 ENCOUNTER — Other Ambulatory Visit (HOSPITAL_COMMUNITY): Payer: Self-pay

## 2022-04-01 ENCOUNTER — Other Ambulatory Visit: Payer: Self-pay | Admitting: Family Medicine

## 2022-04-01 DIAGNOSIS — R198 Other specified symptoms and signs involving the digestive system and abdomen: Secondary | ICD-10-CM

## 2022-04-01 DIAGNOSIS — F419 Anxiety disorder, unspecified: Secondary | ICD-10-CM

## 2022-04-03 NOTE — Telephone Encounter (Signed)
Requesting: alprazolam 0.'25mg'$   Contract: None UDS: None Last Visit: 01/05/22 Next Visit: None Last Refill: 02/27/22 #20 and 0RF   Please Advise

## 2022-04-16 IMAGING — RF DG UGI W SINGLE CM
12 of 14 series · 12 of 24 positions shown · non-contrast
Comparison: None.

CLINICAL DATA: Severe obesity, preop for Roux-en-Y gastric bypass

EXAM:
UPPER GI SERIES WITH KUB
TECHNIQUE: After obtaining a scout radiograph a routine upper GI series was
performed using thin barium
FLUOROSCOPY TIME:  Fluoroscopy Time:  1 minutes 54 seconds
Radiation Exposure Index (if provided by the fluoroscopic device):
76.3 mGy
Number of Acquired Spot Images: 1

[Series 3: cp_standard · 0.35mm/px · 1 of 70 frames shown (1 of 12)]
[frame 11/70]
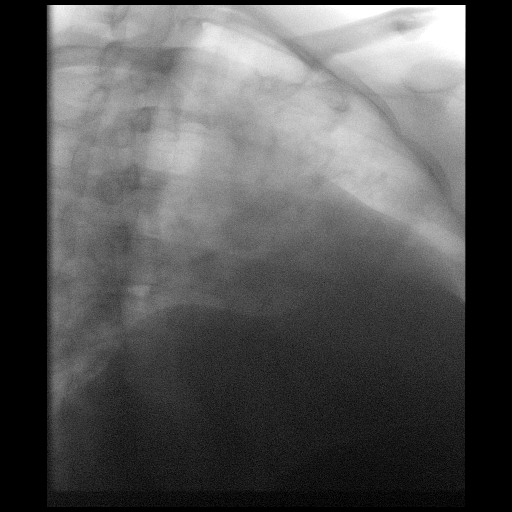

[Series 4: cp_standard · 0.35mm/px · 1 of 77 frames shown (2 of 12)]
[frame 12/77]
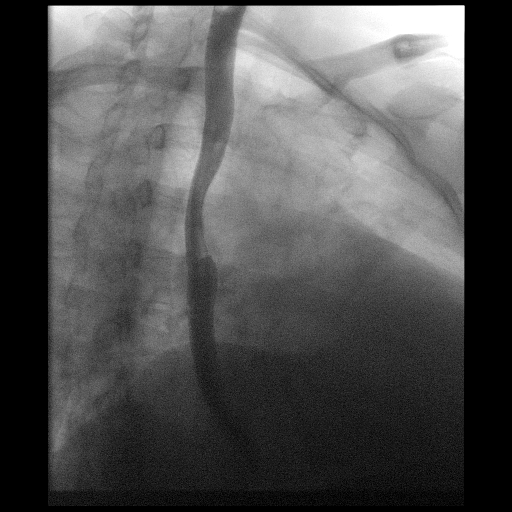

[Series 5: cp_standard · 0.34mm/px · 1 of 4 frames shown (3 of 12)]
[frame 4/4]
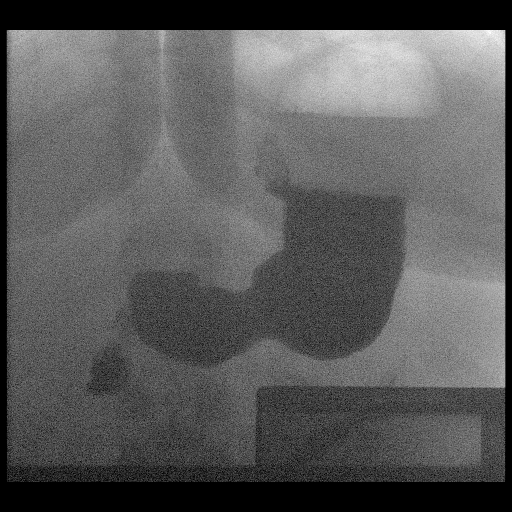

[Series 6: cp_standard · 0.34mm/px · 1 of 15 frames shown (4 of 12)]
[frame 13/15]
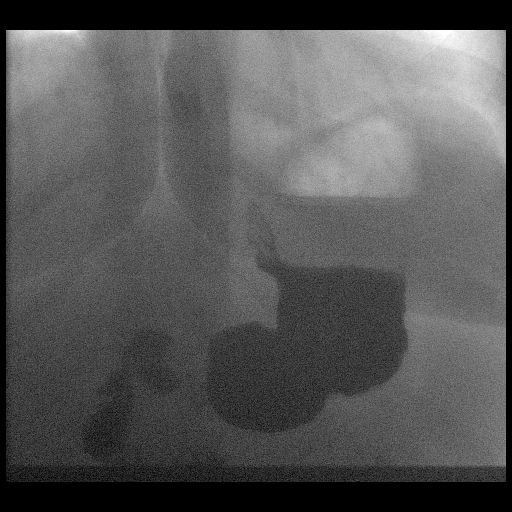

[Series 8: cp_standard · 0.34mm/px · 1 of 32 frames shown (5 of 12)]
[frame 5/32]
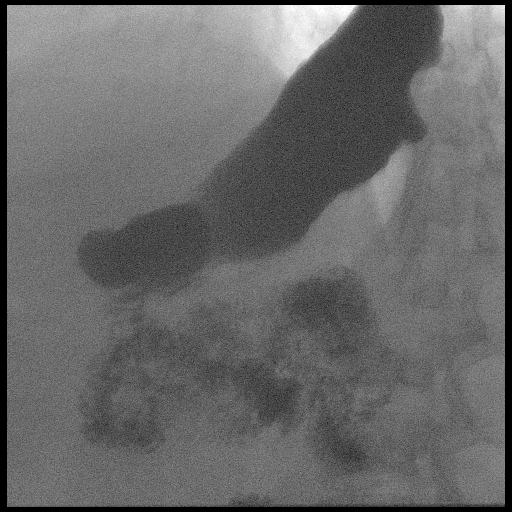

[Series 9: cp_standard · 0.34mm/px · 1 of 20 frames shown (6 of 12)]
[frame 4/20]
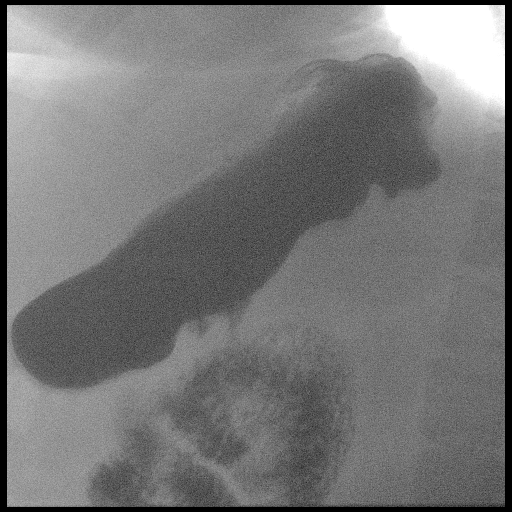

[Series 10: cp_standard · 0.34mm/px · 1 of 94 frames shown (7 of 12)]
[frame 48/94]
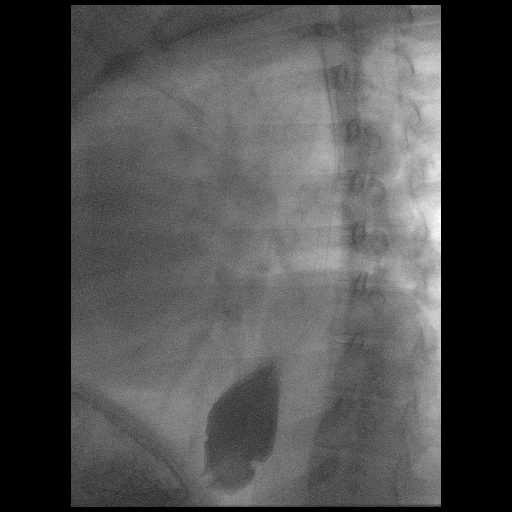

[Series 11: cp_standard · 0.34mm/px · 1 of 65 frames shown (8 of 12)]
[frame 33/65]
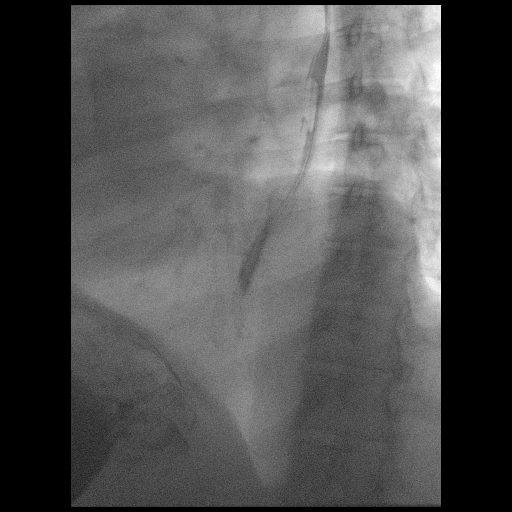

[Series 12: cp_standard · 0.34mm/px · 1 of 17 frames shown (9 of 12)]
[frame 9/17]
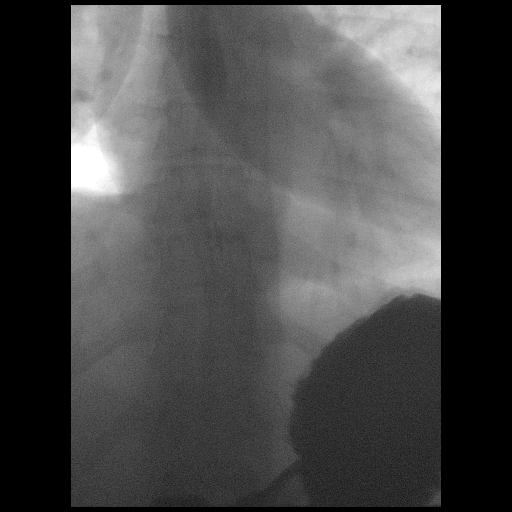

[Series 13: cp_standard · 0.34mm/px · 1 of 59 frames shown (10 of 12)]
[frame 51/59]
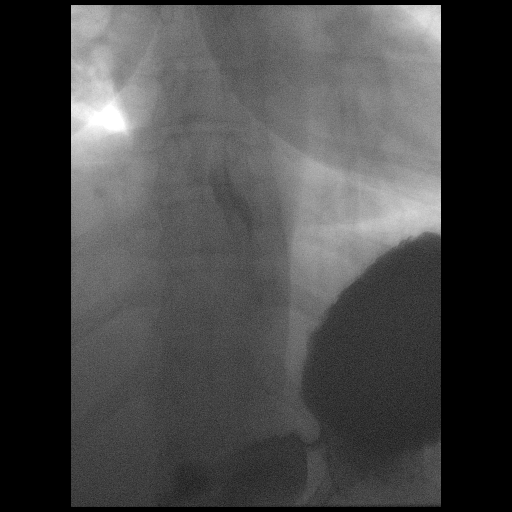

[Series 14: cp_standard · 0.35mm/px · 1 of 40 frames shown (11 of 12)]
[frame 40/40]
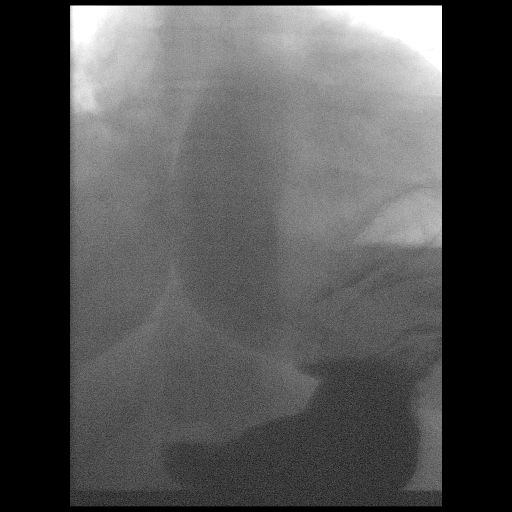

[Series 16: cp_standard · 0.34mm/px · 1 of 19 frames shown (12 of 12)]
[frame 17/19]
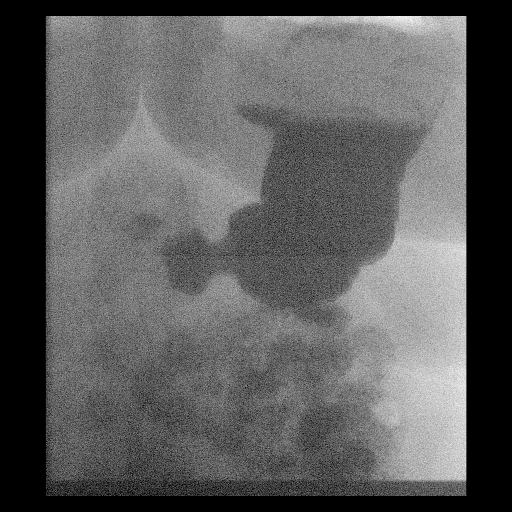

[12 of 24 positions shown; findings below may reference images not displayed]

FINDINGS: Primary peristaltic waves in the esophagus were normal. No
esophageal stricture, visible ulceration, or other significant
abnormality.

No gastroesophageal reflux was noted during the course of the
examination. No hiatal hernia.

Gastric morphology and appearance appear normal. Proximal duodenum
appears normal.
IMPRESSION: Normal upper GI series.

## 2022-04-24 ENCOUNTER — Other Ambulatory Visit (HOSPITAL_COMMUNITY): Payer: Self-pay

## 2022-05-08 ENCOUNTER — Ambulatory Visit (INDEPENDENT_AMBULATORY_CARE_PROVIDER_SITE_OTHER): Payer: BC Managed Care – PPO | Admitting: Family Medicine

## 2022-05-08 ENCOUNTER — Ambulatory Visit (HOSPITAL_BASED_OUTPATIENT_CLINIC_OR_DEPARTMENT_OTHER)
Admission: RE | Admit: 2022-05-08 | Discharge: 2022-05-08 | Disposition: A | Discharge status: Home / Self Care | Payer: BC Managed Care – PPO | Source: Ambulatory Visit | Attending: Family Medicine | Admitting: Family Medicine

## 2022-05-08 ENCOUNTER — Encounter: Payer: Self-pay | Admitting: Family Medicine

## 2022-05-08 VITALS — BP 110/70 | HR 92 | Temp 98.4°F | Resp 18 | Ht 62.0 in | Wt 275.6 lb

## 2022-05-08 DIAGNOSIS — N939 Abnormal uterine and vaginal bleeding, unspecified: Secondary | ICD-10-CM | POA: Diagnosis present

## 2022-05-08 DIAGNOSIS — Z9884 Bariatric surgery status: Secondary | ICD-10-CM

## 2022-05-08 DIAGNOSIS — Z23 Encounter for immunization: Secondary | ICD-10-CM

## 2022-05-08 LAB — IBC PANEL
Iron: 87 ug/dL (ref 42–145)
Saturation Ratios: 26.7 % (ref 20.0–50.0)
TIBC: 326.2 ug/dL (ref 250.0–450.0)
Transferrin: 233 mg/dL (ref 212.0–360.0)

## 2022-05-08 LAB — COMPREHENSIVE METABOLIC PANEL
ALT: 20 U/L (ref 0–35)
AST: 16 U/L (ref 0–37)
Albumin: 4.3 g/dL (ref 3.5–5.2)
Alkaline Phosphatase: 82 U/L (ref 39–117)
BUN: 10 mg/dL (ref 6–23)
CO2: 25 mEq/L (ref 19–32)
Calcium: 9.3 mg/dL (ref 8.4–10.5)
Chloride: 106 mEq/L (ref 96–112)
Creatinine, Ser: 0.6 mg/dL (ref 0.40–1.20)
GFR: 113.04 mL/min (ref >60.00)
Glucose, Bld: 88 mg/dL (ref 70–99)
Potassium: 4 mEq/L (ref 3.5–5.1)
Sodium: 138 mEq/L (ref 135–145)
Total Bilirubin: 0.4 mg/dL (ref 0.2–1.2)
Total Protein: 6.9 g/dL (ref 6.0–8.3)

## 2022-05-08 LAB — CBC WITH DIFFERENTIAL/PLATELET
Basophils Absolute: 0.1 10*3/uL (ref 0.0–0.1)
Basophils Relative: 0.9 % (ref 0.0–3.0)
Eosinophils Absolute: 0.3 10*3/uL (ref 0.0–0.7)
Eosinophils Relative: 4.5 % (ref 0.0–5.0)
HCT: 43.9 % (ref 36.0–46.0)
Hemoglobin: 14.9 g/dL (ref 12.0–15.0)
Lymphocytes Relative: 32.2 % (ref 12.0–46.0)
Lymphs Abs: 2.1 10*3/uL (ref 0.7–4.0)
MCHC: 34.1 g/dL (ref 30.0–36.0)
MCV: 91.6 fl (ref 78.0–100.0)
Monocytes Absolute: 0.3 10*3/uL (ref 0.1–1.0)
Monocytes Relative: 5.1 % (ref 3.0–12.0)
Neutro Abs: 3.8 10*3/uL (ref 1.4–7.7)
Neutrophils Relative %: 57.3 % (ref 43.0–77.0)
Platelets: 285 10*3/uL (ref 150.0–400.0)
RBC: 4.79 Mil/uL (ref 3.87–5.11)
RDW: 13 % (ref 11.5–15.5)
WBC: 6.6 10*3/uL (ref 4.0–10.5)

## 2022-05-08 LAB — POCT URINE PREGNANCY: Preg Test, Ur: NEGATIVE

## 2022-05-08 LAB — VITAMIN B12: Vitamin B-12: 302 pg/mL (ref 211–911)

## 2022-05-08 LAB — VITAMIN D 25 HYDROXY (VIT D DEFICIENCY, FRACTURES): VITD: 38.53 ng/mL (ref 30.00–100.00)

## 2022-05-08 NOTE — Progress Notes (Signed)
Subjective:   By signing my name below, I, Jeanne [name], attest that this documentation has been prepared under the direction and in the presence of Ann Held DO 05/08/2022   Patient ID: Jeanne [name], female    DOB: Mar 01, 1981, 41 y.o.   MRN: [id number]  Chief Complaint  Patient presents with   Vaginal Bleeding    Pt states period was late and then started. Pt states she thought period was going away but states came back full force. Pt states passing more clots. Pt states having fatigue. Pt states no abnormal pains or cramps    HPI Patient is in today for an office visit  She complains of irregular bleeding. She states that before her cycle started, she had 40 days without a cycle. Since her cycle has started, she states that she has been bleeding for about 15 days. On 03/06/2022, she notes of more prevalent clots. She states that she has been going through about 2-3 pads daily. She has a history of fibroids and states that she had a hysteroscopy polypectomy with myosure and D&C on 05/19/2020 by Dr.Yalcinkaya. She has not seen the specialist since the procedure. She is not sure if her symptoms are similar to when she had fibroids because she does not remember. She notes that she is 6 weeks on her Ozempic medication. She is taking vitamins but non consistently because they make her nauseated.   She states that since starting Ozempic 6 weeks ago, she lost about 10 lbs. She is regularly being seen by Dr.Wilson.  Wt Readings from Last 3 Encounters:  05/08/22 275 lb 9.6 oz (125 kg)  01/05/22 284 lb 3.2 oz (128.9 kg)  12/15/21 280 lb 11.2 oz (127.3 kg)   She is interested in receiving an influenza vaccine during today's visit.   Past Medical History:  Diagnosis Date   Anxiety    Asthma    Dry eyes    Dry skin    Eczema    Fatigue    Fibroids    GERD (gastroesophageal reflux disease)    H/O echocardiogram    only showed tachycardia   Heartburn    HTN (hypertension)     Hyperlipidemia    Palpitations    Pre-diabetes     pre diabetes, Metformin ordered from weight loss clinic   Rash in adult    Shortness of breath    due to allergies   Stress    Tachycardia    Vitamin D deficiency    Wheezing     Past Surgical History:  Procedure Laterality Date   EYE SURGERY Bilateral 2006   Lasik   HYSTEROSCOPY N/A 05/19/2020   Procedure: HYSTEROSCOPY POLYPECTOMY WITH MYOSURE AND D&C;  Surgeon: Governor Specking, MD;  Location: [facility];  Service: Gynecology;  Laterality: N/A;   LAPAROSCOPIC GASTRIC SLEEVE RESECTION N/A 05/16/2021   Procedure: LAPAROSCOPIC GASTRIC SLEEVE RESECTION;  Surgeon: Greer Pickerel, MD;  Location: [facility];  Service: General;  Laterality: N/A;   MYOMECTOMY N/A 10/30/2017   Procedure: ABDOMINAL MYOMECTOMY;  Surgeon: Lavonia Drafts, MD;  Location: [facility];  Service: Gynecology;  Laterality: N/A;   UPPER GI ENDOSCOPY N/A 05/16/2021   Procedure: UPPER GI ENDOSCOPY;  Surgeon: Greer Pickerel, MD;  Location: [facility];  Service: General;  Laterality: N/A;    Family History  Problem Relation [name] of Onset   Thyroid disease Mother    Fibroids Mother    Deep vein thrombosis Mother    Cancer Maternal Grandmother  liver    Cancer Maternal Grandfather        pancreatic   Hyperlipidemia Father    Hypertension Father    Sleep apnea Father    Obesity Father    Cancer Paternal Grandfather        prostate    Social History   Socioeconomic History   Marital status: Married    Spouse name: [name]   Number of children: Not on file   Years of education: Not on file   Highest education level: Not on file  Occupational History   Occupation: Product manager: [employer]    Comment: 4 th grade  Tobacco Use   Smoking status: Never   Smokeless tobacco: Never  Vaping Use   Vaping Use: Never used  Substance and Sexual Activity   Alcohol use: No    Alcohol/week: 0.0 standard drinks of alcohol   Drug use: No   Sexual  activity: Yes    Partners: Male    Birth control/protection: Pill  Other Topics Concern   Not on file  Social History Narrative   Exercise--- walk in summer--- its been difficult during school year   Social Determinants of Health   Financial Resource Strain: Not on file  Food Insecurity: Not on file  Transportation Needs: Not on file  Physical Activity: Not on file  Stress: Not on file  Social Connections: Not on file  Intimate Partner Violence: Not on file    Outpatient Medications Prior to Visit  Medication Sig Dispense Refill   albuterol (VENTOLIN HFA) 108 (90 Base) MCG/ACT inhaler TAKE 2 PUFFS BY MOUTH EVERY 6 HOURS AS NEEDED FOR WHEEZE OR SHORTNESS OF BREATH (Patient taking differently: Inhale 2 puffs into the lungs every 6 (six) hours as needed for wheezing or shortness of breath.) 90 g 1   ALPRAZolam (XANAX) 0.25 MG tablet TAKE 1 TABLET BY MOUTH TWICE A DAY AS NEEDED FOR ANXIETY 20 tablet 0   buPROPion (WELLBUTRIN SR) 200 MG 12 hr tablet Take 200 mg by mouth every morning.     Calcium Carbonate-Vitamin D (CALTRATE 600+D PO) Take by mouth.     fluticasone (FLONASE) 50 MCG/ACT nasal spray SPRAY 2 SPRAYS INTO EACH NOSTRIL EVERY DAY 48 mL 5   Fluticasone-Umeclidin-Vilant (TRELEGY ELLIPTA) 100-62.5-25 MCG/ACT AEPB Inhale 1 puff into the lungs daily. 1 each 11   metoprolol succinate (TOPROL-XL) 100 MG 24 hr tablet TAKE 1 TABLET BY MOUTH EVERY DAY WITH OR IMMEDIATELY FOLLOWING A MEAL 90 tablet 1   Multiple Vitamins-Minerals (BARIATRIC MULTIVITAMINS/IRON PO) Take by mouth.     rosuvastatin (CRESTOR) 10 MG tablet Take 1 tablet (10 mg total) by mouth at bedtime. 90 tablet 1   Semaglutide-Weight Management (WEGOVY) 0.25 MG/0.5ML SOAJ Inject 0.25 mg into the skin once a week. 2 mL 0   Semaglutide-Weight Management (WEGOVY) 1.7 MG/0.75ML SOAJ Inject 1.7 mg into the skin once a week. 3 mL 2   sertraline (ZOLOFT) 100 MG tablet Take 2 tablets (200 mg total) by mouth daily. 180 tablet 1    triamcinolone cream (KENALOG) 0.1 % Apply 1 application topically 2 (two) times daily as needed (for eczema on hands.). 30 g 3   Vitamin D, Ergocalciferol, (DRISDOL) 1.25 MG (50000 UNIT) CAPS capsule TAKE 1 CAPSULE (50,000 UNITS TOTAL) BY MOUTH EVERY 7 (SEVEN) DAYS 12 capsule 1   No facility-administered medications prior to visit.    No Known Allergies  Review of Systems  Constitutional:  Positive for malaise/fatigue. Negative  for fever.  HENT:  Negative for congestion.   Eyes:  Negative for blurred vision.  Respiratory:  Negative for cough and shortness of breath.   Cardiovascular:  Negative for chest pain, palpitations and leg swelling.  Gastrointestinal:  Negative for abdominal pain, constipation, diarrhea, heartburn, nausea and vomiting.  Genitourinary:  Negative for flank pain.       (+) Irregular Bleeding  Musculoskeletal:  Negative for back pain.  Skin:  Negative for rash.  Neurological:  Negative for loss of consciousness and headaches.       Objective:    Physical Exam Vitals and nursing note reviewed.  Constitutional:      General: She is not in acute distress.    Appearance: Normal appearance. She is not ill-appearing.  HENT:     Head: Normocephalic and atraumatic.     Right Ear: External ear normal.     Left Ear: External ear normal.  Eyes:     Extraocular Movements: Extraocular movements intact.     Pupils: Pupils are equal, round, and reactive to light.  Cardiovascular:     Rate and Rhythm: Normal rate and regular rhythm.     Heart sounds: Normal heart sounds. No murmur heard.    No gallop.  Pulmonary:     Effort: Pulmonary effort is normal. No respiratory distress.     Breath sounds: Normal breath sounds. No wheezing or rales.  Abdominal:     Palpations: Abdomen is soft.     Tenderness: There is no abdominal tenderness. There is no guarding or rebound.  Skin:    General: Skin is warm and dry.  Neurological:     Mental Status: She is alert and oriented  to person, place, and time.  Psychiatric:        Judgment: Judgment normal.     BP 110/70 (BP Location: Left Arm, Patient Position: Sitting, Cuff Size: Large)   Pulse 92   Temp 98.4 F (36.9 C) (Oral)   Resp 18   Ht '5\' 2"'$  (1.575 m)   Wt 275 lb 9.6 oz (125 kg)   SpO2 96%   BMI 50.41 kg/m  Wt Readings from Last 3 Encounters:  05/08/22 275 lb 9.6 oz (125 kg)  01/05/22 284 lb 3.2 oz (128.9 kg)  12/15/21 280 lb 11.2 oz (127.3 kg)    Diabetic Foot Exam - Simple   No data filed    Lab Results  Component Value Date   WBC 9.9 10/13/2021   HGB 15.0 10/13/2021   HCT 44.5 10/13/2021   PLT 301.0 10/13/2021   GLUCOSE 91 10/13/2021   CHOL 172 10/13/2021   TRIG 102.0 10/13/2021   HDL 54.80 10/13/2021   LDLCALC 97 10/13/2021   ALT 21 10/13/2021   AST 17 10/13/2021   NA 138 10/13/2021   K 4.4 10/13/2021   CL 104 10/13/2021   CREATININE 0.64 10/13/2021   BUN 14 10/13/2021   CO2 25 10/13/2021   TSH 1.25 10/13/2021   HGBA1C 6.2 05/09/2021    Lab Results  Component Value Date   TSH 1.25 10/13/2021   Lab Results  Component Value Date   WBC 9.9 10/13/2021   HGB 15.0 10/13/2021   HCT 44.5 10/13/2021   MCV 92.1 10/13/2021   PLT 301.0 10/13/2021   Lab Results  Component Value Date   NA 138 10/13/2021   K 4.4 10/13/2021   CO2 25 10/13/2021   GLUCOSE 91 10/13/2021   BUN 14 10/13/2021   CREATININE 0.64 10/13/2021  BILITOT 0.4 10/13/2021   ALKPHOS 89 10/13/2021   AST 17 10/13/2021   ALT 21 10/13/2021   PROT 6.8 10/13/2021   ALBUMIN 4.3 10/13/2021   CALCIUM 9.5 10/13/2021   ANIONGAP 7 05/17/2021   GFR 111.73 10/13/2021   Lab Results  Component Value Date   CHOL 172 10/13/2021   Lab Results  Component Value Date   HDL 54.80 10/13/2021   Lab Results  Component Value Date   LDLCALC 97 10/13/2021   Lab Results  Component Value Date   TRIG 102.0 10/13/2021   Lab Results  Component Value Date   CHOLHDL 3 10/13/2021   Lab Results  Component Value Date    HGBA1C 6.2 05/09/2021       Assessment & Plan:   Problem List Items Addressed This Visit       Unprioritized   Vaginal bleeding - Primary    Preg neg Check [place] since h/o fibroids  F/u gyn       Relevant Orders   CBC with Differential/Platelet   Comprehensive metabolic panel   POCT urine pregnancy (Completed)   IBC panel   hCG, Total, Quantitative   US Pelvic Complete With Transvaginal   Thyroid Panel With TSH   Thyroid peroxidase antibody   Other Visit Diagnoses     Need for influenza vaccination       Relevant Orders   Flu Vaccine QUAD 57moIM (Fluarix, Fluzone & Alfiuria Quad PF) (Completed)   H/O bariatric surgery       Relevant Orders   Vitamin B12   VITAMIN D 25 Hydroxy (Vit-D Deficiency, Fractures)      No orders of the defined types were placed in this encounter.   I,Ann Held DO, personally preformed the services described in this documentation.  All medical record entries made by the scribe were at my direction and in my presence.  I have reviewed the chart and discharge instructions (if applicable) and agree that the record reflects my personal performance and is accurate and complete. 05/08/2022   I,Amber Collins,acting as a scribe for YAnn Held DO.,have documented all relevant documentation on the behalf of YAnn Held DO,as directed by  YAnn Held DO while in the presence of YAnn Held DO.    YAnn Held DO

## 2022-05-08 NOTE — Patient Instructions (Signed)
Abnormal Uterine Bleeding ? ?Abnormal uterine bleeding is unusual bleeding from the uterus. It includes bleeding after sex, or bleeding or spotting between menstrual periods. It may also include bleeding that is heavier than normal, menstrual periods that last longer than usual, or bleeding that occurs after menopause. ?Abnormal uterine bleeding can affect teenagers, women in their reproductive years, pregnant women, and women who have reached menopause. Common causes of abnormal uterine bleeding include: ?Pregnancy. ?Abnormal growths within the lining of the uterus (polyps). ?Benign tumors or growths in the uterus (fibroids). These are not cancer. ?Infection. ?Cancer. ?Too much or too little of some hormones in the body (hormonal imbalances). ?Any type of abnormal bleeding should be checked by a health care provider. Many cases are minor and simple to treat, but others may be more serious. Treatment will depend on the cause of the bleeding and how severe it is. ?Follow these instructions at home: ?Medicines ?Take over-the-counter and prescription medicines only as told by your health care provider. ?Ask your health care provider about: ?Taking medicines such as aspirin and ibuprofen. These medicines can thin your blood. Do not take these medicines unless your health care provider tells you to take them. ?Taking over-the-counter medicines, vitamins, herbs, and supplements. ?If you were prescribed iron pills, take them as told by your health care provider. Iron pills help to replace iron that your body loses because of this condition. ?Managing constipation ?In cases of severe bleeding, you may be asked to increase your iron intake to treat anemia. Doing this may cause constipation. To prevent or treat constipation, you may need to: ?Drink enough fluid to keep your urine pale yellow. ?Take over-the-counter or prescription medicines. ?Eat foods that are high in fiber, such as beans, whole grains, and fresh fruits and  vegetables. ?Limit foods that are high in fat and processed sugars, such as fried or sweet foods. ?Activity ?Alter your activity to decrease bleeding if you need to change your sanitary pad more than one time every 2 hours: ?Lie in bed with your feet raised (elevated). ?Place a cold pack on your lower abdomen. ?Rest as much as possible until the bleeding stops or slows down. ?General instructions ?Do not use tampons, douche, or have sex until your health care provider says these things are okay. ?Change your sanitary pads often. ?Get regular exams. These include pelvic exams and cervical cancer screenings. ?It is up to you to get the results of any tests that are done. Ask your health care provider, or the department that is doing the tests, when your results will be ready. ?Monitor your condition for any changes. For 2 months, write down: ?When your menstrual period starts. ?When your menstrual period ends. ?When any abnormal vaginal bleeding occurs. ?What problems you notice. ?Keep all follow-up visits. This is important. ?Contact a health care provider if: ?You have bleeding that lasts for more than one week. ?You feel dizzy at times. ?You feel nauseous or you vomit. ?You feel light-headed or weak. ?You notice any other changes that show that your condition is getting worse. ?Get help right away if: ?You faint. ?You have bleeding that soaks through a sanitary pad every hour. ?You have pain in the abdomen. ?You have a fever or chills. ?You become sweaty or weak. ?You pass large blood clots from your vagina. ?These symptoms may represent a serious problem that is an emergency. Do not wait to see if the symptoms will go away. Get medical help right away. Call your local emergency services (  911 in the [place] Do not drive yourself to the hospital. ?Summary ?Abnormal uterine bleeding is unusual bleeding from the uterus. ?Any type of abnormal bleeding should be checked by a health care provider. Many cases are minor and  simple to treat, but others may be more serious. ?Treatment will depend on the cause of the bleeding and how severe it is. ?Get help right away if you faint, you have bleeding that soaks through a sanitary pad every hour, or you pass large blood clots from your vagina. ?This information is not intended to replace advice given to you by your health care provider. Make sure you discuss any questions you have with your health care provider. ?Document Revised: 09/21/2020 Document Reviewed: 09/21/2020 ?Elsevier Patient Education ? 2023 [employer] ? ?

## 2022-05-08 NOTE — Assessment & Plan Note (Signed)
Preg neg Check [place] since h/o fibroids  F/u gyn

## 2022-05-09 LAB — THYROID PEROXIDASE ANTIBODY: Thyroperoxidase Ab SerPl-aCnc: 137 IU/mL — ABNORMAL HIGH (ref <9)

## 2022-05-09 LAB — THYROID PANEL WITH TSH
Free Thyroxine Index: 1.9 (ref 1.4–3.8)
T3 Uptake: 30 % (ref 22–35)
T4, Total: 6.2 ug/dL (ref 5.1–11.9)
TSH: 1.74 mIU/L

## 2022-05-09 LAB — HCG, TOTAL, QUANTITATIVE: hCG, Beta Chain, Quant, S: <5 m[IU]/mL

## 2022-05-10 ENCOUNTER — Other Ambulatory Visit: Payer: Self-pay | Admitting: Family Medicine

## 2022-05-10 DIAGNOSIS — R768 Other specified abnormal immunological findings in serum: Secondary | ICD-10-CM

## 2022-05-17 ENCOUNTER — Encounter: Payer: Self-pay | Admitting: Family Medicine

## 2022-05-17 NOTE — Telephone Encounter (Signed)
It looks like the referral was denied possibly due to provider availability? Should we send the referral somewhere else?

## 2022-05-20 ENCOUNTER — Other Ambulatory Visit: Payer: Self-pay | Admitting: Family Medicine

## 2022-05-20 DIAGNOSIS — Z3009 Encounter for other general counseling and advice on contraception: Secondary | ICD-10-CM

## 2022-05-22 ENCOUNTER — Other Ambulatory Visit (HOSPITAL_COMMUNITY): Payer: Self-pay

## 2022-05-24 ENCOUNTER — Telehealth (INDEPENDENT_AMBULATORY_CARE_PROVIDER_SITE_OTHER): Payer: BC Managed Care – PPO | Admitting: Psychiatry

## 2022-05-24 ENCOUNTER — Encounter: Payer: Self-pay | Admitting: Psychiatry

## 2022-05-24 DIAGNOSIS — F3342 Major depressive disorder, recurrent, in full remission: Secondary | ICD-10-CM

## 2022-05-24 DIAGNOSIS — F411 Generalized anxiety disorder: Secondary | ICD-10-CM

## 2022-05-24 MED ORDER — SERTRALINE HCL 100 MG PO TABS: 200.0000 mg | ORAL_TABLET | Freq: Every day | ORAL | 2 refills | Status: DC

## 2022-05-24 NOTE — Progress Notes (Signed)
Jeanne [name] [id number] 08-08-1980 41 y.o.  Virtual Visit via Video Note  I connected with pt @ on 05/24/22 at  9:00 AM EST by a video enabled telemedicine application and verified that I am speaking with the correct person using two identifiers.   I discussed the limitations of evaluation and management by telemedicine and the availability of in person appointments. The patient expressed understanding and agreed to proceed.  I discussed the assessment and treatment plan with the patient. The patient was provided an opportunity to ask questions and all were answered. The patient agreed with the plan and demonstrated an understanding of the instructions.   The patient was advised to call back or seek an in-person evaluation if the symptoms worsen or if the condition fails to improve as anticipated.  I provided 19 minutes of non-face-to-face time during this encounter.  The patient was located at home.  The provider was located at home.   Thayer Headings, PMHNP   Subjective:   Patient ID:  Jeanne [name] is a 41 y.o. (DOB [birth year]) female.  Chief Complaint:  Chief Complaint  Patient presents with   Follow-up    Anxiety, depression    HPI Jeanne [name] presents for follow-up of anxiety and depression. She reports that her anxiety has been "manageable." Denies panic. She denies depressed mood. Energy and motivation have been good. She reports that her sleep is "normal." She has been using a podcast called "Nothing Much Happens" to fall asleep. Started Mancel Parsons recently and appetite has been decreased. She has lost 13 lbs intentionally. Concentration has been "normal." Denies SI.   She started seeing therapist and this has been helpful.   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   She reports taking Xanax prn on rare occasions, typically when she is having acute anxiety and unable to leave the situation. Taking it  no more than 1-2 times a month.  Past Psychiatric Medication Trials: Sertraline Wellbutrin Xanax  Review of Systems:  Review of Systems  Gastrointestinal:  Negative for diarrhea.       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  Musculoskeletal:  Negative for gait problem.  Psychiatric/Behavioral:         Please refer to HPI    Medications: I have reviewed the patient's current medications.  Current Outpatient Medications  Medication Sig Dispense Refill   ALPRAZolam (XANAX) 0.25 MG tablet TAKE 1 TABLET BY MOUTH TWICE A DAY AS NEEDED FOR ANXIETY 20 tablet 0   buPROPion (WELLBUTRIN SR) 200 MG 12 hr tablet Take 200 mg by mouth every morning.     Calcium Carbonate-Vitamin D (CALTRATE 600+D PO) Take by mouth.     fluticasone (FLONASE) 50 MCG/ACT nasal spray SPRAY 2 SPRAYS INTO EACH NOSTRIL EVERY DAY 48 mL 5   Fluticasone-Umeclidin-Vilant (TRELEGY ELLIPTA) 100-62.5-25 MCG/ACT AEPB Inhale 1 puff into the lungs daily. 1 each 11   metoprolol succinate (TOPROL-XL) 100 MG 24 hr tablet TAKE 1 TABLET BY MOUTH EVERY DAY WITH OR IMMEDIATELY FOLLOWING A MEAL 90 tablet 1   Multiple Vitamins-Minerals (BARIATRIC MULTIVITAMINS/IRON PO) Take by mouth.     rosuvastatin (CRESTOR) 10 MG tablet Take 1 tablet (10 mg total) by mouth at bedtime. 90 tablet 1   triamcinolone cream (KENALOG) 0.1 % Apply 1 application topically 2 (two) times daily as needed (for eczema  on hands.). 30 g 3   Vitamin D, Ergocalciferol, (DRISDOL) 1.25 MG (50000 UNIT) CAPS capsule TAKE 1 CAPSULE (50,000 UNITS TOTAL) BY MOUTH EVERY 7 (SEVEN) DAYS 12 capsule 1   albuterol (VENTOLIN HFA) 108 (90 Base) MCG/ACT inhaler TAKE 2 PUFFS BY MOUTH EVERY 6 HOURS AS NEEDED FOR WHEEZE OR SHORTNESS OF BREATH (Patient taking differently: Inhale 2 puffs into the lungs every 6 (six) hours as needed for wheezing or shortness of breath.) 90 g 1    Semaglutide-Weight Management (WEGOVY) 0.25 MG/0.5ML SOAJ Inject 0.25 mg into the skin once a week. 2 mL 0   Semaglutide-Weight Management (WEGOVY) 1.7 MG/0.75ML SOAJ Inject 1.7 mg into the skin once a week. 3 mL 2   sertraline (ZOLOFT) 100 MG tablet Take 2 tablets (200 mg total) by mouth daily. 180 tablet 2   No current facility-administered medications for this visit.    Medication Side Effects: None  Allergies: No Known Allergies  Past Medical History:  Diagnosis Date   Anxiety    Asthma    Dry eyes    Dry skin    Eczema    Fatigue    Fibroids    GERD (gastroesophageal reflux disease)    H/O echocardiogram    only showed tachycardia   Heartburn    HTN (hypertension)    Hyperlipidemia    Palpitations    Pre-diabetes     pre diabetes, Metformin ordered from weight loss clinic   Rash in adult    Shortness of breath    due to allergies   Stress    Tachycardia    Vitamin D deficiency    Wheezing     Family History  Problem Relation Age of Onset   Thyroid disease Mother    Fibroids Mother    Deep vein thrombosis Mother    Cancer Maternal Grandmother        liver    Cancer Maternal Grandfather        pancreatic   Hyperlipidemia Father    Hypertension Father    Sleep apnea Father    Obesity Father    Cancer Paternal Grandfather        prostate    Social History   Socioeconomic History   Marital status: Married    Spouse name: Jeanne [name]   Number of children: Not on file   Years of education: Not on file   Highest education level: Not on file  Occupational History   Occupation: Product manager: [employer]    Comment: 4 th grade  Tobacco Use   Smoking status: Never   Smokeless tobacco: Never  Vaping Use   Vaping Use: Never used  Substance and Sexual Activity   Alcohol use: No    Alcohol/week: 0.0 standard drinks of alcohol   Drug use: No   Sexual activity: Yes    Partners: Male    Birth control/protection: Pill  Other Topics Concern    Not on file  Social History Narrative   Exercise--- walk in summer--- its been difficult during school year   Social Determinants of Health   Financial Resource Strain: Not on file  Food Insecurity: Not on file  Transportation Needs: Not on file  Physical Activity: Not on file  Stress: Not on file  Social Connections: Not on file  Intimate Partner Violence: Not on file    Past Medical History, Surgical history, Social history, and Family history were reviewed and updated as appropriate.   Please see  review of systems for further details on the patient's review from today.   Objective:   Physical Exam:  There were no vitals taken for this visit.  Physical Exam Constitutional:      General: She is not in acute distress. Musculoskeletal:        General: No deformity.  Neurological:     Mental Status: She is alert and oriented to person, place, and time.     Coordination: Coordination normal.  Psychiatric:        Attention and Perception: Attention and perception normal. She does not perceive auditory or visual hallucinations.        Mood and Affect: Mood normal. Mood is not anxious or depressed. Affect is not labile, blunt, angry or inappropriate.        Speech: Speech normal.        Behavior: Behavior normal.        Thought Content: Thought content normal. Thought content is not paranoid or delusional. Thought content does not include homicidal or suicidal ideation. Thought content does not include homicidal or suicidal plan.        Cognition and Memory: Cognition and memory normal.        Judgment: Judgment normal.     Comments: Insight intact     Lab Review:     Component Value Date/Time   NA 138 05/08/2022 0903   NA 139 12/12/2018 0922   K 4.0 05/08/2022 0903   CL 106 05/08/2022 0903   CO2 25 05/08/2022 0903   GLUCOSE 88 05/08/2022 0903   BUN 10 05/08/2022 0903   BUN 14 12/12/2018 0922   CREATININE 0.60 05/08/2022 0903   CALCIUM 9.3 05/08/2022 0903   PROT  6.9 05/08/2022 0903   PROT 6.6 12/12/2018 0922   ALBUMIN 4.3 05/08/2022 0903   ALBUMIN 4.4 12/12/2018 0922   AST 16 05/08/2022 0903   ALT 20 05/08/2022 0903   ALKPHOS 82 05/08/2022 0903   BILITOT 0.4 05/08/2022 0903   BILITOT 0.2 12/12/2018 0922   GFRNONAA >60 05/17/2021 0443   GFRAA >60 04/21/2019 1744       Component Value Date/Time   WBC 6.6 05/08/2022 0903   RBC 4.79 05/08/2022 0903   HGB 14.9 05/08/2022 0903   HGB 14.3 04/25/2018 1318   HCT 43.9 05/08/2022 0903   HCT 43.0 04/25/2018 1318   PLT 285.0 05/08/2022 0903   PLT 363 04/25/2018 1318   MCV 91.6 05/08/2022 0903   MCV 90 04/25/2018 1318   MCH 31.0 05/17/2021 0443   MCHC 34.1 05/08/2022 0903   RDW 13.0 05/08/2022 0903   RDW 12.6 04/25/2018 1318   LYMPHSABS 2.1 05/08/2022 0903   LYMPHSABS 2.3 04/25/2018 1318   MONOABS 0.3 05/08/2022 0903   EOSABS 0.3 05/08/2022 0903   EOSABS 0.3 04/25/2018 1318   BASOSABS 0.1 05/08/2022 0903   BASOSABS 0.1 04/25/2018 1318    No results found for: "POCLITH", "LITHIUM"   No results found for: "PHENYTOIN", "PHENOBARB", "VALPROATE", "CBMZ"   .res Assessment: Plan:    Will continue current plan of care since target signs and symptoms are well controlled without any tolerability issues. Continue Sertraline 200 mg daily for anxiety and depression.  Wellbutrin SR and Alprazolam are currently managed by PCP.  Pt to follow-up in 6 months or sooner if clinically indicated.  Patient advised to contact office with any questions, adverse effects, or acute worsening in signs and symptoms.   Deadra was seen today for follow-up.  Diagnoses and all orders  for this visit:  Generalized anxiety disorder -     sertraline (ZOLOFT) 100 MG tablet; Take 2 tablets (200 mg total) by mouth daily.  Recurrent major depressive disorder, in full remission ([place]) -     sertraline (ZOLOFT) 100 MG tablet; Take 2 tablets (200 mg total) by mouth daily.     Please see After Visit Summary for patient  specific instructions.  Future Appointments  Date Time Provider Albert  06/07/2022  4:10 PM Lavonia Drafts, MD [facility] None    No orders of the defined types were placed in this encounter.     -------------------------------

## 2022-05-29 ENCOUNTER — Other Ambulatory Visit: Payer: Self-pay | Admitting: Family Medicine

## 2022-06-01 ENCOUNTER — Telehealth: Payer: Self-pay | Admitting: Family Medicine

## 2022-06-01 ENCOUNTER — Encounter: Payer: Self-pay | Admitting: Family Medicine

## 2022-06-01 NOTE — Telephone Encounter (Signed)
Pt seen on 12/4. Would you like for her to have a visit to complete this form?

## 2022-06-01 NOTE — Telephone Encounter (Signed)
Received

## 2022-06-01 NOTE — Telephone Encounter (Signed)
Patient came in and dropped off a Physicians Report on Prospective Adoptive Parent Form from [place] that needs to be filled out. Patient will like the form to be emailed to [email or web address]. Patient would like a confirmation call that the form was completed.

## 2022-06-05 ENCOUNTER — Other Ambulatory Visit: Payer: Self-pay | Admitting: Family Medicine

## 2022-06-05 DIAGNOSIS — R002 Palpitations: Secondary | ICD-10-CM

## 2022-06-07 ENCOUNTER — Ambulatory Visit (INDEPENDENT_AMBULATORY_CARE_PROVIDER_SITE_OTHER): Payer: BC Managed Care – PPO | Admitting: Obstetrics & Gynecology

## 2022-06-07 ENCOUNTER — Encounter: Payer: Self-pay | Admitting: Obstetrics & Gynecology

## 2022-06-07 VITALS — BP 116/62 | HR 89 | Wt 276.0 lb

## 2022-06-07 DIAGNOSIS — N939 Abnormal uterine and vaginal bleeding, unspecified: Secondary | ICD-10-CM | POA: Diagnosis not present

## 2022-06-07 MED ORDER — NORETHINDRONE ACET-ETHINYL EST 1-20 MG-MCG PO TABS: 1.0000 | ORAL_TABLET | Freq: Every day | ORAL | 4 refills | Status: DC

## 2022-06-07 NOTE — Progress Notes (Signed)
History:  42 y.o. G0P0000 here today for f/u of AUB. 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 Has lost 60#'s. She prev had amenorrhea but, now has been bleeding for 30+ days at a time.   Pt has had infertility. Is looking into adopting presently. Was advised to delay any pregnancy for 1 year. Pt is currently on no contraception.  Was prev on LoEstrin.   The following portions of the patient's history were reviewed and updated as appropriate: allergies, current medications, past family history, past medical history, past social history, past surgical history and problem list.  Review of Systems:  Pertinent items are noted in HPI.    Objective:  Physical Exam Blood pressure 116/62, pulse 89, weight 276 lb (125.2 kg), last menstrual period 06/05/2022.  CONSTITUTIONAL: Well-developed, well-nourished female in no acute distress.  HENT:  Normocephalic, atraumatic EYES: Conjunctivae and EOM are normal. No scleral icterus.  NECK: Normal range of motion SKIN: Skin is warm and dry. No rash noted. Not diaphoretic.No pallor. [place]: Alert and oriented to person, place, and time. Normal coordination.  Pelvic: deferred  Labs and Imaging 05/08/2022 CLINICAL DATA:  Vaginal bleeding   EXAM: TRANSABDOMINAL AND TRANSVAGINAL ULTRASOUND OF PELVIS   TECHNIQUE: Both transabdominal and transvaginal ultrasound examinations of the pelvis were performed. Transabdominal technique was performed for global imaging of the pelvis including uterus, ovaries, adnexal regions, and pelvic cul-de-sac. It was necessary to proceed with endovaginal exam following the transabdominal exam to visualize the endometrium.   COMPARISON:  None Available.   FINDINGS: Uterus   Measurements: 10.3 x 6.4 x 7.9 cm = volume: 269.6 mL. Within the anterior uterine body there is a 2.7 x 2.6 x 3.5 cm intramural fibroid.   Endometrium   Thickness: 10 mm.  No focal abnormality visualized.    Right ovary   Not visualized   Left ovary   Not visualized   Other findings   No abnormal free fluid.   IMPRESSION: 1. Fibroid uterus. 2. Endometrium measures 10 mm. If bleeding remains unresponsive to hormonal or medical therapy, sonohysterogram should be considered for focal lesion work-up. (Ref: Radiological Reasoning: Algorithmic Workup of Abnormal Vaginal Bleeding with Endovaginal Sonography and Sonohysterography. AJR 2008; [id number]:[id number])    Assessment & Plan:  Diagnoses and all orders for this visit:  Abnormal uterine bleeding (AUB) -     norethindrone-ethinyl estradiol (LOESTRIN 1/20, 21,) 1-20 MG-MCG tablet; Take 1 tablet by mouth daily.   Anaiz Qazi L. Harraway-Smith, M.D., Cherlynn June

## 2022-06-16 ENCOUNTER — Encounter: Payer: Self-pay | Admitting: Obstetrics & Gynecology

## 2022-06-23 ENCOUNTER — Encounter: Payer: Self-pay | Admitting: Obstetrics & Gynecology

## 2022-06-28 ENCOUNTER — Other Ambulatory Visit: Payer: Self-pay

## 2022-06-28 MED ORDER — MEGESTROL ACETATE 40 MG PO TABS: 40.0000 mg | ORAL_TABLET | Freq: Two times a day (BID) | ORAL | 0 refills | Status: DC

## 2022-06-28 NOTE — Progress Notes (Signed)
Called patient to answer her my chart questions (Per Ihor Dow) Left message for patient will also reply via mychart. [name] RN

## 2022-07-04 ENCOUNTER — Other Ambulatory Visit (HOSPITAL_COMMUNITY): Payer: Self-pay

## 2022-07-04 MED ORDER — WEGOVY 2.4 MG/0.75ML ~~LOC~~ SOAJ
2.4000 mg | SUBCUTANEOUS | 2 refills | Status: DC
  Filled 2022-07-04: qty 3, 28d supply, fill #0
  Filled 2022-07-29: qty 3, 28d supply, fill #1
  Filled 2022-08-26: qty 3, 28d supply, fill #2

## 2022-07-05 ENCOUNTER — Other Ambulatory Visit (HOSPITAL_COMMUNITY): Payer: Self-pay

## 2022-07-13 ENCOUNTER — Telehealth: Payer: Self-pay | Admitting: General Practice

## 2022-07-13 NOTE — Telephone Encounter (Signed)
Left message on VM for pt to contact our office to schedule annual in April with Dr. Ihor Dow.

## 2022-07-13 NOTE — Telephone Encounter (Signed)
-----   Message from Maurine Minister, [place] sent at 06/07/2022  4:43 PM EST ----- Regarding: Annual Schedule Annual with Dr. Eugenie Norrie in April 2024.

## 2022-07-20 ENCOUNTER — Encounter: Payer: Self-pay | Admitting: Obstetrics & Gynecology

## 2022-07-26 ENCOUNTER — Other Ambulatory Visit: Payer: Self-pay | Admitting: Obstetrics & Gynecology

## 2022-07-26 DIAGNOSIS — N939 Abnormal uterine and vaginal bleeding, unspecified: Secondary | ICD-10-CM

## 2022-07-26 MED ORDER — MEGESTROL ACETATE 40 MG PO TABS: 40.0000 mg | ORAL_TABLET | Freq: Every day | ORAL | 0 refills | Status: DC

## 2022-09-04 ENCOUNTER — Encounter: Payer: Self-pay | Admitting: Family Medicine

## 2022-09-04 ENCOUNTER — Ambulatory Visit: Payer: BC Managed Care – PPO | Admitting: Family Medicine

## 2022-09-04 VITALS — BP 110/80 | HR 90 | Temp 98.3°F | Resp 18 | Ht 62.0 in | Wt 267.2 lb

## 2022-09-04 DIAGNOSIS — H60501 Unspecified acute noninfective otitis externa, right ear: Secondary | ICD-10-CM

## 2022-09-04 MED ORDER — OFLOXACIN 0.3 % OT SOLN: 10.0000 [drp] | Freq: Every day | OTIC | 0 refills | Status: DC

## 2022-09-04 MED ORDER — CEFDINIR 300 MG PO CAPS: 300.0000 mg | ORAL_CAPSULE | Freq: Two times a day (BID) | ORAL | 0 refills | Status: DC

## 2022-09-04 NOTE — Patient Instructions (Signed)
Otitis Externa  Otitis externa is an infection of the outer ear canal. The outer ear canal is the area between the outside of the ear and the eardrum. Otitis externa is sometimes called swimmer's ear. What are the causes? Common causes of this condition include: Swimming in dirty water. Moisture in the ear. An injury to the inside of the ear. An object stuck in the ear. A cut or scrape on the outside of the ear or in the ear canal. What increases the risk? You are more likely to develop this condition if you go swimming often. What are the signs or symptoms? The first symptom of this condition is often itching in the ear. Later symptoms of the condition include: Swelling of the ear. Redness in the ear. Ear pain. The pain may get worse when you pull on your ear. Pus coming from the ear. How is this diagnosed? This condition may be diagnosed by examining the ear and testing fluid from the ear for bacteria and funguses. How is this treated? This condition may be treated with: Antibiotic ear drops. These are often given for 10-14 days. Medicines to reduce itching and swelling. Follow these instructions at home: If you were prescribed antibiotic ear drops, use them as told by your health care provider. Do not stop using the antibiotic even if you start to feel better. Take over-the-counter and prescription medicines only as told by your health care provider. Avoid getting water in your ears as told by your health care provider. This may include avoiding swimming or water sports for a few days. Keep all follow-up visits. This is important. How is this prevented? Keep your ears dry. Use the corner of a towel to dry your ears after you swim or bathe. Avoid scratching or putting things in your ear. Doing these things can damage the ear canal or remove the protective wax that lines it, which makes it easier for bacteria and funguses to grow. Avoid swimming in lakes, polluted water, or swimming  pools that may not have enough chlorine. Contact a health care provider if: You have a fever. Your ear is still red, swollen, painful, or draining pus after 3 days. Your redness, swelling, or pain gets worse. You have a severe headache. Get help right away if: You have redness, swelling, and pain or tenderness in the area behind your ear. Summary Otitis externa is an infection of the outer ear canal. Common causes include swimming in dirty water, moisture in the ear, or a cut or scrape in the ear. Symptoms include pain, redness, and swelling of the ear canal. If you were prescribed antibiotic ear drops, use them as told by your health care provider. Do not stop using the antibiotic even if you start to feel better. This information is not intended to replace advice given to you by your health care provider. Make sure you discuss any questions you have with your health care provider. Document Revised: 08/04/2020 Document Reviewed: 08/04/2020 Elsevier Patient Education  2023 [employer]  

## 2022-09-04 NOTE — Progress Notes (Addendum)
Subjective:   By signing my name below, I, Daiva Huge, attest that this documentation has been prepared under the direction and in the presence of Ann Held, DO 09/04/22   Patient ID: Jeanne [name], female    DOB: 27-Jun-1980, 42 y.o.   MRN: [id number]  Chief Complaint  Patient presents with   Ear Pain    Right ear, per pt few days ago. Pt states she fell asleep on a ear bud but isn't sure if this is the cause.     HPI Patient is in today for right ear pain.  Reports experiencing right ear pain for past few days. Fell asleep with ear bud in her right ear and believes this may have contributed. Denies sore throat, fever, other symptoms. Taking OTC Tylenol at bedtime.  Past Medical History:  Diagnosis Date   Anxiety    Asthma    Dry eyes    Dry skin    Eczema    Fatigue    Fibroids    GERD (gastroesophageal reflux disease)    H/O echocardiogram    only showed tachycardia   Heartburn    HTN (hypertension)    Hyperlipidemia    Palpitations    Pre-diabetes     pre diabetes, Metformin ordered from weight loss clinic   Rash in adult    Shortness of breath    due to allergies   Stress    Tachycardia    Vitamin D deficiency    Wheezing     Past Surgical History:  Procedure Laterality Date   EYE SURGERY Bilateral 2006   Lasik   HYSTEROSCOPY N/A 05/19/2020   Procedure: HYSTEROSCOPY POLYPECTOMY WITH MYOSURE AND D&C;  Surgeon: Governor Specking, MD;  Location: [facility];  Service: Gynecology;  Laterality: N/A;   LAPAROSCOPIC GASTRIC SLEEVE RESECTION N/A 05/16/2021   Procedure: LAPAROSCOPIC GASTRIC SLEEVE RESECTION;  Surgeon: Greer Pickerel, MD;  Location: [facility];  Service: General;  Laterality: N/A;   MYOMECTOMY N/A 10/30/2017   Procedure: ABDOMINAL MYOMECTOMY;  Surgeon: Lavonia Drafts, MD;  Location: [facility];  Service: Gynecology;  Laterality: N/A;   UPPER GI ENDOSCOPY N/A 05/16/2021   Procedure: UPPER GI ENDOSCOPY;  Surgeon: Greer Pickerel, MD;   Location: [facility];  Service: General;  Laterality: N/A;    Family History  Problem Relation [name] of Onset   Thyroid disease Mother    Fibroids Mother    Deep vein thrombosis Mother    Cancer Maternal Grandmother        liver    Cancer Maternal Grandfather        pancreatic   Hyperlipidemia Father    Hypertension Father    Sleep apnea Father    Obesity Father    Cancer Paternal Grandfather        prostate    Social History   Socioeconomic History   Marital status: Married    Spouse name: [name]   Number of children: Not on file   Years of education: Not on file   Highest education level: Not on file  Occupational History   Occupation: Product manager: [employer]    Comment: 4 th grade  Tobacco Use   Smoking status: Never   Smokeless tobacco: Never  Vaping Use   Vaping Use: Never used  Substance and Sexual Activity   Alcohol use: No    Alcohol/week: 0.0 standard drinks of alcohol   Drug use: No   Sexual activity: Yes  Partners: Male    Birth control/protection: Pill  Other Topics Concern   Not on file  Social History Narrative   Exercise--- walk in summer--- its been difficult during school year   Social Determinants of Health   Financial Resource Strain: Not on file  Food Insecurity: Not on file  Transportation Needs: Not on file  Physical Activity: Not on file  Stress: Not on file  Social Connections: Not on file  Intimate Partner Violence: Not on file    Outpatient Medications Prior to Visit  Medication Sig Dispense Refill   albuterol (VENTOLIN HFA) 108 (90 Base) MCG/ACT inhaler TAKE 2 PUFFS BY MOUTH EVERY 6 HOURS AS NEEDED FOR WHEEZE OR SHORTNESS OF BREATH (Patient taking differently: Inhale 2 puffs into the lungs every 6 (six) hours as needed for wheezing or shortness of breath.) 90 g 1   ALPRAZolam (XANAX) 0.25 MG tablet TAKE 1 TABLET BY MOUTH TWICE A DAY AS NEEDED FOR ANXIETY 20 tablet 0   buPROPion (WELLBUTRIN SR) 200 MG 12 hr  tablet TAKE 1 TABLET BY MOUTH EVERY DAY 90 tablet 1   Calcium Carbonate-Vitamin D (CALTRATE 600+D PO) Take by mouth.     fluticasone (FLONASE) 50 MCG/ACT nasal spray SPRAY 2 SPRAYS INTO EACH NOSTRIL EVERY DAY 48 mL 5   Fluticasone-Umeclidin-Vilant (TRELEGY ELLIPTA) 100-62.5-25 MCG/ACT AEPB Inhale 1 puff into the lungs daily. 1 each 11   megestrol (MEGACE) 40 MG tablet Take 1 tablet (40 mg total) by mouth daily. 60 tablet 0   metoprolol succinate (TOPROL-XL) 100 MG 24 hr tablet TAKE 1 TABLET BY MOUTH EVERY DAY WITH OR IMMEDIATELY FOLLOWING A MEAL 90 tablet 1   Multiple Vitamins-Minerals (BARIATRIC MULTIVITAMINS/IRON PO) Take by mouth.     rosuvastatin (CRESTOR) 10 MG tablet Take 1 tablet (10 mg total) by mouth at bedtime. 90 tablet 1   Semaglutide-Weight Management (WEGOVY) 1.7 MG/0.75ML SOAJ Inject 1.7 mg into the skin once a week. 3 mL 2   Semaglutide-Weight Management (WEGOVY) 2.4 MG/0.75ML SOAJ Inject 2.4 mg into the skin once a week. 3 mL 2   sertraline (ZOLOFT) 100 MG tablet Take 2 tablets (200 mg total) by mouth daily. 180 tablet 2   triamcinolone cream (KENALOG) 0.1 % Apply 1 application topically 2 (two) times daily as needed (for eczema on hands.). 30 g 3   Vitamin D, Ergocalciferol, (DRISDOL) 1.25 MG (50000 UNIT) CAPS capsule TAKE 1 CAPSULE (50,000 UNITS TOTAL) BY MOUTH EVERY 7 (SEVEN) DAYS 12 capsule 1   Semaglutide-Weight Management (WEGOVY) 0.25 MG/0.5ML SOAJ Inject 0.25 mg into the skin once a week. (Patient not taking: Reported on 06/07/2022) 2 mL 0   No facility-administered medications prior to visit.    No Known Allergies  Review of Systems  Constitutional:  Negative for fever and malaise/fatigue.  HENT:  Positive for ear pain (Right). Negative for congestion and sore throat.   Eyes:  Negative for blurred vision.  Respiratory:  Negative for cough and shortness of breath.   Cardiovascular:  Negative for chest pain, palpitations and leg swelling.  Gastrointestinal:  Negative  for vomiting.  Musculoskeletal:  Negative for back pain.  Skin:  Negative for rash.  Neurological:  Negative for loss of consciousness and headaches.       Objective:    Physical Exam Vitals and nursing note reviewed.  Constitutional:      General: She is not in acute distress.    Appearance: Normal appearance. She is not ill-appearing.  HENT:     Head:  Normocephalic and atraumatic.     Right Ear: Hearing and external ear normal. Tenderness present. A middle ear effusion is present.     Left Ear: Hearing, tympanic membrane, ear canal and external ear normal.     Ears:     Comments: Right ear canal erythematous. Eyes:     Extraocular Movements: Extraocular movements intact.     Pupils: Pupils are equal, round, and reactive to light.  Cardiovascular:     Rate and Rhythm: Normal rate and regular rhythm.     Heart sounds: Normal heart sounds. No murmur heard.    No gallop.  Pulmonary:     Effort: Pulmonary effort is normal. No respiratory distress.     Breath sounds: Normal breath sounds. No wheezing or rales.  Skin:    General: Skin is warm and dry.  Neurological:     Mental Status: She is alert and oriented to person, place, and time.  Psychiatric:        Judgment: Judgment normal.     BP 110/80 (BP Location: Left Arm, Patient Position: Sitting, Cuff Size: Large)   Pulse 90   Temp 98.3 F (36.8 C) (Oral)   Resp 18   Ht 5\' 2"  (1.575 m)   Wt 267 lb 3.2 oz (121.2 kg)   SpO2 96%   BMI 48.87 kg/m  Wt Readings from Last 3 Encounters:  09/04/22 267 lb 3.2 oz (121.2 kg)  06/07/22 276 lb (125.2 kg)  05/08/22 275 lb 9.6 oz (125 kg)       Assessment & Plan:  Acute otitis externa of right ear, unspecified type -     Ofloxacin; Place 10 drops into the right ear daily.  Dispense: 5 mL; Refill: 0 -     Cefdinir; Take 1 capsule (300 mg total) by mouth 2 (two) times daily.  Dispense: 10 capsule; Refill: 0     I,Alexander Ruley,acting as a scribe for [employer],  DO.,have documented all relevant documentation on the behalf of Ann Held, DO,as directed by  Ann Held, DO while in the presence of [place], DO, personally preformed the services described in this documentation.  All medical record entries made by the scribe were at my direction and in my presence.  I have reviewed the chart and discharge instructions (if applicable) and agree that the record reflects my personal performance and is accurate and complete. 09/04/22   Ann Held, DO

## 2022-09-05 ENCOUNTER — Other Ambulatory Visit: Payer: Self-pay | Admitting: Family Medicine

## 2022-09-06 ENCOUNTER — Other Ambulatory Visit: Payer: Self-pay

## 2022-09-11 ENCOUNTER — Other Ambulatory Visit (HOSPITAL_COMMUNITY): Payer: Self-pay

## 2022-09-12 ENCOUNTER — Other Ambulatory Visit (HOSPITAL_COMMUNITY): Payer: Self-pay

## 2022-09-12 ENCOUNTER — Other Ambulatory Visit: Payer: Self-pay

## 2022-09-12 MED ORDER — WEGOVY 2.4 MG/0.75ML ~~LOC~~ SOAJ
SUBCUTANEOUS | 2 refills | Status: DC
  Filled 2022-09-12: qty 3, 28d supply, fill #0

## 2022-09-22 ENCOUNTER — Other Ambulatory Visit (HOSPITAL_COMMUNITY): Payer: Self-pay

## 2022-09-22 MED ORDER — OZEMPIC (0.25 OR 0.5 MG/DOSE) 2 MG/3ML ~~LOC~~ SOPN
0.2500 mg | PEN_INJECTOR | SUBCUTANEOUS | 0 refills | Status: DC
  Filled 2022-09-22: qty 3, 42d supply, fill #0

## 2022-09-22 MED ORDER — OZEMPIC (1 MG/DOSE) 4 MG/3ML ~~LOC~~ SOPN
PEN_INJECTOR | SUBCUTANEOUS | 0 refills | Status: DC
  Filled 2022-09-22: qty 3, 28d supply, fill #0

## 2022-09-27 ENCOUNTER — Other Ambulatory Visit (HOSPITAL_COMMUNITY): Payer: Self-pay

## 2022-09-28 ENCOUNTER — Encounter: Payer: Self-pay | Admitting: Family Medicine

## 2022-10-06 ENCOUNTER — Other Ambulatory Visit: Payer: Self-pay | Admitting: Family Medicine

## 2022-10-06 DIAGNOSIS — R002 Palpitations: Secondary | ICD-10-CM

## 2022-10-06 NOTE — Telephone Encounter (Signed)
Pt picked up forms.

## 2022-10-20 ENCOUNTER — Other Ambulatory Visit (HOSPITAL_COMMUNITY): Payer: Self-pay

## 2022-10-20 MED ORDER — OZEMPIC (2 MG/DOSE) 8 MG/3ML ~~LOC~~ SOPN
2.0000 mg | PEN_INJECTOR | SUBCUTANEOUS | 0 refills | Status: DC
  Filled 2022-10-20: qty 3, 30d supply, fill #0
  Filled 2022-11-30: qty 3, 30d supply, fill #1
  Filled 2022-12-29: qty 3, 30d supply, fill #2

## 2022-10-25 ENCOUNTER — Ambulatory Visit: Payer: BC Managed Care – PPO | Admitting: Obstetrics & Gynecology

## 2022-10-25 ENCOUNTER — Encounter: Payer: Self-pay | Admitting: Obstetrics & Gynecology

## 2022-10-25 VITALS — BP 119/63 | HR 87 | Wt 259.0 lb

## 2022-10-25 DIAGNOSIS — N939 Abnormal uterine and vaginal bleeding, unspecified: Secondary | ICD-10-CM

## 2022-10-25 NOTE — Progress Notes (Signed)
History:  42 y.o. G0P0000 here today for f/u of continued AUB. Pt has been on LoEstrin 1/20 with continued bleeding.  She and her spouse are in the process of qualifying for adoption.  She denies pain with the bleeding.      The following portions of the patient's history were reviewed and updated as appropriate: allergies, current medications, past family history, past medical history, past social history, past surgical history and problem list.  Review of Systems:  Pertinent items are noted in HPI.    Objective:  Physical Exam Blood pressure 119/63, pulse 87, weight 259 lb (117.5 kg), last menstrual period 09/29/2022.  CONSTITUTIONAL: Well-developed, well-nourished female in no acute distress.  HENT:  Normocephalic, atraumatic EYES: Conjunctivae and EOM are normal. No scleral icterus.  NECK: Normal range of motion SKIN: Skin is warm and dry. No rash noted. Not diaphoretic.No pallor. NEUROLGIC: Alert and oriented to person, place, and time. Normal coordination.   05/08/2022 CLINICAL DATA:  Vaginal bleeding   EXAM: TRANSABDOMINAL AND TRANSVAGINAL ULTRASOUND OF PELVIS   TECHNIQUE: Both transabdominal and transvaginal ultrasound examinations of the pelvis were performed. Transabdominal technique was performed for global imaging of the pelvis including uterus, ovaries, adnexal regions, and pelvic cul-de-sac. It was necessary to proceed with endovaginal exam following the transabdominal exam to visualize the endometrium.   COMPARISON:  None Available.   FINDINGS: Uterus   Measurements: 10.3 x 6.4 x 7.9 cm = volume: 269.6 mL. Within the anterior uterine body there is a 2.7 x 2.6 x 3.5 cm intramural fibroid.   Endometrium   Thickness: 10 mm.  No focal abnormality visualized.   Right ovary   Not visualized   Left ovary   Not visualized   Other findings   No abnormal free fluid.   IMPRESSION: 1. Fibroid uterus. 2. Endometrium measures 10 mm. If bleeding remains  unresponsive to hormonal or medical therapy, sonohysterogram should be considered for focal lesion work-up. (Ref: Radiological Reasoning: Algorithmic Workup of Abnormal Vaginal Bleeding with Endovaginal Sonography and Sonohysterography. AJR 2008; 161:W96-04)    Assessment & Plan:  Diagnoses and all orders for this visit:  Abnormal uterine bleeding (AUB) -     Norethindrone Acetate-Ethinyl Estradiol (LOESTRIN 1.5/30, 21,) 1.5-30 MG-MCG tablet; Take 1 tablet by mouth daily.   Will f/u the LoEstrin 1/20 and increase the progestin component.   F/u in 3 months or sooner prn   Ezel Vallone L. Harraway-Smith, M.D., [facility]

## 2022-10-27 ENCOUNTER — Encounter: Payer: Self-pay | Admitting: Obstetrics & Gynecology

## 2022-10-27 MED ORDER — NORETHINDRONE ACET-ETHINYL EST 1.5-30 MG-MCG PO TABS: 1.0000 | ORAL_TABLET | Freq: Every day | ORAL | 11 refills | Status: DC

## 2022-10-30 ENCOUNTER — Encounter: Payer: Self-pay | Admitting: Obstetrics & Gynecology

## 2022-11-24 ENCOUNTER — Other Ambulatory Visit: Payer: Self-pay | Admitting: Family Medicine

## 2023-01-11 ENCOUNTER — Other Ambulatory Visit (HOSPITAL_COMMUNITY): Payer: Self-pay

## 2023-01-11 MED ORDER — OZEMPIC (2 MG/DOSE) 8 MG/3ML ~~LOC~~ SOPN
2.0000 mg | PEN_INJECTOR | SUBCUTANEOUS | 0 refills | Status: DC
  Filled 2023-01-11: qty 9, 84d supply, fill #0

## 2023-01-13 ENCOUNTER — Other Ambulatory Visit (HOSPITAL_COMMUNITY): Payer: Self-pay

## 2023-01-23 ENCOUNTER — Other Ambulatory Visit (HOSPITAL_COMMUNITY): Payer: Self-pay

## 2023-02-01 ENCOUNTER — Encounter: Payer: Self-pay | Admitting: Family Medicine

## 2023-02-02 ENCOUNTER — Other Ambulatory Visit: Payer: Self-pay | Admitting: Family Medicine

## 2023-02-02 DIAGNOSIS — E785 Hyperlipidemia, unspecified: Secondary | ICD-10-CM

## 2023-02-02 DIAGNOSIS — E118 Type 2 diabetes mellitus with unspecified complications: Secondary | ICD-10-CM

## 2023-02-02 DIAGNOSIS — I10 Essential (primary) hypertension: Secondary | ICD-10-CM

## 2023-02-07 ENCOUNTER — Encounter: Payer: Self-pay | Admitting: Obstetrics & Gynecology

## 2023-02-07 ENCOUNTER — Other Ambulatory Visit (HOSPITAL_COMMUNITY): Payer: Self-pay

## 2023-02-07 ENCOUNTER — Other Ambulatory Visit (INDEPENDENT_AMBULATORY_CARE_PROVIDER_SITE_OTHER): Payer: BC Managed Care – PPO

## 2023-02-07 ENCOUNTER — Ambulatory Visit: Payer: BC Managed Care – PPO | Admitting: Obstetrics & Gynecology

## 2023-02-07 VITALS — BP 113/62 | HR 78 | Wt 276.0 lb

## 2023-02-07 DIAGNOSIS — E118 Type 2 diabetes mellitus with unspecified complications: Secondary | ICD-10-CM | POA: Diagnosis not present

## 2023-02-07 DIAGNOSIS — I10 Essential (primary) hypertension: Secondary | ICD-10-CM | POA: Diagnosis not present

## 2023-02-07 DIAGNOSIS — N939 Abnormal uterine and vaginal bleeding, unspecified: Secondary | ICD-10-CM | POA: Diagnosis not present

## 2023-02-07 DIAGNOSIS — E785 Hyperlipidemia, unspecified: Secondary | ICD-10-CM | POA: Diagnosis not present

## 2023-02-07 LAB — CBC WITH DIFFERENTIAL/PLATELET
Basophils Absolute: 0.1 10*3/uL (ref 0.0–0.1)
Basophils Relative: 0.6 % (ref 0.0–3.0)
Eosinophils Absolute: 0.4 10*3/uL (ref 0.0–0.7)
Eosinophils Relative: 4.8 % (ref 0.0–5.0)
HCT: 43 % (ref 36.0–46.0)
Hemoglobin: 14.2 g/dL (ref 12.0–15.0)
Lymphocytes Relative: 26.1 % (ref 12.0–46.0)
Lymphs Abs: 2.3 10*3/uL (ref 0.7–4.0)
MCHC: 33.1 g/dL (ref 30.0–36.0)
MCV: 94.2 fl (ref 78.0–100.0)
Monocytes Absolute: 0.5 10*3/uL (ref 0.1–1.0)
Monocytes Relative: 5.9 % (ref 3.0–12.0)
Neutro Abs: 5.5 10*3/uL (ref 1.4–7.7)
Neutrophils Relative %: 62.6 % (ref 43.0–77.0)
Platelets: 274 10*3/uL (ref 150.0–400.0)
RBC: 4.56 Mil/uL (ref 3.87–5.11)
RDW: 12.9 % (ref 11.5–15.5)
WBC: 8.7 10*3/uL (ref 4.0–10.5)

## 2023-02-07 LAB — LIPID PANEL
Cholesterol: 159 mg/dL (ref 0–200)
HDL: 45.9 mg/dL (ref >39.00)
LDL Cholesterol: 95 mg/dL (ref 0–99)
NonHDL: 113.36
Total CHOL/HDL Ratio: 3
Triglycerides: 90 mg/dL (ref 0.0–149.0)
VLDL: 18 mg/dL (ref 0.0–40.0)

## 2023-02-07 LAB — COMPREHENSIVE METABOLIC PANEL
ALT: 13 U/L (ref 0–35)
AST: 14 U/L (ref 0–37)
Albumin: 3.7 g/dL (ref 3.5–5.2)
Alkaline Phosphatase: 62 U/L (ref 39–117)
BUN: 11 mg/dL (ref 6–23)
CO2: 23 meq/L (ref 19–32)
Calcium: 8.8 mg/dL (ref 8.4–10.5)
Chloride: 107 meq/L (ref 96–112)
Creatinine, Ser: 0.62 mg/dL (ref 0.40–1.20)
GFR: 111.55 mL/min (ref >60.00)
Glucose, Bld: 83 mg/dL (ref 70–99)
Potassium: 4.2 meq/L (ref 3.5–5.1)
Sodium: 138 meq/L (ref 135–145)
Total Bilirubin: 0.5 mg/dL (ref 0.2–1.2)
Total Protein: 6.4 g/dL (ref 6.0–8.3)

## 2023-02-07 LAB — MICROALBUMIN / CREATININE URINE RATIO
Creatinine,U: 145.7 mg/dL
Microalb Creat Ratio: 0.5 mg/g (ref 0.0–30.0)
Microalb, Ur: <0.7 mg/dL (ref 0.0–1.9)

## 2023-02-07 LAB — HEMOGLOBIN A1C: Hgb A1c MFr Bld: 5.8 % (ref 4.6–6.5)

## 2023-02-07 NOTE — Progress Notes (Signed)
History:  42 y.o. G0P0000 here today for f/u of AUB. Soon after last visit pt was chosen to adopt a newborn female. He is 3 months now!  She has pics! She has had no further bleeding problems and wants to cont OCPs.  She's currently on parental leave. Returns to work in Oct.    The following portions of the patient's history were reviewed and updated as appropriate: allergies, current medications, past family history, past medical history, past social history, past surgical history and problem list.  Review of Systems:  Pertinent items are noted in HPI.    Objective:  Physical Exam Blood pressure 113/62, pulse 78, weight 276 lb (125.2 kg). CONSTITUTIONAL: Well-developed, well-nourished female in no acute distress.  HENT:  Normocephalic, atraumatic EYES: Conjunctivae and EOM are normal. No scleral icterus.  NECK: Normal range of motion SKIN: Skin is warm and dry. No rash noted. Not diaphoretic.No pallor. NEUROLGIC: Alert and oriented to person, place, and time. Normal coordination.   Assessment & Plan:  AUB bleeding well controlled with OCPs.  Keep LoEstrin 1/20 1 po q day  Last PAP was 2020 F/u for annual    Nya Monds L. Harraway-Smith, M.D., [facility]

## 2023-02-07 NOTE — Progress Notes (Signed)
Patient reports improvement in bleeding with increased Loestrin dosage. Armandina Stammer RN

## 2023-02-20 ENCOUNTER — Other Ambulatory Visit: Payer: Self-pay | Admitting: Psychiatry

## 2023-02-20 DIAGNOSIS — F3342 Major depressive disorder, recurrent, in full remission: Secondary | ICD-10-CM

## 2023-02-20 DIAGNOSIS — F411 Generalized anxiety disorder: Secondary | ICD-10-CM

## 2023-02-26 ENCOUNTER — Other Ambulatory Visit: Payer: Self-pay | Admitting: Psychiatry

## 2023-02-26 DIAGNOSIS — F3342 Major depressive disorder, recurrent, in full remission: Secondary | ICD-10-CM

## 2023-02-26 DIAGNOSIS — F411 Generalized anxiety disorder: Secondary | ICD-10-CM

## 2023-03-03 ENCOUNTER — Other Ambulatory Visit (HOSPITAL_COMMUNITY): Payer: Self-pay

## 2023-03-06 ENCOUNTER — Other Ambulatory Visit (HOSPITAL_COMMUNITY): Payer: Self-pay

## 2023-03-07 ENCOUNTER — Ambulatory Visit: Payer: BC Managed Care – PPO | Admitting: Obstetrics & Gynecology

## 2023-03-21 ENCOUNTER — Other Ambulatory Visit: Payer: Self-pay | Admitting: Psychiatry

## 2023-03-21 ENCOUNTER — Ambulatory Visit: Payer: BC Managed Care – PPO | Admitting: Obstetrics & Gynecology

## 2023-03-21 DIAGNOSIS — F3342 Major depressive disorder, recurrent, in full remission: Secondary | ICD-10-CM

## 2023-03-21 DIAGNOSIS — F411 Generalized anxiety disorder: Secondary | ICD-10-CM

## 2023-03-23 ENCOUNTER — Other Ambulatory Visit (HOSPITAL_COMMUNITY): Payer: Self-pay

## 2023-03-29 ENCOUNTER — Other Ambulatory Visit: Payer: Self-pay

## 2023-04-04 ENCOUNTER — Other Ambulatory Visit (HOSPITAL_COMMUNITY): Payer: Self-pay

## 2023-04-10 ENCOUNTER — Other Ambulatory Visit (HOSPITAL_COMMUNITY): Payer: Self-pay

## 2023-04-11 ENCOUNTER — Other Ambulatory Visit (HOSPITAL_COMMUNITY): Payer: Self-pay

## 2023-04-18 ENCOUNTER — Ambulatory Visit: Payer: BC Managed Care – PPO | Admitting: Obstetrics & Gynecology

## 2023-04-18 ENCOUNTER — Encounter: Payer: Self-pay | Admitting: Psychiatry

## 2023-04-18 ENCOUNTER — Encounter: Payer: Self-pay | Admitting: Obstetrics & Gynecology

## 2023-04-20 ENCOUNTER — Other Ambulatory Visit (HOSPITAL_COMMUNITY): Payer: Self-pay

## 2023-05-04 ENCOUNTER — Other Ambulatory Visit (HOSPITAL_COMMUNITY): Payer: Self-pay

## 2023-05-08 ENCOUNTER — Other Ambulatory Visit (HOSPITAL_COMMUNITY): Payer: Self-pay

## 2023-05-31 ENCOUNTER — Other Ambulatory Visit (HOSPITAL_COMMUNITY): Payer: Self-pay

## 2023-06-14 ENCOUNTER — Other Ambulatory Visit: Payer: Self-pay | Admitting: Family Medicine

## 2023-07-09 ENCOUNTER — Other Ambulatory Visit: Payer: Self-pay | Admitting: Family Medicine

## 2023-07-13 ENCOUNTER — Other Ambulatory Visit (HOSPITAL_COMMUNITY): Payer: Self-pay

## 2023-07-23 ENCOUNTER — Other Ambulatory Visit (HOSPITAL_COMMUNITY): Payer: Self-pay

## 2023-08-02 ENCOUNTER — Encounter: Payer: Self-pay | Admitting: Family Medicine

## 2023-08-02 ENCOUNTER — Ambulatory Visit (INDEPENDENT_AMBULATORY_CARE_PROVIDER_SITE_OTHER): Payer: 59 | Admitting: Family Medicine

## 2023-08-02 VITALS — BP 120/70 | HR 80 | Temp 97.8°F | Resp 18 | Ht 62.0 in | Wt 291.6 lb

## 2023-08-02 DIAGNOSIS — L600 Ingrowing nail: Secondary | ICD-10-CM

## 2023-08-02 DIAGNOSIS — G5603 Carpal tunnel syndrome, bilateral upper limbs: Secondary | ICD-10-CM | POA: Diagnosis not present

## 2023-08-02 MED ORDER — DOXYCYCLINE HYCLATE 100 MG PO TABS: 100.0000 mg | ORAL_TABLET | Freq: Two times a day (BID) | ORAL | 0 refills | Status: DC

## 2023-08-02 NOTE — Progress Notes (Signed)
 Established Patient Office Visit  Subjective   Patient ID: Jeanne [name], female    DOB: 24-Oct-1980  Age: 43 y.o. MRN: [id number]  Chief Complaint  Patient presents with   Nail Problem    Left big toe, x1 week, no injury    HPI Discussed the use of AI scribe software for clinical note transcription with the patient, who gave verbal consent to proceed.  History of Present Illness   Jeanne [name] is a 43 year old female who presents with an infected left big toe.  The infection in her left big toe has been present for approximately one week, causing pain and swelling. This has made it difficult for her to wear shoes, leading her to opt for sandals. She suspects that her previous footwear may have contributed to the issue by pressing against the toe. She has been soaking the toe in soap and water as part of her home care regimen. No swelling is noted in the whole leg, with swelling localized to the toe.  She experiences numbness and tingling in both hands, particularly at night, which sometimes wakes her up. The sensation is described as 'pins and needles' and does not affect the pinky fingers. The symptoms occur during the day as well, especially when typing or using her phone. She is right-handed and notes that both hands are affected equally. There is a family history of carpal tunnel syndrome, as her sister underwent surgery for the condition.      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   Preventative health care 01/11/2021   Vitamin D deficiency, unspecified 01/11/2021   Insomnia 08/17/2018   Anxiety 07/23/2018   Major depression 07/23/2018   Prediabetes 06/10/2018   Vitamin D deficiency 06/10/2018   Class 3 severe  obesity with serious comorbidity and body mass index (BMI) of 50.0 to 59.9 in adult [facility]) 06/10/2018   Fibroids 10/30/2017   S/P myomectomy 10/30/2017   Eczema of both hands 07/03/2017   Sinus tachycardia 07/03/2017   Asthma 12/15/2016   HTN (hypertension) 01/25/2016   Eczema 03/04/2015   Past Medical History:  Diagnosis Date   Anxiety    Asthma    Dry eyes    Dry skin    Eczema    Fatigue    Fibroids    GERD (gastroesophageal reflux disease)    H/O echocardiogram    only showed tachycardia   Heartburn    HTN (hypertension)    Hyperlipidemia    Palpitations    Pre-diabetes     pre diabetes, Metformin ordered from weight loss clinic   Rash in adult    Shortness of breath    due to allergies   Stress    Tachycardia    Vitamin D deficiency    Wheezing    Past Surgical History:  Procedure Laterality Date   EYE SURGERY Bilateral 2006   Lasik   HYSTEROSCOPY N/A 05/19/2020   Procedure: HYSTEROSCOPY POLYPECTOMY WITH MYOSURE AND D&C;  Surgeon: Fermin Schwab, MD;  Location: [facility];  Service: Gynecology;  Laterality: N/A;   LAPAROSCOPIC GASTRIC SLEEVE RESECTION N/A 05/16/2021   Procedure: LAPAROSCOPIC GASTRIC SLEEVE RESECTION;  Surgeon: Gaynelle Adu, MD;  Location: [facility];  Service: General;  Laterality: N/A;  MYOMECTOMY N/A 10/30/2017   Procedure: ABDOMINAL MYOMECTOMY;  Surgeon: Willodean Rosenthal, MD;  Location: [facility];  Service: Gynecology;  Laterality: N/A;   UPPER GI ENDOSCOPY N/A 05/16/2021   Procedure: UPPER GI ENDOSCOPY;  Surgeon: Gaynelle Adu, MD;  Location: [facility];  Service: General;  Laterality: N/A;   Social History   Tobacco Use   Smoking status: Never   Smokeless tobacco: Never  Vaping Use   Vaping status: Never Used  Substance Use Topics   Alcohol use: No    Alcohol/week: 0.0 standard drinks of alcohol   Drug use: No   Social History   Socioeconomic History   Marital status: Married    Spouse name: Beverely Pace   Number of children: Not on file    Years of education: Not on file   Highest education level: Not on file  Occupational History   Occupation: Magazine features editor: [employer]    Comment: 4 th grade  Tobacco Use   Smoking status: Never   Smokeless tobacco: Never  Vaping Use   Vaping status: Never Used  Substance and Sexual Activity   Alcohol use: No    Alcohol/week: 0.0 standard drinks of alcohol   Drug use: No   Sexual activity: Yes    Partners: Male    Birth control/protection: Pill  Other Topics Concern   Not on file  Social History Narrative   Exercise--- walk in summer--- its been difficult during school year   Social Drivers of Corporate investment banker Strain: Not on file  Food Insecurity: Not on file  Transportation Needs: Not on file  Physical Activity: Not on file  Stress: Not on file  Social Connections: Unknown (10/10/2021)   [facility]   Social Network    Social Network: Not on file  Intimate Partner Violence: Unknown (09/09/2021)   Received from [facility], [facility]   HITS    Physically Hurt: Not on file    Insult or Talk Down To: Not on file    Threaten Physical Harm: Not on file    Scream or Curse: Not on file   Family Status  Relation Name Status   Mother  Alive   MGM  Deceased   MGF  Deceased   Father  Alive   PGM  Alive   PGF  (Not Specified)  No partnership data on file   Family History  Problem Relation Age of Onset   Thyroid disease Mother    Fibroids Mother    Deep vein thrombosis Mother    Cancer Maternal Grandmother        liver    Cancer Maternal Grandfather        pancreatic   Hyperlipidemia Father    Hypertension Father    Sleep apnea Father    Obesity Father    Cancer Paternal Grandfather        prostate   No Known Allergies    Review of Systems  Constitutional:  Negative for chills, fever and malaise/fatigue.  HENT:  Negative for congestion and hearing loss.   Eyes:  Negative for blurred vision  and discharge.  Respiratory:  Negative for cough, sputum production and shortness of breath.   Cardiovascular:  Negative for chest pain, palpitations and leg swelling.  Gastrointestinal:  Negative for abdominal pain, blood in stool, constipation, diarrhea, heartburn, nausea and vomiting.  Genitourinary:  Negative for dysuria, frequency, hematuria and urgency.  Musculoskeletal:  Negative for back pain,  falls and myalgias.  Skin:  Negative for rash.  Neurological:  Negative for dizziness, sensory change, loss of consciousness, weakness and headaches.  Endo/Heme/Allergies:  Negative for environmental allergies. Does not bruise/bleed easily.  Psychiatric/Behavioral:  Negative for depression and suicidal ideas. The patient is not nervous/anxious and does not have insomnia.       Objective:     BP 120/70 (BP Location: Left Arm, Patient Position: Sitting)   Pulse 80   Temp 97.8 F (36.6 C) (Oral)   Resp 18   Ht 5\' 2"  (1.575 m)   Wt 291 lb 9.6 oz (132.3 kg)   SpO2 97%   BMI 53.33 kg/m  BP Readings from Last 3 Encounters:  08/02/23 120/70  02/07/23 113/62  10/25/22 119/63   Wt Readings from Last 3 Encounters:  08/02/23 291 lb 9.6 oz (132.3 kg)  02/07/23 276 lb (125.2 kg)  10/25/22 259 lb (117.5 kg)   SpO2 Readings from Last 3 Encounters:  08/02/23 97%  09/04/22 96%  05/08/22 96%      Physical Exam Vitals and nursing note reviewed.  Constitutional:      General: She is not in acute distress.    Appearance: Normal appearance. She is well-developed.  HENT:     Head: Normocephalic and atraumatic.  Eyes:     General: No scleral icterus.       Right eye: No discharge.        Left eye: No discharge.  Cardiovascular:     Rate and Rhythm: Normal rate and regular rhythm.     Heart sounds: No murmur heard. Pulmonary:     Effort: Pulmonary effort is normal. No respiratory distress.     Breath sounds: Normal breath sounds.  Musculoskeletal:        General: Normal range of  motion.     Cervical back: Normal range of motion and neck supple.     Right lower leg: No edema.     Left lower leg: No edema.  Skin:    General: Skin is warm and dry.     Comments: Big toe L foot---  toenail ingrown and infected   Neurological:     Mental Status: She is alert and oriented to person, place, and time.  Psychiatric:        Mood and Affect: Mood normal.        Behavior: Behavior normal.        Thought Content: Thought content normal.        Judgment: Judgment normal.      No results found for any visits on 08/02/23.  Last CBC Lab Results  Component Value Date   WBC 8.7 02/07/2023   HGB 14.2 02/07/2023   HCT 43.0 02/07/2023   MCV 94.2 02/07/2023   MCH 31.0 05/17/2021   RDW 12.9 02/07/2023   PLT 274.0 02/07/2023   Last metabolic panel Lab Results  Component Value Date   GLUCOSE 83 02/07/2023   NA 138 02/07/2023   K 4.2 02/07/2023   CL 107 02/07/2023   CO2 23 02/07/2023   BUN 11 02/07/2023   CREATININE 0.62 02/07/2023   GFR 111.55 02/07/2023   CALCIUM 8.8 02/07/2023   PROT 6.4 02/07/2023   ALBUMIN 3.7 02/07/2023   LABGLOB 2.2 12/12/2018   AGRATIO 2.0 12/12/2018   BILITOT 0.5 02/07/2023   ALKPHOS 62 02/07/2023   AST 14 02/07/2023   ALT 13 02/07/2023   ANIONGAP 7 05/17/2021   Last lipids Lab Results  Component Value Date  CHOL 159 02/07/2023   HDL 45.90 02/07/2023   LDLCALC 95 02/07/2023   TRIG 90.0 02/07/2023   CHOLHDL 3 02/07/2023   Last hemoglobin A1c Lab Results  Component Value Date   HGBA1C 5.8 02/07/2023   Last thyroid functions Lab Results  Component Value Date   TSH 1.74 05/08/2022   T3TOTAL 97 12/12/2018   T4TOTAL 6.2 05/08/2022   Last vitamin D Lab Results  Component Value Date   VD25OH 38.53 05/08/2022   Last vitamin B12 and Folate Lab Results  Component Value Date   VITAMINB12 302 05/08/2022   FOLATE 19.4 09/10/2017      The 10-year ASCVD risk score (Arnett DK, et al., 2019) is: 1.2%    Assessment &  Plan:   Problem List Items Addressed This Visit   None Visit Diagnoses       Ingrown toenail of left foot with infection    -  Primary   Relevant Medications   doxycycline (VIBRA-TABS) 100 MG tablet   Other Relevant Orders   Ambulatory referral to Podiatry     Assessment and Plan    Infected Left Big Toe The left big toe has been symptomatic for one week, likely due to trauma from footwear, with swelling and pain localized to the toe. Antibiotics are prescribed, and a referral to a podiatrist in [place] is made. Continue soaking the toe in soap and water.  Bilateral Carpal Tunnel Syndrome Nocturnal numbness and tingling in both hands, sparing the pinky fingers, are reported. Symptoms worsen with typing and phone use, suggesting carpal tunnel syndrome. Conservative management with splints is discussed, as non-surgical options are preferred due to concerns about recovery time and work absence. A carpal tunnel splint is provided for the right hand, with advice to wear splints at night and during exacerbating activities. If symptoms persist, a referral to a hand specialist will be considered.  Plantar Fasciitis Plantar fasciitis is diagnosed with no current treatment or follow-up plan.        Return if symptoms worsen or fail to improve.    Donato Schultz, DO

## 2023-08-02 NOTE — Patient Instructions (Signed)
 Ingrown Toenail  An ingrown toenail occurs when the corner or sides of a toenail grow into the surrounding skin. This causes discomfort and pain. The big toe is most commonly affected, but any of the toes can be affected. If an ingrown toenail is not treated, it can become infected. What are the causes? This condition may be caused by: Wearing shoes that are too small or tight. An injury, such as stubbing your toe or having your toe stepped on. Improper cutting or care of your toenails. Having nail or foot abnormalities that were present from birth (congenital abnormalities), such as having a nail that is too big for your toe. What increases the risk? The following factors may make you more likely to develop ingrown toenails: Age. Nails tend to get thicker with age, so ingrown nails are more common among older people. Cutting your toenails incorrectly, such as cutting them very short or cutting them unevenly. An ingrown toenail is more likely to get infected if you have: Diabetes. Blood flow (circulation) problems. What are the signs or symptoms? Symptoms of an ingrown toenail may include: Pain, soreness, or tenderness. Redness. Swelling. Hardening of the skin that surrounds the toenail. Signs that an ingrown toenail may be infected include: Fluid or pus. Symptoms that get worse. How is this diagnosed? Ingrown toenails may be diagnosed based on: Your symptoms and medical history. A physical exam. Labs or tests. If you have fluid or blood coming from your toenail, a sample may be collected to test for the specific type of bacteria that is causing the infection. How is this treated? Treatment depends on the severity of your symptoms. You may be able to care for your toenail at home. If you have an infection, you may be prescribed antibiotic medicines. If you have fluid or pus draining from your toenail, your health care provider may drain it. If you have trouble walking, you may be  given crutches to use. If you have a severe or infected ingrown toenail, you may need a procedure to remove part or all of the nail. Follow these instructions at home: Foot care  Check your wound every day for signs of infection, or as often as told by your health care provider. Check for: More redness, swelling, or pain. More fluid or blood. Warmth. Pus or a bad smell. Do not pick at your toenail or try to remove it yourself. Soak your foot in warm, soapy water. Do this for 20 minutes, 3 times a day, or as often as told by your health care provider. This helps to keep your toe clean and your skin soft. Wear shoes that fit well and are not too tight. Your health care provider may recommend that you wear open-toed shoes while you heal. Trim your toenails regularly and carefully. Cut your toenails straight across to prevent injury to the skin at the corners of the toenail. Do not cut your nails in a curved shape. Keep your feet clean and dry to help prevent infection. General instructions Take over-the-counter and prescription medicines only as told by your health care provider. If you were prescribed an antibiotic, take it as told by your health care provider. Do not stop taking the antibiotic even if you start to feel better. If your health care provider told you to use crutches to help you move around, use them as instructed. Return to your normal activities as told by your health care provider. Ask your health care provider what activities are safe for you.  Keep all follow-up visits. This is important. Contact a health care provider if: You have more redness, swelling, pain, or other symptoms that do not improve with treatment. You have fluid, blood, or pus coming from your toenail. You have a red streak on your skin that starts at your foot and spreads up your leg. You have a fever. Summary An ingrown toenail occurs when the corner or sides of a toenail grow into the surrounding skin.  This causes discomfort and pain. The big toe is most commonly affected, but any of the toes can be affected. If an ingrown toenail is not treated, it can become infected. Fluid or pus draining from your toenail is a sign of infection. Your health care provider may need to drain it. You may be given antibiotics to treat the infection. Trimming your toenails regularly and properly can help you prevent an ingrown toenail. This information is not intended to replace advice given to you by your health care provider. Make sure you discuss any questions you have with your health care provider. Document Revised: 09/21/2020 Document Reviewed: 09/21/2020 Elsevier Patient Education  2024 [employer].

## 2023-08-22 ENCOUNTER — Ambulatory Visit: Admitting: Obstetrics & Gynecology

## 2023-08-22 ENCOUNTER — Other Ambulatory Visit (HOSPITAL_COMMUNITY)
Admission: RE | Admit: 2023-08-22 | Discharge: 2023-08-22 | Disposition: A | Discharge status: Home / Self Care | Source: Ambulatory Visit | Attending: Obstetrics & Gynecology | Admitting: Obstetrics & Gynecology

## 2023-08-22 ENCOUNTER — Encounter: Payer: Self-pay | Admitting: Obstetrics & Gynecology

## 2023-08-22 VITALS — BP 116/72 | HR 98 | Ht 62.0 in | Wt 293.0 lb

## 2023-08-22 DIAGNOSIS — N939 Abnormal uterine and vaginal bleeding, unspecified: Secondary | ICD-10-CM

## 2023-08-22 DIAGNOSIS — Z01419 Encounter for gynecological examination (general) (routine) without abnormal findings: Secondary | ICD-10-CM | POA: Diagnosis present

## 2023-08-22 MED ORDER — NORETHINDRONE ACET-ETHINYL EST 1.5-30 MG-MCG PO TABS: 1.0000 | ORAL_TABLET | Freq: Every day | ORAL | 4 refills | Status: AC

## 2023-08-22 NOTE — Progress Notes (Signed)
 Subjective:     Jeanne [name] is a 43 y.o. female here for a routine exam.  Current complaints: none. Baby is doing well. Sleeping 10 hours at night. Jeanne [name] loves being a mom!  On OCPs and doing well.     Gynecologic History Patient's last menstrual period was 07/29/2023 (exact date). Contraception: OCP (estrogen/progesterone) Last Pap: 06/07/2018. Results were: normal Last mammogram: to be scheduled at [facility]   Obstetric History OB History  Gravida Para Term Preterm AB Living  0 0 0 0 0 0  SAB IAB Ectopic Multiple Live Births  0 0 0 0 0     The following portions of the patient's history were reviewed and updated as appropriate: allergies, current medications, past family history, past medical history, past social history, past surgical history, and problem list.  Review of Systems Pertinent items are noted in HPI.    Objective:  BP 116/72 (BP Location: Right Arm, Patient Position: Sitting, Cuff Size: Large)   Pulse 98   Ht 5\' 2"  (1.575 m)   Wt 293 lb (132.9 kg)   LMP 07/29/2023 (Exact Date)   BMI 53.59 kg/m  General Appearance:    Alert, cooperative, no distress, appears stated age  Head:    Normocephalic, without obvious abnormality, atraumatic  Eyes:    conjunctiva/corneas clear, EOM's intact, both eyes  Ears:    Normal external ear canals, both ears  Nose:   Nares normal, septum midline, mucosa normal, no drainage    or sinus tenderness  Throat:   Lips, mucosa, and tongue normal; teeth and gums normal  Neck:   Supple, symmetrical, trachea midline, no adenopathy;    thyroid:  no enlargement/tenderness/nodules  Back:     Symmetric, no curvature, ROM normal, no CVA tenderness  Lungs:     respirations unlabored  Chest Wall:    No tenderness or deformity   Heart:    Regular rate and rhythm  Breast Exam:    No tenderness, masses, or nipple abnormality  Abdomen:     Soft, non-tender, bowel sounds active all four quadrants,    no masses, no organomegaly  Genitalia:     Normal female without lesion, discharge or tenderness     Extremities:   Extremities normal, atraumatic, no cyanosis or edema  Pulses:   2+ and symmetric all extremities  Skin:   Skin color, texture, turgor normal, no rashes or lesions      Assessment:    Healthy female exam.  H/o AUB- Pt requests to cont OCPs. No contraindications.    Plan:  Jeanne [name] was seen today for annual exam.  Diagnoses and all orders for this visit:  Well woman exam with routine gynecological exam -     Cytology - PAP( )  Abnormal uterine bleeding (AUB) -     Norethindrone Acetate-Ethinyl Estradiol (LOESTRIN 1.5/30, 21,) 1.5-30 MG-MCG tablet; Take 1 tablet by mouth daily.   F/u in 1 year or sooner prn  Jeanne [name], M.D., [place] [place]

## 2023-08-23 ENCOUNTER — Ambulatory Visit: Admitting: Podiatry

## 2023-08-23 ENCOUNTER — Encounter: Payer: Self-pay | Admitting: Podiatry

## 2023-08-23 DIAGNOSIS — L600 Ingrowing nail: Secondary | ICD-10-CM | POA: Diagnosis not present

## 2023-08-23 NOTE — Patient Instructions (Signed)

## 2023-08-23 NOTE — Progress Notes (Signed)
 Subjective:  Patient ID: Jeanne [name], female    DOB: 1981/03/09,   MRN: [id number]  No chief complaint on file.   43 y.o. female presents for concern of left ingrown toenail that has been present for several weeks. Relates she has tried to trim out herself and done fine in the past but this time became infected. She was seen by PCP and given antibiotics and told to follow-up with [place].   . Denies any other pedal complaints. Denies n/v/f/c.   Past Medical History:  Diagnosis Date   Anxiety    Asthma    Dry eyes    Dry skin    Eczema    Fatigue    Fibroids    GERD (gastroesophageal reflux disease)    H/O echocardiogram    only showed tachycardia   Heartburn    HTN (hypertension)    Hyperlipidemia    Palpitations    Pre-diabetes     pre diabetes, Metformin ordered from weight loss clinic   Rash in adult    Shortness of breath    due to allergies   Stress    Tachycardia    Vitamin D deficiency    Wheezing     Objective:  Physical Exam: Vascular: DP/PT pulses 2/4 bilateral. CFT <3 seconds. Normal hair growth on digits. No edema.  Skin. No lacerations or abrasions bilateral feet. Incurvation of lateral border of left great toenail. Tender to palpation. No erythema edema or purulence noted.  Musculoskeletal: MMT 5/5 bilateral lower extremities in DF, PF, Inversion and Eversion. Deceased ROM in DF of ankle joint.  Neurological: Sensation intact to light touch.   Assessment:   1. Ingrown left greater toenail      Plan:  Patient was evaluated and treated and all questions answered. Discussed ingrown toenails etiology and treatment options including procedure for removal vs conservative care.  Patient requesting removal of ingrown nail today. Procedure below.  Discussed procedure and post procedure care and patient expressed understanding.  Will follow-up in 2 weeks for nail check or sooner if any problems arise.    Procedure:  Procedure: partial Nail Avulsion of left  hallux lateral nail border.  Surgeon: Louann Sjogren, DPM  Pre-op Dx: Ingrown toenail without infection Post-op: Same  Place of Surgery: Office exam room.  Indications for surgery: Painful and ingrown toenail.    The patient is requesting removal of nail with  chemical matrixectomy. Risks and complications were discussed with the patient for which they understand and written consent was obtained. Under sterile conditions a total of 3 mL of  1% lidocaine plain was infiltrated in a hallux block fashion. Once anesthetized, the skin was prepped in sterile fashion. A tourniquet was then applied. Next the lateral aspect of hallux nail border was then sharply excised making sure to remove the entire offending nail border.  Next phenol was then applied under standard conditions to permanently destroy the matrix and copiously irrigated. Silvadene was applied. A dry sterile dressing was applied. After application of the dressing the tourniquet was removed and there is found to be an immediate capillary refill time to the digit. The patient tolerated the procedure well without any complications. Post procedure instructions were discussed the patient for which he verbally understood. Follow-up in two weeks for nail check or sooner if any problems are to arise. Discussed signs/symptoms of infection and directed to call the office immediately should any occur or go directly to the emergency room. In the meantime, encouraged to call the  office with any questions, concerns, changes symptoms.   Louann Sjogren, DPM

## 2023-08-24 LAB — CYTOLOGY - PAP
Comment: NEGATIVE
Diagnosis: NEGATIVE
High risk HPV: NEGATIVE

## 2023-08-27 ENCOUNTER — Encounter: Payer: Self-pay | Admitting: Obstetrics & Gynecology

## 2023-09-01 ENCOUNTER — Encounter: Payer: Self-pay | Admitting: Podiatry

## 2023-09-06 ENCOUNTER — Ambulatory Visit: Admitting: Podiatry

## 2023-10-13 LAB — HM MAMMOGRAPHY

## 2023-11-14 ENCOUNTER — Encounter: Payer: Self-pay | Admitting: Obstetrics & Gynecology

## 2023-11-15 ENCOUNTER — Other Ambulatory Visit: Payer: Self-pay | Admitting: Family Medicine

## 2023-11-19 ENCOUNTER — Other Ambulatory Visit: Payer: Self-pay

## 2023-11-20 ENCOUNTER — Other Ambulatory Visit: Payer: Self-pay

## 2023-11-21 ENCOUNTER — Other Ambulatory Visit: Payer: Self-pay

## 2023-11-21 DIAGNOSIS — R928 Other abnormal and inconclusive findings on diagnostic imaging of breast: Secondary | ICD-10-CM

## 2023-11-28 ENCOUNTER — Other Ambulatory Visit: Payer: Self-pay | Admitting: Family Medicine

## 2023-11-28 DIAGNOSIS — R002 Palpitations: Secondary | ICD-10-CM

## 2023-11-29 ENCOUNTER — Encounter: Payer: Self-pay | Admitting: Family Medicine

## 2023-11-29 DIAGNOSIS — R002 Palpitations: Secondary | ICD-10-CM

## 2023-11-29 MED ORDER — METOPROLOL SUCCINATE ER 100 MG PO TB24
100.0000 mg | ORAL_TABLET | Freq: Every day | ORAL | 0 refills | Status: DC
Start: 2023-11-29 — End: 2023-12-27

## 2023-12-07 ENCOUNTER — Other Ambulatory Visit: Payer: Self-pay | Admitting: Medical Genetics

## 2023-12-13 ENCOUNTER — Other Ambulatory Visit

## 2023-12-13 DIAGNOSIS — Z006 Encounter for examination for normal comparison and control in clinical research program: Secondary | ICD-10-CM

## 2023-12-14 ENCOUNTER — Ambulatory Visit: Admitting: Family Medicine

## 2023-12-21 ENCOUNTER — Encounter (HOSPITAL_COMMUNITY): Payer: Self-pay | Admitting: *Deleted

## 2023-12-22 LAB — GENECONNECT MOLECULAR SCREEN: Genetic Analysis Overall Interpretation: NEGATIVE

## 2023-12-27 ENCOUNTER — Ambulatory Visit: Admitting: Family Medicine

## 2023-12-27 ENCOUNTER — Encounter: Payer: Self-pay | Admitting: Family Medicine

## 2023-12-27 VITALS — BP 110/70 | HR 89 | Temp 98.2°F | Resp 18 | Ht 62.0 in | Wt 295.4 lb

## 2023-12-27 DIAGNOSIS — R002 Palpitations: Secondary | ICD-10-CM | POA: Diagnosis not present

## 2023-12-27 DIAGNOSIS — R768 Other specified abnormal immunological findings in serum: Secondary | ICD-10-CM | POA: Diagnosis not present

## 2023-12-27 DIAGNOSIS — E118 Type 2 diabetes mellitus with unspecified complications: Secondary | ICD-10-CM | POA: Diagnosis not present

## 2023-12-27 DIAGNOSIS — I10 Essential (primary) hypertension: Secondary | ICD-10-CM | POA: Diagnosis not present

## 2023-12-27 DIAGNOSIS — E559 Vitamin D deficiency, unspecified: Secondary | ICD-10-CM | POA: Diagnosis not present

## 2023-12-27 DIAGNOSIS — Z6841 Body Mass Index (BMI) 40.0 and over, adult: Secondary | ICD-10-CM

## 2023-12-27 DIAGNOSIS — E785 Hyperlipidemia, unspecified: Secondary | ICD-10-CM

## 2023-12-27 DIAGNOSIS — R5383 Other fatigue: Secondary | ICD-10-CM | POA: Diagnosis not present

## 2023-12-27 LAB — COMPREHENSIVE METABOLIC PANEL WITH GFR
ALT: 16 U/L (ref 0–35)
AST: 15 U/L (ref 0–37)
Albumin: 4 g/dL (ref 3.5–5.2)
Alkaline Phosphatase: 84 U/L (ref 39–117)
BUN: 11 mg/dL (ref 6–23)
CO2: 24 meq/L (ref 19–32)
Calcium: 9.4 mg/dL (ref 8.4–10.5)
Chloride: 104 meq/L (ref 96–112)
Creatinine, Ser: 0.59 mg/dL (ref 0.40–1.20)
GFR: 112.2 mL/min (ref >60.00)
Glucose, Bld: 175 mg/dL — ABNORMAL HIGH (ref 70–99)
Potassium: 4 meq/L (ref 3.5–5.1)
Sodium: 138 meq/L (ref 135–145)
Total Bilirubin: 0.5 mg/dL (ref 0.2–1.2)
Total Protein: 6.8 g/dL (ref 6.0–8.3)

## 2023-12-27 LAB — CBC WITH DIFFERENTIAL/PLATELET
Basophils Absolute: 0.1 K/uL (ref 0.0–0.1)
Basophils Relative: 0.9 % (ref 0.0–3.0)
Eosinophils Absolute: 0.6 K/uL (ref 0.0–0.7)
Eosinophils Relative: 7.8 % — ABNORMAL HIGH (ref 0.0–5.0)
HCT: 42.9 % (ref 36.0–46.0)
Hemoglobin: 14.5 g/dL (ref 12.0–15.0)
Lymphocytes Relative: 25.6 % (ref 12.0–46.0)
Lymphs Abs: 1.9 K/uL (ref 0.7–4.0)
MCHC: 33.8 g/dL (ref 30.0–36.0)
MCV: 90.5 fl (ref 78.0–100.0)
Monocytes Absolute: 0.3 K/uL (ref 0.1–1.0)
Monocytes Relative: 4.1 % (ref 3.0–12.0)
Neutro Abs: 4.6 K/uL (ref 1.4–7.7)
Neutrophils Relative %: 61.6 % (ref 43.0–77.0)
Platelets: 293 K/uL (ref 150.0–400.0)
RBC: 4.75 Mil/uL (ref 3.87–5.11)
RDW: 13.1 % (ref 11.5–15.5)
WBC: 7.5 K/uL (ref 4.0–10.5)

## 2023-12-27 LAB — MICROALBUMIN / CREATININE URINE RATIO
Creatinine,U: 183.9 mg/dL
Microalb Creat Ratio: 5 mg/g (ref 0.0–30.0)
Microalb, Ur: 0.9 mg/dL (ref 0.0–1.9)

## 2023-12-27 LAB — VITAMIN D 25 HYDROXY (VIT D DEFICIENCY, FRACTURES): VITD: 19.61 ng/mL — ABNORMAL LOW (ref 30.00–100.00)

## 2023-12-27 LAB — T4, FREE: Free T4: 0.75 ng/dL (ref 0.60–1.60)

## 2023-12-27 LAB — TSH: TSH: 1.77 u[IU]/mL (ref 0.35–5.50)

## 2023-12-27 LAB — LIPID PANEL
Cholesterol: 207 mg/dL — ABNORMAL HIGH (ref 0–200)
HDL: 51.3 mg/dL (ref >39.00)
LDL Cholesterol: 122 mg/dL — ABNORMAL HIGH (ref 0–99)
NonHDL: 156.08
Total CHOL/HDL Ratio: 4
Triglycerides: 171 mg/dL — ABNORMAL HIGH (ref 0.0–149.0)
VLDL: 34.2 mg/dL (ref 0.0–40.0)

## 2023-12-27 LAB — HEMOGLOBIN A1C: Hgb A1c MFr Bld: 6.4 % (ref 4.6–6.5)

## 2023-12-27 MED ORDER — ROSUVASTATIN CALCIUM 10 MG PO TABS: 10.0000 mg | ORAL_TABLET | Freq: Every day | ORAL | 1 refills | Status: AC

## 2023-12-27 MED ORDER — METOPROLOL SUCCINATE ER 100 MG PO TB24: 100.0000 mg | ORAL_TABLET | Freq: Every day | ORAL | 1 refills | Status: DC

## 2023-12-27 NOTE — Progress Notes (Signed)
 Established Patient Office Visit  Subjective   Patient ID: Jeanne [name], female    DOB: 15-Sep-1980  Age: 43 y.o. MRN: [id number]  Chief Complaint  Patient presents with   Hypertension   Follow-up    HPI Discussed the use of AI scribe software for clinical note transcription with the patient, who gave verbal consent to proceed.  History of Present Illness Jeanne [name] is a 43 year old female who presents for medication refills and lab work.  She requires refills for metoprolol  and rosuvastatin . Lab work is needed as requested by her gastric sleeve surgeon to check vitamin levels, and her endocrinologist has also requested lab work.  She underwent gastric sleeve surgery in 2022. Her A1c was elevated prior to the surgery but decreased afterward. She was previously on Wegovy , which was discontinued due to insurance coverage issues. Attempts to get Mounjaro prescribed were denied by insurance as she does not have diabetes. Her A1c has not been checked in three years.  She feels a bulge on the right side of her abdomen, which she is concerned might be her liver. She reports no pain in the area. She denies a history of sleep apnea and is unsure about fatty liver disease. She had fibroid removal surgery in 2018.  She previously took metformin  but experienced adverse gastric issues.   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   Preventative health care 01/11/2021   Vitamin D  deficiency, unspecified 01/11/2021   Insomnia 08/17/2018   Anxiety 07/23/2018   Major depression 07/23/2018   Prediabetes 06/10/2018   Vitamin D  deficiency 06/10/2018   Class 3 severe obesity with serious comorbidity and body mass index (BMI) of 50.0 to  59.9 in adult 06/10/2018   Fibroids 10/30/2017   S/P myomectomy 10/30/2017   Eczema of both hands 07/03/2017   Sinus tachycardia 07/03/2017   Asthma 12/15/2016   HTN (hypertension) 01/25/2016   Eczema 03/04/2015   Past Medical History:  Diagnosis Date   Anxiety    Asthma    Dry eyes    Dry skin    Eczema    Fatigue    Fibroids    GERD (gastroesophageal reflux disease)    H/O echocardiogram    only showed tachycardia   Heartburn    HTN (hypertension)    Hyperlipidemia    Palpitations    Pre-diabetes     pre diabetes, Metformin  ordered from weight loss clinic   Rash in adult    Shortness of breath    due to allergies   Stress    Tachycardia    Vitamin D  deficiency    Wheezing    Past Surgical History:  Procedure Laterality Date   EYE SURGERY Bilateral 2006   Lasik   HYSTEROSCOPY N/A 05/19/2020   Procedure: HYSTEROSCOPY POLYPECTOMY WITH MYOSURE AND D&C;  Surgeon: Yalcinkaya, Tamer, MD;  Location: [facility];  Service: Gynecology;  Laterality: N/A;   LAPAROSCOPIC GASTRIC SLEEVE RESECTION N/A 05/16/2021   Procedure: LAPAROSCOPIC GASTRIC SLEEVE RESECTION;  Surgeon: Tanda Locus, MD;  Location: [facility];  Service: General;  Laterality: N/A;   MYOMECTOMY N/A 10/30/2017   Procedure: ABDOMINAL MYOMECTOMY;  Surgeon: Corene Coy, MD;  Location: [facility];  Service: Gynecology;  Laterality: N/A;  UPPER GI ENDOSCOPY N/A 05/16/2021   Procedure: UPPER GI ENDOSCOPY;  Surgeon: Tanda Locus, MD;  Location: [facility];  Service: General;  Laterality: N/A;   Social History   Tobacco Use   Smoking status: Never   Smokeless tobacco: Never  Vaping Use   Vaping status: Never Used  Substance Use Topics   Alcohol use: No    Alcohol/week: 0.0 standard drinks of alcohol   Drug use: No   Social History   Socioeconomic History   Marital status: Married    Spouse name: Armida   Number of children: Not on file   Years of education: Not on file   Highest education level: Master's  degree (e.g., MA, MS, MEng, MEd, MSW, MBA)  Occupational History   Occupation: Magazine features editor: [employer]    Comment: 4 th grade  Tobacco Use   Smoking status: Never   Smokeless tobacco: Never  Vaping Use   Vaping status: Never Used  Substance and Sexual Activity   Alcohol use: No    Alcohol/week: 0.0 standard drinks of alcohol   Drug use: No   Sexual activity: Yes    Partners: Male    Birth control/protection: Pill  Other Topics Concern   Not on file  Social History Narrative   Exercise--- walk in summer--- its been difficult during school year   Social Drivers of Health   Financial Resource Strain: Low Risk  (12/07/2023)   Overall Financial Resource Strain (CARDIA)    Difficulty of Paying Living Expenses: Not very hard  Food Insecurity: No Food Insecurity (12/07/2023)   Hunger Vital Sign    Worried About Running Out of Food in the Last Year: Never true    Ran Out of Food in the Last Year: Never true  Transportation Needs: No Transportation Needs (12/07/2023)   PRAPARE - Administrator, Civil Service (Medical): No    Lack of Transportation (Non-Medical): No  Physical Activity: Inactive (12/07/2023)   Exercise Vital Sign    Days of Exercise per Week: 0 days    Minutes of Exercise per Session: Not on file  Stress: Stress Concern Present (12/07/2023)   [employer] of Occupational Health - Occupational Stress Questionnaire    Feeling of Stress: To some extent  Social Connections: Socially Integrated (12/07/2023)   Social Connection and Isolation Panel    Frequency of Communication with Friends and Family: More than three times a week    Frequency of Social Gatherings with Friends and Family: Once a week    Attends Religious Services: More than 4 times per year    Active Member of [employer] or Organizations: Yes    Attends Banker Meetings: More than 4 times per year    Marital Status: Married  Catering manager Violence: Unknown (09/09/2021)    Received from [facility]   HITS    Physically Hurt: Not on file    Insult or Talk Down To: Not on file    Threaten Physical Harm: Not on file    Scream or Curse: Not on file   Family Status  Relation Name Status   Mother  Alive   MGM  Deceased   MGF  Deceased   Father  Alive   PGM  Alive   PGF  (Not Specified)  No partnership data on file   Family History  Problem Relation Age of Onset   Thyroid  disease Mother    Fibroids Mother    Deep vein  thrombosis Mother    Cancer Maternal Grandmother        liver    Cancer Maternal Grandfather        pancreatic   Hyperlipidemia Father    Hypertension Father    Sleep apnea Father    Obesity Father    Cancer Paternal Grandfather        prostate   Allergies  Allergen Reactions   Metformin  And Related Diarrhea      Review of Systems  Constitutional:  Negative for fever and malaise/fatigue.  HENT:  Negative for congestion.   Eyes:  Negative for blurred vision.  Respiratory:  Negative for shortness of breath.   Cardiovascular:  Negative for chest pain, palpitations and leg swelling.  Gastrointestinal:  Negative for abdominal pain, blood in stool and nausea.  Genitourinary:  Negative for dysuria and frequency.  Musculoskeletal:  Negative for falls.  Skin:  Negative for rash.  Neurological:  Negative for dizziness, loss of consciousness and headaches.  Endo/Heme/Allergies:  Negative for environmental allergies.  Psychiatric/Behavioral:  Negative for depression. The patient is not nervous/anxious.       Objective:     BP 110/70 (BP Location: Left Arm, Patient Position: Sitting, Cuff Size: Large)   Pulse 89   Temp 98.2 F (36.8 C) (Oral)   Resp 18   Ht 5' 2 (1.575 m)   Wt 295 lb 6.4 oz (134 kg)   SpO2 96%   BMI 54.03 kg/m  BP Readings from Last 3 Encounters:  12/27/23 110/70  08/22/23 116/72  08/02/23 120/70   Wt Readings from Last 3 Encounters:  12/27/23 295 lb 6.4 oz (134 kg)  08/22/23 293 lb (132.9 kg)   08/02/23 291 lb 9.6 oz (132.3 kg)   SpO2 Readings from Last 3 Encounters:  12/27/23 96%  08/02/23 97%  09/04/22 96%      Physical Exam Vitals and nursing note reviewed.  Constitutional:      General: She is not in acute distress.    Appearance: Normal appearance. She is well-developed.  HENT:     Head: Normocephalic and atraumatic.  Eyes:     General: No scleral icterus.       Right eye: No discharge.        Left eye: No discharge.  Cardiovascular:     Rate and Rhythm: Normal rate and regular rhythm.     Heart sounds: No murmur heard. Pulmonary:     Effort: Pulmonary effort is normal. No respiratory distress.     Breath sounds: Normal breath sounds.  Abdominal:     General: Abdomen is flat. There is no distension.     Palpations: Abdomen is soft. There is no mass.     Tenderness: There is no abdominal tenderness. There is no guarding or rebound.     Hernia: No hernia is present.  Musculoskeletal:        General: Normal range of motion.     Cervical back: Normal range of motion and neck supple.     Right lower leg: No edema.     Left lower leg: No edema.  Skin:    General: Skin is warm and dry.  Neurological:     General: No focal deficit present.     Mental Status: She is alert and oriented to person, place, and time.  Psychiatric:        Mood and Affect: Mood normal.        Behavior: Behavior normal.  Thought Content: Thought content normal.        Judgment: Judgment normal.      No results found for any visits on 12/27/23.  Last CBC Lab Results  Component Value Date   WBC 8.7 02/07/2023   HGB 14.2 02/07/2023   HCT 43.0 02/07/2023   MCV 94.2 02/07/2023   MCH 31.0 05/17/2021   RDW 12.9 02/07/2023   PLT 274.0 02/07/2023   Last metabolic panel Lab Results  Component Value Date   GLUCOSE 83 02/07/2023   NA 138 02/07/2023   K 4.2 02/07/2023   CL 107 02/07/2023   CO2 23 02/07/2023   BUN 11 02/07/2023   CREATININE 0.62 02/07/2023   GFR 111.55  02/07/2023   CALCIUM  8.8 02/07/2023   PROT 6.4 02/07/2023   ALBUMIN 3.7 02/07/2023   LABGLOB 2.2 12/12/2018   AGRATIO 2.0 12/12/2018   BILITOT 0.5 02/07/2023   ALKPHOS 62 02/07/2023   AST 14 02/07/2023   ALT 13 02/07/2023   ANIONGAP 7 05/17/2021   Last lipids Lab Results  Component Value Date   CHOL 159 02/07/2023   HDL 45.90 02/07/2023   LDLCALC 95 02/07/2023   TRIG 90.0 02/07/2023   CHOLHDL 3 02/07/2023   Last hemoglobin A1c Lab Results  Component Value Date   HGBA1C 5.8 02/07/2023   Last thyroid  functions Lab Results  Component Value Date   TSH 1.74 05/08/2022   T3TOTAL 97 12/12/2018   T4TOTAL 6.2 05/08/2022   THYROIDAB 137 (H) 05/08/2022   Last vitamin D  Lab Results  Component Value Date   VD25OH 38.53 05/08/2022   Last vitamin B12 and Folate Lab Results  Component Value Date   VITAMINB12 302 05/08/2022   FOLATE 19.4 09/10/2017      The 10-year ASCVD risk score (Arnett DK, et al., 2019) is: 1.1%    Assessment & Plan:   Problem List Items Addressed This Visit       High   Morbid obesity (HCC)   Relevant Orders   Amb Ref to Medical Weight Management     Unprioritized   Vitamin D  deficiency   Relevant Orders   VITAMIN D  25 Hydroxy (Vit-D Deficiency, Fractures)   HTN (hypertension)   Relevant Medications   metoprolol  succinate (TOPROL -XL) 100 MG 24 hr tablet   rosuvastatin  (CRESTOR ) 10 MG tablet   Other Visit Diagnoses       Hyperlipidemia, unspecified hyperlipidemia type    -  Primary   Relevant Medications   metoprolol  succinate (TOPROL -XL) 100 MG 24 hr tablet   rosuvastatin  (CRESTOR ) 10 MG tablet   Other Relevant Orders   CBC with Differential/Platelet   Comprehensive metabolic panel with GFR   Lipid panel     Palpitations       Relevant Medications   metoprolol  succinate (TOPROL -XL) 100 MG 24 hr tablet     Controlled type 2 diabetes mellitus with complication, without long-term current use of insulin  (HCC)       Relevant  Medications   rosuvastatin  (CRESTOR ) 10 MG tablet   Other Relevant Orders   Comprehensive metabolic panel with GFR   Hemoglobin A1c   Microalbumin / creatinine urine ratio     Thyroid  antibody positive       Relevant Orders   TSH   T4, free     Other fatigue       Relevant Orders   TSH   Iron, TIBC and Ferritin Panel   T4, free     Assessment and Plan Assessment &  Plan Weight Management   Her A1c was elevated before gastric sleeve surgery but has normalized post-surgery. She was previously on Wegovy , but insurance coverage was discontinued. She is interested in [place], but insurance requires a diabetes diagnosis or participation in a specific weight loss program. No A1c test has been done in three years, which may affect insurance coverage for weight management medications. A recent change to [employer] may impact medication and program coverage. Order an A1c test and explore insurance coverage for Mounjaro and Ozempic  based on A1c results and potential weight loss program participation.  Post-Gastric Sleeve Surgery Monitoring   She underwent gastric sleeve surgery in 2022 and requires monitoring of vitamin levels as per the surgeon's request. She reports the surgery did not restrict eating as expected and desires to explore medication options for weight management. Order lab tests to check vitamin levels.  Medication Refill   She requires refills for metoprolol  and rosuvastatin  for hypertension and hyperlipidemia management. Refill metoprolol  and rosuvastatin  prescriptions.  Abdominal Bulge   She reports a bulge on the right side of the abdomen without pain or other symptoms. Physical examination did not reveal any nodules or masses. The bulge may be due to asymmetry, and there is no immediate concern.    No follow-ups on file.    Eshan Trupiano R Lowne Chase, DO

## 2023-12-28 LAB — IRON,TIBC AND FERRITIN PANEL
%SAT: 29 % (ref 16–45)
Ferritin: 24 ng/mL (ref 16–232)
Iron: 106 ug/dL (ref 40–190)
TIBC: 370 ug/dL (ref 250–450)

## 2023-12-30 ENCOUNTER — Other Ambulatory Visit: Payer: Self-pay | Admitting: Family Medicine

## 2023-12-30 ENCOUNTER — Ambulatory Visit: Payer: Self-pay | Admitting: Family Medicine

## 2023-12-30 DIAGNOSIS — I10 Essential (primary) hypertension: Secondary | ICD-10-CM

## 2023-12-30 DIAGNOSIS — E785 Hyperlipidemia, unspecified: Secondary | ICD-10-CM

## 2023-12-30 DIAGNOSIS — E118 Type 2 diabetes mellitus with unspecified complications: Secondary | ICD-10-CM

## 2023-12-31 ENCOUNTER — Telehealth: Payer: Self-pay

## 2023-12-31 MED ORDER — OZEMPIC (0.25 OR 0.5 MG/DOSE) 2 MG/3ML ~~LOC~~ SOPN
0.2500 mg | PEN_INJECTOR | SUBCUTANEOUS | 0 refills | Status: DC
Start: 2023-12-31 — End: 2024-01-24

## 2023-12-31 MED ORDER — VITAMIN D (ERGOCALCIFEROL) 1.25 MG (50000 UNIT) PO CAPS: 50000.0000 [IU] | ORAL_CAPSULE | ORAL | 1 refills | Status: DC

## 2023-12-31 NOTE — Telephone Encounter (Signed)
 Pharmacy Patient Advocate Encounter   Received notification from CoverMyMeds that prior authorization for Ozempic  (0.25 or 0.5 MG/DOSE) 2MG /3ML pen-injectors is required/requested.   Insurance verification completed.   The patient is insured through [facility] .   Per test claim: PA required; PA submitted to above mentioned insurance via CoverMyMeds Key/confirmation #/EOC [id number] Status is pending

## 2024-01-01 NOTE — Telephone Encounter (Signed)
 Pharmacy Patient Advocate Encounter  Received notification from [facility] that Prior Authorization for Ozempic  (0.25 or 0.5 MG/DOSE) 2MG /3ML pen-injectors  has been DENIED.  Full denial letter will be uploaded to the media tab. See denial reason below.   PA #/Case ID/Reference #: [id number]

## 2024-01-02 DIAGNOSIS — Z0289 Encounter for other administrative examinations: Secondary | ICD-10-CM

## 2024-01-03 ENCOUNTER — Ambulatory Visit: Admitting: Family Medicine

## 2024-01-03 ENCOUNTER — Encounter: Payer: Self-pay | Admitting: Family Medicine

## 2024-01-03 ENCOUNTER — Telehealth: Payer: Self-pay | Admitting: *Deleted

## 2024-01-03 VITALS — BP 136/87 | HR 67 | Temp 98.0°F | Ht 62.0 in | Wt 296.0 lb

## 2024-01-03 DIAGNOSIS — R0602 Shortness of breath: Secondary | ICD-10-CM | POA: Diagnosis not present

## 2024-01-03 DIAGNOSIS — I10 Essential (primary) hypertension: Secondary | ICD-10-CM | POA: Diagnosis not present

## 2024-01-03 DIAGNOSIS — K912 Postsurgical malabsorption, not elsewhere classified: Secondary | ICD-10-CM

## 2024-01-03 DIAGNOSIS — R5383 Other fatigue: Secondary | ICD-10-CM | POA: Diagnosis not present

## 2024-01-03 DIAGNOSIS — K219 Gastro-esophageal reflux disease without esophagitis: Secondary | ICD-10-CM

## 2024-01-03 DIAGNOSIS — E118 Type 2 diabetes mellitus with unspecified complications: Secondary | ICD-10-CM

## 2024-01-03 DIAGNOSIS — E785 Hyperlipidemia, unspecified: Secondary | ICD-10-CM

## 2024-01-03 DIAGNOSIS — F32A Depression, unspecified: Secondary | ICD-10-CM

## 2024-01-03 DIAGNOSIS — Z9884 Bariatric surgery status: Secondary | ICD-10-CM

## 2024-01-03 DIAGNOSIS — F419 Anxiety disorder, unspecified: Secondary | ICD-10-CM | POA: Diagnosis not present

## 2024-01-03 DIAGNOSIS — E66813 Obesity, class 3: Secondary | ICD-10-CM

## 2024-01-03 DIAGNOSIS — F509 Eating disorder, unspecified: Secondary | ICD-10-CM

## 2024-01-03 NOTE — Progress Notes (Signed)
 At a Glance:  Vitals Temp: 98 F (36.7 C) BP: 136/87 Pulse Rate: 67 SpO2: 94 %   Anthropometric Measurements Height: 5' 2 (1.575 m) Weight: 296 lb (134.3 kg) BMI (Calculated): 54.13 Starting Weight: 296lb   Body Composition  Body Fat %: 57.6 % Fat Mass (lbs): 170.4 lbs Muscle Mass (lbs): 119.2 lbs Visceral Fat Rating : 22   Other Clinical Data RMR: 2419 Fasting: Yes Labs: Yes Today's Visit #: 1 Starting Date: 01/03/24    EKG: Normal sinus rhythm, rate 78.  Indirect Calorimeter completed today shows a VO2 of 351 and a REE of 2419.  Her calculated basal metabolic rate is 8114 thus her basal metabolic rate is better than expected.  Chief Complaint:  Obesity   Subjective:  Jeanne [name] (MR# [id number]) is a 43 y.o. female who presents for evaluation and treatment of obesity and related comorbidities.   Jeanne [name] is currently in the action stage of change and ready to dedicate time achieving and maintaining a healthier weight. Jeanne [name] is interested in becoming our patient and working on intensive lifestyle modifications including (but not limited to) diet and exercise for weight loss.  Jeanne [name] has been struggling with her weight. She has been unsuccessful in either losing weight, maintaining weight loss, or reaching her healthy weight goal.  She is status post vertical sleeve gastrectomy with Dr. Tanda in December 2022.  Her preop weight was 306 pounds her postop nadir weight 265 pounds.  She started to regain weight 6 months postop.  She admits to emotional eating and poor eating habits.  She never started a regular exercise plan.  She works as a Education officer, museum.  She is married with an adoptive 37-month-old baby boy.  She has some volume control from her vertical sleeve gastrectomy but admits to overeating and over snacking with poor food choices.  She has not been consistent with taking a vitamin.  She is craving more sweets, sugar sweetened beverages.  She  denies any food aversions but dislikes the food.  She eats out about 7 times a week.  Jeanne [name]'s habits were reviewed today and are as follows: Her family eats meals together, she has been heavy most of her life, she started gaining weight in childhood but gained more in adulthood. her heaviest weight ever was 330 pounds, she has significant food cravings issues, she snacks frequently in the evenings, she is frequently drinking liquids with calories, she frequently makes poor food choices, she has problems with excessive hunger, she frequently eats larger portions than normal, she has binge eating behaviors, and she struggles with emotional eating.    Other Fatigue Jeanne [name] admits to daytime somnolence and admits to waking up still tired. Patient has a history of symptoms of morning fatigue. Jeanne [name] generally gets 6 or 7 hours of sleep per night, and states that she has generally restful sleep. Snoring is present. Apneic episodes are not present. Epworth Sleepiness Score is 13.   Shortness of Breath Doristine notes increasing shortness of breath with exercising and seems to be worsening over time with weight gain. She notes getting out of breath sooner with activity than she used to. This has gotten worse recently. Jeanne [name] denies shortness of breath at rest or orthopnea.   Depression Screen Jeanne [name]'s Food and Mood (modified PHQ-9) score was 18.     12/27/2023    9:37 AM  Depression screen PHQ 2/9  Decreased Interest 0  Down, Depressed, Hopeless 0  PHQ - 2 Score 0  Assessment and Plan:   Other Fatigue Jeanne [name] does feel that her weight is causing her energy to be lower than it should be. Fatigue may be related to obesity, depression or many other causes. Labs will be ordered, and in the meanwhile, Jeanne [name] will focus on self care including making healthy food choices, increasing physical activity and focusing on stress reduction.  Shortness of Breath Jeanne [name] does feel that she gets out of breath  more easily that she used to when she exercises. Jeanne [name]'s shortness of breath appears to be obesity related and exercise induced. She has agreed to work on weight loss and gradually increase exercise to treat her exercise induced shortness of breath. Will continue to monitor closely.  Jeanne [name] had a positive depression screening. Depression is commonly associated with obesity and often results in emotional eating behaviors. We will monitor this closely and work on CBT to help improve the non-hunger eating patterns. Referral to Psychology may be required if no improvement is seen as she continues in our clinic.    Problem List Items Addressed This Visit     HTN (hypertension) Blood pressure is controlled on Toprol -XL 100 mg once daily Consider alternative antihypertensive medication as beta-blockers can contribute to fatigue and weight gain. Continue current medications for now and begin active plan for weight reduction.    S/P laparoscopic sleeve gastrectomy Reviewed patient's surgical history.  She still has some volume control from her vertical sleeve gastrectomy but is currently consuming an excess amount of added sugar, sugar sweetened beverages and refined carbohydrates, lacking adequate protein and fiber with meals.  She has recently reintroduced a Flintstones multivitamin with iron once daily and started vitamin D  50,000 IU once weekly with her PCP.  Will update additional labs today.  Begin prescribed diet.    Other Visit Diagnoses       SOBOE (shortness of breath on exertion)    -  Primary     Other fatigue       Relevant Orders   EKG 12-Lead (Completed)   Insulin , random     Postoperative intestinal malabsorption       Relevant Orders   Folate   Vitamin B12   Prealbumin   Folate   Vitamin B1     Class 3 severe obesity due to excess calories with body mass index (BMI) of 50.0 to 59.9 in adult   See above     Controlled type 2 diabetes mellitus with complication, without  long-term current use of insulin  (HCC)     Repeat A1c was 6.9.  She has been prescribed Ozempic  but not yet started her prescription due to insurance issues.  She has had previous success on Wegovy  and Ozempic  in the past without adverse side effect.  She has had GI intolerance to metformin .  She is not taking her blood sugars.  She may benefit from addition of an ACE inhibitor or ARB for renal protection with type 2 diabetes.  Begin prescribed diet which is low in added sugar and refined carbohydrates while working on the protein and fiber with meals.  Begin active plan for weight reduction.      Anxiety and depression   Mood disorder and emotional eating patterns contributing to weight gain with a history of disordered eating.  She is currently in counseling.  She reports good compliance taking sertraline  100 mg tab, 2 tabs daily and bupropion  SR 200 mg daily.  Continue current medications and counseling.  Work on stress reduction, good sleep at night,  adequate nutrition and increasing exercise frequency.      Gastroesophageal reflux disease without esophagitis   She is off antireflux medication.  Will monitor for worsening GERD with weight gain post vertical sleeve gastrectomy.  She has high risk for peptic ulcer disease s/p  sleeve gastrectomy but denies use of NSAIDs.     Hyperlipidemia, unspecified hyperlipidemia type     She is back on rosuvastatin  10 mg once daily at bedtime, tolerating well.  Look for improvements over time.  Begin prescribed dietary plan which is low in saturated fat      Eating disorder, unspecified type   Has a long history of disordered eating with binging tendencies, emotional eating.  She is in counseling.  Will begin working on eating on a schedule focusing on food as fuel, reducing refined carbohydrates and added sugar which contributes to hyperphagia.        Kada is currently in the action stage of change and her goal is to get back to weightloss efforts . I  recommend Aliannah begin the structured treatment plan as follows:  She has agreed to keeping a food journal and adhering to recommended goals of 1800 calories and 120 g of protein Reviewed dietary change goals (after visit summary.  Recommend creating a 1800-calorie low-carb meal plan using AI.  Exercise goals: All adults should avoid inactivity. Some activity is better than none, and adults who participate in any amount of physical activity, gain some health benefits.  Behavioral modification strategies:increasing lean protein intake, decreasing simple carbohydrates, increase H2O intake, decrease liquid calories, increase high fiber foods, decreasing eating out, no skipping meals, meal planning and cooking strategies, keeping healthy foods in the home, better snacking choices, emotional eating strategies , planning for success, and decrease junk food  200 calorie snack list given  She was informed of the importance of frequent follow-up visits to maximize her success with intensive lifestyle modifications for her multiple health conditions. She was informed we would discuss her lab results at her next visit unless there is a critical issue that needs to be addressed sooner. Meha agreed to keep her next visit at the agreed upon time to discuss these results.  Objective:  General: Cooperative, alert, well developed, in no acute distress. HEENT: Conjunctivae and lids unremarkable. Cardiovascular: Regular rhythm.  Lungs: Normal work of breathing. Neurologic: No focal deficits.   Lab Results  Component Value Date   CREATININE 0.59 12/27/2023   BUN 11 12/27/2023   NA 138 12/27/2023   K 4.0 12/27/2023   CL 104 12/27/2023   CO2 24 12/27/2023   Lab Results  Component Value Date   ALT 16 12/27/2023   AST 15 12/27/2023   ALKPHOS 84 12/27/2023   BILITOT 0.5 12/27/2023   Lab Results  Component Value Date   HGBA1C 6.4 12/27/2023   HGBA1C 5.8 02/07/2023   HGBA1C 6.2 05/09/2021   HGBA1C  6.9 (H) 01/11/2021   HGBA1C 6.4 (H) 05/14/2020   Lab Results  Component Value Date   INSULIN  9.9 12/12/2018   INSULIN  19.6 04/25/2018   INSULIN  19.2 01/01/2018   INSULIN  12.3 09/10/2017   Lab Results  Component Value Date   TSH 1.77 12/27/2023   Lab Results  Component Value Date   CHOL 207 (H) 12/27/2023   HDL 51.30 12/27/2023   LDLCALC 122 (H) 12/27/2023   TRIG 171.0 (H) 12/27/2023   CHOLHDL 4 12/27/2023   Lab Results  Component Value Date   WBC 7.5 12/27/2023   HGB  14.5 12/27/2023   HCT 42.9 12/27/2023   MCV 90.5 12/27/2023   PLT 293.0 12/27/2023   Lab Results  Component Value Date   IRON 106 12/27/2023   TIBC 370 12/27/2023   FERRITIN 24 12/27/2023    Attestation Statements:  Reviewed by clinician on day of visit: allergies, medications, problem list, medical history, surgical history, family history, social history, and previous encounter notes.  Time spent on visit including pre-visit chart review and post-visit charting and face- to face care including nutritional counseling, review of EKG, interpretation of body composition scale and indirect calorimetry and nutrition prescription  was 45 minutes.   Darice Haddock, D.O. DABFM, DABOM [facility] [address] [place], [place] [address] [phone]

## 2024-01-03 NOTE — Patient Instructions (Addendum)
 Use Chat GPT to create an 1800 calorie high protein meal plan You can swap out meals but keep the calories the same  Don't skip meals Remember lean protein and fiber with meals (fruits and veggies)  Let's re- run a PA for Ozempic   Increase Flintstone vitamin with iron to 2 pills a day Keep vitamin D  RX weekly

## 2024-01-03 NOTE — Telephone Encounter (Signed)
 Prior authorization approved for patients Ozempic . Patient notified.  Message from Plan Your PA request has been approved. Additional information will be provided in the approval communication. (Message 1145). Authorization Expiration Date: January 03, 2027.

## 2024-01-03 NOTE — Telephone Encounter (Signed)
Prior authorization done via cover my meds for patients Ozempic. Waiting on determination.  

## 2024-01-06 LAB — INSULIN, RANDOM: INSULIN: 7.9 u[IU]/mL (ref 2.6–24.9)

## 2024-01-06 LAB — VITAMIN B12: Vitamin B-12: 263 pg/mL (ref 232–1245)

## 2024-01-06 LAB — PREALBUMIN: PREALBUMIN: 22 mg/dL (ref 12–34)

## 2024-01-06 LAB — FOLATE: Folate: 9.1 ng/mL (ref >3.0)

## 2024-01-06 LAB — VITAMIN B1: Thiamine: 125.9 nmol/L (ref 66.5–200.0)

## 2024-01-09 ENCOUNTER — Ambulatory Visit: Payer: Self-pay | Admitting: Family Medicine

## 2024-01-20 ENCOUNTER — Encounter: Payer: Self-pay | Admitting: Family Medicine

## 2024-01-21 ENCOUNTER — Other Ambulatory Visit: Payer: Self-pay | Admitting: Family Medicine

## 2024-01-21 DIAGNOSIS — J4520 Mild intermittent asthma, uncomplicated: Secondary | ICD-10-CM

## 2024-01-21 MED ORDER — TRELEGY ELLIPTA 100-62.5-25 MCG/ACT IN AEPB
INHALATION_SPRAY | RESPIRATORY_TRACT | 2 refills | Status: DC
Start: 2024-01-21 — End: 2024-04-28

## 2024-01-21 MED ORDER — ALBUTEROL SULFATE HFA 108 (90 BASE) MCG/ACT IN AERS: 2.0000 | INHALATION_SPRAY | Freq: Four times a day (QID) | RESPIRATORY_TRACT | 3 refills | Status: AC | PRN

## 2024-01-24 ENCOUNTER — Ambulatory Visit (INDEPENDENT_AMBULATORY_CARE_PROVIDER_SITE_OTHER): Admitting: Family Medicine

## 2024-01-24 ENCOUNTER — Encounter: Payer: Self-pay | Admitting: Family Medicine

## 2024-01-24 VITALS — BP 124/87 | HR 82 | Ht 62.0 in | Wt 290.0 lb

## 2024-01-24 DIAGNOSIS — E66813 Obesity, class 3: Secondary | ICD-10-CM

## 2024-01-24 DIAGNOSIS — F509 Eating disorder, unspecified: Secondary | ICD-10-CM | POA: Diagnosis not present

## 2024-01-24 DIAGNOSIS — E538 Deficiency of other specified B group vitamins: Secondary | ICD-10-CM

## 2024-01-24 DIAGNOSIS — Z6841 Body Mass Index (BMI) 40.0 and over, adult: Secondary | ICD-10-CM

## 2024-01-24 DIAGNOSIS — E118 Type 2 diabetes mellitus with unspecified complications: Secondary | ICD-10-CM | POA: Diagnosis not present

## 2024-01-24 DIAGNOSIS — Z9884 Bariatric surgery status: Secondary | ICD-10-CM

## 2024-01-24 DIAGNOSIS — Z7985 Long-term (current) use of injectable non-insulin antidiabetic drugs: Secondary | ICD-10-CM

## 2024-01-24 MED ORDER — VITAMIN B-12 500 MCG SL SUBL: 500.0000 ug | SUBLINGUAL_TABLET | Freq: Every day | SUBLINGUAL | 1 refills | Status: AC

## 2024-01-24 MED ORDER — OZEMPIC (0.25 OR 0.5 MG/DOSE) 2 MG/3ML ~~LOC~~ SOPN: 0.5000 mg | PEN_INJECTOR | SUBCUTANEOUS | 0 refills | Status: DC

## 2024-01-24 NOTE — Progress Notes (Signed)
 Office: [phone]  /  Fax: [phone]  WEIGHT SUMMARY AND BIOMETRICS  Starting Date: 01/03/24  Starting Weight: 296lb   Weight Lost Since Last Visit: 6lb   Vitals BP: 124/87 Pulse Rate: 82 SpO2: 94 %   Body Composition  Body Fat %: 56.9 % Fat Mass (lbs): 165.4 lbs Muscle Mass (lbs): 119 lbs Visceral Fat Rating : 21     HPI  Chief Complaint: OBESITY  Jeanne [name] is here to discuss her progress with her obesity treatment plan. She is on the keeping a food journal and adhering to recommended goals of 1800 calories and 120 protein and states she is following her eating plan approximately 50 % of the time. She states she is exercising 0 minutes 0 times per week.   Interval History:  Since last office visit she is down 6 lb She is down 0.2 lb of muscle mass and down 5 lb of body fat She has a good support system She did start on Ozempic  0.25 mg x 3 weeks Denies nausea, constipation or heartburn Volume control at mealtime and food noise have improved She is back to working -- Runner, broadcasting/film/video Stress levels have been high She plans to increase walking time in the afternoon  Pharmacotherapy: Ozempic  0.25 mg weekly  PHYSICAL EXAM:  Blood pressure 124/87, pulse 82, height 5' 2 (1.575 m), weight 290 lb (131.5 kg), SpO2 94%. Body mass index is 53.04 kg/m.  General: She is overweight, cooperative, alert, well developed, and in no acute distress. PSYCH: Has normal mood, affect and thought process.   Lungs: Normal breathing effort, no conversational dyspnea.   ASSESSMENT AND PLAN  TREATMENT PLAN FOR OBESITY:  Recommended Dietary Goals  [name] is currently in the action stage of change. As such, her goal is to continue weight management plan. She has agreed to keeping a food journal and adhering to recommended goals of 1600 calories and 90+ g of  protein and following a lower carbohydrate, vegetable and lean protein rich diet plan. OK to keep 2 fresh or frozen fruit  servings daily Optional to add 1/4 plate starch w/ dinner  Behavioral Intervention  We discussed the following Behavioral Modification Strategies today: increasing lean protein intake to established goals, increasing vegetables, increasing water  intake , work on meal planning and preparation, work on Counselling psychologist calories using tracking application, keeping healthy foods at home, work on managing stress, creating time for self-care and relaxation, continue to practice mindfulness when eating, planning for success, and continue to work on maintaining a reduced calorie state, getting the recommended amount of protein, incorporating whole foods, making healthy choices, staying well hydrated and practicing mindfulness when eating..  Additional resources provided today: NA  Recommended Physical Activity Goals  Zamarah has been advised to work up to 150 minutes of moderate intensity aerobic activity a week and strengthening exercises 2-3 times per week for cardiovascular health, weight loss maintenance and preservation of muscle mass.   She has agreed to Start aerobic activity with a goal of 150 minutes a week at moderate intensity.   Pharmacotherapy changes for the treatment of obesity: increase Ozempic  to 0.5 mg weekly  ASSOCIATED CONDITIONS ADDRESSED TODAY  Controlled type 2 diabetes mellitus with complication, without long-term current use of insulin  (HCC) Lab Results  Component Value Date   HGBA1C 6.4 12/27/2023  She is doing well with new start Ozempic  w/o GI SE, meal skipping or hypoglycemia Previous GI intolerance to metformin  Finish dose 4 of Ozempic  0.25 mg weekly then increase  to 0.5 mg weekly Continue to work on a low sugar/ lower carbohydrate diet Add in afternoon walks with a goal of 30 min 3+ x a week -     Ozempic  (0.25 or 0.5 MG/DOSE); Inject 0.5 mg into the skin once a week.  Dispense: 3 mL; Refill: 0  Low serum vitamin B12 Lab Results  Component Value Date    VITAMINB12 263 01/03/2024   Reviewed labs with patient At risk for low B12 with sleeve gastrectomy Denies intake of a vegan diet, has some fatigue but denies brain fog or paresthesias Taking a Flintstone MVI daily-- increased to 2 per day Will add : -     Vitamin B-12; Place 1 tablet (500 mcg total) under the tongue daily.  Dispense: 90 tablet; Refill: 1  Class 3 severe obesity due to excess calories with body mass index (BMI) of 50.0 to 59.9 in adult Improving Reviewed BIA results  S/P laparoscopic sleeve gastrectomy Continue smaller meals, with lean protein and fiber Reviewed labs Continue 2 Flinstone chewables daily and RX vitamin D  weekly (by PCP) Stay off high sugar foods and drinks  Eating disorder, unspecified type Improving Food noise and volume control improved with the addition of Ozempic  Avoid meal skipping Avoid purchase of ultra processed foods that leads to over consumption    She was informed of the importance of frequent follow up visits to maximize her success with intensive lifestyle modifications for her multiple health conditions.   ATTESTASTION STATEMENTS:  Reviewed by clinician on day of visit: allergies, medications, problem list, medical history, surgical history, family history, social history, and previous encounter notes pertinent to obesity diagnosis.   I have personally spent 30 minutes total time today in preparation, patient care, nutritional counseling and education,  and documentation for this visit, including the following: review of most recent clinical lab tests, prescribing medications/ refilling medications, reviewing medical assistant documentation, review and interpretation of bioimpedence results.     Darice Haddock, D.O. DABFM, DABOM [facility] [address] [place], [place] [address] [phone]

## 2024-01-24 NOTE — Patient Instructions (Addendum)
 Finish out Ozempic  0.25 mg for one more week then go up to 0.5 mg weekly  Call if any problems or questions  Stay on 2 Flintstone vitamins daily, Calcium  Citrate daily, stay on RX vitamin D  Weekly.  Add RX vitamin B12 500 mcg daily  Continue current meal plan, allowing 2 servings of fresh fruit daily and the option for 1/4 plate starch with dinner

## 2024-02-21 ENCOUNTER — Ambulatory Visit: Admitting: Family Medicine

## 2024-02-21 ENCOUNTER — Encounter: Payer: Self-pay | Admitting: Family Medicine

## 2024-02-21 VITALS — BP 127/86 | HR 81 | Temp 98.6°F | Ht 62.0 in | Wt 290.0 lb

## 2024-02-21 DIAGNOSIS — E66813 Obesity, class 3: Secondary | ICD-10-CM

## 2024-02-21 DIAGNOSIS — E538 Deficiency of other specified B group vitamins: Secondary | ICD-10-CM

## 2024-02-21 DIAGNOSIS — Z7985 Long-term (current) use of injectable non-insulin antidiabetic drugs: Secondary | ICD-10-CM

## 2024-02-21 DIAGNOSIS — Z6841 Body Mass Index (BMI) 40.0 and over, adult: Secondary | ICD-10-CM | POA: Diagnosis not present

## 2024-02-21 DIAGNOSIS — E118 Type 2 diabetes mellitus with unspecified complications: Secondary | ICD-10-CM | POA: Diagnosis not present

## 2024-02-21 DIAGNOSIS — Z9884 Bariatric surgery status: Secondary | ICD-10-CM

## 2024-02-21 MED ORDER — OZEMPIC (0.25 OR 0.5 MG/DOSE) 2 MG/3ML ~~LOC~~ SOPN: 0.2500 mg | PEN_INJECTOR | SUBCUTANEOUS | 0 refills | Status: DC

## 2024-02-21 NOTE — Patient Instructions (Addendum)
 Check out Jeanne [name]'s meals for a quick and easy lunch or dinner and check out [employer] product line Bring food to work Remember your fruits and veggies Aim for 1600 kal/ day This should include 100 g of protein daily  Continue vitamins Hydrate well with water  between meals  Avoid high sugar foods and drinks  Reduce Ozempic  0.25 mg weekly

## 2024-02-21 NOTE — Progress Notes (Signed)
 Office: [phone]  /  Fax: [phone]  WEIGHT SUMMARY AND BIOMETRICS  Starting Date: 01/03/24  Starting Weight: 296lb   Weight Lost Since Last Visit: 0lb   Vitals Temp: 98.6 F (37 C) BP: 127/86 Pulse Rate: 81 SpO2: 96 %   Body Composition  Body Fat %: 58.1 % Fat Mass (lbs): 168.4 lbs Muscle Mass (lbs): 115 lbs Visceral Fat Rating : 21    HPI  Chief Complaint: OBESITY  Jeanne [name] is here to discuss her progress with her obesity treatment plan. She is on the keeping a food journal and adhering to recommended goals of 1600 calories and 90 protein and states she is following her eating plan approximately 0 % of the time. She states she is exercising 0 minutes 0 times per week.  Interval History:  Since last office visit she is down 0 lb This gives her net weight loss of 6 pounds in the past 7 weeks of medically supervised weight management She did go up on Ozempic  to 0.5 mg weekly She has had more diarrhea no matter what she eats She has occasional nausea but no heartburn She has appetite control which improved with new start Ozempic  She has had a lot of stress at work She is seeing a therapist, managing stressors She has a good support system at home She is struggling more with dinner planning  Pharmacotherapy: Ozempic  0.5 mg once weekly injection  PHYSICAL EXAM:  Blood pressure 127/86, pulse 81, temperature 98.6 F (37 C), height 5' 2 (1.575 m), weight 290 lb (131.5 kg), SpO2 96%. Body mass index is 53.04 kg/m.  General: She is overweight, cooperative, alert, well developed, and in no acute distress. PSYCH: Has normal mood, affect and thought process.   Lungs: Normal breathing effort, no conversational dyspnea.   ASSESSMENT AND PLAN  TREATMENT PLAN FOR OBESITY:  Recommended Dietary Goals  Jeanne [name] is currently in the action stage of change. As such, her goal is to continue weight management plan. She has agreed to keeping a food journal and adhering  to recommended goals of 1600 calories and 100 g of protein protein and following a lower carbohydrate, vegetable and lean protein rich diet plan.  Behavioral Intervention  We discussed the following Behavioral Modification Strategies today: increasing lean protein intake to established goals, increasing vegetables, increasing fiber rich foods, increasing water  intake , work on meal planning and preparation, work on tracking and journaling calories using tracking application, reading food labels , keeping healthy foods at home, work on managing stress, creating time for self-care and relaxation, and avoiding temptations and identifying enticing environmental cues.  Additional resources provided today: NA  Recommended Physical Activity Goals  Jeanne [name] has been advised to work up to 150 minutes of moderate intensity aerobic activity a week and strengthening exercises 2-3 times per week for cardiovascular health, weight loss maintenance and preservation of muscle mass.   She has agreed to Think about enjoyable ways to increase daily physical activity and overcoming barriers to exercise  Pharmacotherapy changes for the treatment of obesity: Reduce Ozempic  to 0.25 mg once weekly injection  ASSOCIATED CONDITIONS ADDRESSED TODAY  Controlled type 2 diabetes mellitus with complication, without long-term current use of insulin  (HCC) Lab Results  Component Value Date   HGBA1C 6.4 12/27/2023  She has good control of her type 2 diabetes Due to some slight GI upset on Ozempic  0.5 mg once weekly injection but no benefit of additional weight loss, will reduce her dose back down to 0.25 mg once  weekly injection.  Continue to work on healthy lifestyle changes as outlined together on after visit summary. -     Ozempic  (0.25 or 0.5 MG/DOSE); Inject 0.25 mg into the skin once a week.  Dispense: 3 mL; Refill: 0  Class 3 severe obesity due to excess calories with body mass index (BMI) of 50.0 to 59.9 in adult  Low  serum vitamin B12 Lab Results  Component Value Date   VITAMINB12 263 01/03/2024  She has added in vitamin B12 500 mcg once daily.  Energy level is slightly improved.  Continue along with a multivitamin once daily.  Recheck level in 3 months  S/P laparoscopic sleeve gastrectomy She is doing well with weight reduction status post vertical sleeve gastrectomy, down from a preop weight of 306 pounds with a nadir weight of 265 pounds.  She has improved food volumes at mealtime with the help of Ozempic  once weekly injection.  She is working on improving her lean protein intake and reducing added sugar and refined carbohydrates.    She was informed of the importance of frequent follow up visits to maximize her success with intensive lifestyle modifications for her multiple health conditions.   ATTESTASTION STATEMENTS:  Reviewed by clinician on day of visit: allergies, medications, problem list, medical history, surgical history, family history, social history, and previous encounter notes pertinent to obesity diagnosis.   I have personally spent 30 minutes total time today in preparation, patient care, nutritional counseling and education,  and documentation for this visit, including the following: review of most recent clinical lab tests, prescribing medications/ refilling medications, reviewing medical assistant documentation, review and interpretation of bioimpedence results.     Darice Haddock, D.O. DABFM, DABOM [facility] [address] [place], [place] [address] [phone]

## 2024-03-26 ENCOUNTER — Encounter: Payer: Self-pay | Admitting: Family Medicine

## 2024-03-26 ENCOUNTER — Ambulatory Visit: Admitting: Family Medicine

## 2024-03-26 VITALS — BP 128/84 | HR 92 | Temp 98.3°F | Ht 62.0 in | Wt 284.0 lb

## 2024-03-26 DIAGNOSIS — Z6841 Body Mass Index (BMI) 40.0 and over, adult: Secondary | ICD-10-CM

## 2024-03-26 DIAGNOSIS — Z9884 Bariatric surgery status: Secondary | ICD-10-CM

## 2024-03-26 DIAGNOSIS — E118 Type 2 diabetes mellitus with unspecified complications: Secondary | ICD-10-CM

## 2024-03-26 DIAGNOSIS — Z7985 Long-term (current) use of injectable non-insulin antidiabetic drugs: Secondary | ICD-10-CM

## 2024-03-26 DIAGNOSIS — E538 Deficiency of other specified B group vitamins: Secondary | ICD-10-CM

## 2024-03-26 DIAGNOSIS — E66813 Obesity, class 3: Secondary | ICD-10-CM | POA: Diagnosis not present

## 2024-03-26 MED ORDER — OZEMPIC (0.25 OR 0.5 MG/DOSE) 2 MG/3ML ~~LOC~~ SOPN: 0.2500 mg | PEN_INJECTOR | SUBCUTANEOUS | 1 refills | Status: DC

## 2024-03-26 NOTE — Progress Notes (Signed)
 Office: [phone]  /  Fax: [phone]  WEIGHT SUMMARY AND BIOMETRICS  Starting Date: 01/03/24  Starting Weight: 296lb   Weight Lost Since Last Visit: 6lb   Vitals Temp: 98.3 F (36.8 C) BP: 128/84 Pulse Rate: 92 SpO2: 94 %   Body Composition  Body Fat %: 55.8 % Fat Mass (lbs): 158.8 lbs Muscle Mass (lbs): 119.4 lbs Visceral Fat Rating : 20   HPI  Chief Complaint: OBESITY  Jeanne [name] is here to discuss her progress with her obesity treatment plan. She is on the keeping a food journal and adhering to recommended goals of 1600 calories and 120 protein and states she is following her eating plan approximately 30 % of the time. She states she is exercising 0 minutes 0 times per week.  Interval History:  Since last office visit she is down 6 lb She did reduce Ozempic  dose from 0.5 mg to 0.25 mg weekly Diarrhea has improved  She is working on meal prep and meal planning She is getting in fruits and veggies daily She is seeing a therapist She is net down 12 lb in the past 2.5 mos She has not yet added in exercise but is thinking about  She is doing well with post op regain s/p VSG   Pharmacotherapy: Ozempic  0.25 mg weekly  PHYSICAL EXAM:  Blood pressure 128/84, pulse 92, temperature 98.3 F (36.8 C), height 5' 2 (1.575 m), weight 284 lb (128.8 kg), SpO2 94%. Body mass index is 51.94 kg/m.  General: She is overweight, cooperative, alert, well developed, and in no acute distress. PSYCH: Has normal mood, affect and thought process.   Lungs: Normal breathing effort, no conversational dyspnea.  ASSESSMENT AND PLAN  TREATMENT PLAN FOR OBESITY:  Recommended Dietary Goals  Jeanne [name] is currently in the action stage of change. As such, her goal is to continue weight management plan. She has agreed to following a lower carbohydrate, vegetable and lean protein rich diet plan.  Behavioral Intervention  We discussed the following Behavioral Modification Strategies  today: increasing lean protein intake to established goals, increasing fiber rich foods, increasing water  intake , work on meal planning and preparation, keeping healthy foods at home, work on managing stress, creating time for self-care and relaxation, avoiding temptations and identifying enticing environmental cues, continue to practice mindfulness when eating, and planning for success.  Additional resources provided today: NA  Recommended Physical Activity Goals  Jeanne [name] has been advised to work up to 150 minutes of moderate intensity aerobic activity a week and strengthening exercises 2-3 times per week for cardiovascular health, weight loss maintenance and preservation of muscle mass.   She has agreed to Think about enjoyable ways to increase daily physical activity and overcoming barriers to exercise and Increase physical activity in their day and reduce sedentary time (increase NEAT). She plans to add in yoga in the evenings after baby goes to bed  Pharmacotherapy changes for the treatment of obesity: none  ASSOCIATED CONDITIONS ADDRESSED TODAY  Controlled type 2 diabetes mellitus with complication, without long-term current use of insulin  (HCC) Lab Results  Component Value Date   HGBA1C 6.4 12/27/2023  Doing better with reduced dose of Ozempic  with less GI upset on Ozempic  0.25 mg weekly Continue to work on reducing sugar and starch intak Continue to track steps with a goal oif > 8,000 per day  -     Ozempic  (0.25 or 0.5 MG/DOSE); Inject 0.25 mg into the skin once a week.  Dispense: 3 mL; Refill: 1  Class 3 severe obesity due to excess calories with serious comorbidity and body mass index (BMI) of 50.0 to 59.9 in adult [facility]) Improving Reviewed bioimpedence results  S/P laparoscopic sleeve gastrectomy Improving  Up 19 lb from her previous nadir weight Continue on a MVI daily, lean protein with meals, listening to full cues and drinking water  outside of meals  Low serum vitamin  B12 Lab Results  Component Value Date   VITAMINB12 263 01/03/2024   Taking OTC vitamin B12 500 mcg daily Repeat lab in Jan     She was informed of the importance of frequent follow up visits to maximize her success with intensive lifestyle modifications for her multiple health conditions.   ATTESTASTION STATEMENTS:  Reviewed by clinician on day of visit: allergies, medications, problem list, medical history, surgical history, family history, social history, and previous encounter notes pertinent to obesity diagnosis.      Darice Haddock, D.O. DABFM, DABOM [facility] [address] [place], [place] [address] [phone]

## 2024-03-27 ENCOUNTER — Ambulatory Visit: Admitting: Family Medicine

## 2024-04-27 ENCOUNTER — Other Ambulatory Visit: Payer: Self-pay | Admitting: Family Medicine

## 2024-04-27 DIAGNOSIS — J4520 Mild intermittent asthma, uncomplicated: Secondary | ICD-10-CM

## 2024-04-28 ENCOUNTER — Encounter: Payer: Self-pay | Admitting: Family Medicine

## 2024-04-28 ENCOUNTER — Ambulatory Visit: Admitting: Family Medicine

## 2024-04-28 VITALS — BP 124/86 | HR 70 | Temp 98.0°F | Ht 62.0 in | Wt 285.0 lb

## 2024-04-28 DIAGNOSIS — Z9884 Bariatric surgery status: Secondary | ICD-10-CM | POA: Diagnosis not present

## 2024-04-28 DIAGNOSIS — E118 Type 2 diabetes mellitus with unspecified complications: Secondary | ICD-10-CM

## 2024-04-28 DIAGNOSIS — Z6841 Body Mass Index (BMI) 40.0 and over, adult: Secondary | ICD-10-CM | POA: Diagnosis not present

## 2024-04-28 DIAGNOSIS — Z7985 Long-term (current) use of injectable non-insulin antidiabetic drugs: Secondary | ICD-10-CM

## 2024-04-28 MED ORDER — OZEMPIC (0.25 OR 0.5 MG/DOSE) 2 MG/3ML ~~LOC~~ SOPN: PEN_INJECTOR | SUBCUTANEOUS | 1 refills | Status: AC

## 2024-04-28 NOTE — Patient Instructions (Signed)
 Try to stick to an AI created '1600 calorie low carb meal plan'

## 2024-04-28 NOTE — Progress Notes (Signed)
 Office: [phone]  /  Fax: [phone]  WEIGHT SUMMARY AND BIOMETRICS  Starting Date: 01/03/24  Starting Weight: 296lb   Weight Lost Since Last Visit: 0lb   Vitals Temp: 98 F (36.7 C) BP: 124/86 Pulse Rate: 70 SpO2: 96 %   Body Composition  Body Fat %: 56.2 % Fat Mass (lbs): 160.2 lbs Muscle Mass (lbs): 118.6 lbs Visceral Fat Rating : 20    HPI  Chief Complaint: OBESITY  Jeanne [name] is here to discuss her progress with her obesity treatment plan. She is on the keeping a food journal and adhering to recommended goals of 1600 calories and 100 protein and states she is following her eating plan approximately 30 % of the time. She states she is exercising 15-20 minutes 1 times per week.  Interval History:  Since last office visit she is up 1 lb This gives her net weight loss of 11 pounds and 3-1/2 months of medically supervised weight management and treatment of postop regain status post vertical sleeve gastrectomy done December 2022 with a preop weight of 306 pounds and a nadir weight of 265 pounds. This is a 3.7% total body weight loss She is down 0.8 pounds of muscle mass and up 1.4 pounds of body fat since last visit She has been a bit hungrier back down on Ozempic  to 0.25 mg due to diarrhea This has improved over  the past 3 weeks She is trying to be mindful of food choices and portion sizes She has had limited time for exercise but is getting more movement with her toddler Energy level is improving  Husband is supportive  Pharmacotherapy: Ozempic  0.25 mg once weekly injection  PHYSICAL EXAM:  Blood pressure 124/86, pulse 70, temperature 98 F (36.7 C), height 5' 2 (1.575 m), weight 285 lb (129.3 kg), SpO2 96%. Body mass index is 52.13 kg/m.  General: She is overweight, cooperative, alert, well developed, and in no acute distress. PSYCH: Has normal mood, affect and thought process.   Lungs: Normal breathing effort, no conversational  dyspnea.   ASSESSMENT AND PLAN  TREATMENT PLAN FOR OBESITY:  Recommended Dietary Goals  [name] is currently in the action stage of change. As such, her goal is to continue weight management plan. She has agreed to keeping a food journal and adhering to recommended goals of 1600 calories and 100 g of protein and following a lower carbohydrate, vegetable and lean protein rich diet plan.  Behavioral Intervention  We discussed the following Behavioral Modification Strategies today: increasing lean protein intake to established goals, increasing fiber rich foods, increasing water  intake , work on meal planning and preparation, work on counselling psychologist calories using tracking application, keeping healthy foods at home, work on managing stress, creating time for self-care and relaxation, avoiding temptations and identifying enticing environmental cues, continue to practice mindfulness when eating, and planning for success.  Additional resources provided today: NA  Recommended Physical Activity Goals  Shaletha has been advised to work up to 150 minutes of moderate intensity aerobic activity a week and strengthening exercises 2-3 times per week for cardiovascular health, weight loss maintenance and preservation of muscle mass.   She has agreed to Start aerobic activity with a goal of 150 minutes a week at moderate intensity.  We discussed the importance of ramping up exercise to include more regular walking with a goal of 30 minutes daily the ultimate plan to increase resistance training to 2 days a week  Pharmacotherapy changes for the treatment of obesity: Increase  Ozempic  to 0.5 mg once weekly injection  ASSOCIATED CONDITIONS ADDRESSED TODAY  Controlled type 2 diabetes mellitus with complication, without long-term current use of insulin  (HCC) Lab Results  Component Value Date   HGBA1C 6.4 12/27/2023  Now that diarrhea has improved with reducing dose of Ozempic , will go back up to 0.5  mg once weekly injection.  She has failed to see much appetite control on the lowest dose.  Continue to work on improving food choices, watching food volumes at mealtime and avoiding high saturated fat/high sugar foods and drinks.  We discussed importance of ramping up regular exercise. Repeat chemistry panel, fasting insulin  and A1c in January -     Ozempic  (0.25 or 0.5 MG/DOSE); 0.5 mg [place] once weekly injection  Dispense: 3 mL; Refill: 1  Morbid obesity (HCC)  BMI 50.0-59.9, adult (HCC)  S/P laparoscopic sleeve gastrectomy She is working on improving her compliance on a post bariatric surgery diet.  I did remind her that we switched her from category 3 meal plan over to an AI created 1600-calorie low-carb meal plan due to her history of sleeve gastrectomy with a desire for a lower carb/higher protein diet.  Continue on a moderate multivitamin daily, B12 500 mcg daily and vitamin D  50,000 IU once weekly.     She was informed of the importance of frequent follow up visits to maximize her success with intensive lifestyle modifications for her multiple health conditions.   ATTESTASTION STATEMENTS:  Reviewed by clinician on day of visit: allergies, medications, problem list, medical history, surgical history, family history, social history, and previous encounter notes pertinent to obesity diagnosis.   I have personally spent 30 minutes total time today in preparation, patient care, nutritional counseling and education,  and documentation for this visit, including the following: review of most recent clinical lab tests, prescribing medications/ refilling medications, reviewing medical assistant documentation, review and interpretation of bioimpedence results.     Darice Haddock, D.O. DABFM, DABOM [facility] [address] [place], [place] [address] [phone]

## 2024-06-11 ENCOUNTER — Ambulatory Visit: Admitting: Family Medicine

## 2024-06-20 ENCOUNTER — Other Ambulatory Visit: Payer: Self-pay | Admitting: Family Medicine

## 2024-06-26 ENCOUNTER — Other Ambulatory Visit: Payer: Self-pay | Admitting: Family Medicine

## 2024-06-26 DIAGNOSIS — I10 Essential (primary) hypertension: Secondary | ICD-10-CM

## 2024-06-26 DIAGNOSIS — R002 Palpitations: Secondary | ICD-10-CM

## 2024-07-21 ENCOUNTER — Ambulatory Visit: Admitting: Family Medicine
